# Patient Record
Sex: Male | Born: 1965 | Race: Black or African American | Hispanic: No | Marital: Married | Smoking: Never smoker
Health system: Southern US, Community
[De-identification: ages and names within clinical notes are randomized; demographics above are authoritative.]

## PROBLEM LIST (undated history)

## (undated) DIAGNOSIS — T8859XA Other complications of anesthesia, initial encounter: Secondary | ICD-10-CM

## (undated) DIAGNOSIS — R011 Cardiac murmur, unspecified: Secondary | ICD-10-CM

## (undated) DIAGNOSIS — E1052 Type 1 diabetes mellitus with diabetic peripheral angiopathy with gangrene: Secondary | ICD-10-CM

## (undated) DIAGNOSIS — I10 Essential (primary) hypertension: Secondary | ICD-10-CM

## (undated) DIAGNOSIS — E119 Type 2 diabetes mellitus without complications: Secondary | ICD-10-CM

## (undated) DIAGNOSIS — T4145XA Adverse effect of unspecified anesthetic, initial encounter: Secondary | ICD-10-CM

## (undated) DIAGNOSIS — T8781 Dehiscence of amputation stump: Secondary | ICD-10-CM

## (undated) HISTORY — DX: Type 1 diabetes mellitus with diabetic peripheral angiopathy with gangrene: E10.52

---

## 2012-09-16 ENCOUNTER — Encounter (HOSPITAL_COMMUNITY): Payer: Self-pay | Admitting: *Deleted

## 2012-09-16 ENCOUNTER — Emergency Department (HOSPITAL_COMMUNITY)
Admission: EM | Admit: 2012-09-16 | Discharge: 2012-09-16 | Disposition: A | Discharge status: Home / Self Care | Payer: Self-pay | Attending: Emergency Medicine | Admitting: Emergency Medicine

## 2012-09-16 DIAGNOSIS — Z794 Long term (current) use of insulin: Secondary | ICD-10-CM | POA: Insufficient documentation

## 2012-09-16 DIAGNOSIS — E1169 Type 2 diabetes mellitus with other specified complication: Secondary | ICD-10-CM | POA: Insufficient documentation

## 2012-09-16 DIAGNOSIS — E162 Hypoglycemia, unspecified: Secondary | ICD-10-CM

## 2012-09-16 HISTORY — DX: Type 2 diabetes mellitus without complications: E11.9

## 2012-09-16 LAB — GLUCOSE, CAPILLARY
Glucose-Capillary: 92 mg/dL (ref 70–99)
Glucose-Capillary: 123 mg/dL — ABNORMAL HIGH (ref 70–99)

## 2012-09-16 MED ORDER — DEXTROSE 50 % IV SOLN
50.0000 mL | Freq: Once | INTRAVENOUS | Status: AC
  Administered 2012-09-16: 50 mL via INTRAVENOUS

## 2012-09-16 MED ORDER — DEXTROSE 50 % IV SOLN
INTRAVENOUS | Status: AC
  Administered 2012-09-16: 50 mL via INTRAVENOUS
  Filled 2012-09-16: qty 50

## 2012-09-16 NOTE — ED Provider Notes (Signed)
CSN: [id number]     Arrival date & time 09/16/12  1003 History  This chart was scribed for Ward Givens, MD,  by Ashley Jacobs, ED Scribe. The patient was seen in room APA02/APA02 and the patient's care was started at 11:13 AM   First MD Initiated Contact with Patient 09/16/12 1049     Chief Complaint  Patient presents with  . Hypoglycemia   (Consider location/radiation/quality/duration/timing/severity/associated sxs/prior Treatment) HPI. HPI Comments: Gordon Walker is a 47 y.o. male who presents to the Emergency Department complaining of hypoglycemia while at work today aftering being brought in by coworker. While at work pt states that his friend thought the pt seemed confused and he would only answer yes or no questions. Prior to going to work this morning he reports eating well and denies having any nausea, vomiting, diarrhea, or diaphoresis. He denies any recent changes in his appetite or having any other increase exertion. Today is his second day working at [employer] after spending 1.5 months unemployed. Pt's wife explains that the pt had a CBG of 90 two days ago which was resolved with eating. He denies drinking alcohol.  Pt has diabetes since he was 47 yo.    Pt's PCP is Dr. Lerry Liner, [address] [place] (Pt lives in [place], works in [employer])  Past Medical History  Diagnosis Date  . Diabetes mellitus without complication    History reviewed. No pertinent past surgical history. No family history on file. History  Substance Use Topics  . Smoking status: Never Smoker   . Smokeless tobacco: Not on file  . Alcohol Use: Not on file  employed  Review of Systems  Constitutional: Negative for fever, diaphoresis and appetite change.  Gastrointestinal: Negative for nausea, vomiting and diarrhea.  All other systems reviewed and are negative.    Allergies  Review of patient's allergies indicates no known allergies.  Home Medications   Current  Outpatient Rx  Name  Route  Sig  Dispense  Refill  . insulin glargine (LANTUS) 100 UNIT/ML injection   Subcutaneous   Inject 15-20 Units into the skin at bedtime. Sliding scale         . insulin NPH-regular (NOVOLIN 70/30) (70-30) 100 UNIT/ML injection   Subcutaneous   Inject 15-20 Units into the skin 2 (two) times daily with a meal. Patient thinks he used 17 units this morning         . Multiple Vitamin (MULTIVITAMIN WITH MINERALS) TABS tablet   Oral   Take 1 tablet by mouth daily.          BP 164/89  Pulse 78  Resp 14  SpO2 100%  Vital signs normal   Physical Exam  Nursing note and vitals reviewed. Constitutional: He is oriented to person, place, and time. He appears well-developed and well-nourished.  Non-toxic appearance. He does not appear ill. No distress.  HENT:  Head: Normocephalic and atraumatic.  Right Ear: External ear normal.  Left Ear: External ear normal.  Nose: Nose normal. No mucosal edema or rhinorrhea.  Mouth/Throat: Oropharynx is clear and moist and mucous membranes are normal. No dental abscesses or edematous.  Eyes: Conjunctivae and EOM are normal. Pupils are equal, round, and reactive to light.  Neck: Normal range of motion and full passive range of motion without pain. Neck supple.  Cardiovascular: Normal rate, regular rhythm and normal heart sounds.  Exam reveals no gallop and no friction rub.   No murmur heard. Pulmonary/Chest: Effort normal and  breath sounds normal. No respiratory distress. He has no rhonchi. He exhibits no crepitus.  Abdominal: Normal appearance. There is no rebound and no guarding.  Musculoskeletal: Normal range of motion. He exhibits no edema and no tenderness.  Moves all extremities well.   Neurological: He is alert and oriented to person, place, and time. He has normal strength. No cranial nerve deficit.  Skin: Skin is warm, dry and intact. No rash noted. No erythema. No pallor.  Psychiatric: He has a normal mood and  affect. His speech is normal and behavior is normal. His mood appears not anxious.    ED Course  Procedures (including critical care time) DIAGNOSTIC STUDIES: Oxygen Saturation is 100% on room air, normal by my interpretation.    COORDINATION OF CARE: 11:16 AM Discussed course of care with pt . Pt understands and agrees.  Pt ate minimal breakfast here, his repeat CBG was 92, wife brought him food and his CBG is now 123. He states he is feeling fine now.    Results for orders placed during the hospital encounter of 09/16/12  GLUCOSE, CAPILLARY      Result Value Range   Glucose-Capillary 92  70 - 99 mg/dL  GLUCOSE, CAPILLARY      Result Value Range   Glucose-Capillary 29 (*) 70 - 99 mg/dL   Comment 1 Orig Pt Id entered as [id number]    GLUCOSE, CAPILLARY      Result Value Range   Glucose-Capillary 123 (*) 70 - 99 mg/dL   Comment 1 Documented in Chart     Laboratory interpretation all normal except initial low glucose then normal then mildly elevated    MDM  patient reports she just started back to work this week after being off for 6 weeks. He reports rarely his blood sugar is low. More likely the increased activity has decreased his insulin need. Patient is being released to closely monitor his CBG to get it stabilized with his new level of activity while returning to work.    1. Hypoglycemia     Plan discharge   Devoria Albe, MD, FACEP   I personally performed the services described in this documentation, which was scribed in my presence. The recorded information has been reviewed and considered.  Devoria Albe, MD, Armando Gang      Ward Givens, MD 09/16/12 1245

## 2012-09-16 NOTE — ED Notes (Signed)
Reports took Novolog 70/30, 20 units and ate a "light breakfast."

## 2012-09-16 NOTE — ED Notes (Addendum)
Pt given discharge instructions and verbalized understanding. NAD at this time. Vitals reviewed and are WNL.

## 2012-09-16 NOTE — ED Notes (Signed)
Pt given breakfast tray and ate cereal only.  Encouraged pt to eat a little more.  Pt's wife will bring pt lunch.

## 2012-09-16 NOTE — ED Notes (Signed)
Pt brought to er by friend with c/o low blood sugar, CBG at triage 29, pt unable to answer questions, will only say yes to questions asked, pt clammy,

## 2015-01-05 ENCOUNTER — Inpatient Hospital Stay (HOSPITAL_COMMUNITY)
Admission: EM | Admit: 2015-01-05 | Discharge: 2015-01-10 | DRG: 617 | Disposition: A | Discharge status: Home / Self Care | Payer: BLUE CROSS/BLUE SHIELD | Attending: Internal Medicine | Admitting: Internal Medicine

## 2015-01-05 ENCOUNTER — Emergency Department (HOSPITAL_COMMUNITY): Payer: BLUE CROSS/BLUE SHIELD

## 2015-01-05 ENCOUNTER — Encounter (HOSPITAL_COMMUNITY): Payer: Self-pay | Admitting: Emergency Medicine

## 2015-01-05 DIAGNOSIS — E10621 Type 1 diabetes mellitus with foot ulcer: Secondary | ICD-10-CM | POA: Diagnosis present

## 2015-01-05 DIAGNOSIS — IMO0002 Reserved for concepts with insufficient information to code with codable children: Secondary | ICD-10-CM

## 2015-01-05 DIAGNOSIS — Z794 Long term (current) use of insulin: Secondary | ICD-10-CM | POA: Diagnosis not present

## 2015-01-05 DIAGNOSIS — R509 Fever, unspecified: Secondary | ICD-10-CM | POA: Diagnosis not present

## 2015-01-05 DIAGNOSIS — E11621 Type 2 diabetes mellitus with foot ulcer: Secondary | ICD-10-CM

## 2015-01-05 DIAGNOSIS — M86172 Other acute osteomyelitis, left ankle and foot: Secondary | ICD-10-CM | POA: Diagnosis present

## 2015-01-05 DIAGNOSIS — D649 Anemia, unspecified: Secondary | ICD-10-CM | POA: Diagnosis present

## 2015-01-05 DIAGNOSIS — E1065 Type 1 diabetes mellitus with hyperglycemia: Secondary | ICD-10-CM | POA: Diagnosis present

## 2015-01-05 DIAGNOSIS — L97529 Non-pressure chronic ulcer of other part of left foot with unspecified severity: Secondary | ICD-10-CM | POA: Diagnosis present

## 2015-01-05 DIAGNOSIS — L02612 Cutaneous abscess of left foot: Secondary | ICD-10-CM | POA: Diagnosis present

## 2015-01-05 DIAGNOSIS — E1051 Type 1 diabetes mellitus with diabetic peripheral angiopathy without gangrene: Secondary | ICD-10-CM | POA: Diagnosis present

## 2015-01-05 DIAGNOSIS — D72829 Elevated white blood cell count, unspecified: Secondary | ICD-10-CM | POA: Diagnosis present

## 2015-01-05 DIAGNOSIS — R739 Hyperglycemia, unspecified: Secondary | ICD-10-CM

## 2015-01-05 DIAGNOSIS — L97509 Non-pressure chronic ulcer of other part of unspecified foot with unspecified severity: Secondary | ICD-10-CM

## 2015-01-05 DIAGNOSIS — E1069 Type 1 diabetes mellitus with other specified complication: Secondary | ICD-10-CM | POA: Diagnosis present

## 2015-01-05 DIAGNOSIS — R740 Nonspecific elevation of levels of transaminase and lactic acid dehydrogenase [LDH]: Secondary | ICD-10-CM | POA: Diagnosis present

## 2015-01-05 DIAGNOSIS — E1042 Type 1 diabetes mellitus with diabetic polyneuropathy: Secondary | ICD-10-CM | POA: Diagnosis present

## 2015-01-05 DIAGNOSIS — R7401 Elevation of levels of liver transaminase levels: Secondary | ICD-10-CM | POA: Diagnosis not present

## 2015-01-05 DIAGNOSIS — E1165 Type 2 diabetes mellitus with hyperglycemia: Secondary | ICD-10-CM

## 2015-01-05 DIAGNOSIS — R944 Abnormal results of kidney function studies: Secondary | ICD-10-CM | POA: Diagnosis present

## 2015-01-05 DIAGNOSIS — M869 Osteomyelitis, unspecified: Secondary | ICD-10-CM

## 2015-01-05 DIAGNOSIS — E08621 Diabetes mellitus due to underlying condition with foot ulcer: Secondary | ICD-10-CM | POA: Diagnosis not present

## 2015-01-05 DIAGNOSIS — L089 Local infection of the skin and subcutaneous tissue, unspecified: Secondary | ICD-10-CM | POA: Diagnosis present

## 2015-01-05 DIAGNOSIS — D6489 Other specified anemias: Secondary | ICD-10-CM | POA: Diagnosis not present

## 2015-01-05 LAB — BASIC METABOLIC PANEL
ANION GAP: 11 (ref 5–15)
BUN: 21 mg/dL — ABNORMAL HIGH (ref 6–20)
CALCIUM: 8.4 mg/dL — AB (ref 8.9–10.3)
CO2: 27 mmol/L (ref 22–32)
Chloride: 89 mmol/L — ABNORMAL LOW (ref 101–111)
Creatinine, Ser: 1.46 mg/dL — ABNORMAL HIGH (ref 0.61–1.24)
GFR, EST NON AFRICAN AMERICAN: 56 mL/min — AB (ref >60)
Glucose, Bld: 552 mg/dL (ref 65–99)
Potassium: 4.7 mmol/L (ref 3.5–5.1)
SODIUM: 127 mmol/L — AB (ref 135–145)

## 2015-01-05 LAB — CBC WITH DIFFERENTIAL/PLATELET
Basophils Absolute: 0 10*3/uL (ref 0.0–0.1)
Basophils Relative: 0 %
EOS PCT: 0 %
Eosinophils Absolute: 0 10*3/uL (ref 0.0–0.7)
HCT: 31.3 % — ABNORMAL LOW (ref 39.0–52.0)
Hemoglobin: 10.8 g/dL — ABNORMAL LOW (ref 13.0–17.0)
LYMPHS ABS: 1 10*3/uL (ref 0.7–4.0)
LYMPHS PCT: 7 %
MCH: 28.4 pg (ref 26.0–34.0)
MCHC: 34.5 g/dL (ref 30.0–36.0)
MCV: 82.4 fL (ref 78.0–100.0)
MONO ABS: 1.9 10*3/uL — AB (ref 0.1–1.0)
Monocytes Relative: 12 %
Neutro Abs: 12.4 10*3/uL — ABNORMAL HIGH (ref 1.7–7.7)
Neutrophils Relative %: 81 %
PLATELETS: 332 10*3/uL (ref 150–400)
RBC: 3.8 MIL/uL — AB (ref 4.22–5.81)
RDW: 11.9 % (ref 11.5–15.5)
WBC: 15.3 10*3/uL — AB (ref 4.0–10.5)

## 2015-01-05 LAB — I-STAT CHEM 8, ED
BUN: 24 mg/dL — ABNORMAL HIGH (ref 6–20)
Calcium, Ion: 1.12 mmol/L (ref 1.12–1.23)
Chloride: 89 mmol/L — ABNORMAL LOW (ref 101–111)
Creatinine, Ser: 1.1 mg/dL (ref 0.61–1.24)
GLUCOSE: 548 mg/dL — AB (ref 65–99)
HEMATOCRIT: 35 % — AB (ref 39.0–52.0)
HEMOGLOBIN: 11.9 g/dL — AB (ref 13.0–17.0)
POTASSIUM: 4.6 mmol/L (ref 3.5–5.1)
Sodium: 129 mmol/L — ABNORMAL LOW (ref 135–145)
TCO2: 29 mmol/L (ref 0–100)

## 2015-01-05 LAB — I-STAT CG4 LACTIC ACID, ED
LACTIC ACID, VENOUS: 1.4 mmol/L (ref 0.5–2.0)
LACTIC ACID, VENOUS: 1 mmol/L (ref 0.5–2.0)

## 2015-01-05 MED ORDER — VANCOMYCIN HCL IN DEXTROSE 1-5 GM/200ML-% IV SOLN
1000.0000 mg | Freq: Once | INTRAVENOUS | Status: AC
  Administered 2015-01-05: 1000 mg via INTRAVENOUS
  Filled 2015-01-05: qty 200

## 2015-01-05 MED ORDER — INSULIN ASPART 100 UNIT/ML ~~LOC~~ SOLN
5.0000 [IU] | Freq: Once | SUBCUTANEOUS | Status: AC
  Administered 2015-01-05: 5 [IU] via SUBCUTANEOUS
  Filled 2015-01-05: qty 1

## 2015-01-05 MED ORDER — PIPERACILLIN-TAZOBACTAM 3.375 G IVPB 30 MIN
3.3750 g | Freq: Once | INTRAVENOUS | Status: AC
  Administered 2015-01-05: 3.375 g via INTRAVENOUS
  Filled 2015-01-05: qty 50

## 2015-01-05 NOTE — ED Notes (Signed)
Per EMS- pt here from Urgent Care. Pt was seen on the 5th for toe infection to left great toe. Redness and swelling to foot. Pt was told to soak his foot but returned today for worsening symptoms. Pt had a fever of 102.9 and was given Motrin 800mg  at UC. Pt CBG was 180.

## 2015-01-05 NOTE — ED Provider Notes (Signed)
CSN: [id number]     Arrival date & time 01/05/15  1910 History   First MD Initiated Contact with Patient 01/05/15 2005     Chief Complaint  Patient presents with  . toe infection    Patient gave verbal permission to utilize photo for medical documentation only The image was not stored on any personal device   Patient is a [age] y.o. male presenting with lower extremity pain. The history is provided by the patient and a relative.  Foot Pain This is a new problem. The current episode started more than 2 days ago. The problem occurs daily. The problem has been gradually worsening. Pertinent negatives include no chest pain, no abdominal pain and no shortness of breath. The symptoms are aggravated by walking. The symptoms are relieved by rest.  pt reports left foot/great toe pain for several weeks He does not recall any recent injury but reports he is diabetic He reports fever/chills (Tmax 103 per family) He reports pain/redness to left foot He is not on antibiotics He reports he was seen at an urgent care and today and sent for evaluation  Past Medical History  Diagnosis Date  . Diabetes mellitus without complication (HCC)    No past surgical history on file. No family history on file. Social History  Substance Use Topics  . Smoking status: Never Smoker   . Smokeless tobacco: None  . Alcohol Use: None    Review of Systems  Constitutional: Positive for fever and chills.  Respiratory: Negative for shortness of breath.   Cardiovascular: Negative for chest pain.  Gastrointestinal: Positive for nausea. Negative for abdominal pain.  Skin: Positive for wound.  All other systems reviewed and are negative.     Allergies  Review of patient's allergies indicates no known allergies.  Home Medications   Prior to Admission medications   Medication Sig Start Date End Date Taking? Authorizing Provider  insulin glargine (LANTUS) 100 UNIT/ML injection Inject 30 Units into the skin at  bedtime. Sliding scale   Yes Historical Provider, MD  insulin lispro (HUMALOG) 100 UNIT/ML injection Inject 5-7 Units into the skin See admin instructions. Takes with largest meal   Yes Historical Provider, MD  Multiple Vitamin (MULTIVITAMIN WITH MINERALS) TABS tablet Take 1 tablet by mouth daily.   Yes Historical Provider, MD   BP 169/89 mmHg  Pulse 85  Temp(Src) 98.8 F (37.1 C) (Oral)  Resp 15  SpO2 96% Physical Exam CONSTITUTIONAL: Well developed/well nourished HEAD: Normocephalic/atraumatic EYES: EOMI ENMT: Mucous membranes moist NECK: supple no meningeal signs CV: S1/S2 noted, no murmurs/rubs/gallops noted LUNGS: Lungs are clear to auscultation bilaterally, no apparent distress ABDOMEN: soft, nontender, no rebound or guarding GU:no cva tenderness NEURO: Pt is awake/alert/appropriate, moves all extremitiesx4.  No facial droop.   EXTREMITIES: pulses normal/equal, full ROM, see photo.  No crepitus noted SKIN: warm, color normal, see photos PSYCH: no abnormalities of mood noted, alert and oriented to situation      ED Course  Procedures  9:22 PM Pt with obvious infection He will need admission Xray pending at this time IV antibiotics have been ordered 10:19 PM I spoke to OR nurse for dr dean and he is aware of patient Will need medical admission 11:03 PM  D/w dr Robb Matarortiz Will admit Pt will need seen by orthopedics IV antibiotics have been given Pt stable and not septic appearing at this time Labs Review Labs Reviewed  CBC WITH DIFFERENTIAL/PLATELET - Abnormal; Notable for the following:    WBC 15.3 (*)  RBC 3.80 (*)    Hemoglobin 10.8 (*)    HCT 31.3 (*)    Neutro Abs 12.4 (*)    Monocytes Absolute 1.9 (*)    All other components within normal limits  BASIC METABOLIC PANEL - Abnormal; Notable for the following:    Sodium 127 (*)    Chloride 89 (*)    Glucose, Bld 552 (*)    BUN 21 (*)    Creatinine, Ser 1.46 (*)    Calcium 8.4 (*)    GFR calc non Af Amer  56 (*)    All other components within normal limits  I-STAT CHEM 8, ED - Abnormal; Notable for the following:    Sodium 129 (*)    Chloride 89 (*)    BUN 24 (*)    Glucose, Bld 548 (*)    Hemoglobin 11.9 (*)    HCT 35.0 (*)    All other components within normal limits  I-STAT CG4 LACTIC ACID, ED  I-STAT CG4 LACTIC ACID, ED    Imaging Review Dg Foot Complete Left  01/05/2015  CLINICAL DATA:  Left great toe infection.  Redness and swelling. EXAM: LEFT FOOT - COMPLETE 3+ VIEW COMPARISON:  None. FINDINGS: Soft tissue irregularity and gas about the first phalanx with diffuse soft tissue swelling. Vascular calcifications. Lucency through the proximal aspect of the distal phalanx of the great toe.absence of the medial portion the tuft of the great toe. Vascular calcifications. IMPRESSION: Soft tissue swelling with gas about the great toe, suspicious for infection, possibly with gas producing organism. Absence of the medial aspect of the tuft is suspicious for osteomyelitis. Lucency through the proximal portion of the distal phalanx could represent an atypical appearance of osteomyelitis or subacute fracture. Electronically Signed   By: Jeronimo Greaves M.D.   On: 01/05/2015 21:20   I have personally reviewed and evaluated these images and lab results as part of my medical decision-making.    MDM   Final diagnoses:  Acute osteomyelitis of left foot (HCC)  Hyperglycemia    Nursing notes including past medical history and social history reviewed and considered in documentation Labs/vital reviewed myself and considered during evaluation xrays/imaging reviewed by myself and considered during evaluation     Zadie Rhine, MD 01/05/15 2304

## 2015-01-05 NOTE — Progress Notes (Signed)
Great toe osteomyelitis xrays reviewed i will ask dr duda to eval in am Pt stable by report

## 2015-01-05 NOTE — ED Notes (Signed)
Lab callled, needs new green top (BMP drawal)

## 2015-01-05 NOTE — ED Notes (Signed)
Family at bedside. 

## 2015-01-06 ENCOUNTER — Inpatient Hospital Stay (HOSPITAL_COMMUNITY): Payer: BLUE CROSS/BLUE SHIELD

## 2015-01-06 ENCOUNTER — Other Ambulatory Visit (HOSPITAL_COMMUNITY): Payer: Self-pay | Admitting: Orthopedic Surgery

## 2015-01-06 ENCOUNTER — Encounter (HOSPITAL_COMMUNITY): Payer: Self-pay | Admitting: Internal Medicine

## 2015-01-06 DIAGNOSIS — D649 Anemia, unspecified: Secondary | ICD-10-CM | POA: Diagnosis present

## 2015-01-06 DIAGNOSIS — E1165 Type 2 diabetes mellitus with hyperglycemia: Secondary | ICD-10-CM

## 2015-01-06 DIAGNOSIS — M86172 Other acute osteomyelitis, left ankle and foot: Secondary | ICD-10-CM

## 2015-01-06 DIAGNOSIS — L97509 Non-pressure chronic ulcer of other part of unspecified foot with unspecified severity: Secondary | ICD-10-CM

## 2015-01-06 DIAGNOSIS — M869 Osteomyelitis, unspecified: Secondary | ICD-10-CM | POA: Diagnosis present

## 2015-01-06 DIAGNOSIS — Z794 Long term (current) use of insulin: Secondary | ICD-10-CM

## 2015-01-06 DIAGNOSIS — E11621 Type 2 diabetes mellitus with foot ulcer: Secondary | ICD-10-CM

## 2015-01-06 DIAGNOSIS — IMO0002 Reserved for concepts with insufficient information to code with codable children: Secondary | ICD-10-CM

## 2015-01-06 LAB — GLUCOSE, CAPILLARY
GLUCOSE-CAPILLARY: 194 mg/dL — AB (ref 65–99)
GLUCOSE-CAPILLARY: 203 mg/dL — AB (ref 65–99)
GLUCOSE-CAPILLARY: 251 mg/dL — AB (ref 65–99)
GLUCOSE-CAPILLARY: 311 mg/dL — AB (ref 65–99)
GLUCOSE-CAPILLARY: 466 mg/dL — AB (ref 65–99)
Glucose-Capillary: 253 mg/dL — ABNORMAL HIGH (ref 65–99)

## 2015-01-06 LAB — CBC WITH DIFFERENTIAL/PLATELET
BASOS ABS: 0 10*3/uL (ref 0.0–0.1)
Basophils Relative: 0 %
Eosinophils Absolute: 0.1 10*3/uL (ref 0.0–0.7)
Eosinophils Relative: 1 %
HEMATOCRIT: 31.5 % — AB (ref 39.0–52.0)
HEMOGLOBIN: 10.6 g/dL — AB (ref 13.0–17.0)
LYMPHS PCT: 10 %
Lymphs Abs: 1.2 10*3/uL (ref 0.7–4.0)
MCH: 28 pg (ref 26.0–34.0)
MCHC: 33.7 g/dL (ref 30.0–36.0)
MCV: 83.3 fL (ref 78.0–100.0)
MONO ABS: 1.1 10*3/uL — AB (ref 0.1–1.0)
MONOS PCT: 9 %
NEUTROS ABS: 9.3 10*3/uL — AB (ref 1.7–7.7)
NEUTROS PCT: 80 %
Platelets: 287 10*3/uL (ref 150–400)
RBC: 3.78 MIL/uL — ABNORMAL LOW (ref 4.22–5.81)
RDW: 11.7 % (ref 11.5–15.5)
WBC: 11.7 10*3/uL — ABNORMAL HIGH (ref 4.0–10.5)

## 2015-01-06 LAB — MAGNESIUM: MAGNESIUM: 2.2 mg/dL (ref 1.7–2.4)

## 2015-01-06 LAB — COMPREHENSIVE METABOLIC PANEL
ALBUMIN: 2.1 g/dL — AB (ref 3.5–5.0)
ALT: 32 U/L (ref 17–63)
ANION GAP: 6 (ref 5–15)
AST: 33 U/L (ref 15–41)
Alkaline Phosphatase: 146 U/L — ABNORMAL HIGH (ref 38–126)
BUN: 12 mg/dL (ref 6–20)
CO2: 31 mmol/L (ref 22–32)
Calcium: 8.4 mg/dL — ABNORMAL LOW (ref 8.9–10.3)
Chloride: 101 mmol/L (ref 101–111)
Creatinine, Ser: 1.01 mg/dL (ref 0.61–1.24)
GFR calc non Af Amer: >60 mL/min (ref >60)
GLUCOSE: 198 mg/dL — AB (ref 65–99)
POTASSIUM: 4.2 mmol/L (ref 3.5–5.1)
SODIUM: 138 mmol/L (ref 135–145)
TOTAL PROTEIN: 6.1 g/dL — AB (ref 6.5–8.1)
Total Bilirubin: 0.8 mg/dL (ref 0.3–1.2)

## 2015-01-06 LAB — PHOSPHORUS: PHOSPHORUS: 2.9 mg/dL (ref 2.5–4.6)

## 2015-01-06 MED ORDER — OXYCODONE HCL 5 MG PO TABS
5.0000 mg | ORAL_TABLET | ORAL | Status: DC | PRN
  Administered 2015-01-06 – 2015-01-09 (×7): 5 mg via ORAL
  Filled 2015-01-06 (×9): qty 1

## 2015-01-06 MED ORDER — ONDANSETRON HCL 4 MG PO TABS: 4.0000 mg | ORAL_TABLET | Freq: Four times a day (QID) | ORAL | Status: DC | PRN

## 2015-01-06 MED ORDER — ENOXAPARIN SODIUM 40 MG/0.4ML ~~LOC~~ SOLN
40.0000 mg | Freq: Every day | SUBCUTANEOUS | Status: DC
  Administered 2015-01-06 – 2015-01-09 (×4): 40 mg via SUBCUTANEOUS
  Filled 2015-01-06 (×4): qty 0.4

## 2015-01-06 MED ORDER — SODIUM CHLORIDE 0.9 % IV SOLN
INTRAVENOUS | Status: AC
  Administered 2015-01-06 (×2): via INTRAVENOUS

## 2015-01-06 MED ORDER — ONDANSETRON HCL 4 MG/2ML IJ SOLN: 4.0000 mg | Freq: Four times a day (QID) | INTRAMUSCULAR | Status: DC | PRN

## 2015-01-06 MED ORDER — ADULT MULTIVITAMIN W/MINERALS CH
1.0000 | ORAL_TABLET | Freq: Every day | ORAL | Status: DC
  Administered 2015-01-06 – 2015-01-10 (×4): 1 via ORAL
  Filled 2015-01-06 (×4): qty 1

## 2015-01-06 MED ORDER — PIPERACILLIN-TAZOBACTAM 3.375 G IVPB 30 MIN: 3.3750 g | Freq: Three times a day (TID) | INTRAVENOUS | Status: DC

## 2015-01-06 MED ORDER — ACETAMINOPHEN 325 MG PO TABS
650.0000 mg | ORAL_TABLET | Freq: Four times a day (QID) | ORAL | Status: DC | PRN
  Administered 2015-01-08 (×2): 650 mg via ORAL
  Filled 2015-01-06 (×2): qty 2

## 2015-01-06 MED ORDER — INSULIN ASPART 100 UNIT/ML ~~LOC~~ SOLN
10.0000 [IU] | Freq: Once | SUBCUTANEOUS | Status: AC
  Administered 2015-01-06: 10 [IU] via SUBCUTANEOUS

## 2015-01-06 MED ORDER — SODIUM CHLORIDE 0.9 % IV BOLUS (SEPSIS)
1000.0000 mL | Freq: Once | INTRAVENOUS | Status: AC
  Administered 2015-01-06: 1000 mL via INTRAVENOUS

## 2015-01-06 MED ORDER — VANCOMYCIN HCL IN DEXTROSE 1-5 GM/200ML-% IV SOLN
1000.0000 mg | Freq: Two times a day (BID) | INTRAVENOUS | Status: DC
  Administered 2015-01-06 – 2015-01-07 (×4): 1000 mg via INTRAVENOUS
  Filled 2015-01-06 (×4): qty 200

## 2015-01-06 MED ORDER — PIPERACILLIN-TAZOBACTAM 3.375 G IVPB
3.3750 g | Freq: Three times a day (TID) | INTRAVENOUS | Status: DC
  Administered 2015-01-06 – 2015-01-10 (×14): 3.375 g via INTRAVENOUS
  Filled 2015-01-06 (×15): qty 50

## 2015-01-06 MED ORDER — KETOROLAC TROMETHAMINE 30 MG/ML IJ SOLN
30.0000 mg | Freq: Three times a day (TID) | INTRAMUSCULAR | Status: DC | PRN
  Administered 2015-01-06: 30 mg via INTRAVENOUS
  Filled 2015-01-06: qty 1

## 2015-01-06 MED ORDER — INSULIN GLARGINE 100 UNIT/ML ~~LOC~~ SOLN
30.0000 [IU] | Freq: Every day | SUBCUTANEOUS | Status: DC
Start: 2015-01-06 — End: 2015-01-09
  Administered 2015-01-06 – 2015-01-08 (×4): 30 [IU] via SUBCUTANEOUS
  Filled 2015-01-06 (×6): qty 0.3

## 2015-01-06 MED ORDER — INSULIN ASPART 100 UNIT/ML ~~LOC~~ SOLN
0.0000 [IU] | Freq: Three times a day (TID) | SUBCUTANEOUS | Status: DC
  Administered 2015-01-06: 8 [IU] via SUBCUTANEOUS
  Administered 2015-01-06: 11 [IU] via SUBCUTANEOUS
  Administered 2015-01-06: 5 [IU] via SUBCUTANEOUS
  Administered 2015-01-07 (×2): 3 [IU] via SUBCUTANEOUS
  Administered 2015-01-07: 2 [IU] via SUBCUTANEOUS
  Administered 2015-01-08: 3 [IU] via SUBCUTANEOUS
  Administered 2015-01-08 (×2): 5 [IU] via SUBCUTANEOUS
  Administered 2015-01-09 (×2): 11 [IU] via SUBCUTANEOUS
  Administered 2015-01-09: 8 [IU] via SUBCUTANEOUS

## 2015-01-06 MED ORDER — PANTOPRAZOLE SODIUM 40 MG PO TBEC
40.0000 mg | DELAYED_RELEASE_TABLET | Freq: Every day | ORAL | Status: DC
  Administered 2015-01-06 – 2015-01-10 (×4): 40 mg via ORAL
  Filled 2015-01-06 (×6): qty 1

## 2015-01-06 MED ORDER — ACETAMINOPHEN 650 MG RE SUPP
650.0000 mg | Freq: Four times a day (QID) | RECTAL | Status: DC | PRN
Start: 2015-01-06 — End: 2015-01-10

## 2015-01-06 MED ORDER — PRO-STAT SUGAR FREE PO LIQD
30.0000 mL | Freq: Every day | ORAL | Status: DC
  Administered 2015-01-06 – 2015-01-10 (×4): 30 mL via ORAL
  Filled 2015-01-06 (×4): qty 30

## 2015-01-06 NOTE — Care Management Note (Signed)
Case Management Note  Patient Details  Name: Gordon Walker MRN: [id number] Date of Birth: 09-Mar-1965  Subjective/Objective:                    Action/Plan:  Will await post op evals Expected Discharge Date:                  Expected Discharge Plan:     In-House Referral:     Discharge planning Services     Post Acute Care Choice:    Choice offered to:     DME Arranged:    DME Agency:     HH Arranged:    HH Agency:     Status of Service:  In process, will continue to follow  Medicare Important Message Given:    Date Medicare IM Given:    Medicare IM give by:    Date Additional Medicare IM Given:    Additional Medicare Important Message give by:     If discussed at Long Length of Stay Meetings, dates discussed:    Additional Comments:  [name], [name], RN 01/06/2015, 10:51 AM

## 2015-01-06 NOTE — Progress Notes (Signed)
[facility] PROGRESS NOTE  Charletta CousinFrederick Botero ZOX:[id number] DOB: [birth year] DOA: 01/05/2015  PCP: [name], MD  Brief HPI: 49 year old African-American male with a past medical history of type 1 diabetes presented with complains of pain in his left foot, but clearly the left first toe. He was noted to have infection with suspicion for osteomyelitis on imaging studies. He was hospitalized for further management.  Past medical history:  Past Medical History  Diagnosis Date  . Diabetes mellitus without complication (HCC)     Consultants: Orthopedics  Procedures: None  Antibiotics: Vancomycin and Zosyn  Subjective: Patient states that the pain in his left foot is reasonably well controlled. He denies any nausea, vomiting currently. Did have a few episodes of vomiting yesterday.   Objective: Vital Signs  Filed Vitals:   01/05/15 2300 01/05/15 2345 01/06/15 0011 01/06/15 0506  BP: 155/90 140/80 142/81 134/81  Pulse: 88 85 89 75  Temp:   98.4 F (36.9 C) 97.4 F (36.3 C)  TempSrc:   Oral Oral  Resp: 23 23 20 18   Height:   6' (1.829 m)   Weight:   74.571 kg (164 lb 6.4 oz)   SpO2: 96% 98% 99% 100%    Intake/Output Summary (Last 24 hours) at 01/06/15 1307 Last data filed at 01/06/15 [id number]  Gross per 24 hour  Intake 1018.33 ml  Output    800 ml  Net 218.33 ml   Filed Weights   01/06/15 0011  Weight: 74.571 kg (164 lb 6.4 oz)    General appearance: alert, cooperative, appears stated age and no distress Resp: clear to auscultation bilaterally Cardio: regular rate and rhythm, S1, S2 normal, no murmur, click, rub or gallop GI: soft, non-tender; bowel sounds normal; no masses,  no organomegaly Extremities: Erythema noted over the dorsal aspect of the left foot. Left first toe is a mottled appearance with yellowish exudate. Foul-smelling. Tender to palpation. Pulses are poorly palpable. Neurologic: Alert and oriented 3. No focal neurological deficits are  noted.  Lab Results:  Basic Metabolic Panel:  Recent Labs Lab 01/05/15 2153 01/05/15 2228 01/06/15 0838  NA 127* 129* 138  K 4.7 4.6 4.2  CL 89* 89* 101  CO2 27  --  31  GLUCOSE 552* 548* 198*  BUN 21* 24* 12  CREATININE 1.46* 1.10 1.01  CALCIUM 8.4*  --  8.4*  MG 2.2  --   --   PHOS 2.9  --   --    Liver Function Tests:  Recent Labs Lab 01/06/15 0838  AST 33  ALT 32  ALKPHOS 146*  BILITOT 0.8  PROT 6.1*  ALBUMIN 2.1*   CBC:  Recent Labs Lab 01/05/15 1938 01/05/15 2228 01/06/15 0838  WBC 15.3*  --  11.7*  NEUTROABS 12.4*  --  9.3*  HGB 10.8* 11.9* 10.6*  HCT 31.3* 35.0* 31.5*  MCV 82.4  --  83.3  PLT 332  --  287    CBG:  Recent Labs Lab 01/06/15 0033 01/06/15 0459 01/06/15 0724 01/06/15 1115  GLUCAP 466* 253* 203* 251*    No results found for this or any previous visit (from the past 240 hour(s)).    Studies/Results: Dg Foot Complete Left  01/05/2015  CLINICAL DATA:  Left great toe infection.  Redness and swelling. EXAM: LEFT FOOT - COMPLETE 3+ VIEW COMPARISON:  None. FINDINGS: Soft tissue irregularity and gas about the first phalanx with diffuse soft tissue swelling. Vascular calcifications. Lucency through the proximal aspect of the distal  phalanx of the great toe.absence of the medial portion the tuft of the great toe. Vascular calcifications. IMPRESSION: Soft tissue swelling with gas about the great toe, suspicious for infection, possibly with gas producing organism. Absence of the medial aspect of the tuft is suspicious for osteomyelitis. Lucency through the proximal portion of the distal phalanx could represent an atypical appearance of osteomyelitis or subacute fracture. Electronically Signed   By: Jeronimo Greaves M.D.   On: 01/05/2015 21:20    Medications:  Scheduled: . enoxaparin (LOVENOX) injection  40 mg Subcutaneous QHS  . insulin aspart  0-15 Units Subcutaneous TID WC  . insulin glargine  30 Units Subcutaneous QHS  . pantoprazole   40 mg Oral Daily  . piperacillin-tazobactam (ZOSYN)  IV  3.375 g Intravenous 3 times per day  . vancomycin  1,000 mg Intravenous Q12H   Continuous: . sodium chloride 100 mL/hr at 01/06/15 0037   ZOX:[id number] **OR** acetaminophen, ketorolac, ondansetron **OR** ondansetron (ZOFRAN) IV, oxyCODONE  Assessment/Plan:  Principal Problem:   Osteomyelitis of left foot (HCC) Active Problems:   Uncontrolled diabetes mellitus with foot ulcer, with long-term current use of insulin (HCC)   Anemia    Infection of the left foot with osteomyelitis involving the left first toe Continue broad-spectrum antibiotics for now. Analgesics as needed. Obtain arterial Dopplers. Orthopedics is seen. Plan is for a ray amputation tomorrow.  Uncontrolled type 1 diabetes with foot ulcer Patient was hyperglycemic in the time of admission. Most likely due to infection. HbA1c is pending. He states that he is compliant with his diabetic regimen. Continue Lantus and sliding scale coverage.  Normocytic anemia Likely secondary to chronic disease. Check anemia panel.  Pseudohyponatremia Secondary to elevated blood glucose levels. Sodium level normal this morning.  DVT Prophylaxis: Lovenox    Code Status: Full code  Family Communication: Discussed with the patient and his wife  Disposition Plan: Continue current treatment. Plan is for surgical intervention tomorrow. Await arterial Dopplers.    LOS: 1 day   [facility]  [facility] Pager [phone] 01/06/2015, 1:07 PM  If 7PM-7AM, please contact night-coverage at [email or web address], password [facility]

## 2015-01-06 NOTE — Progress Notes (Signed)
Initial Nutrition Assessment  DOCUMENTATION CODES:   Not applicable  INTERVENTION:   -30 ml Prostat daily -MVI daily  NUTRITION DIAGNOSIS:   Increased nutrient needs related to wound healing as evidenced by estimated needs.  GOAL:   Patient will meet greater than or equal to 90% of their needs  MONITOR:   PO intake, Supplement acceptance, Labs, Weight trends, Skin, I & O's  REASON FOR ASSESSMENT:   Malnutrition Screening Tool    ASSESSMENT:   Gordon Walker is a 49 y.o. male with a past medical history of type 1 diabetes since age 49 who comes to the ER via EMS referred from an urgent care facility due to the left great toe infection. Per patient, about 2 weeks ago he noticed that he had a small tender area on his great toe, which continued to get progressively worse with development of tenderness, erythema and edema. Per patient, the last 2 days he has been having fever, chills and fatigue at home that he decided to seek medical attention. He denies any type of trauma to the area that he can remember. Workup in the ER reveals mild leukocytosis and a blood glucose of 550 mg/dL  Pt admitted with osteomyelitis of lt foot. Reviewed orthopedics notes, which reveals pt is scheduled for lt great toe amputation on 01/07/15.   Spoke with pt at bedside, who reports poor appetite 3 days PTA due to not feeling. However, prior to this and currently, pt has had a good appetite. He describes that he eats 3 meals per day and consumed all of his breakfast this AM, This statement was confirmed by RN.   Pt reports that he doesn't feel like he has lost weight, but became concerned of admission wt of 164# on admission. He reveals UBW of 174#. Weight verified on bedscale, which yielded 175#.   Pt endorses good glycemic control at home. Reports he check his blood sugar twice a day and generally achieve readings between 150-175. However, he noted that readings have been higher recently, but his  insulin regimen was recently adjusted. Per MD notes, Hgb A1c pending.   Discussed importance of good PO intake and glycemic control to support healing. Pt denied any further nutritional concerns, however, expressed appreciation for RD visit.   Nutrition-Focused physical exam completed. Findings are no fat depletion, no muscle depletion, and no edema.   Labs reviewed: CBGS: 203-456.  Diet Order:  Diet Heart Room service appropriate?: Yes; Fluid consistency:: Thin  Skin:  Wound (see comment) (necrotic appearing great lt toe)  Last BM:  PTA  Height:   Ht Readings from Last 1 Encounters:  01/06/15 6' (1.829 m)    Weight:   Wt Readings from Last 1 Encounters:  01/06/15 164 lb 6.4 oz (74.571 kg)    Ideal Body Weight:  80.9 kg  BMI:  Body mass index is 22.29 kg/(m^2).  Estimated Nutritional Needs:   Kcal:  2000-2200  Protein:  95-110 grams  Fluid:  2.0-2.2 L  EDUCATION NEEDS:   Education needs addressed  Gordon Wilborn A. Mayford KnifeWilliams, RD, LDN, CDE Pager: [phone] After hours Pager: [phone]

## 2015-01-06 NOTE — H&P (Signed)
[facility] History and Physical  Gordon Walker ZOX:[id number] DOB: Jan 02, 1966 DOA: 01/05/2015  Referring physician: Zadie Rhineonald Wickline, M.D. PCP: [name], MD   Chief Complaint: Toe infection.  HPI: Gordon CousinFrederick Mennen is a 49 y.o. male with a past medical history of type 1 diabetes since age 49 who comes to the ER via EMS referred from an urgent care facility due to the left great toe infection. Per patient, about 2 weeks ago he noticed that he had a small tender area on his great toe, which continued to get progressively worse with development of tenderness, erythema and edema. Per patient, the last 2 days he has been having fever, chills and fatigue at home that he decided to seek medical attention. He denies any type of trauma to the area that he can remember. Workup in the ER reveals mild leukocytosis and a blood glucose of 550 mg/dL   Review of Systems:  Constitutional:  Positive Fevers, chills, fatigue.  No weight loss, night sweats,  HEENT:  No headaches, Difficulty swallowing,Tooth/dental problems,Sore throat,  No sneezing, itching, ear ache, nasal congestion, post nasal drip,  Cardio-vascular:  No chest pain, Orthopnea, PND, swelling in lower extremities, anasarca, dizziness, palpitations  GI:  No heartburn, indigestion, abdominal pain, nausea, vomiting, diarrhea, change in bowel habits, loss of appetite  Resp:  No shortness of breath with exertion or at rest. No excess mucus, no productive cough, No non-productive cough, No coughing up of blood.No change in color of mucus.No wheezing.No chest wall deformity  Skin:  no rash or lesions.  GU:  no dysuria, change in color of urine, no urgency or frequency. No flank pain.  Musculoskeletal:  Left great toe infection. Psych:  No change in mood or affect. No depression or anxiety. No memory loss.   Past Medical History  Diagnosis Date  . Diabetes mellitus without complication (HCC)    History reviewed. No  pertinent past surgical history. Social History:  reports that he has never smoked. He does not have any smokeless tobacco history on file. His alcohol and drug histories are not on file.  No Known Allergies  History reviewed. No pertinent family history.   Prior to Admission medications   Medication Sig Start Date End Date Taking? Authorizing Provider  insulin glargine (LANTUS) 100 UNIT/ML injection Inject 30 Units into the skin at bedtime. Sliding scale   Yes Historical Provider, MD  insulin lispro (HUMALOG) 100 UNIT/ML injection Inject 5-7 Units into the skin See admin instructions. Takes with largest meal   Yes Historical Provider, MD  Multiple Vitamin (MULTIVITAMIN WITH MINERALS) TABS tablet Take 1 tablet by mouth daily.   Yes Historical Provider, MD   Physical Exam: Filed Vitals:   01/05/15 2215 01/05/15 2300 01/05/15 2345 01/06/15 0011  BP: 152/90 155/90 140/80 142/81  Pulse: 87 88 85 89  Temp:    98.4 F (36.9 C)  TempSrc:    Oral  Resp: 16 23 23 20   Height:    6' (1.829 m)  Weight:    74.571 kg (164 lb 6.4 oz)  SpO2: 99% 96% 98% 99%    Wt Readings from Last 3 Encounters:  01/06/15 74.571 kg (164 lb 6.4 oz)    General:  Appears calm and comfortable Eyes: PERRL, normal lids, irises & conjunctiva ENT: grossly normal hearing, lips & tongue Neck: no LAD, masses or thyromegaly Cardiovascular: RRR, no m/r/g. No LE edema. Telemetry: SR, no arrhythmias  Respiratory: CTA bilaterally, no w/r/r. Normal respiratory effort. Abdomen: soft, ntnd  Skin: no rash or induration seen on limited exam Musculoskeletal: Left foot great toe shows an ulcer on the dorsal aspect is with a surrounding tender area of edema and erythema. See pictures below Psychiatric: grossly normal mood and affect, speech fluent and appropriate Neurologic: grossly non-focal.          Labs on Admission:  Basic Metabolic Panel:  Recent Labs Lab 01/05/15 2153 01/05/15 2228  NA 127* 129*  K 4.7 4.6  CL  89* 89*  CO2 27  --   GLUCOSE 552* 548*  BUN 21* 24*  CREATININE 1.46* 1.10  CALCIUM 8.4*  --    CBC:  Recent Labs Lab 01/05/15 1938 01/05/15 2228  WBC 15.3*  --   NEUTROABS 12.4*  --   HGB 10.8* 11.9*  HCT 31.3* 35.0*  MCV 82.4  --   PLT 332  --     Radiological Exams on Admission: Dg Foot Complete Left  01/05/2015  CLINICAL DATA:  Left great toe infection.  Redness and swelling. EXAM: LEFT FOOT - COMPLETE 3+ VIEW COMPARISON:  None. FINDINGS: Soft tissue irregularity and gas about the first phalanx with diffuse soft tissue swelling. Vascular calcifications. Lucency through the proximal aspect of the distal phalanx of the great toe.absence of the medial portion the tuft of the great toe. Vascular calcifications. IMPRESSION: Soft tissue swelling with gas about the great toe, suspicious for infection, possibly with gas producing organism. Absence of the medial aspect of the tuft is suspicious for osteomyelitis. Lucency through the proximal portion of the distal phalanx could represent an atypical appearance of osteomyelitis or subacute fracture. Electronically Signed   By: Jeronimo Greaves M.D.   On: 01/05/2015 21:20      Assessment/Plan Principal Problem:   Osteomyelitis of left foot (HCC) Admit to MedSurg. Continue IV vancomycin and IV Zosyn. Analgesics as needed. Consider checking bone scan or MRI of foot.  Active Problems:   Uncontrolled diabetes mellitus with foot ulcer, with long-term current use of insulin (HCC) No nausea or vomiting. No decreased CO2 or anion gap on blood chemistry. Continue IV fluids. Continue treatment with insulin.    Anemia Check anemia workup. Monitor H&H.  Orthopedic surgery was consulted by the emergency department.  Code Status: Full code. DVT Prophylaxis: Lovenox SQ. Family Communication:  Disposition Plan: Admit for IV antibiotic therapy, blood glucose control.  Time spent: Over 70 minutes were spent during the process of this  admission.  Bobette Mo [facility] Pager [phone].

## 2015-01-06 NOTE — Progress Notes (Signed)
Inpatient Diabetes Program Recommendations  AACE/ADA: New Consensus Statement on Inpatient Glycemic Control (2015)  Target Ranges:  Prepandial:   less than 140 mg/dL      Peak postprandial:   less than 180 mg/dL (1-2 hours)      Critically ill patients:  140 - 180 mg/dL   Review of Glycemic ControlResults for Gordon CousinRANDALL, [name] (MRN [id number]) as of 01/06/2015 13:15  Ref. Range 01/06/2015 00:33 01/06/2015 04:59 01/06/2015 07:24 01/06/2015 11:15  Glucose-Capillary Latest Ref Range: 65-99 mg/dL 829466 (H) 562253 (H) 130203 (H) 251 (H)    Diabetes history: Type 1 diabetes Outpatient Diabetes medications: Lantus 30 units q HS, Humalog 5-7 units tid with meals Current orders for Inpatient glycemic control:  Lantus 30 units q HS, Novolog moderate tid with meals  Inpatient Diabetes Program Recommendations:    Please consider adding Novolog 4 units tid with meals (meal coverage)-Hold if patient eats less than 50%.  Thanks,  Beryl MeagerJenny Hosey Burmester, RN, BC-ADM Inpatient Diabetes Coordinator Pager [phone]

## 2015-01-06 NOTE — Progress Notes (Signed)
VASCULAR LAB PRELIMINARY  ARTERIAL  ABI completed: ABIs suggest probable calcification of vessels.    RIGHT    LEFT    PRESSURE WAVEFORM  PRESSURE WAVEFORM  BRACHIAL 136 T BRACHIAL 146 T  DP 210 T DP 169 T  AT   AT    PT 144 T PT 259 T  PER   PER    GREAT TOE  NA GREAT TOE  NA    RIGHT LEFT  ABI 1.4 1.7     Theone Bowell, RVT 01/06/2015, 3:26 PM

## 2015-01-06 NOTE — Consult Note (Signed)
Reason for Consult: Osteomyelitis ulceration left great toe Referring Physician: Dr Ledon Snare is an 49 y.o. male.  HPI: Patient is a 49 year old gentleman type I diabetic who has had 2 week history of ulceration and drainage from the left great toe he has had a prolonged course of swelling.  Past Medical History  Diagnosis Date  . Diabetes mellitus without complication ([place])     History reviewed. No pertinent past surgical history.  History reviewed. No pertinent family history.  Social History:  reports that he has never smoked. He does not have any smokeless tobacco history on file. His alcohol and drug histories are not on file.  Allergies: No Known Allergies  Medications: I have reviewed the patient's current medications.  Results for orders placed or performed during the hospital encounter of 01/05/15 (from the past 48 hour(s))  CBC with Differential     Status: Abnormal   Collection Time: 01/05/15  7:38 PM  Result Value Ref Range   WBC 15.3 (H) 4.0 - 10.5 K/uL   RBC 3.80 (L) 4.22 - 5.81 MIL/uL   Hemoglobin 10.8 (L) 13.0 - 17.0 g/dL   HCT 31.3 (L) 39.0 - 52.0 %   MCV 82.4 78.0 - 100.0 fL   MCH 28.4 26.0 - 34.0 pg   MCHC 34.5 30.0 - 36.0 g/dL   RDW 11.9 11.5 - 15.5 %   Platelets 332 150 - 400 K/uL   Neutrophils Relative % 81 %   Neutro Abs 12.4 (H) 1.7 - 7.7 K/uL   Lymphocytes Relative 7 %   Lymphs Abs 1.0 0.7 - 4.0 K/uL   Monocytes Relative 12 %   Monocytes Absolute 1.9 (H) 0.1 - 1.0 K/uL   Eosinophils Relative 0 %   Eosinophils Absolute 0.0 0.0 - 0.7 K/uL   Basophils Relative 0 %   Basophils Absolute 0.0 0.0 - 0.1 K/uL  I-Stat CG4 Lactic Acid, ED     Status: None   Collection Time: 01/05/15  7:55 PM  Result Value Ref Range   Lactic Acid, Venous 1.40 0.5 - 2.0 mmol/L  Basic metabolic panel     Status: Abnormal   Collection Time: 01/05/15  9:53 PM  Result Value Ref Range   Sodium 127 (L) 135 - 145 mmol/L   Potassium 4.7 3.5 - 5.1 mmol/L    Chloride 89 (L) 101 - 111 mmol/L   CO2 27 22 - 32 mmol/L   Glucose, Bld 552 (HH) 65 - 99 mg/dL    Comment: CRITICAL RESULT CALLED TO, READ BACK BY AND VERIFIED WITH: REABOLD,A RN 01/05/2015 2254 JORDANS    BUN 21 (H) 6 - 20 mg/dL   Creatinine, Ser 1.46 (H) 0.61 - 1.24 mg/dL   Calcium 8.4 (L) 8.9 - 10.3 mg/dL   GFR calc non Af Amer 56 (L) >60 mL/min   GFR calc Af Amer >60 >60 mL/min    Comment: (NOTE) The eGFR has been calculated using the CKD EPI equation. This calculation has not been validated in all clinical situations. eGFR's persistently <60 mL/min signify possible Chronic Kidney Disease.    Anion gap 11 5 - 15  Magnesium     Status: None   Collection Time: 01/05/15  9:53 PM  Result Value Ref Range   Magnesium 2.2 1.7 - 2.4 mg/dL  Phosphorus     Status: None   Collection Time: 01/05/15  9:53 PM  Result Value Ref Range   Phosphorus 2.9 2.5 - 4.6 mg/dL  I-stat chem 8, ed  Status: Abnormal   Collection Time: 01/05/15 10:28 PM  Result Value Ref Range   Sodium 129 (L) 135 - 145 mmol/L   Potassium 4.6 3.5 - 5.1 mmol/L   Chloride 89 (L) 101 - 111 mmol/L   BUN 24 (H) 6 - 20 mg/dL   Creatinine, Ser 1.10 0.61 - 1.24 mg/dL   Glucose, Bld 548 (H) 65 - 99 mg/dL   Calcium, Ion 1.12 1.12 - 1.23 mmol/L   TCO2 29 0 - 100 mmol/L   Hemoglobin 11.9 (L) 13.0 - 17.0 g/dL   HCT 35.0 (L) 39.0 - 52.0 %  I-Stat CG4 Lactic Acid, ED     Status: None   Collection Time: 01/05/15 10:30 PM  Result Value Ref Range   Lactic Acid, Venous 1.00 0.5 - 2.0 mmol/L  Glucose, capillary     Status: Abnormal   Collection Time: 01/06/15 12:33 AM  Result Value Ref Range   Glucose-Capillary 466 (H) 65 - 99 mg/dL   Comment 1 Notify RN   Glucose, capillary     Status: Abnormal   Collection Time: 01/06/15  4:59 AM  Result Value Ref Range   Glucose-Capillary 253 (H) 65 - 99 mg/dL   Comment 1 Notify RN   Glucose, capillary     Status: Abnormal   Collection Time: 01/06/15  7:24 AM  Result Value Ref Range    Glucose-Capillary 203 (H) 65 - 99 mg/dL    Dg Foot Complete Left  01/05/2015  CLINICAL DATA:  Left great toe infection.  Redness and swelling. EXAM: LEFT FOOT - COMPLETE 3+ VIEW COMPARISON:  None. FINDINGS: Soft tissue irregularity and gas about the first phalanx with diffuse soft tissue swelling. Vascular calcifications. Lucency through the proximal aspect of the distal phalanx of the great toe.absence of the medial portion the tuft of the great toe. Vascular calcifications. IMPRESSION: Soft tissue swelling with gas about the great toe, suspicious for infection, possibly with gas producing organism. Absence of the medial aspect of the tuft is suspicious for osteomyelitis. Lucency through the proximal portion of the distal phalanx could represent an atypical appearance of osteomyelitis or subacute fracture. Electronically Signed   By: Abigail Miyamoto M.D.   On: 01/05/2015 21:20    Review of Systems  All other systems reviewed and are negative.  Blood pressure 134/81, pulse 75, temperature 97.4 F (36.3 C), temperature source Oral, resp. rate 18, height 6' (1.829 m), weight 74.571 kg (164 lb 6.4 oz), SpO2 100 %. Physical Exam On examination patient is alert oriented 9 of the normal affect normal respiratory effort. Patient has no venous stasis ulcers in either leg. He has sausage digit swelling of the left great toe he has insensate neuropathy he has a palpable pulse which is ready. The great toe was swollen with ulceration and drainage. Radiographs reviewed which shows calcification of the digital vessels as well as chronic osteomyelitis of the left great toe. Assessment/Plan: Assessment: Type I diabetic insensate neuropathy peripheral vascular disease with osteomyelitis of the left great toe ulceration and drainage.  Plan: We'll plan for a left great toe first ray amputation. Risk and benefits were discussed including risk of the wound not healing. Discussed that if we are having difficulty with  healing we would need to get a vascular consult. For surgery tomorrow afternoon.  Elizette Shek V 01/06/2015, 7:54 AM

## 2015-01-07 ENCOUNTER — Inpatient Hospital Stay (HOSPITAL_COMMUNITY): Payer: BLUE CROSS/BLUE SHIELD | Admitting: Anesthesiology

## 2015-01-07 ENCOUNTER — Encounter (HOSPITAL_COMMUNITY)
Admission: EM | Disposition: A | Discharge status: Home / Self Care | Payer: Self-pay | Source: Home / Self Care | Attending: Internal Medicine

## 2015-01-07 DIAGNOSIS — D6489 Other specified anemias: Secondary | ICD-10-CM

## 2015-01-07 DIAGNOSIS — L97509 Non-pressure chronic ulcer of other part of unspecified foot with unspecified severity: Secondary | ICD-10-CM

## 2015-01-07 DIAGNOSIS — R7401 Elevation of levels of liver transaminase levels: Secondary | ICD-10-CM | POA: Diagnosis not present

## 2015-01-07 DIAGNOSIS — E08621 Diabetes mellitus due to underlying condition with foot ulcer: Secondary | ICD-10-CM

## 2015-01-07 DIAGNOSIS — R740 Nonspecific elevation of levels of transaminase and lactic acid dehydrogenase [LDH]: Secondary | ICD-10-CM

## 2015-01-07 DIAGNOSIS — E0865 Diabetes mellitus due to underlying condition with hyperglycemia: Secondary | ICD-10-CM

## 2015-01-07 DIAGNOSIS — Z794 Long term (current) use of insulin: Secondary | ICD-10-CM

## 2015-01-07 HISTORY — PX: AMPUTATION: SHX166

## 2015-01-07 LAB — HEMOGLOBIN A1C
Hgb A1c MFr Bld: 13.5 % — ABNORMAL HIGH (ref 4.8–5.6)
Mean Plasma Glucose: 341 mg/dL

## 2015-01-07 LAB — RETICULOCYTES
RBC.: 3.44 MIL/uL — AB (ref 4.22–5.81)
RETIC COUNT ABSOLUTE: 24.1 10*3/uL (ref 19.0–186.0)
RETIC CT PCT: 0.7 % (ref 0.4–3.1)

## 2015-01-07 LAB — GLUCOSE, CAPILLARY
GLUCOSE-CAPILLARY: 150 mg/dL — AB (ref 65–99)
GLUCOSE-CAPILLARY: 354 mg/dL — AB (ref 65–99)
Glucose-Capillary: 179 mg/dL — ABNORMAL HIGH (ref 65–99)
Glucose-Capillary: 142 mg/dL — ABNORMAL HIGH (ref 65–99)
Glucose-Capillary: 171 mg/dL — ABNORMAL HIGH (ref 65–99)
Glucose-Capillary: 148 mg/dL — ABNORMAL HIGH (ref 65–99)

## 2015-01-07 LAB — COMPREHENSIVE METABOLIC PANEL
ALK PHOS: 223 U/L — AB (ref 38–126)
ALT: 72 U/L — AB (ref 17–63)
AST: 145 U/L — ABNORMAL HIGH (ref 15–41)
Albumin: 1.8 g/dL — ABNORMAL LOW (ref 3.5–5.0)
Anion gap: 6 (ref 5–15)
BUN: 12 mg/dL (ref 6–20)
CALCIUM: 8 mg/dL — AB (ref 8.9–10.3)
CO2: 28 mmol/L (ref 22–32)
CREATININE: 1.12 mg/dL (ref 0.61–1.24)
Chloride: 99 mmol/L — ABNORMAL LOW (ref 101–111)
GFR calc non Af Amer: >60 mL/min (ref >60)
GLUCOSE: 206 mg/dL — AB (ref 65–99)
Potassium: 3.9 mmol/L (ref 3.5–5.1)
SODIUM: 133 mmol/L — AB (ref 135–145)
Total Bilirubin: 0.8 mg/dL (ref 0.3–1.2)
Total Protein: 6.2 g/dL — ABNORMAL LOW (ref 6.5–8.1)

## 2015-01-07 LAB — IRON AND TIBC
Iron: 31 ug/dL — ABNORMAL LOW (ref 45–182)
Saturation Ratios: 30 % (ref 17.9–39.5)
TIBC: 102 ug/dL — ABNORMAL LOW (ref 250–450)
UIBC: 71 ug/dL

## 2015-01-07 LAB — CBC WITH DIFFERENTIAL/PLATELET
BASOS PCT: 0 %
Basophils Absolute: 0 10*3/uL (ref 0.0–0.1)
EOS ABS: 0.1 10*3/uL (ref 0.0–0.7)
EOS PCT: 0 %
HCT: 29.2 % — ABNORMAL LOW (ref 39.0–52.0)
Hemoglobin: 9.9 g/dL — ABNORMAL LOW (ref 13.0–17.0)
Lymphocytes Relative: 14 %
Lymphs Abs: 1.6 10*3/uL (ref 0.7–4.0)
MCH: 28.8 pg (ref 26.0–34.0)
MCHC: 33.9 g/dL (ref 30.0–36.0)
MCV: 84.9 fL (ref 78.0–100.0)
MONO ABS: 1.2 10*3/uL — AB (ref 0.1–1.0)
MONOS PCT: 11 %
NEUTROS PCT: 75 %
Neutro Abs: 8.5 10*3/uL — ABNORMAL HIGH (ref 1.7–7.7)
PLATELETS: 289 10*3/uL (ref 150–400)
RBC: 3.44 MIL/uL — ABNORMAL LOW (ref 4.22–5.81)
RDW: 12 % (ref 11.5–15.5)
WBC: 11.3 10*3/uL — ABNORMAL HIGH (ref 4.0–10.5)

## 2015-01-07 LAB — SURGICAL PCR SCREEN
MRSA, PCR: NEGATIVE
STAPHYLOCOCCUS AUREUS: POSITIVE — AB

## 2015-01-07 LAB — FERRITIN: Ferritin: 387 ng/mL — ABNORMAL HIGH (ref 24–336)

## 2015-01-07 LAB — VANCOMYCIN, TROUGH: VANCOMYCIN TR: 10 ug/mL (ref 10.0–20.0)

## 2015-01-07 LAB — VITAMIN B12: VITAMIN B 12: 2926 pg/mL — AB (ref 180–914)

## 2015-01-07 LAB — FOLATE: FOLATE: 18.5 ng/mL (ref >5.9)

## 2015-01-07 SURGERY — AMPUTATION, FOOT, RAY
Anesthesia: General | Laterality: Left

## 2015-01-07 MED ORDER — METOCLOPRAMIDE HCL 5 MG PO TABS: 5.0000 mg | ORAL_TABLET | Freq: Three times a day (TID) | ORAL | Status: DC | PRN

## 2015-01-07 MED ORDER — ACETAMINOPHEN 325 MG PO TABS: 650.0000 mg | ORAL_TABLET | Freq: Four times a day (QID) | ORAL | Status: DC | PRN

## 2015-01-07 MED ORDER — ONDANSETRON HCL 4 MG PO TABS: 4.0000 mg | ORAL_TABLET | Freq: Four times a day (QID) | ORAL | Status: DC | PRN

## 2015-01-07 MED ORDER — HYDROMORPHONE HCL 1 MG/ML IJ SOLN
0.2500 mg | INTRAMUSCULAR | Status: DC | PRN
Start: 2015-01-07 — End: 2015-01-07
  Administered 2015-01-07 (×2): 0.5 mg via INTRAVENOUS

## 2015-01-07 MED ORDER — METHOCARBAMOL 500 MG PO TABS
500.0000 mg | ORAL_TABLET | Freq: Four times a day (QID) | ORAL | Status: DC | PRN
  Administered 2015-01-07 – 2015-01-09 (×4): 500 mg via ORAL
  Filled 2015-01-07 (×4): qty 1

## 2015-01-07 MED ORDER — PROPOFOL 10 MG/ML IV BOLUS
INTRAVENOUS | Status: DC | PRN
  Administered 2015-01-07: 150 mg via INTRAVENOUS

## 2015-01-07 MED ORDER — MIDAZOLAM HCL 2 MG/2ML IJ SOLN
INTRAMUSCULAR | Status: AC
  Filled 2015-01-07: qty 2

## 2015-01-07 MED ORDER — LACTATED RINGERS IV SOLN
INTRAVENOUS | Status: DC | PRN
  Administered 2015-01-07: 14:00:00 via INTRAVENOUS

## 2015-01-07 MED ORDER — ACETAMINOPHEN 650 MG RE SUPP: 650.0000 mg | Freq: Four times a day (QID) | RECTAL | Status: DC | PRN

## 2015-01-07 MED ORDER — ONDANSETRON HCL 4 MG/2ML IJ SOLN
INTRAMUSCULAR | Status: DC | PRN
  Administered 2015-01-07: 4 mg via INTRAVENOUS

## 2015-01-07 MED ORDER — SODIUM CHLORIDE 0.9 % IV SOLN
INTRAVENOUS | Status: DC
  Administered 2015-01-07: 18:00:00 via INTRAVENOUS

## 2015-01-07 MED ORDER — 0.9 % SODIUM CHLORIDE (POUR BTL) OPTIME
TOPICAL | Status: DC | PRN
  Administered 2015-01-07: 1000 mL

## 2015-01-07 MED ORDER — METHOCARBAMOL 1000 MG/10ML IJ SOLN
500.0000 mg | Freq: Four times a day (QID) | INTRAVENOUS | Status: DC | PRN
  Filled 2015-01-07: qty 5

## 2015-01-07 MED ORDER — PROPOFOL 10 MG/ML IV BOLUS
INTRAVENOUS | Status: AC
  Filled 2015-01-07: qty 20

## 2015-01-07 MED ORDER — VANCOMYCIN HCL IN DEXTROSE 750-5 MG/150ML-% IV SOLN
750.0000 mg | Freq: Three times a day (TID) | INTRAVENOUS | Status: DC
Start: 2015-01-07 — End: 2015-01-10
  Administered 2015-01-08 – 2015-01-10 (×7): 750 mg via INTRAVENOUS
  Filled 2015-01-07 (×9): qty 150

## 2015-01-07 MED ORDER — HYDROMORPHONE HCL 1 MG/ML IJ SOLN
INTRAMUSCULAR | Status: AC
  Filled 2015-01-07: qty 1

## 2015-01-07 MED ORDER — MEPERIDINE HCL 25 MG/ML IJ SOLN: 6.2500 mg | INTRAMUSCULAR | Status: DC | PRN

## 2015-01-07 MED ORDER — FENTANYL CITRATE (PF) 250 MCG/5ML IJ SOLN
INTRAMUSCULAR | Status: AC
  Filled 2015-01-07: qty 5

## 2015-01-07 MED ORDER — CHLORHEXIDINE GLUCONATE CLOTH 2 % EX PADS
6.0000 | MEDICATED_PAD | Freq: Every day | CUTANEOUS | Status: DC
  Administered 2015-01-07 – 2015-01-10 (×4): 6 via TOPICAL

## 2015-01-07 MED ORDER — ONDANSETRON HCL 4 MG/2ML IJ SOLN: 4.0000 mg | Freq: Four times a day (QID) | INTRAMUSCULAR | Status: DC | PRN

## 2015-01-07 MED ORDER — METOCLOPRAMIDE HCL 5 MG/ML IJ SOLN: 5.0000 mg | Freq: Three times a day (TID) | INTRAMUSCULAR | Status: DC | PRN

## 2015-01-07 MED ORDER — FENTANYL CITRATE (PF) 100 MCG/2ML IJ SOLN
INTRAMUSCULAR | Status: DC | PRN
  Administered 2015-01-07: 50 ug via INTRAVENOUS
  Administered 2015-01-07 (×2): 25 ug via INTRAVENOUS

## 2015-01-07 MED ORDER — MUPIROCIN 2 % EX OINT
1.0000 "application " | TOPICAL_OINTMENT | Freq: Two times a day (BID) | CUTANEOUS | Status: DC
  Administered 2015-01-07 – 2015-01-10 (×7): 1 via NASAL
  Filled 2015-01-07 (×2): qty 22

## 2015-01-07 MED ORDER — MIDAZOLAM HCL 5 MG/5ML IJ SOLN
INTRAMUSCULAR | Status: DC | PRN
  Administered 2015-01-07: 2 mg via INTRAVENOUS

## 2015-01-07 MED ORDER — ONDANSETRON HCL 4 MG/2ML IJ SOLN: 4.0000 mg | Freq: Once | INTRAMUSCULAR | Status: DC | PRN

## 2015-01-07 SURGICAL SUPPLY — 30 items
BLADE SAW SGTL MED 73X18.5 STR (BLADE) IMPLANT
BNDG COHESIVE 4X5 TAN STRL (GAUZE/BANDAGES/DRESSINGS) ×2 IMPLANT
BNDG GAUZE ELAST 4 BULKY (GAUZE/BANDAGES/DRESSINGS) ×2 IMPLANT
COVER SURGICAL LIGHT HANDLE (MISCELLANEOUS) ×2 IMPLANT
DRAPE U-SHAPE 47X51 STRL (DRAPES) ×4 IMPLANT
DRSG ADAPTIC 3X8 NADH LF (GAUZE/BANDAGES/DRESSINGS) ×2 IMPLANT
DRSG PAD ABDOMINAL 8X10 ST (GAUZE/BANDAGES/DRESSINGS) ×2 IMPLANT
DURAPREP 26ML APPLICATOR (WOUND CARE) ×2 IMPLANT
ELECT REM PT RETURN 9FT ADLT (ELECTROSURGICAL) ×2
ELECTRODE REM PT RTRN 9FT ADLT (ELECTROSURGICAL) ×1 IMPLANT
GAUZE SPONGE 4X4 12PLY STRL (GAUZE/BANDAGES/DRESSINGS) ×2 IMPLANT
GLOVE BIOGEL PI IND STRL 9 (GLOVE) ×1 IMPLANT
GLOVE BIOGEL PI INDICATOR 9 (GLOVE) ×1
GLOVE SURG ORTHO 9.0 STRL STRW (GLOVE) ×2 IMPLANT
GOWN STRL REUS W/ TWL LRG LVL3 (GOWN DISPOSABLE) ×1 IMPLANT
GOWN STRL REUS W/ TWL XL LVL3 (GOWN DISPOSABLE) ×2 IMPLANT
GOWN STRL REUS W/TWL LRG LVL3 (GOWN DISPOSABLE) ×1
GOWN STRL REUS W/TWL XL LVL3 (GOWN DISPOSABLE) ×2
KIT BASIN OR (CUSTOM PROCEDURE TRAY) ×2 IMPLANT
KIT ROOM TURNOVER OR (KITS) ×2 IMPLANT
NS IRRIG 1000ML POUR BTL (IV SOLUTION) ×2 IMPLANT
PACK ORTHO EXTREMITY (CUSTOM PROCEDURE TRAY) ×2 IMPLANT
PAD ARMBOARD 7.5X6 YLW CONV (MISCELLANEOUS) ×2 IMPLANT
SPONGE LAP 18X18 X RAY DECT (DISPOSABLE) ×2 IMPLANT
STOCKINETTE IMPERVIOUS LG (DRAPES) IMPLANT
SUT ETHILON 2 0 PSLX (SUTURE) ×4 IMPLANT
TOWEL OR 17X24 6PK STRL BLUE (TOWEL DISPOSABLE) ×2 IMPLANT
TOWEL OR 17X26 10 PK STRL BLUE (TOWEL DISPOSABLE) IMPLANT
UNDERPAD 30X30 INCONTINENT (UNDERPADS AND DIAPERS) ×2 IMPLANT
WATER STERILE IRR 1000ML POUR (IV SOLUTION) IMPLANT

## 2015-01-07 NOTE — Progress Notes (Addendum)
Inpatient Diabetes Program Recommendations  AACE/ADA: New Consensus Statement on Inpatient Glycemic Control (2015)  Target Ranges:  Prepandial:   less than 140 mg/dL      Peak postprandial:   less than 180 mg/dL (1-2 hours)      Critically ill patients:  140 - 180 mg/dL   Review of Glycemic ControlResults for Gordon Walker, Gordon Walker (MRN [id number]) as of 01/07/2015 13:15  Ref. Range 01/06/2015 08:38  Hemoglobin A1C Latest Ref Range: 4.8-5.6 % 13.5 (H)  Results for Gordon Walker, Gordon Walker (MRN [id number]) as of 01/07/2015 13:15  Ref. Range 01/06/2015 17:11 01/06/2015 21:19 01/07/2015 07:26 01/07/2015 10:23 01/07/2015 12:33  Glucose-Capillary Latest Ref Range: 65-99 mg/dL 782311 (H) 956194 (H) 213179 (H) 142 (H) 171 (H)    Diabetes history: Type 1 diabetes since age 49 Outpatient Diabetes medications: Lantus 30 units daily, Humalog 5-7 units with largest meal Current orders for Inpatient glycemic control:  Lantus 30 units daily, Novolog moderate tid with meals  Inpatient Diabetes Program Recommendations:   Note that blood sugars are improved in hospital.  A1C very elevated.  Saw patient regarding elevated A1C.  He states that he takes insulin as ordered and does not miss doses.  He is only taking Humalog with his largest meal due to new regimen.  [facility] however now see's Dr. Mayford KnifeWilliams for PCP. Inspected patient's injection sites and noted that left lower abdomen has knot/scar tissue.  Patient admits that this is where he injects his insulin at home and sometimes rotates to other side.  Explained to patient that this may be the reason that insulin is not absorbing properly.  Discussed rotation of sites and the need for monitoring.  He admits that he has also run out of strips so therefore he is not checking his blood sugars.  He states that his A1C has never been this high and he seems to understand why he needs to control his blood sugars.  Wife states that she see's Dr. Sharl MaKerr and would  like patient to also see him for his diabetes.  Strongly encouraged patient to follow-up with Dr. Sharl MaKerr regarding diabetes management.  Will follow.  Once diet is restarted, please add Novolog meal coverage 4 units tid with meals.  Note that patient will need insulin with each meal due to history of Type 1 diabetes.  MD please also write patient Rx. For glucose strips at discharge for Accu-chek meter.  Thanks, Beryl MeagerJenny Trea Latner, RN, BC-ADM Inpatient Diabetes Coordinator Pager [phone] (8a-5p)

## 2015-01-07 NOTE — Anesthesia Postprocedure Evaluation (Signed)
Anesthesia Post Note  Patient: Gordon Walker  Procedure(s) Performed: Procedure(s) (LRB): Left Foot 1st Ray Amputation (Left)  Patient location during evaluation: PACU Anesthesia Type: General Level of consciousness: awake and alert Pain management: pain level controlled Vital Signs Assessment: post-procedure vital signs reviewed and stable Respiratory status: spontaneous breathing, nonlabored ventilation, respiratory function stable and patient connected to nasal cannula oxygen Cardiovascular status: blood pressure returned to baseline and stable Postop Assessment: no signs of nausea or vomiting Anesthetic complications: no    Last Vitals:  Filed Vitals:   01/07/15 0520 01/07/15 1516  BP: 137/74   Pulse: 78   Temp: 37.2 C 36.3 C  Resp: 18     Last Pain:  Filed Vitals:   01/07/15 1520  PainSc: Asleep                 Cassey Hurrell DAVID      

## 2015-01-07 NOTE — Progress Notes (Addendum)
[facility] PROGRESS NOTE  Syrus Nakama WUJ:[id number] DOB: 1965-03-21 DOA: 01/05/2015  PCP: Jearld Lesch, MD  Brief HPI: 49 year old African-American male with a past medical history of type 1 diabetes presented with complains of pain in his left foot, and the left first toe. He was noted to have infection with suspicion for osteomyelitis on imaging studies. He was hospitalized for further management.  Past medical history:  Past Medical History  Diagnosis Date  . Diabetes mellitus without complication [facility])     Consultants: Orthopedics  Procedures: Plan is for a ray amputation 12/16  Antibiotics: Vancomycin and Zosyn  Subjective: Patient states that the pain is well controlled. Denies any other complaints currently. Waiting for his surgery.   Objective: Vital Signs  Filed Vitals:   01/06/15 0506 01/06/15 1321 01/06/15 2230 01/07/15 0520  BP: 134/81 176/95 129/75 137/74  Pulse: 75 98 73 78  Temp: 97.4 F (36.3 C) 99.8 F (37.7 C) 98.7 F (37.1 C) 98.9 F (37.2 C)  TempSrc: Oral Oral Oral Oral  Resp: Height:      Weight:      SpO2: 100% 100% 97% 98%    Intake/Output Summary (Last 24 hours) at 01/07/15 1221 Last data filed at 01/07/15 0600  Gross per 24 hour  Intake    720 ml  Output   1100 ml  Net   -380 ml   Filed Weights   01/06/15 0011  Weight: 74.571 kg (164 lb 6.4 oz)    General appearance: alert, cooperative, appears stated age and no distress Resp: clear to auscultation bilaterally Cardio: regular rate and rhythm, S1, S2 normal, no murmur, click, rub or gallop GI: soft, non-tender; bowel sounds normal; no masses,  no organomegaly Extremities: Erythema noted over the dorsal aspect of the left foot. Left first toe is a mottled appearance with yellowish exudate. Foul-smelling. Tender to palpation. Pulses are present but poorly palpable. Neurologic: Alert and oriented 3. No focal neurological deficits are noted.  Lab  Results:  Basic Metabolic Panel:  Recent Labs Lab 01/05/15 2153 01/05/15 2228 01/06/15 0838 01/07/15 0415  NA 127* 129* 138 133*  K 4.7 4.6 4.2 3.9  CL 89* 89* 101 99*  CO2 27  --  31 28  GLUCOSE 552* 548* 198* 206*  BUN 21* 24* 12 12  CREATININE 1.46* 1.10 1.01 1.12  CALCIUM 8.4*  --  8.4* 8.0*  MG 2.2  --   --   --   PHOS 2.9  --   --   --    Liver Function Tests:  Recent Labs Lab 01/06/15 0838 01/07/15 0415  AST 33 145*  ALT 32 72*  ALKPHOS 146* 223*  BILITOT 0.8 0.8  PROT 6.1* 6.2*  ALBUMIN 2.1* 1.8*   CBC:  Recent Labs Lab 01/05/15 1938 01/05/15 2228 01/06/15 0838 01/07/15 0415  WBC 15.3*  --  11.7* 11.3*  NEUTROABS 12.4*  --  9.3* 8.5*  HGB 10.8* 11.9* 10.6* 9.9*  HCT 31.3* 35.0* 31.5* 29.2*  MCV 82.4  --  83.3 84.9  PLT 332  --  287 289    CBG:  Recent Labs Lab 01/06/15 1115 01/06/15 1711 01/06/15 2119 01/07/15 0726 01/07/15 1023  GLUCAP 251* 311* 194* 179* 142*    Recent Results (from the past 240 hour(s))  Surgical pcr screen     Status: Abnormal   Collection Time: 01/06/15 10:24 PM  Result Value Ref Range Status   MRSA, PCR NEGATIVE NEGATIVE  Final   Staphylococcus aureus POSITIVE (A) NEGATIVE Final    Comment:        The Xpert SA Assay (FDA approved for NASAL specimens in patients over 47 years of age), is one component of a comprehensive surveillance program.  Test performance has been validated by [facility] for patients greater than or equal to 10934 year old. It is not intended to diagnose infection nor to guide or monitor treatment.       Studies/Results: Dg Foot Complete Left  01/05/2015  CLINICAL DATA:  Left great toe infection.  Redness and swelling. EXAM: LEFT FOOT - COMPLETE 3+ VIEW COMPARISON:  None. FINDINGS: Soft tissue irregularity and gas about the first phalanx with diffuse soft tissue swelling. Vascular calcifications. Lucency through the proximal aspect of the distal phalanx of the great toe.absence of  the medial portion the tuft of the great toe. Vascular calcifications. IMPRESSION: Soft tissue swelling with gas about the great toe, suspicious for infection, possibly with gas producing organism. Absence of the medial aspect of the tuft is suspicious for osteomyelitis. Lucency through the proximal portion of the distal phalanx could represent an atypical appearance of osteomyelitis or subacute fracture. Electronically Signed   By: Jeronimo GreavesKyle  Talbot M.D.   On: 01/05/2015 21:20    Medications:  Scheduled: . Chlorhexidine Gluconate Cloth  6 each Topical Daily  . enoxaparin (LOVENOX) injection  40 mg Subcutaneous QHS  . feeding supplement (PRO-STAT SUGAR FREE 64)  30 mL Oral Daily  . insulin aspart  0-15 Units Subcutaneous TID WC  . insulin glargine  30 Units Subcutaneous QHS  . multivitamin with minerals  1 tablet Oral Daily  . mupirocin ointment  1 application Nasal BID  . pantoprazole  40 mg Oral Daily  . piperacillin-tazobactam (ZOSYN)  IV  3.375 g Intravenous 3 times per day  . vancomycin  1,000 mg Intravenous Q12H   Continuous:   MWU:[id number]:acetaminophen **OR** acetaminophen, ketorolac, ondansetron **OR** ondansetron (ZOFRAN) IV, oxyCODONE  Assessment/Plan:  Principal Problem:   Osteomyelitis of left foot (HCC) Active Problems:   Uncontrolled diabetes mellitus with foot ulcer, with long-term current use of insulin (HCC)   Anemia    Infection of the left foot with osteomyelitis involving the left first toe Continue broad-spectrum antibiotics for now. Analgesics as needed. Arterial Dopplers have been done. ABI noted to be greater than 1. Orthopedics has seen the patient. Plan is for a ray amputation today.  Uncontrolled type 1 diabetes with foot ulcer Patient was hyperglycemic at the time of admission. Most likely due to infection. HbA1c is 13.5, implying very poor glycemic control. This brings into question his compliance. Continue Lantus and sliding scale coverage. Will further adjust the  dose of Lantus postoperatively.  Normocytic anemia Likely secondary to chronic disease. Anemia panel reviewed. No deficiencies identified. Hemoglobin noted to be slightly lower today. No overt bleeding. Continue to monitor.  Pseudohyponatremia Secondary to elevated blood glucose levels. Sodium level is improved.  Transaminitis LFTs elevated. Etiology unclear. Could be due to infection. Patient denies any abdominal pain, and his abdomen is benign. Hepatitis panel will be ordered. Labs will be repeated tomorrow.  DVT Prophylaxis: Lovenox    Code Status: Full code  Family Communication: Discussed with the patient  Disposition Plan: Continue current treatment. Plan is for surgical intervention today.     LOS: 2 days   [facility]on[facility]ISHNA[facility],Quetzaly Ebner  [facility] Pager [phone] 01/07/2015, 12:21 PM  If 7PM-7AM, please contact night-coverage at [email or web address], password [facility]

## 2015-01-07 NOTE — H&P (View-Only) (Signed)
Reason for Consult: Osteomyelitis ulceration left great toe Referring Physician: Dr Ledon Snare is an 49 y.o. male.  HPI: Patient is a 49 year old gentleman type I diabetic who has had 2 week history of ulceration and drainage from the left great toe he has had a prolonged course of swelling.  Past Medical History  Diagnosis Date  . Diabetes mellitus without complication ([place])     History reviewed. No pertinent past surgical history.  History reviewed. No pertinent family history.  Social History:  reports that he has never smoked. He does not have any smokeless tobacco history on file. His alcohol and drug histories are not on file.  Allergies: No Known Allergies  Medications: I have reviewed the patient's current medications.  Results for orders placed or performed during the hospital encounter of 01/05/15 (from the past 48 hour(s))  CBC with Differential     Status: Abnormal   Collection Time: 01/05/15  7:38 PM  Result Value Ref Range   WBC 15.3 (H) 4.0 - 10.5 K/uL   RBC 3.80 (L) 4.22 - 5.81 MIL/uL   Hemoglobin 10.8 (L) 13.0 - 17.0 g/dL   HCT 31.3 (L) 39.0 - 52.0 %   MCV 82.4 78.0 - 100.0 fL   MCH 28.4 26.0 - 34.0 pg   MCHC 34.5 30.0 - 36.0 g/dL   RDW 11.9 11.5 - 15.5 %   Platelets 332 150 - 400 K/uL   Neutrophils Relative % 81 %   Neutro Abs 12.4 (H) 1.7 - 7.7 K/uL   Lymphocytes Relative 7 %   Lymphs Abs 1.0 0.7 - 4.0 K/uL   Monocytes Relative 12 %   Monocytes Absolute 1.9 (H) 0.1 - 1.0 K/uL   Eosinophils Relative 0 %   Eosinophils Absolute 0.0 0.0 - 0.7 K/uL   Basophils Relative 0 %   Basophils Absolute 0.0 0.0 - 0.1 K/uL  I-Stat CG4 Lactic Acid, ED     Status: None   Collection Time: 01/05/15  7:55 PM  Result Value Ref Range   Lactic Acid, Venous 1.40 0.5 - 2.0 mmol/L  Basic metabolic panel     Status: Abnormal   Collection Time: 01/05/15  9:53 PM  Result Value Ref Range   Sodium 127 (L) 135 - 145 mmol/L   Potassium 4.7 3.5 - 5.1 mmol/L    Chloride 89 (L) 101 - 111 mmol/L   CO2 27 22 - 32 mmol/L   Glucose, Bld 552 (HH) 65 - 99 mg/dL    Comment: CRITICAL RESULT CALLED TO, READ BACK BY AND VERIFIED WITH: REABOLD,A RN 01/05/2015 2254 JORDANS    BUN 21 (H) 6 - 20 mg/dL   Creatinine, Ser 1.46 (H) 0.61 - 1.24 mg/dL   Calcium 8.4 (L) 8.9 - 10.3 mg/dL   GFR calc non Af Amer 56 (L) >60 mL/min   GFR calc Af Amer >60 >60 mL/min    Comment: (NOTE) The eGFR has been calculated using the CKD EPI equation. This calculation has not been validated in all clinical situations. eGFR's persistently <60 mL/min signify possible Chronic Kidney Disease.    Anion gap 11 5 - 15  Magnesium     Status: None   Collection Time: 01/05/15  9:53 PM  Result Value Ref Range   Magnesium 2.2 1.7 - 2.4 mg/dL  Phosphorus     Status: None   Collection Time: 01/05/15  9:53 PM  Result Value Ref Range   Phosphorus 2.9 2.5 - 4.6 mg/dL  I-stat chem 8, ed  Status: Abnormal   Collection Time: 01/05/15 10:28 PM  Result Value Ref Range   Sodium 129 (L) 135 - 145 mmol/L   Potassium 4.6 3.5 - 5.1 mmol/L   Chloride 89 (L) 101 - 111 mmol/L   BUN 24 (H) 6 - 20 mg/dL   Creatinine, Ser 1.10 0.61 - 1.24 mg/dL   Glucose, Bld 548 (H) 65 - 99 mg/dL   Calcium, Ion 1.12 1.12 - 1.23 mmol/L   TCO2 29 0 - 100 mmol/L   Hemoglobin 11.9 (L) 13.0 - 17.0 g/dL   HCT 35.0 (L) 39.0 - 52.0 %  I-Stat CG4 Lactic Acid, ED     Status: None   Collection Time: 01/05/15 10:30 PM  Result Value Ref Range   Lactic Acid, Venous 1.00 0.5 - 2.0 mmol/L  Glucose, capillary     Status: Abnormal   Collection Time: 01/06/15 12:33 AM  Result Value Ref Range   Glucose-Capillary 466 (H) 65 - 99 mg/dL   Comment 1 Notify RN   Glucose, capillary     Status: Abnormal   Collection Time: 01/06/15  4:59 AM  Result Value Ref Range   Glucose-Capillary 253 (H) 65 - 99 mg/dL   Comment 1 Notify RN   Glucose, capillary     Status: Abnormal   Collection Time: 01/06/15  7:24 AM  Result Value Ref Range    Glucose-Capillary 203 (H) 65 - 99 mg/dL    Dg Foot Complete Left  01/05/2015  CLINICAL DATA:  Left great toe infection.  Redness and swelling. EXAM: LEFT FOOT - COMPLETE 3+ VIEW COMPARISON:  None. FINDINGS: Soft tissue irregularity and gas about the first phalanx with diffuse soft tissue swelling. Vascular calcifications. Lucency through the proximal aspect of the distal phalanx of the great toe.absence of the medial portion the tuft of the great toe. Vascular calcifications. IMPRESSION: Soft tissue swelling with gas about the great toe, suspicious for infection, possibly with gas producing organism. Absence of the medial aspect of the tuft is suspicious for osteomyelitis. Lucency through the proximal portion of the distal phalanx could represent an atypical appearance of osteomyelitis or subacute fracture. Electronically Signed   By: Abigail Miyamoto M.D.   On: 01/05/2015 21:20    Review of Systems  All other systems reviewed and are negative.  Blood pressure 134/81, pulse 75, temperature 97.4 F (36.3 C), temperature source Oral, resp. rate 18, height 6' (1.829 m), weight 74.571 kg (164 lb 6.4 oz), SpO2 100 %. Physical Exam On examination patient is alert oriented 9 of the normal affect normal respiratory effort. Patient has no venous stasis ulcers in either leg. He has sausage digit swelling of the left great toe he has insensate neuropathy he has a palpable pulse which is ready. The great toe was swollen with ulceration and drainage. Radiographs reviewed which shows calcification of the digital vessels as well as chronic osteomyelitis of the left great toe. Assessment/Plan: Assessment: Type I diabetic insensate neuropathy peripheral vascular disease with osteomyelitis of the left great toe ulceration and drainage.  Plan: We'll plan for a left great toe first ray amputation. Risk and benefits were discussed including risk of the wound not healing. Discussed that if we are having difficulty with  healing we would need to get a vascular consult. For surgery tomorrow afternoon.  Carrina Schoenberger V 01/06/2015, 7:54 AM

## 2015-01-07 NOTE — Progress Notes (Signed)
ANTIBIOTIC CONSULT NOTE - INITIAL  Pharmacy Consult for vancomycin Indication: osteomyelitis  No Known Allergies  Patient Measurements: Height: 6' (182.9 cm) Weight: 164 lb 6.4 oz (74.571 kg) IBW/kg (Calculated) : 77.6 Adjusted Body Weight:   Vital Signs: Temp: 98.9 F (37.2 C) (12/16 0520) Temp Source: Oral (12/16 0520) BP: 137/74 mmHg (12/16 0520) Pulse Rate: 78 (12/16 0520) Intake/Output from previous day: 12/15 0701 - 12/16 0700 In: 1080 [P.O.:1080] Out: 1400 [Urine:1400] Intake/Output from this shift:    Labs:  Recent Labs  01/05/15 1938  01/05/15 2228 01/06/15 0838 01/07/15 0415  WBC 15.3*  --   --  11.7* 11.3*  HGB 10.8*  --  11.9* 10.6* 9.9*  PLT 332  --   --  287 289  CREATININE  --   < > 1.10 1.01 1.12  < > = values in this interval not displayed. Estimated Creatinine Clearance: 86 mL/min (by C-G formula based on Cr of 1.12). No results for input(s): VANCOTROUGH, VANCOPEAK, VANCORANDOM, GENTTROUGH, GENTPEAK, GENTRANDOM, TOBRATROUGH, TOBRAPEAK, TOBRARND, AMIKACINPEAK, AMIKACINTROU, AMIKACIN in the last 72 hours.   Microbiology: Recent Results (from the past 720 hour(s))  Surgical pcr screen     Status: Abnormal   Collection Time: 01/06/15 10:24 PM  Result Value Ref Range Status   MRSA, PCR NEGATIVE NEGATIVE Final   Staphylococcus aureus POSITIVE (A) NEGATIVE Final    Comment:        The Xpert SA Assay (FDA approved for NASAL specimens in patients over 47 years of age), is one component of a comprehensive surveillance program.  Test performance has been validated by [facility] for patients greater than or equal to 47 year old. It is not intended to diagnose infection nor to guide or monitor treatment.     Medical History: Past Medical History  Diagnosis Date  . Diabetes mellitus without complication (HCC)     Medications:  Anti-infectives    Start     Dose/Rate Route Frequency Ordered Stop   01/06/15 1000  vancomycin (VANCOCIN) IVPB  1000 mg/200 mL premix     1,000 mg 200 mL/hr over 60 Minutes Intravenous Every 12 hours 01/06/15 0027     01/06/15 0400  piperacillin-tazobactam (ZOSYN) IVPB 3.375 g     3.375 g 12.5 mL/hr over 240 Minutes Intravenous 3 times per day 01/06/15 0048     01/06/15 0030  piperacillin-tazobactam (ZOSYN) IVPB 3.375 g  Status:  Discontinued     3.375 g 100 mL/hr over 30 Minutes Intravenous 3 times per day 01/06/15 0027 01/06/15 0047   01/05/15 2130  vancomycin (VANCOCIN) IVPB 1000 mg/200 mL premix     1,000 mg 200 mL/hr over 60 Minutes Intravenous  Once 01/05/15 2123 01/05/15 2301   01/05/15 2100  piperacillin-tazobactam (ZOSYN) IVPB 3.375 g     3.375 g 100 mL/hr over 30 Minutes Intravenous  Once 01/05/15 2046 01/05/15 2121     Assessment: 49 yom presented to the hospital with toe pain. Initiated on vancomycin and zosyn per MD for osteomyelitis. Pharmacy will take over dosing vancomycin today. Pt is afebrile, WBC is 11.3 and Scr is 1.12. Current dose is borderline low. Planning amputation today.  Vanc 12/14>> Zosyn 12/14>>  Goal of Therapy:  Vancomycin trough level 15-20 mcg/ml  Plan:  - Continue vanc 1gm IV Q12H for now - check a trough tonight - F/u renal fxn, C&s, clinical [facility] and [facility]  Kamilia Carollo, Drake Leachachel Lynn 01/07/2015,12:38 PM

## 2015-01-07 NOTE — Op Note (Signed)
01/05/2015 - 01/07/2015  3:07 PM  PATIENT:  Gordon Walker    PRE-OPERATIVE DIAGNOSIS:  Left Great Toe Osteomyelitis   POST-OPERATIVE DIAGNOSIS:  Osteomyelitis left great toe with large abscess extending dorsally over the foot  PROCEDURE:  Left Foot 1st Ray Amputation Local tissue rearrangement for wound closure 5 x 8 cm  SURGEON:  Nadara MustardUDA,Taryne Kiger V, MD  PHYSICIAN ASSISTANT:None ANESTHESIA:   General  PREOPERATIVE INDICATIONS:  Gordon Walker is a  49 y.o. male with a diagnosis of Left Great Toe Osteomyelitis  who failed conservative measures and elected for surgical management.    The risks benefits and alternatives were discussed with the patient preoperatively including but not limited to the risks of infection, bleeding, nerve injury, cardiopulmonary complications, the need for revision surgery, among others, and the patient was willing to proceed.  OPERATIVE IMPLANTS: none  OPERATIVE FINDINGS: Large abscess dorsally over the foot.  OPERATIVE PROCEDURE: Patient was brought to the operating room and underwent a general anesthetic. After adequate levels anesthesia were obtained patient's left lower extremity was prepped using DuraPrep draped into a sterile field. A timeout was called. A racquet incision was made around the great toe, first metatarsal and the ulcer. This extended down to a large abscess was extended dorsally over the foot. The first ray great toe and ulcerative tissue was resected in 1 block of tissue. Additional soft tissue and skin was removed from the dorsum of the foot to resect back to tissue that was not affected by the abscess. The wound was irrigated with normal saline. Electrocautery was used for hemostasis. Local tissue rearrangement was used to close the large soft tissue defect 5 x 8 cm. The wound was closed using 2-0 nylon. A sterile compressive dressing was applied patient was taken to the PACU in stable condition.  Anticipate patient will need to be  discharged on oral antibiotics for 2 weeks. Continue IV antibiotics for at least 48 hours postoperatively.

## 2015-01-07 NOTE — Interval H&P Note (Signed)
History and Physical Interval Note:  01/07/2015 6:44 AM  Gordon Walker  has presented today for surgery, with the diagnosis of Left Great Toe Osteomyelitis   The various methods of treatment have been discussed with the patient and family. After consideration of risks, benefits and other options for treatment, the patient has consented to  Procedure(s): Left Foot 1st Ray Amputation (Left) as a surgical intervention .  The patient's history has been reviewed, patient examined, no change in status, stable for surgery.  I have reviewed the patient's chart and labs.  Questions were answered to the patient's satisfaction.     Adriel Kessen V

## 2015-01-07 NOTE — Progress Notes (Signed)
ANTIBIOTIC CONSULT NOTE  Pharmacy Consult for vancomycin Indication: osteomyelitis  No Known Allergies  Patient Measurements: Height: 6' (182.9 cm) Weight: 164 lb 6.4 oz (74.571 kg) IBW/kg (Calculated) : 77.6 Adjusted Body Weight:   Vital Signs: Temp: 99.7 F (37.6 C) (12/16 1729) Temp Source: Oral (12/16 1729) BP: 136/79 mmHg (12/16 1729) Pulse Rate: 86 (12/16 1729) Intake/Output from previous day: 12/15 0701 - 12/16 0700 In: 1080 [P.O.:1080] Out: 1400 [Urine:1400] Intake/Output from this shift:    Labs:  Recent Labs  01/05/15 1938  01/05/15 2228 01/06/15 0838 01/07/15 0415  WBC 15.3*  --   --  11.7* 11.3*  HGB 10.8*  --  11.9* 10.6* 9.9*  PLT 332  --   --  287 289  CREATININE  --   < > 1.10 1.01 1.12  < > = values in this interval not displayed. Estimated Creatinine Clearance: 86 mL/min (by C-G formula based on Cr of 1.12).  Recent Labs  01/07/15 2128  [facility]     Microbiology:   Medical History: Past Medical History  Diagnosis Date  . Diabetes mellitus without complication (HCC)     Medications:  Anti-infectives    Start     Dose/Rate Route Frequency Ordered Stop   01/07/15 0530  vancomycin (VANCOCIN) IVPB 750 mg/150 ml premix     750 mg 150 mL/hr over 60 Minutes Intravenous Every 8 hours 01/07/15 2225     01/06/15 1000  vancomycin (VANCOCIN) IVPB 1000 mg/200 mL premix  Status:  Discontinued     1,000 mg 200 mL/hr over 60 Minutes Intravenous Every 12 hours 01/06/15 0027 01/07/15 2225   01/06/15 0400  piperacillin-tazobactam (ZOSYN) IVPB 3.375 g     3.375 g 12.5 mL/hr over 240 Minutes Intravenous 3 times per day 01/06/15 0048     01/06/15 0030  piperacillin-tazobactam (ZOSYN) IVPB 3.375 g  Status:  Discontinued     3.375 g 100 mL/hr over 30 Minutes Intravenous 3 times per day 01/06/15 0027 01/06/15 0047   01/05/15 2130  vancomycin (VANCOCIN) IVPB 1000 mg/200 mL premix     1,000 mg 200 mL/hr over 60 Minutes Intravenous  Once 01/05/15  2123 01/05/15 2301   01/05/15 2100  piperacillin-tazobactam (ZOSYN) IVPB 3.375 g     3.375 g 100 mL/hr over 30 Minutes Intravenous  Once 01/05/15 2046 01/05/15 2121     Assessment: 49 yom presented to the hospital with toe pain. Initiated on vancomycin and zosyn per MD for osteomyelitis>>now per pharmacy. Pt is afebrile, WBC is 11.3 and Scr is 1.12. Current dose is borderline low. Vanco trough 10 < goal. Increase dose  Vanc 12/14>> Zosyn 12/14>>  Goal of Therapy:  Vancomycin trough level 15-20 mcg/ml  Plan:  - Vancomycin 1g IV tonight then change to 750mg  IV q8hr. - F/u renal fxn, C&s, clinical status and [facility]  Anuhea Gassner S. Merilynn Finlandobertson, PharmD, BCPS Clinical Staff Pharmacist Pager [phone]   Misty Stanleyobertson, Harley Mccartney Stillinger 01/07/2015,10:26 PM

## 2015-01-07 NOTE — Transfer of Care (Signed)
Immediate Anesthesia Transfer of Care Note  Patient: Gordon CousinFrederick Uy  Procedure(s) Performed: Procedure(s): Left Foot 1st Ray Amputation (Left)  Patient Location: PACU  Anesthesia Type:General  Level of Consciousness: awake and alert   Airway & Oxygen Therapy: Patient Spontanous Breathing and Patient connected to nasal cannula oxygen  Post-op Assessment: Report given to RN and Post -op Vital signs reviewed and stable  Post vital signs: Reviewed and stable  Last Vitals:  Filed Vitals:   01/07/15 0520 01/07/15 1516  BP: 137/74   Pulse: 78   Temp: 37.2 C 36.3 C  Resp: 18     Complications: No apparent anesthesia complications

## 2015-01-07 NOTE — Anesthesia Postprocedure Evaluation (Signed)
Anesthesia Post Note  Patient: Charletta CousinFrederick Querry  Procedure(s) Performed: Procedure(s) (LRB): Left Foot 1st Ray Amputation (Left)  Patient location during evaluation: PACU Anesthesia Type: General Level of consciousness: awake and alert Pain management: pain level controlled Vital Signs Assessment: post-procedure vital signs reviewed and stable Respiratory status: spontaneous breathing, nonlabored ventilation, respiratory function stable and patient connected to nasal cannula oxygen Cardiovascular status: blood pressure returned to baseline and stable Postop Assessment: no signs of nausea or vomiting Anesthetic complications: no    Last Vitals:  Filed Vitals:   01/07/15 0520 01/07/15 1516  BP: 137/74   Pulse: 78   Temp: 37.2 C 36.3 C  Resp: 18     Last Pain:  Filed Vitals:   01/07/15 1520  PainSc: Asleep                 Merdith Adan DAVID

## 2015-01-07 NOTE — Anesthesia Preprocedure Evaluation (Signed)
Anesthesia Evaluation  Patient identified by MRN, date of birth, ID band Patient awake    Reviewed: Allergy & Precautions, NPO status , Patient's Chart, lab work & pertinent test results  Airway Mallampati: I  TM Distance: >3 FB Neck ROM: Full    Dental   Pulmonary    Pulmonary exam normal        Cardiovascular Normal cardiovascular exam     Neuro/Psych    GI/Hepatic   Endo/Other  diabetes, Type 2, Insulin Dependent  Renal/GU      Musculoskeletal   Abdominal   Peds  Hematology   Anesthesia Other Findings   Reproductive/Obstetrics                             Anesthesia Physical Anesthesia Plan  ASA: III  Anesthesia Plan: General   Post-op Pain Management:    Induction: Intravenous  Airway Management Planned: LMA  Additional Equipment:   Intra-op Plan:   Post-operative Plan: Extubation in OR  Informed Consent: I have reviewed the patients History and Physical, chart, labs and discussed the procedure including the risks, benefits and alternatives for the proposed anesthesia with the patient or authorized representative who has indicated his/her understanding and acceptance.     Plan Discussed with: CRNA and Surgeon  Anesthesia Plan Comments:         Anesthesia Quick Evaluation

## 2015-01-07 NOTE — Anesthesia Procedure Notes (Signed)
Procedure Name: LMA Insertion Date/Time: 01/07/2015 2:29 PM Performed by: Margaree MackintoshYACOUB, Geovany Trudo B Pre-anesthesia Checklist: Patient identified, Emergency Drugs available, Suction available, Patient being monitored and Timeout performed Patient Re-evaluated:Patient Re-evaluated prior to inductionOxygen Delivery Method: Circle system utilized Preoxygenation: Pre-oxygenation with 100% oxygen Intubation Type: IV induction LMA: LMA inserted LMA Size: 4.0 Number of attempts: 1 Placement Confirmation: breath sounds checked- equal and bilateral and positive ETCO2 Tube secured with: Tape Dental Injury: Teeth and Oropharynx as per pre-operative assessment

## 2015-01-08 DIAGNOSIS — R509 Fever, unspecified: Secondary | ICD-10-CM

## 2015-01-08 LAB — COMPREHENSIVE METABOLIC PANEL
ALT: 48 U/L (ref 17–63)
ANION GAP: 7 (ref 5–15)
AST: 48 U/L — ABNORMAL HIGH (ref 15–41)
Albumin: 1.6 g/dL — ABNORMAL LOW (ref 3.5–5.0)
Alkaline Phosphatase: 231 U/L — ABNORMAL HIGH (ref 38–126)
BUN: 9 mg/dL (ref 6–20)
CHLORIDE: 97 mmol/L — AB (ref 101–111)
CO2: 29 mmol/L (ref 22–32)
Calcium: 7.8 mg/dL — ABNORMAL LOW (ref 8.9–10.3)
Creatinine, Ser: 1.29 mg/dL — ABNORMAL HIGH (ref 0.61–1.24)
Glucose, Bld: 292 mg/dL — ABNORMAL HIGH (ref 65–99)
POTASSIUM: 4.4 mmol/L (ref 3.5–5.1)
SODIUM: 133 mmol/L — AB (ref 135–145)
Total Bilirubin: 0.8 mg/dL (ref 0.3–1.2)
Total Protein: 5.9 g/dL — ABNORMAL LOW (ref 6.5–8.1)

## 2015-01-08 LAB — CBC
HCT: 28.7 % — ABNORMAL LOW (ref 39.0–52.0)
Hemoglobin: 9.5 g/dL — ABNORMAL LOW (ref 13.0–17.0)
MCH: 28.5 pg (ref 26.0–34.0)
MCHC: 33.1 g/dL (ref 30.0–36.0)
MCV: 86.2 fL (ref 78.0–100.0)
PLATELETS: 314 10*3/uL (ref 150–400)
RBC: 3.33 MIL/uL — AB (ref 4.22–5.81)
RDW: 12.2 % (ref 11.5–15.5)
WBC: 13.9 10*3/uL — ABNORMAL HIGH (ref 4.0–10.5)

## 2015-01-08 LAB — GLUCOSE, CAPILLARY
GLUCOSE-CAPILLARY: 179 mg/dL — AB (ref 65–99)
GLUCOSE-CAPILLARY: 216 mg/dL — AB (ref 65–99)
GLUCOSE-CAPILLARY: 221 mg/dL — AB (ref 65–99)

## 2015-01-08 MED ORDER — SODIUM CHLORIDE 0.9 % IV SOLN
INTRAVENOUS | Status: AC
  Administered 2015-01-08 – 2015-01-10 (×4): via INTRAVENOUS

## 2015-01-08 NOTE — Progress Notes (Signed)
Pt has temp 102 trying to reach on call for ortho [facility] but unable to reach, paged triad Dr. Janee Mornalahan, pt on vano and zosyn and given tylenol as ordered will continue to monitor.

## 2015-01-08 NOTE — Progress Notes (Signed)
[facility] PROGRESS NOTE  Gordon CousinFrederick Walker UJW:[id number] DOB: 1965/10/16 DOA: 01/05/2015  PCP: [name], MD  Brief HPI: 49 year old African-American male with a past medical history of type 1 diabetes presented with complains of pain in his left foot, and the left first toe. He was noted to have infection with suspicion for osteomyelitis on imaging studies. He was hospitalized for further management. He underwent amputation.  Past medical history:  Past Medical History  Diagnosis Date  . Diabetes mellitus without complication [facility](HCC)     Consultants: Orthopedics  Procedures: Ray amputation 12/16  Antibiotics: Vancomycin and Zosyn  Subjective: Patient developed fever overnight. He denies any increased pain in his left foot. Denies any other complaints. No cough. No nausea, vomiting.   Objective: Vital Signs  Filed Vitals:   01/07/15 1729 01/07/15 2239 01/08/15 0510 01/08/15 0658  BP: 136/79 149/85 121/65   Pulse: 86 109 91   Temp: 99.7 F (37.6 C) 99.1 F (37.3 C) 102 F (38.9 C) 99.2 F (37.3 C)  TempSrc: Oral Oral Oral Oral  Resp: 16 18 16    Height:      Weight:      SpO2: 95% 87% 82%     Intake/Output Summary (Last 24 hours) at 01/08/15 0919 Last data filed at 01/08/15 [id number]  Gross per 24 hour  Intake   2440 ml  Output   1400 ml  Net   1040 ml   Filed Weights   01/06/15 0011  Weight: 74.571 kg (164 lb 6.4 oz)    General appearance: alert, cooperative, appears stated age and no distress Resp: clear to auscultation bilaterally Cardio: regular rate and rhythm, S1, S2 normal, no murmur, click, rub or gallop GI: soft, non-tender; bowel sounds normal; no masses,  no organomegaly Extremities: Left foot is covered with a dressing. Post surgery.  Neurologic: Alert and oriented 3. No focal neurological deficits are noted.  Lab Results:  Basic Metabolic Panel:  Recent Labs Lab 01/05/15 2153 01/05/15 2228 01/06/15 0838 01/07/15 0415  01/08/15 0301  NA 127* 129* 138 133* 133*  K 4.7 4.6 4.2 3.9 4.4  CL 89* 89* 101 99* 97*  CO2 27  --  31 28 29   GLUCOSE 552* 548* 198* 206* 292*  BUN 21* 24* 12 12 9   CREATININE 1.46* 1.10 1.01 1.12 1.29*  CALCIUM 8.4*  --  8.4* 8.0* 7.8*  MG 2.2  --   --   --   --   PHOS 2.9  --   --   --   --    Liver Function Tests:  Recent Labs Lab 01/06/15 0838 01/07/15 0415 01/08/15 0301  AST 33 145* 48*  ALT 32 72* 48  ALKPHOS 146* 223* 231*  BILITOT 0.8 0.8 0.8  PROT 6.1* 6.2* 5.9*  ALBUMIN 2.1* 1.8* 1.6*   CBC:  Recent Labs Lab 01/05/15 1938 01/05/15 2228 01/06/15 0838 01/07/15 0415 01/08/15 0301  WBC 15.3*  --  11.7* 11.3* 13.9*  NEUTROABS 12.4*  --  9.3* 8.5*  --   HGB 10.8* 11.9* 10.6* 9.9* 9.5*  HCT 31.3* 35.0* 31.5* 29.2* 28.7*  MCV 82.4  --  83.3 84.9 86.2  PLT 332  --  287 289 314    CBG:  Recent Labs Lab 01/07/15 1233 01/07/15 1520 01/07/15 1729 01/07/15 2221 01/08/15 0756  GLUCAP 171* 150* 148* 354* 179*    Recent Results (from the past 240 hour(s))  Surgical pcr screen     Status: Abnormal  Collection Time: 01/06/15 10:24 PM  Result Value Ref Range Status   MRSA, PCR NEGATIVE NEGATIVE Final   Staphylococcus aureus POSITIVE (A) NEGATIVE Final    Comment:        The Xpert SA Assay (FDA approved for NASAL specimens in patients over 35 years of age), is one component of a comprehensive surveillance program.  Test performance has been validated by [facility] for patients greater than or equal to 87 year old. It is not intended to diagnose infection nor to guide or monitor treatment.      Studies/Results: No results found.  Medications:  Scheduled: . Chlorhexidine Gluconate Cloth  6 each Topical Daily  . enoxaparin (LOVENOX) injection  40 mg Subcutaneous QHS  . feeding supplement (PRO-STAT SUGAR FREE 64)  30 mL Oral Daily  . insulin aspart  0-15 Units Subcutaneous TID WC  . insulin glargine  30 Units Subcutaneous QHS  . multivitamin  with minerals  1 tablet Oral Daily  . mupirocin ointment  1 application Nasal BID  . pantoprazole  40 mg Oral Daily  . piperacillin-tazobactam (ZOSYN)  IV  3.375 g Intravenous 3 times per day  . vancomycin  750 mg Intravenous Q8H   Continuous: . sodium chloride 10 mL/hr at 01/07/15 1738   GUY:[id number] **OR** acetaminophen, ketorolac, methocarbamol **OR** methocarbamol (ROBAXIN)  IV, metoCLOPramide **OR** metoCLOPramide (REGLAN) injection, ondansetron **OR** ondansetron (ZOFRAN) IV, oxyCODONE  Assessment/Plan:  Principal Problem:   Osteomyelitis of left foot (HCC) Active Problems:   Uncontrolled diabetes mellitus with foot ulcer, with long-term current use of insulin (HCC)   Anemia   Transaminitis    Infection of the left foot with osteomyelitis involving the left first toe Patient is status post amputation. He developed fever overnight. His WBC has crept up as well. Blood cultures were drawn. Continue broad-spectrum antibiotics for now. Analgesics as needed. Arterial Dopplers have been done. ABI noted to be greater than 1. Orthopedics is following.  Fever Still most likely related to his left foot. He underwent surgery yesterday afternoon. Could've become bacteremic. Await blood cultures. Continue current antibiotics for now. Repeat CBC tomorrow.  Uncontrolled type 1 diabetes with foot ulcer Patient was hyperglycemic at the time of admission. Most likely due to infection. HbA1c is 13.5, implying very poor glycemic control. This brings into question his compliance. However, it was also noted that the patient was injecting insulin into abdomen in an area where there appears to be scar tissue. This could have prevented absorption. And this could explain his poor glycemic control. Control has been adequate in the hospital. Continue Lantus and sliding scale coverage.   Normocytic anemia Likely secondary to chronic disease. Anemia panel reviewed. No deficiencies identified. Hemoglobin  remains stable. No overt bleeding. Continue to monitor.  Pseudohyponatremia Secondary to elevated blood glucose levels. Sodium level is improved.  Transaminitis LFTs were noted to be elevated yesterday. Improved today. Etiology unclear. Could be due to infection. Patient denies any abdominal pain, and his abdomen is benign. Hepatitis panel is pending.   DVT Prophylaxis: Lovenox    Code Status: Full code  Family Communication: Discussed with the patient  Disposition Plan: Continue management as outlined above. Await blood culture reports.    LOS: 3 days   [facility]  [facility] Pager [phone] 01/08/2015, 9:19 AM  If 7PM-7AM, please contact night-coverage at [email or web address], password [facility]

## 2015-01-08 NOTE — Progress Notes (Signed)
Subjective: 1 Day Post-Op Procedure(s) (LRB): Left Foot 1st Ray Amputation (Left) Patient reports pain as mild.    Objective: Vital signs in last 24 hours: Temp:  [97.4 F (36.3 C)-102 F (38.9 C)] 99.2 F (37.3 C) (12/17 0658) Pulse Rate:  [80-109] 91 (12/17 0510) Resp:  [14-22] 16 (12/17 0510) BP: (121-187)/(65-97) 121/65 mmHg (12/17 0510) SpO2:  [82 %-100 %] 82 % (12/17 0510)  Intake/Output from previous day: 12/16 0701 - 12/17 0700 In: 2200 [P.O.:600; I.V.:300; IV Piggyback:1300] Out: 1100 [Urine:1050; Blood:50] Intake/Output this shift: Total I/O In: 240 [P.O.:240] Out: 300 [Urine:300]   Recent Labs  01/05/15 1938 01/05/15 2228 01/06/15 0838 01/07/15 0415 01/08/15 0301  HGB 10.8* 11.9* 10.6* 9.9* 9.5*    Recent Labs  01/07/15 0415 01/08/15 0301  WBC 11.3* 13.9*  RBC 3.44*  3.44* 3.33*  HCT 29.2* 28.7*  PLT 289 314    Recent Labs  01/07/15 0415 01/08/15 0301  NA 133* 133*  K 3.9 4.4  CL 99* 97*  CO2 28 29  BUN 12 9  CREATININE 1.12 1.29*  GLUCOSE 206* 292*  CALCIUM 8.0* 7.8*   No results for input(s): LABPT, INR in the last 72 hours.  dressing dry  Assessment/Plan: 1 Day Post-Op Procedure(s) (LRB): Left Foot 1st Ray Amputation (Left) Plan  :  Continue IV ABX  Possible home Monday  Erlene Devita C 01/08/2015, 11:25 AM

## 2015-01-09 LAB — COMPREHENSIVE METABOLIC PANEL
ALK PHOS: 242 U/L — AB (ref 38–126)
ALT: 43 U/L (ref 17–63)
AST: 39 U/L (ref 15–41)
Albumin: 1.5 g/dL — ABNORMAL LOW (ref 3.5–5.0)
Anion gap: 6 (ref 5–15)
BILIRUBIN TOTAL: 0.7 mg/dL (ref 0.3–1.2)
BUN: 8 mg/dL (ref 6–20)
CALCIUM: 7.8 mg/dL — AB (ref 8.9–10.3)
CO2: 27 mmol/L (ref 22–32)
CREATININE: 1.34 mg/dL — AB (ref 0.61–1.24)
Chloride: 101 mmol/L (ref 101–111)
GFR calc non Af Amer: >60 mL/min (ref >60)
Glucose, Bld: 333 mg/dL — ABNORMAL HIGH (ref 65–99)
Potassium: 4.4 mmol/L (ref 3.5–5.1)
SODIUM: 134 mmol/L — AB (ref 135–145)
TOTAL PROTEIN: 5.9 g/dL — AB (ref 6.5–8.1)

## 2015-01-09 LAB — GLUCOSE, CAPILLARY
GLUCOSE-CAPILLARY: 345 mg/dL — AB (ref 65–99)
GLUCOSE-CAPILLARY: 297 mg/dL — AB (ref 65–99)
Glucose-Capillary: 305 mg/dL — ABNORMAL HIGH (ref 65–99)
Glucose-Capillary: 314 mg/dL — ABNORMAL HIGH (ref 65–99)
Glucose-Capillary: 283 mg/dL — ABNORMAL HIGH (ref 65–99)

## 2015-01-09 LAB — CBC
HEMATOCRIT: 28.3 % — AB (ref 39.0–52.0)
HEMOGLOBIN: 9.5 g/dL — AB (ref 13.0–17.0)
MCH: 28.9 pg (ref 26.0–34.0)
MCHC: 33.6 g/dL (ref 30.0–36.0)
MCV: 86 fL (ref 78.0–100.0)
Platelets: 342 10*3/uL (ref 150–400)
RBC: 3.29 MIL/uL — AB (ref 4.22–5.81)
RDW: 12.4 % (ref 11.5–15.5)
WBC: 12.4 10*3/uL — ABNORMAL HIGH (ref 4.0–10.5)

## 2015-01-09 LAB — HEPATITIS PANEL, ACUTE
HCV Ab: <0.1 {s_co_ratio} (ref 0.0–0.9)
Hep A IgM: NEGATIVE
Hep B C IgM: NEGATIVE
Hepatitis B Surface Ag: NEGATIVE

## 2015-01-09 LAB — VANCOMYCIN, TROUGH: Vancomycin Tr: 16 ug/mL (ref 10.0–20.0)

## 2015-01-09 MED ORDER — INSULIN GLARGINE 100 UNIT/ML ~~LOC~~ SOLN
34.0000 [IU] | Freq: Every day | SUBCUTANEOUS | Status: DC
  Administered 2015-01-09: 34 [IU] via SUBCUTANEOUS
  Filled 2015-01-09 (×2): qty 0.34

## 2015-01-09 NOTE — Progress Notes (Signed)
[facility] PROGRESS NOTE  Gordon CousinFrederick Friesenhahn AVW:[id number] DOB: [birth year] DOA: 01/05/2015  PCP: [name], MD  Brief HPI: 49 year old African-American male with a past medical history of type 1 diabetes presented with complains of pain in his left foot, and the left first toe. He was noted to have infection with suspicion for osteomyelitis on imaging studies. He was hospitalized for further management. He underwent amputation.  Past medical history:  Past Medical History  Diagnosis Date  . Diabetes mellitus without complication [facility](HCC)     Consultants: Orthopedics  Procedures: Ray amputation 12/16  Antibiotics: Vancomycin and Zosyn  Subjective: Patient . Patient had another episode of fever yesterday afternoon. He otherwise feels well. Denies much pain in the left foot. No nausea or vomiting.   Objective: Vital Signs  Filed Vitals:   01/08/15 1449 01/08/15 1549 01/08/15 2158 01/09/15 0450  BP: 144/70  163/72 155/76  Pulse: 100  89 86  Temp: 102.7 F (39.3 C) 100 F (37.8 C) 99.6 F (37.6 C) 98.8 F (37.1 C)  TempSrc: Oral Oral Oral Oral  Resp: 18  20 20   Height:      Weight:      SpO2: 90%  93% 94%    Intake/Output Summary (Last 24 hours) at 01/09/15 0944 Last data filed at 01/09/15 0537  Gross per 24 hour  Intake   2865 ml  Output      0 ml  Net   2865 ml   Filed Weights   01/06/15 0011  Weight: 74.571 kg (164 lb 6.4 oz)    General appearance: alert, cooperative Resp: clear to auscultation bilaterally Cardio: regular rate and rhythm, S1, S2 normal, no murmur, click, rub or gallop GI: soft, non-tender; bowel sounds normal; no masses,  no organomegaly Extremities: Left foot is covered with a dressing. Post surgery.  Neurologic: Alert and oriented 3. No focal neurological deficits are noted.  Lab Results:  Basic Metabolic Panel:  Recent Labs Lab 01/05/15 2153 01/05/15 2228 01/06/15 0838 01/07/15 0415 01/08/15 0301 01/09/15 0340   NA 127* 129* 138 133* 133* 134*  K 4.7 4.6 4.2 3.9 4.4 4.4  CL 89* 89* 101 99* 97* 101  CO2 27  --  31 28 29 27   GLUCOSE 552* 548* 198* 206* 292* 333*  BUN 21* 24* 12 12 9 8   CREATININE 1.46* 1.10 1.01 1.12 1.29* 1.34*  CALCIUM 8.4*  --  8.4* 8.0* 7.8* 7.8*  MG 2.2  --   --   --   --   --   PHOS 2.9  --   --   --   --   --    Liver Function Tests:  Recent Labs Lab 01/06/15 0838 01/07/15 0415 01/08/15 0301 01/09/15 0340  AST 33 145* 48* 39  ALT 32 72* 48 43  ALKPHOS 146* 223* 231* 242*  BILITOT 0.8 0.8 0.8 0.7  PROT 6.1* 6.2* 5.9* 5.9*  ALBUMIN 2.1* 1.8* 1.6* 1.5*   CBC:  Recent Labs Lab 01/05/15 1938 01/05/15 2228 01/06/15 0838 01/07/15 0415 01/08/15 0301 01/09/15 0340  WBC 15.3*  --  11.7* 11.3* 13.9* 12.4*  NEUTROABS 12.4*  --  9.3* 8.5*  --   --   HGB 10.8* 11.9* 10.6* 9.9* 9.5* 9.5*  HCT 31.3* 35.0* 31.5* 29.2* 28.7* 28.3*  MCV 82.4  --  83.3 84.9 86.2 86.0  PLT 332  --  287 289 314 342    CBG:  Recent Labs Lab 01/08/15 0756 01/08/15 1202 01/08/15  1651 01/08/15 2200 01/09/15 0741  GLUCAP 179* 216* 221* 305* 314*    Recent Results (from the past 240 hour(s))  Surgical pcr screen     Status: Abnormal   Collection Time: 01/06/15 10:24 PM  Result Value Ref Range Status   MRSA, PCR NEGATIVE NEGATIVE Final   Staphylococcus aureus POSITIVE (A) NEGATIVE Final    Comment:        The Xpert SA Assay (FDA approved for NASAL specimens in patients over 18 years of age), is one component of a comprehensive surveillance program.  Test performance has been validated by [facility] for patients greater than or equal to 29 year old. It is not intended to diagnose infection nor to guide or monitor treatment.      Studies/Results: No results found.  Medications:  Scheduled: . Chlorhexidine Gluconate Cloth  6 each Topical Daily  . enoxaparin (LOVENOX) injection  40 mg Subcutaneous QHS  . feeding supplement (PRO-STAT SUGAR FREE 64)  30 mL Oral Daily    . insulin aspart  0-15 Units Subcutaneous TID WC  . insulin glargine  34 Units Subcutaneous QHS  . multivitamin with minerals  1 tablet Oral Daily  . mupirocin ointment  1 application Nasal BID  . pantoprazole  40 mg Oral Daily  . piperacillin-tazobactam (ZOSYN)  IV  3.375 g Intravenous 3 times per day  . vancomycin  750 mg Intravenous Q8H   Continuous: . sodium chloride 100 mL/hr at 01/09/15 1610   RUE:[id number] **OR** acetaminophen, methocarbamol **OR** methocarbamol (ROBAXIN)  IV, metoCLOPramide **OR** metoCLOPramide (REGLAN) injection, ondansetron **OR** ondansetron (ZOFRAN) IV, oxyCODONE  Assessment/Plan:  Principal Problem:   Osteomyelitis of left foot (HCC) Active Problems:   Uncontrolled diabetes mellitus with foot ulcer, with long-term current use of insulin (HCC)   Anemia   Transaminitis    Infection of the left foot with osteomyelitis involving the left first toe Patient is status post amputation. He developed fever 12/17. His WBC has crept up as well. Blood cultures were drawn. Continue broad-spectrum antibiotics for now. Analgesics as needed. Arterial Dopplers have been done. ABI noted to be greater than 1. Orthopedics is following.  Fever Still most likely related to his left foot. He underwent surgery 12/16. Could've become bacteremic. Await blood cultures. Continue current antibiotics for now. Repeat CBC tomorrow. WBC is improved this morning.  Uncontrolled type 1 diabetes with foot ulcer Patient was hyperglycemic at the time of admission. Most likely due to infection. HbA1c is 13.5, implying very poor glycemic control. This brings into question his compliance. However, it was also noted that the patient was injecting insulin into abdomen in an area where there appears to be scar tissue. This could have prevented absorption. And this could explain his poor glycemic control. Poorly controlled this morning. We'll increase the dose of Lantus. Continue sliding scale  coverage.   Normocytic anemia Likely secondary to chronic disease. Anemia panel reviewed. No deficiencies identified. Hemoglobin remains stable. No overt bleeding. Continue to monitor.  Pseudohyponatremia Secondary to elevated blood glucose levels. Sodium level is improved.  Transaminitis LFTs were noted to be elevated. Now normal. Etiology unclear. Could be due to infection. Patient denies any abdominal pain, and his abdomen is benign. Hepatitis panel is negative. Need to be followed as an outpatient.  DVT Prophylaxis: Lovenox    Code Status: Full code  Family Communication: Discussed with the patient  Disposition Plan: Continue management as outlined above. Await blood culture reports.    LOS: 4 days   [facility]  [facility] Pager [phone] 01/09/2015, 9:44 AM  If 7PM-7AM, please contact night-coverage at [email or web address], password [facility]

## 2015-01-09 NOTE — Progress Notes (Signed)
Oxy ir 5mg  po and 500mg  po robaxin administered for c/o left leg pain

## 2015-01-09 NOTE — Progress Notes (Signed)
ANTIBIOTIC CONSULT NOTE  Pharmacy Consult for vancomycin Indication: osteomyelitis  No Known Allergies  Patient Measurements: Height: 6' (182.9 cm) Weight: 164 lb 6.4 oz (74.571 kg) IBW/kg (Calculated) : 77.6 Adjusted Body Weight:   Vital Signs: Temp: 99.5 F (37.5 C) (12/18 2115) Temp Source: Oral (12/18 2115) BP: 150/71 mmHg (12/18 2115) Pulse Rate: 87 (12/18 2115) Intake/Output from previous day: 12/17 0701 - 12/18 0700 In: 3105 [P.O.:1058; I.V.:1847; IV Piggyback:200] Out: 300 [Urine:300] Intake/Output from this shift:    Labs:  Recent Labs  01/07/15 0415 01/08/15 0301 01/09/15 0340  WBC 11.3* 13.9* 12.4*  HGB 9.9* 9.5* 9.5*  PLT 289 314 342  CREATININE 1.12 1.29* 1.34*   Estimated Creatinine Clearance: 71.9 mL/min (by C-G formula based on Cr of 1.34).  Recent Labs  01/07/15 2128 01/09/15 2056  VANCOTROUGH 10 16     Microbiology:   Medical History: Past Medical History  Diagnosis Date  . Diabetes mellitus without complication (HCC)     Medications:  Anti-infectives    Start     Dose/Rate Route Frequency Ordered Stop   01/07/15 0530  vancomycin (VANCOCIN) IVPB 750 mg/150 ml premix     750 mg 150 mL/hr over 60 Minutes Intravenous Every 8 hours 01/07/15 2225     01/06/15 1000  vancomycin (VANCOCIN) IVPB 1000 mg/200 mL premix  Status:  Discontinued     1,000 mg 200 mL/hr over 60 Minutes Intravenous Every 12 hours 01/06/15 0027 01/07/15 2225   01/06/15 0400  piperacillin-tazobactam (ZOSYN) IVPB 3.375 g     3.375 g 12.5 mL/hr over 240 Minutes Intravenous 3 times per day 01/06/15 0048     01/06/15 0030  piperacillin-tazobactam (ZOSYN) IVPB 3.375 g  Status:  Discontinued     3.375 g 100 mL/hr over 30 Minutes Intravenous 3 times per day 01/06/15 0027 01/06/15 0047   01/05/15 2130  vancomycin (VANCOCIN) IVPB 1000 mg/200 mL premix     1,000 mg 200 mL/hr over 60 Minutes Intravenous  Once 01/05/15 2123 01/05/15 2301   01/05/15 2100   piperacillin-tazobactam (ZOSYN) IVPB 3.375 g     3.375 g 100 mL/hr over 30 Minutes Intravenous  Once 01/05/15 2046 01/05/15 2121     Assessment: 49 yom presented to the hospital with toe pain. Initiated on vancomycin and zosyn per MD for osteomyelitis>>now per pharmacy. Pt is afebrile, WBC is 12.4 and Scr is 1.34 (trend up), CrCl ~ 80 -vancomycin trough 16 on 750mg  IV q8h  Vanc 12/14>> Zosyn 12/14>>  12/17 blood x2  Goal of Therapy:  Vancomycin trough level 15-20 mcg/ml  Plan:   -No vancomycin changes needed -Will follow renal function, cultures and clinical progress  Harland Germanndrew Charisma Charlot, Pharm D 01/09/2015 10:25 PM

## 2015-01-10 ENCOUNTER — Encounter (HOSPITAL_COMMUNITY): Payer: Self-pay | Admitting: Orthopedic Surgery

## 2015-01-10 LAB — COMPREHENSIVE METABOLIC PANEL
ALBUMIN: 1.5 g/dL — AB (ref 3.5–5.0)
ALK PHOS: 204 U/L — AB (ref 38–126)
ALT: 30 U/L (ref 17–63)
AST: 23 U/L (ref 15–41)
Anion gap: 7 (ref 5–15)
BUN: 6 mg/dL (ref 6–20)
CALCIUM: 7.8 mg/dL — AB (ref 8.9–10.3)
CHLORIDE: 101 mmol/L (ref 101–111)
CO2: 28 mmol/L (ref 22–32)
CREATININE: 1.28 mg/dL — AB (ref 0.61–1.24)
GFR calc Af Amer: >60 mL/min (ref >60)
GFR calc non Af Amer: >60 mL/min (ref >60)
GLUCOSE: 162 mg/dL — AB (ref 65–99)
Potassium: 3.8 mmol/L (ref 3.5–5.1)
SODIUM: 136 mmol/L (ref 135–145)
Total Bilirubin: 0.5 mg/dL (ref 0.3–1.2)
Total Protein: 5.9 g/dL — ABNORMAL LOW (ref 6.5–8.1)

## 2015-01-10 LAB — GLUCOSE, CAPILLARY: GLUCOSE-CAPILLARY: 113 mg/dL — AB (ref 65–99)

## 2015-01-10 LAB — CBC
HCT: 27.5 % — ABNORMAL LOW (ref 39.0–52.0)
HEMOGLOBIN: 8.8 g/dL — AB (ref 13.0–17.0)
MCH: 27.8 pg (ref 26.0–34.0)
MCHC: 32 g/dL (ref 30.0–36.0)
MCV: 86.8 fL (ref 78.0–100.0)
Platelets: 381 10*3/uL (ref 150–400)
RBC: 3.17 MIL/uL — AB (ref 4.22–5.81)
RDW: 12.4 % (ref 11.5–15.5)
WBC: 9.6 10*3/uL (ref 4.0–10.5)

## 2015-01-10 MED ORDER — LEVOFLOXACIN 500 MG PO TABS
500.0000 mg | ORAL_TABLET | Freq: Every day | ORAL | Status: DC
  Administered 2015-01-10: 500 mg via ORAL
  Filled 2015-01-10: qty 1

## 2015-01-10 MED ORDER — LEVOFLOXACIN 500 MG PO TABS: 500.0000 mg | ORAL_TABLET | Freq: Every day | ORAL | Status: DC

## 2015-01-10 MED ORDER — METHOCARBAMOL 500 MG PO TABS: 500.0000 mg | ORAL_TABLET | Freq: Four times a day (QID) | ORAL | Status: DC | PRN

## 2015-01-10 MED ORDER — OXYCODONE HCL 5 MG PO TABS
5.0000 mg | ORAL_TABLET | ORAL | Status: DC | PRN
Start: 2015-01-10 — End: 2015-07-15

## 2015-01-10 MED ORDER — DOXYCYCLINE HYCLATE 100 MG PO TABS
100.0000 mg | ORAL_TABLET | Freq: Two times a day (BID) | ORAL | Status: DC
  Administered 2015-01-10: 100 mg via ORAL
  Filled 2015-01-10: qty 1

## 2015-01-10 MED ORDER — INSULIN GLARGINE 100 UNIT/ML ~~LOC~~ SOLN: 34.0000 [IU] | Freq: Every day | SUBCUTANEOUS | Status: DC

## 2015-01-10 MED ORDER — DOXYCYCLINE HYCLATE 100 MG PO TABS: 100.0000 mg | ORAL_TABLET | Freq: Two times a day (BID) | ORAL | Status: DC

## 2015-01-10 NOTE — Progress Notes (Signed)
DC home with wife, verbally understood DC  Instructions, ortho shoe, crutches and walker home with pt.

## 2015-01-10 NOTE — Progress Notes (Signed)
Inpatient Diabetes Program Recommendations  AACE/ADA: New Consensus Statement on Inpatient Glycemic Control (2015)  Target Ranges:  Prepandial:   less than 140 mg/dL      Peak postprandial:   less than 180 mg/dL (1-2 hours)      Critically ill patients:  140 - 180 mg/dL  Results for Gordon CousinRANDALL, [name] (MRN [id number]) as of 01/10/2015 10:12  Ref. Range 01/09/2015 07:41 01/09/2015 12:33 01/09/2015 17:03 01/09/2015 21:45 01/10/2015 07:54  Glucose-Capillary Latest Ref Range: 65-99 mg/dL 782314 (H) 956283 (H) 213345 (H) 297 (H) 113 (H)   Review of Glycemic Control  Diabetes history: DM1 Outpatient Diabetes medications: Lantus 30 units daily, Humalog 5-7 units with largest meal Current orders for Inpatient glycemic control: Lantus 34 units QHS, Novolog 0-15 units TID with meals  Inpatient Diabetes Program Recommendations: Insulin - Meal Coverage: Post prandial glucose is consistently elevated and patient has Type 1 diabetes and will require insulin coverage for carbohydrates consumed. Please consider ordering Novolog 4 units TID with meals for meal coverage (in addition to Novolog correction).  Thanks, Orlando PennerMarie Humna Moorehouse, RN, MSN, CDE Diabetes Coordinator Inpatient Diabetes Program [phone] (Team Pager from 8am to 5pm) [phone] ([facility] office) [phone] [facility] office) [phone] [facility] office)

## 2015-01-10 NOTE — Progress Notes (Signed)
PT EVALUATION 01/08/15 1155  PT Visit Information  Last PT Received On 01/08/15  Assistance Needed +1  History of Present Illness 49 y.o. male not s/p Lt foot 1st ray amputation due to Left Great Toe Osteomyelitis. PMH: diabetes  Precautions  Precautions Fall  Restrictions  Weight Bearing Restrictions Yes  LLE Weight Bearing NWB  Home Living  Family/patient expects to be discharged to: Private residence  Living Arrangements Spouse/significant other  Available Help at Discharge Family;Other (Comment) (spouse works)  Type of Home House  Home Access Level entry  Home Layout Two level  Alternate Level Stairs-Number of Steps 12  Alternate Level Stairs-Rails (yes rail)  Home Equipment None  Prior Function  Level of Independence Independent  Communication  Communication No difficulties  Pain Assessment  Pain Assessment 0-10  Pain Score 4  Pain Location Lt foot  Pain Descriptors / Indicators Sore  Pain Intervention(s) Limited activity within patient's tolerance;Monitored during session;Other (comment) (elevation)  Cognition  Arousal/Alertness Awake/alert  Behavior During Therapy WFL for tasks assessed/performed  Overall Cognitive Status Within Functional Limits for tasks assessed  Lower Extremity Assessment  Lower Extremity Assessment LLE deficits/detail  LLE Deficits / Details independent with lifting and moving LLE  Bed Mobility  Overal bed mobility Needs Assistance  Bed Mobility Supine to Sit  Supine to sit Supervision  Transfers  Overall transfer level Needs assistance  Equipment used Rolling walker (2 wheeled)  Transfers Sit to/from Stand  Sit to Stand Min guard  General transfer comment cues for hand position for safety, reminder for NWB LLE  Ambulation/Gait  Ambulation/Gait assistance Min guard  Ambulation Distance (Feet) 15 Feet  Assistive device Rolling walker (2 wheeled)  Gait Pattern/deviations (Hop-to pattern, NWB LLE)  General Gait Details consistent with  NWB, no loss of balance. Declining any further ambulation distance  Gait velocity decreased  Balance  Overall balance assessment Needs assistance  Sitting-balance support No upper extremity supported  Sitting balance-Leahy Scale Normal  Standing balance support Bilateral upper extremity supported  Standing balance-Leahy Scale Poor  Standing balance comment using rw  PT - End of Session  Equipment Utilized During Treatment Gait belt  Activity Tolerance Patient tolerated treatment well  Patient left in chair;with call bell/phone within reach;with family/visitor present;Other (comment) (LLE elevated)  Nurse Communication Mobility status;Weight bearing status  PT Assessment  PT Therapy Diagnosis  Difficulty walking;Abnormality of gait;Acute pain  PT Recommendation/Assessment Patient needs continued PT services  PT Problem List Decreased strength;Decreased activity tolerance;Decreased balance;Decreased mobility;Decreased knowledge of use of DME;Pain  PT Plan  PT Frequency (ACUTE ONLY) Min 5X/week  PT Treatment/Interventions (ACUTE ONLY) DME instruction;Gait training;Stair training;Functional mobility training;Therapeutic activities;Therapeutic exercise;Patient/family education  PT Recommendation  Follow Up Recommendations No PT follow up;Supervision for mobility/OOB  PT equipment Rolling walker with 5" wheels;Crutches;Other (comment) (currently using rw for amb, may need crutches for stairs)  Individuals Consulted  Consulted and Agree with Results and Recommendations Patient;Family member/caregiver  Family Member Consulted spouse  Acute Rehab PT Goals  Patient Stated Goal go home  PT Goal Formulation With patient  Time For Goal Achievement 01/22/15  Potential to Achieve Goals Good  PT Time Calculation  PT Start Time (ACUTE ONLY) 1130  PT Stop Time (ACUTE ONLY) 1158  PT Time Calculation (min) (ACUTE ONLY) 28 min  PT General Charges  $$ ACUTE PT VISIT 1 Procedure  PT Evaluation   $Initial PT Evaluation Tier I 1 Procedure  PT Treatments  $Gait Training 8-22 mins    Note populated for [employer]  Zaino late 01/10/2015 by Rollene Rotundaebecca B. Shanise Balch, PT, DPT [phone]

## 2015-01-10 NOTE — Progress Notes (Signed)
Called pt and told him we have his clippers to pick up. Tagged and placed in lost and found.

## 2015-01-10 NOTE — Care Management Note (Signed)
Case Management Note  Patient Details  Name: Gordon Walker MRN: [id number] Date of Birth: 01-Nov-1965  Subjective/Objective:                    Action/Plan:   Expected Discharge Date:                  Expected Discharge Plan:  Home/Self Care  In-House Referral:     Discharge planning Services  CM Consult  Post Acute Care Choice:  Durable Medical Equipment Choice offered to:     DME Arranged:  Walker rolling DME Agency:  [facility]  [facility]:     Status of Service:  Completed, signed off  Medicare Important Message Given:    Date Medicare IM Given:    Medicare IM give by:    Date Additional Medicare IM Given:    Additional Medicare Important Message give by:     If discussed at Long Length of Stay Meetings, dates discussed:    Additional Comments:  [name], [name] Marie, RN 01/10/2015, 10:39 AM

## 2015-01-10 NOTE — Discharge Summary (Signed)
[facility]  Physician Discharge Summary   Patient ID: Gordon CousinFrederick Walker MRN: [id number] DOB/AGE: [birth year] 49 y.o.  Admit date: 01/05/2015 Discharge date: 01/10/2015  PCP: [name], MD  DISCHARGE DIAGNOSES:  Principal Problem:   Osteomyelitis of left foot [facility](HCC) Active Problems:   Uncontrolled diabetes mellitus with foot ulcer, with long-term current use of insulin (HCC)   Anemia   Transaminitis   RECOMMENDATIONS FOR OUTPATIENT FOLLOW UP: 1. Patient to follow-up with Dr. Lajoyce Cornersuda in 2 weeks. 2. Check CBC and basic metabolic panel at follow-up   DISCHARGE CONDITION: fair  Diet recommendation: Modified carbohydrate  Filed Weights   01/06/15 0011  Weight: 74.571 kg (164 lb 6.4 oz)    INITIAL HISTORY: 49 year old African-American male with a past medical history of type 1 diabetes presented with complains of pain in his left foot, and the left first toe. He was noted to have infection with suspicion for osteomyelitis on imaging studies. He was hospitalized for further management. He underwent amputation.  Consultants: Orthopedics  Procedures: Ray amputation 12/16   HOSPITAL COURSE:   Infection of the left foot with osteomyelitis involving the left first toe Patient was admitted to the hospital. He was placed on broad-spectrum antibiotics. Patient was seen by Dr. Lajoyce Cornersuda with orthopedics. Patient is status post ray amputation. He developed fever 12/17. His WBC crept up as well. Blood cultures were drawn which are negative so far. Fevers have subsided. Transition to oral antibiotics today, doxycycline and Levaquin. Cleared by orthopedics for discharge. He will have close outpatient follow-up. Arterial Dopplers have been done. ABI noted to be greater than 1.   Fever Thought to be most likely related to his left foot. He underwent surgery 12/16. Blood cultures were negative. Fever subsided. WBC is improved.   Uncontrolled type 1 diabetes with foot ulcer Patient  was hyperglycemic at the time of admission. Most likely due to infection. HbA1c is 13.5, implying very poor glycemic control. This brings into question his compliance. However, it was also noted that the patient was injecting insulin into abdomen in an area where there appears to be scar tissue. This could have prevented absorption. And this could explain his poor glycemic control. Dose of Lantus was adjusted. He will need to follow up with his PCP for further management.   Normocytic anemia Likely secondary to chronic disease. Anemia panel reviewed. No deficiencies identified. Hemoglobin low but stable. No overt bleeding. Some degree of anemia, also due to dilution.  Pseudohyponatremia Secondary to elevated blood glucose levels. Sodium level is improved.  Transaminitis LFTs were noted to be elevated. Now normal. Could have been elevated due to infection. Patient denies any abdominal pain, and his abdomen is benign. Hepatitis panel is negative. Need to be followed as an outpatient.  Elevated creatinine He was given IV fluids. Creatinine remains stable. Will need continued monitoring as outpatient. GFR greater than 60. He could still have early stages of chronic kidney disease.  Overall stable. He tolerated his oral antibiotics. He was seen by physical therapy. No need for home health. DME has been ordered. Okay for discharge today.     PERTINENT LABS:  The results of significant diagnostics from this hospitalization (including imaging, microbiology, ancillary and laboratory) are listed below for reference.    Microbiology: Recent Results (from the past 240 hour(s))  Surgical pcr screen     Status: Abnormal   Collection Time: 01/06/15 10:24 PM  Result Value Ref Range Status   MRSA, PCR NEGATIVE NEGATIVE Final   Staphylococcus  aureus POSITIVE (A) NEGATIVE Final    Comment:        The Xpert SA Assay (FDA approved for NASAL specimens in patients over 21 years of age), is one component  of a comprehensive surveillance program.  Test performance has been validated by [facility] for patients greater than or equal to 5 year old. It is not intended to diagnose infection nor to guide or monitor treatment.   Culture, blood (routine x 2)     Status: None (Preliminary result)   Collection Time: 01/08/15  7:14 AM  Result Value Ref Range Status   Specimen Description BLOOD RIGHT ANTECUBITAL  Final   Special Requests BOTTLES DRAWN AEROBIC AND ANAEROBIC 5CC  Final   Culture NO GROWTH 2 DAYS  Final   Report Status PENDING  Incomplete  Culture, blood (routine x 2)     Status: None (Preliminary result)   Collection Time: 01/08/15  7:20 AM  Result Value Ref Range Status   Specimen Description BLOOD RIGHT HAND  Final   Special Requests BOTTLES DRAWN AEROBIC ONLY 5CC  Final   Culture NO GROWTH 2 DAYS  Final   Report Status PENDING  Incomplete     Labs: Basic Metabolic Panel:  Recent Labs Lab 01/05/15 2153  01/06/15 0838 01/07/15 0415 01/08/15 0301 01/09/15 0340 01/10/15 0437  NA 127*  < > 138 133* 133* 134* 136  K 4.7  < > 4.2 3.9 4.4 4.4 3.8  CL 89*  < > 101 99* 97* 101 101  CO2 27  --  GLUCOSE 552*  < > 198* 206* 292* 333* 162*  BUN 21*  < > CREATININE 1.46*  < > 1.01 1.12 1.29* 1.34* 1.28*  CALCIUM 8.4*  --  8.4* 8.0* 7.8* 7.8* 7.8*  MG 2.2  --   --   --   --   --   --   PHOS 2.9  --   --   --   --   --   --   < > = values in this interval not displayed. Liver Function Tests:  Recent Labs Lab 01/06/15 0838 01/07/15 0415 01/08/15 0301 01/09/15 0340 01/10/15 0437  AST 33 145* 48* 39 23  ALT 32 72* 48 43 30  ALKPHOS 146* 223* 231* 242* 204*  BILITOT 0.8 0.8 0.8 0.7 0.5  PROT 6.1* 6.2* 5.9* 5.9* 5.9*  ALBUMIN 2.1* 1.8* 1.6* 1.5* 1.5*   CBC:  Recent Labs Lab 01/05/15 1938  01/06/15 0838 01/07/15 0415 01/08/15 0301 01/09/15 0340 01/10/15 0437  WBC 15.3*  --  11.7* 11.3* 13.9* 12.4* 9.6  NEUTROABS 12.4*  --  9.3*  8.5*  --   --   --   HGB 10.8*  < > 10.6* 9.9* 9.5* 9.5* 8.8*  HCT 31.3*  < > 31.5* 29.2* 28.7* 28.3* 27.5*  MCV 82.4  --  83.3 84.9 86.2 86.0 86.8  PLT 332  --  287 289 314 342 381  < > = values in this interval not displayed.  CBG:  Recent Labs Lab 01/09/15 0741 01/09/15 1233 01/09/15 1703 01/09/15 2145 01/10/15 0754  GLUCAP 314* 283* 345* 297* 113*     IMAGING STUDIES Dg Foot Complete Left  01/05/2015  CLINICAL DATA:  Left great toe infection.  Redness and swelling. EXAM: LEFT FOOT - COMPLETE 3+ VIEW COMPARISON:  None. FINDINGS: Soft tissue irregularity and gas about the first phalanx with diffuse soft  tissue swelling. Vascular calcifications. Lucency through the proximal aspect of the distal phalanx of the great toe.absence of the medial portion the tuft of the great toe. Vascular calcifications. IMPRESSION: Soft tissue swelling with gas about the great toe, suspicious for infection, possibly with gas producing organism. Absence of the medial aspect of the tuft is suspicious for osteomyelitis. Lucency through the proximal portion of the distal phalanx could represent an atypical appearance of osteomyelitis or subacute fracture. Electronically Signed   By: Jeronimo Greaves M.D.   On: 01/05/2015 21:20    DISCHARGE EXAMINATION: Filed Vitals:   01/09/15 0450 01/09/15 1350 01/09/15 2115 01/10/15 0531  BP: 155/76 126/89 150/71 150/80  Pulse: 86 93 87 78  Temp: 98.8 F (37.1 C) 97.8 F (36.6 C) 99.5 F (37.5 C) 98.9 F (37.2 C)  TempSrc: Oral Oral Oral Oral  Resp: Height:      Weight:      SpO2: 94% 95% 95% 96%   General appearance: alert, cooperative, appears stated age and no distress Resp: clear to auscultation bilaterally Cardio: regular rate and rhythm, S1, S2 normal, no murmur, click, rub or gallop GI: soft, non-tender; bowel sounds normal; no masses,  no organomegaly Extremities: Left foot covered in a dressing.  DISPOSITION: Home  ALLERGIES: No Known  Allergies   Discharge Medication List as of 01/10/2015 12:20 PM    START taking these medications   Details  doxycycline (VIBRA-TABS) 100 MG tablet Take 1 tablet (100 mg total) by mouth every 12 (twelve) hours. For 8 more days, Starting 01/10/2015, Until Discontinued, Print    levofloxacin (LEVAQUIN) 500 MG tablet Take 1 tablet (500 mg total) by mouth daily. For 8 more days, Starting 01/10/2015, Until Discontinued, Print    methocarbamol (ROBAXIN) 500 MG tablet Take 1 tablet (500 mg total) by mouth every 6 (six) hours as needed for muscle spasms., Starting 01/10/2015, Until Discontinued, Print    oxyCODONE (OXY IR/ROXICODONE) 5 MG immediate release tablet Take 1 tablet (5 mg total) by mouth every 4 (four) hours as needed for moderate pain., Starting 01/10/2015, Until Discontinued, Print      CONTINUE these medications which have CHANGED   Details  insulin glargine (LANTUS) 100 UNIT/ML injection Inject 0.34 mLs (34 Units total) into the skin at bedtime. Sliding scale, Starting 01/10/2015, Until Discontinued, No Print      CONTINUE these medications which have NOT CHANGED   Details  insulin lispro (HUMALOG) 100 UNIT/ML injection Inject 5-7 Units into the skin See admin instructions. Takes with largest meal, Until Discontinued, Historical Med    Multiple Vitamin (MULTIVITAMIN WITH MINERALS) TABS tablet Take 1 tablet by mouth daily., Until Discontinued, Historical Med       Follow-up Information    Follow up with DUDA,MARCUS V, MD In 2 weeks.   Specialty:  Orthopedic Surgery   Contact information:   [address] [place] [address] [place] [address] [phone]       Follow up with Jearld Lesch, MD. Schedule an appointment as soon as possible for a visit in 1 week.   Specialty:  Family Medicine   Why:  post hospitalization follow up   Contact information:   [address] [place] [address] [phone]       TOTAL DISCHARGE TIME: 35 minutes  [facility]  [facility] Pager [phone]  01/10/2015, 2:58 PM

## 2015-01-10 NOTE — Progress Notes (Signed)
Patient ID: Gordon CousinFrederick Walker, male   DOB: Nov 29, 1965, 49 y.o.   MRN: [id number] Postoperative day 3 left foot first ray amputation. The dressing is clean and dry patient denies any pain anticipate he could be discharged to home at this time with nonweightbearing on the left lower extremity. I will follow-up in the office in 2 weeks.

## 2015-01-10 NOTE — Progress Notes (Signed)
Physical Therapy Treatment Patient Details Name: Gordon Walker MRN: [id number] DOB: 07-06-1965 Today's Date: 01/10/2015    History of Present Illness 49 y.o. male not s/p Lt foot 1st ray amputation due to Left Great Toe Osteomyelitis. PMH: diabetes    PT Comments    Pt was able to demonstrate safety on the stairs simulating access to his bedroom upstairs.  Wife and pt comfortable with his level of mobility.  Pt prefers Walker for general mobility due to stability, but will need crutches (or one crutch) for the steps.  Pt is scheduled to d/c home this PM.   Follow Up Recommendations  No PT follow up;Supervision for mobility/OOB     Equipment Recommendations  Rolling Walker with 5" wheels;Crutches    Recommendations for Other Services   NA     Precautions / Restrictions Precautions Precautions: Fall Precaution Comments: due to NWB status of left leg Required Braces or Orthoses: Other Brace/Splint Other Brace/Splint: DARCO shoe (ordered after our session due to pt reporting that MD mentioned a shoe, so PT called office) Restrictions Weight Bearing Restrictions: Yes LLE Weight Bearing: Non weight bearing    Mobility  Bed Mobility Overal bed mobility: Modified Independent Bed Mobility: Supine to Sit     Supine to sit: Modified independent (Device/Increase time);HOB elevated     General bed mobility comments: HOB elevated  Transfers Overall transfer level: Needs assistance Equipment used: Rolling Walker (2 wheeled) Transfers: Sit to/from Stand Sit to Stand: Min guard         General transfer comment: Min guard assist to help steady RW. Pt having difficulty getting up from low bed. Multiple attempts needed as well as verbal cues for safety.   Ambulation/Gait Ambulation/Gait assistance: Supervision Ambulation Distance (Feet): 20 Feet Assistive device: Rolling Walker (2 wheeled) Gait Pattern/deviations: Step-to pattern (hop to)     General Gait Details: Pt was  able to maintain NWB left throughout gait to the bathroom and in the stairwell.    Stairs Stairs: Yes Stairs assistance: Min guard Stair Management: One rail Right;Step to pattern;Forwards;With crutches (R rail L crutch going up, opposite down) Number of Stairs: 10 General stair comments: Visual demonstration first, then verbal cues while preforming stairs for safe crutch placement.  Wife present for education.  Spoke with wife about safest place to guard him when going up and down the steps.  Pt started as min guard assist and was able to progress to supervision both ascending and descending the steps.         Balance Overall balance assessment: Needs assistance Sitting-balance support: No upper extremity supported;Feet supported Sitting balance-Leahy Scale: Good     Standing balance support: Bilateral upper extremity supported Standing balance-Leahy Scale: Poor                      Cognition Arousal/Alertness: Awake/alert Behavior During Therapy: WFL for tasks assessed/performed Overall Cognitive Status: Within Functional Limits for tasks assessed                             Pertinent Vitals/Pain Pain Assessment: No/denies pain Pain Score: 0-No pain           PT Goals (current goals can now be found in the care plan section) Acute Rehab PT Goals Patient Stated Goal: go home Progress towards PT goals: Progressing toward goals    Frequency  Min 5X/week    PT Plan Current plan remains appropriate  End of Session Equipment Utilized During Treatment: Gait belt Activity Tolerance: Patient limited by fatigue (poor endurance) Patient left: in chair;with call bell/phone within reach;with family/visitor present     Time: 1610-9604 PT Time Calculation (min) (ACUTE ONLY): 32 min  Charges:  $Gait Training: 23-37 mins                      Rudie Rikard B. Daila Elbert, PT, DPT [phone]   01/10/2015, 11:13 AM

## 2015-01-13 LAB — CULTURE, BLOOD (ROUTINE X 2)
CULTURE: NO GROWTH
CULTURE: NO GROWTH

## 2015-07-13 ENCOUNTER — Other Ambulatory Visit (HOSPITAL_COMMUNITY): Payer: Self-pay | Admitting: Family

## 2015-07-14 ENCOUNTER — Encounter (HOSPITAL_COMMUNITY): Payer: Self-pay | Admitting: *Deleted

## 2015-07-14 MED ORDER — CEFAZOLIN SODIUM-DEXTROSE 2-4 GM/100ML-% IV SOLN
2.0000 g | INTRAVENOUS | Status: AC
  Administered 2015-07-15: 2 g via INTRAVENOUS
  Filled 2015-07-14: qty 100

## 2015-07-14 NOTE — Progress Notes (Signed)
Pt states he had a heart murmur "years ago". Never gave him any problems. Pt is type 1 diabetic. States fasting blood sugar is runs anywhere between 60-170. Instructed pt to take 1/2 of his regular dose of Lantus tonight. Instructed pt to check his blood sugar in the AM every 2 hours once he gets up until he leaves for hospital. If blood sugar is >220 take 1/2 of usual correction dose of Humalog.If blood sugar is 70 or below, treat with 1/2 cup of clear juice (apple or cranberry) and recheck blood sugar 15 minutes after drinking juice. If blood sugar continues to be 70 or below, call the Short Stay department and ask to speak to a nurse.

## 2015-07-15 ENCOUNTER — Ambulatory Visit (HOSPITAL_COMMUNITY): Payer: BLUE CROSS/BLUE SHIELD | Admitting: Certified Registered Nurse Anesthetist

## 2015-07-15 ENCOUNTER — Encounter (HOSPITAL_COMMUNITY)
Admission: RE | Disposition: A | Discharge status: Home / Self Care | Payer: Self-pay | Source: Ambulatory Visit | Attending: Orthopedic Surgery

## 2015-07-15 ENCOUNTER — Ambulatory Visit (HOSPITAL_COMMUNITY)
Admission: RE | Admit: 2015-07-15 | Discharge: 2015-07-15 | Disposition: A | Discharge status: Home / Self Care | Payer: BLUE CROSS/BLUE SHIELD | Source: Ambulatory Visit | Attending: Orthopedic Surgery | Admitting: Orthopedic Surgery

## 2015-07-15 DIAGNOSIS — E1140 Type 2 diabetes mellitus with diabetic neuropathy, unspecified: Secondary | ICD-10-CM | POA: Diagnosis not present

## 2015-07-15 DIAGNOSIS — E1169 Type 2 diabetes mellitus with other specified complication: Secondary | ICD-10-CM | POA: Diagnosis not present

## 2015-07-15 DIAGNOSIS — Z89422 Acquired absence of other left toe(s): Secondary | ICD-10-CM | POA: Insufficient documentation

## 2015-07-15 DIAGNOSIS — M869 Osteomyelitis, unspecified: Secondary | ICD-10-CM | POA: Insufficient documentation

## 2015-07-15 DIAGNOSIS — Z794 Long term (current) use of insulin: Secondary | ICD-10-CM | POA: Insufficient documentation

## 2015-07-15 DIAGNOSIS — M86672 Other chronic osteomyelitis, left ankle and foot: Secondary | ICD-10-CM

## 2015-07-15 HISTORY — DX: Essential (primary) hypertension: I10

## 2015-07-15 HISTORY — DX: Cardiac murmur, unspecified: R01.1

## 2015-07-15 HISTORY — PX: AMPUTATION: SHX166

## 2015-07-15 LAB — BASIC METABOLIC PANEL
ANION GAP: 7 (ref 5–15)
BUN: 10 mg/dL (ref 6–20)
CALCIUM: 9 mg/dL (ref 8.9–10.3)
CO2: 27 mmol/L (ref 22–32)
Chloride: 102 mmol/L (ref 101–111)
Creatinine, Ser: 1.03 mg/dL (ref 0.61–1.24)
GLUCOSE: 161 mg/dL — AB (ref 65–99)
POTASSIUM: 4.6 mmol/L (ref 3.5–5.1)
SODIUM: 136 mmol/L (ref 135–145)

## 2015-07-15 LAB — GLUCOSE, CAPILLARY
GLUCOSE-CAPILLARY: 167 mg/dL — AB (ref 65–99)
GLUCOSE-CAPILLARY: 170 mg/dL — AB (ref 65–99)

## 2015-07-15 LAB — CBC
HCT: 35.9 % — ABNORMAL LOW (ref 39.0–52.0)
HEMOGLOBIN: 11.8 g/dL — AB (ref 13.0–17.0)
MCH: 27.5 pg (ref 26.0–34.0)
MCHC: 32.9 g/dL (ref 30.0–36.0)
MCV: 83.7 fL (ref 78.0–100.0)
Platelets: 386 10*3/uL (ref 150–400)
RBC: 4.29 MIL/uL (ref 4.22–5.81)
RDW: 12.6 % (ref 11.5–15.5)
WBC: 6.1 10*3/uL (ref 4.0–10.5)

## 2015-07-15 SURGERY — AMPUTATION, FOOT, RAY
Anesthesia: Monitor Anesthesia Care | Site: Foot | Laterality: Left

## 2015-07-15 MED ORDER — MIDAZOLAM HCL 2 MG/2ML IJ SOLN
INTRAMUSCULAR | Status: AC
  Filled 2015-07-15: qty 2

## 2015-07-15 MED ORDER — HYDROMORPHONE HCL 1 MG/ML IJ SOLN: 0.2500 mg | INTRAMUSCULAR | Status: DC | PRN

## 2015-07-15 MED ORDER — 0.9 % SODIUM CHLORIDE (POUR BTL) OPTIME
TOPICAL | Status: DC | PRN
  Administered 2015-07-15: 1000 mL

## 2015-07-15 MED ORDER — PROPOFOL 10 MG/ML IV BOLUS
INTRAVENOUS | Status: AC
  Filled 2015-07-15: qty 20

## 2015-07-15 MED ORDER — OXYCODONE HCL 5 MG/5ML PO SOLN: 5.0000 mg | Freq: Once | ORAL | Status: DC | PRN

## 2015-07-15 MED ORDER — PROPOFOL 10 MG/ML IV BOLUS
INTRAVENOUS | Status: DC | PRN
  Administered 2015-07-15 (×3): 30 mg via INTRAVENOUS
  Administered 2015-07-15: 60 mg via INTRAVENOUS
  Administered 2015-07-15: 50 mg via INTRAVENOUS

## 2015-07-15 MED ORDER — ROCURONIUM BROMIDE 50 MG/5ML IV SOLN
INTRAVENOUS | Status: AC
  Filled 2015-07-15: qty 2

## 2015-07-15 MED ORDER — FENTANYL CITRATE (PF) 250 MCG/5ML IJ SOLN
INTRAMUSCULAR | Status: AC
  Filled 2015-07-15: qty 5

## 2015-07-15 MED ORDER — ACETAMINOPHEN 160 MG/5ML PO SOLN: 325.0000 mg | ORAL | Status: DC | PRN

## 2015-07-15 MED ORDER — LACTATED RINGERS IV SOLN
INTRAVENOUS | Status: DC
  Administered 2015-07-15: 11:00:00 via INTRAVENOUS

## 2015-07-15 MED ORDER — MIDAZOLAM HCL 2 MG/2ML IJ SOLN
2.0000 mg | Freq: Once | INTRAMUSCULAR | Status: AC
  Administered 2015-07-15: 2 mg via INTRAVENOUS

## 2015-07-15 MED ORDER — LIDOCAINE 2% (20 MG/ML) 5 ML SYRINGE
INTRAMUSCULAR | Status: AC
  Filled 2015-07-15: qty 15

## 2015-07-15 MED ORDER — FENTANYL CITRATE (PF) 100 MCG/2ML IJ SOLN
INTRAMUSCULAR | Status: DC
Start: 2015-07-15 — End: 2015-07-15
  Filled 2015-07-15: qty 2

## 2015-07-15 MED ORDER — FENTANYL CITRATE (PF) 100 MCG/2ML IJ SOLN
100.0000 ug | Freq: Once | INTRAMUSCULAR | Status: AC
  Administered 2015-07-15: 100 ug via INTRAVENOUS

## 2015-07-15 MED ORDER — ONDANSETRON HCL 4 MG/2ML IJ SOLN
INTRAMUSCULAR | Status: AC
  Filled 2015-07-15: qty 6

## 2015-07-15 MED ORDER — LACTATED RINGERS IV SOLN
INTRAVENOUS | Status: DC | PRN
  Administered 2015-07-15: 12:00:00 via INTRAVENOUS

## 2015-07-15 MED ORDER — CHLORHEXIDINE GLUCONATE 4 % EX LIQD: 60.0000 mL | Freq: Once | CUTANEOUS | Status: DC

## 2015-07-15 MED ORDER — OXYCODONE HCL 5 MG PO TABS: 5.0000 mg | ORAL_TABLET | Freq: Once | ORAL | Status: DC | PRN

## 2015-07-15 MED ORDER — HYDROCODONE-ACETAMINOPHEN 5-325 MG PO TABS: 1.0000 | ORAL_TABLET | Freq: Four times a day (QID) | ORAL | Status: DC | PRN

## 2015-07-15 MED ORDER — ACETAMINOPHEN 325 MG PO TABS: 325.0000 mg | ORAL_TABLET | ORAL | Status: DC | PRN

## 2015-07-15 SURGICAL SUPPLY — 28 items
BLADE SAW SGTL MED 73X18.5 STR (BLADE) ×2 IMPLANT
BNDG COHESIVE 4X5 TAN STRL (GAUZE/BANDAGES/DRESSINGS) ×2 IMPLANT
BNDG GAUZE ELAST 4 BULKY (GAUZE/BANDAGES/DRESSINGS) ×2 IMPLANT
COVER SURGICAL LIGHT HANDLE (MISCELLANEOUS) ×4 IMPLANT
DRAPE U-SHAPE 47X51 STRL (DRAPES) ×4 IMPLANT
DRSG KUZMA FLUFF (GAUZE/BANDAGES/DRESSINGS) ×4 IMPLANT
DRSG PAD ABDOMINAL 8X10 ST (GAUZE/BANDAGES/DRESSINGS) ×4 IMPLANT
DURAPREP 26ML APPLICATOR (WOUND CARE) ×2 IMPLANT
ELECT REM PT RETURN 9FT ADLT (ELECTROSURGICAL) ×2
ELECTRODE REM PT RTRN 9FT ADLT (ELECTROSURGICAL) ×1 IMPLANT
GLOVE BIOGEL PI IND STRL 9 (GLOVE) ×1 IMPLANT
GLOVE BIOGEL PI INDICATOR 9 (GLOVE) ×1
GLOVE SURG ORTHO 9.0 STRL STRW (GLOVE) ×2 IMPLANT
GOWN STRL REUS W/ TWL LRG LVL3 (GOWN DISPOSABLE) ×1 IMPLANT
GOWN STRL REUS W/ TWL XL LVL3 (GOWN DISPOSABLE) ×2 IMPLANT
GOWN STRL REUS W/TWL LRG LVL3 (GOWN DISPOSABLE) ×1
GOWN STRL REUS W/TWL XL LVL3 (GOWN DISPOSABLE) ×2
KIT BASIN OR (CUSTOM PROCEDURE TRAY) ×2 IMPLANT
KIT ROOM TURNOVER OR (KITS) ×2 IMPLANT
NS IRRIG 1000ML POUR BTL (IV SOLUTION) ×2 IMPLANT
PACK ORTHO EXTREMITY (CUSTOM PROCEDURE TRAY) ×2 IMPLANT
PAD ABD 8X10 STRL (GAUZE/BANDAGES/DRESSINGS) ×2 IMPLANT
SPONGE LAP 18X18 X RAY DECT (DISPOSABLE) ×2 IMPLANT
STOCKINETTE IMPERVIOUS LG (DRAPES) IMPLANT
SUT ETHILON 2 0 PSLX (SUTURE) ×6 IMPLANT
TOWEL OR 17X24 6PK STRL BLUE (TOWEL DISPOSABLE) ×2 IMPLANT
TOWEL OR 17X26 10 PK STRL BLUE (TOWEL DISPOSABLE) ×2 IMPLANT
WATER STERILE IRR 1000ML POUR (IV SOLUTION) ×2 IMPLANT

## 2015-07-15 NOTE — Transfer of Care (Signed)
Immediate Anesthesia Transfer of Care Note  Patient: Gordon Walker  Procedure(s) Performed: Procedure(s): Left Transmetatarsal Amputation (Left)  Patient Location: PACU  Anesthesia Type:MAC  Level of Consciousness: awake, alert  and oriented  Airway & Oxygen Therapy: Patient Spontanous Breathing and Patient connected to nasal cannula oxygen  Post-op Assessment: Report given to RN, Post -op Vital signs reviewed and stable and Patient moving all extremities X 4  Post vital signs: Reviewed and stable  Last Vitals:  Filed Vitals:   07/15/15 1235 07/15/15 1239  BP: 156/66 135/80  Pulse: 71 76  Temp:    Resp: 16 10    Last Pain: There were no vitals filed for this visit.    Patients Stated Pain Goal: 5 (07/15/15 1045)  Complications: No apparent anesthesia complications

## 2015-07-15 NOTE — Anesthesia Preprocedure Evaluation (Addendum)
Anesthesia Evaluation  Patient identified by MRN, date of birth, ID band Patient awake    Reviewed: Allergy & Precautions, NPO status , Patient's Chart, lab work & pertinent test results  History of Anesthesia Complications Negative for: history of anesthetic complications  Airway Mallampati: II  TM Distance: >3 FB Neck ROM: Full    Dental  (+) Chipped, Teeth Intact,    Pulmonary neg pulmonary ROS,    Pulmonary exam normal breath sounds clear to auscultation       Cardiovascular hypertension, Normal cardiovascular exam+ Valvular Problems/Murmurs  Rhythm:Regular     Neuro/Psych negative neurological ROS  negative psych ROS   GI/Hepatic negative GI ROS, Neg liver ROS,   Endo/Other  diabetes, Type 2, Insulin Dependent  Renal/GU negative Renal ROS     Musculoskeletal   Abdominal   Peds  Hematology  (+) anemia ,   Anesthesia Other Findings   Reproductive/Obstetrics                           Anesthesia Physical  Anesthesia Plan  ASA: III  Anesthesia Plan: MAC and Regional   Post-op Pain Management:    Induction: Intravenous  Airway Management Planned: Natural Airway, Nasal Cannula and Simple Face Mask  Additional Equipment: None  Intra-op Plan:   Post-operative Plan:   Informed Consent: I have reviewed the patients History and Physical, chart, labs and discussed the procedure including the risks, benefits and alternatives for the proposed anesthesia with the patient or authorized representative who has indicated his/her understanding and acceptance.   Dental advisory given  Plan Discussed with: CRNA and Surgeon  Anesthesia Plan Comments:       Anesthesia Quick Evaluation

## 2015-07-15 NOTE — H&P (Signed)
Gordon CousinFrederick Walker is an 50 y.o. male.   Chief Complaint: Ulceration osteomyelitis left forefoot HPI: Patient is a 50 year old gentleman with diabetic insensate neuropathy who has failed conservative wound care and presents at this time for a left transmetatarsal amputation.  Past Medical History  Diagnosis Date  . Heart murmur     "years ago"  . Hypertension     "years ago"  . Diabetes mellitus without complication (HCC)     Type 1    Past Surgical History  Procedure Laterality Date  . Amputation Left 01/07/2015    Procedure: Left Foot 1st Ray Amputation;  Surgeon: Nadara MustardMarcus Duda V, MD;  Location: [facility] OR;  Service: Orthopedics;  Laterality: Left;    Family History  Problem Relation Age of Onset  . Hypertension Mother   . Diabetes Mother   . Cancer Mother   . Prostate cancer Father    Social History:  reports that he has never smoked. He has never used smokeless tobacco. He reports that he does not drink alcohol or use illicit drugs.  Allergies: No Known Allergies  No prescriptions prior to admission    No results found for this or any previous visit (from the past 48 hour(s)). No results found.  Review of Systems  All other systems reviewed and are negative.   There were no vitals taken for this visit. Physical Exam  On examination patient has a palpable dorsalis pedis pulse. There is ulceration with osteomyelitis of the left forefoot. 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  Plan: We'll plan for left transmetatarsal amputation. Risk and benefits were discussed including need for higher level amputation. Patient states he understands wishes to proceed at this time.  Nadara MustardUDA,MARCUS V, MD 07/15/2015, 7:23 AM

## 2015-07-15 NOTE — Op Note (Signed)
07/15/2015  1:09 PM  PATIENT:  Gordon Walker    PRE-OPERATIVE DIAGNOSIS:  Osteomyelitis Left Forefoot  POST-OPERATIVE DIAGNOSIS:  Same  PROCEDURE:  Left Transmetatarsal Amputation  SURGEON:  Nadara MustardUDA,Alera Quevedo V, MD  PHYSICIAN ASSISTANT:None ANESTHESIA:   General  PREOPERATIVE INDICATIONS:  Gordon Walker is a  50 y.o. male with a diagnosis of Osteomyelitis Left Forefoot who failed conservative measures and elected for surgical management.    The risks benefits and alternatives were discussed with the patient preoperatively including but not limited to the risks of infection, bleeding, nerve injury, cardiopulmonary complications, the need for revision surgery, among others, and the patient was willing to proceed.  OPERATIVE IMPLANTS: None  OPERATIVE FINDINGS: Minimal petechial bleeding  OPERATIVE PROCEDURE: Patient brought the operating room after undergoing a popliteal block. After adequate levels anesthesia obtained patient's left lower extremity was prepped using DuraPrep draped into a sterile field a timeout was called. A fishmouth incision was made through the metatarsal head region just proximal to the plantar ulceration. Using a oscillating saw patient underwent a transmetatarsal amputation with the bone was beveled plantarly. The ulcerative tissue and forefoot were removed in 1 block of tissue. Patient underwent irrigation , electrocautery was used for hemostasis.  The incision was closed using 2-0 nylon a sterile compressive dressing was applied patient was taken to the PACU in stable condition plan for discharge to home prescription for Vicodin for pain.

## 2015-07-15 NOTE — Anesthesia Postprocedure Evaluation (Signed)
Anesthesia Post Note  Patient: Gordon Walker  Procedure(s) Performed: Procedure(s) (LRB): Left Transmetatarsal Amputation (Left)  Patient location during evaluation: PACU Anesthesia Type: MAC and Regional Level of consciousness: awake and alert Pain management: pain level controlled Vital Signs Assessment: post-procedure vital signs reviewed and stable Respiratory status: spontaneous breathing, nonlabored ventilation, respiratory function stable and patient connected to nasal cannula oxygen Cardiovascular status: stable and blood pressure returned to baseline Anesthetic complications: no    Last Vitals:  Filed Vitals:   07/15/15 1339 07/15/15 1409  BP: 165/79 160/84  Pulse: 95 91  Temp:    Resp: 11 15    Last Pain: There were no vitals filed for this visit.               [name], Alder Murri E

## 2015-07-15 NOTE — Anesthesia Procedure Notes (Addendum)
Anesthesia Regional Block:  Popliteal block  Pre-Anesthetic Checklist: ,, timeout performed, Correct Patient, Correct Site, Correct Laterality, Correct Procedure, Correct Position, site marked, Risks and benefits discussed,  Surgical consent,  Pre-op evaluation,  At surgeon's request and post-op pain management  Laterality: Lower and Left  Prep: chloraprep       Needles:  Injection technique: Single-shot  Needle Type: Echogenic Stimulator Needle          Additional Needles:  Procedures: ultrasound guided (picture in chart) and nerve stimulator Popliteal block  Nerve Stimulator or Paresthesia:  Response: plantar, 0.5 mA,   Additional Responses:   Narrative:  Injection made incrementally with aspirations every 5 mL.  Performed by: Personally  Anesthesiologist: MOSER, CHRISTOPHER  Additional Notes: H+P and labs reviewed, risks and benefits discussed with patient, procedure tolerated well without complications   Procedure Name: MAC Date/Time: 07/15/2015 12:54 PM Performed by: Carmela RimaMARTINELLI, Khloi Rawl F Pre-anesthesia Checklist: Timeout performed, Patient being monitored, Suction available, Emergency Drugs available and Patient identified Patient Re-evaluated:Patient Re-evaluated prior to inductionOxygen Delivery Method: Nasal cannula Placement Confirmation: positive ETCO2

## 2015-07-18 ENCOUNTER — Encounter (HOSPITAL_COMMUNITY): Payer: Self-pay | Admitting: Orthopedic Surgery

## 2015-10-03 ENCOUNTER — Encounter: Payer: Self-pay | Admitting: Internal Medicine

## 2015-10-27 ENCOUNTER — Ambulatory Visit (INDEPENDENT_AMBULATORY_CARE_PROVIDER_SITE_OTHER): Payer: BLUE CROSS/BLUE SHIELD | Admitting: Orthopedic Surgery

## 2015-10-27 DIAGNOSIS — L97411 Non-pressure chronic ulcer of right heel and midfoot limited to breakdown of skin: Secondary | ICD-10-CM

## 2015-10-27 DIAGNOSIS — I87322 Chronic venous hypertension (idiopathic) with inflammation of left lower extremity: Secondary | ICD-10-CM

## 2015-10-27 DIAGNOSIS — Z89432 Acquired absence of left foot: Secondary | ICD-10-CM | POA: Diagnosis not present

## 2015-10-28 ENCOUNTER — Encounter: Payer: Self-pay | Admitting: Endocrinology

## 2015-10-28 ENCOUNTER — Ambulatory Visit (INDEPENDENT_AMBULATORY_CARE_PROVIDER_SITE_OTHER): Payer: BLUE CROSS/BLUE SHIELD | Admitting: Endocrinology

## 2015-10-28 DIAGNOSIS — E1140 Type 2 diabetes mellitus with diabetic neuropathy, unspecified: Secondary | ICD-10-CM

## 2015-10-28 DIAGNOSIS — E1169 Type 2 diabetes mellitus with other specified complication: Secondary | ICD-10-CM | POA: Insufficient documentation

## 2015-10-28 DIAGNOSIS — Z89432 Acquired absence of left foot: Secondary | ICD-10-CM

## 2015-10-28 DIAGNOSIS — E1052 Type 1 diabetes mellitus with diabetic peripheral angiopathy with gangrene: Secondary | ICD-10-CM | POA: Insufficient documentation

## 2015-10-28 DIAGNOSIS — E11319 Type 2 diabetes mellitus with unspecified diabetic retinopathy without macular edema: Secondary | ICD-10-CM | POA: Diagnosis not present

## 2015-10-28 DIAGNOSIS — E1121 Type 2 diabetes mellitus with diabetic nephropathy: Secondary | ICD-10-CM | POA: Diagnosis not present

## 2015-10-28 DIAGNOSIS — Z794 Long term (current) use of insulin: Secondary | ICD-10-CM

## 2015-10-28 DIAGNOSIS — E1152 Type 2 diabetes mellitus with diabetic peripheral angiopathy with gangrene: Secondary | ICD-10-CM | POA: Insufficient documentation

## 2015-10-28 MED ORDER — INSULIN GLARGINE 100 UNIT/ML ~~LOC~~ SOLN: 30.0000 [IU] | Freq: Every day | SUBCUTANEOUS | 11 refills | Status: DC

## 2015-10-28 MED ORDER — GLUCAGON (RDNA) 1 MG IJ KIT: 1.0000 mg | PACK | Freq: Once | INTRAMUSCULAR | 12 refills | Status: DC | PRN

## 2015-10-28 MED ORDER — INSULIN ASPART 100 UNIT/ML FLEXPEN: 8.0000 [IU] | PEN_INJECTOR | Freq: Three times a day (TID) | SUBCUTANEOUS | 11 refills | Status: DC

## 2015-10-28 NOTE — Patient Instructions (Addendum)
good diet and exercise significantly improve the control of your diabetes.  please let me know if you wish to be referred to a dietician.  high blood sugar is very risky to your health.  you should see an eye doctor and dentist every year.  It is very important to get all recommended vaccinations.  Controlling your blood pressure and cholesterol drastically reduces the damage diabetes does to your body.  Those who smoke should quit.  Please discuss these with your doctor.  check your blood sugar twice a day.  vary the time of day when you check, between before the 3 meals, and at bedtime.  also check if you have symptoms of your blood sugar being too high or too low.  please keep a record of the readings and bring it to your next appointment here (or you can bring the meter itself).  You can write it on any piece of paper.  please call us sooner if your blood sugar goes below 70, or if you have a lot of readings over 200. For now, please: Reduce the lantus to 30 units at bedtime, and: Increase the novolog to 8 units 3 times a day (just before each meal).  You can take 1 extra unit for a high blood sugar (300), or for a larger than usual meal.   i have sent a prescription to your pharmacy, for a medication called "glucagon."  This is a single-use emergency kit.  It will help you when your blood sugar is so low, you need someone else to help you.  The side-effect is nausea, so you should be turned on your side after getting this shot.  Please come back for a follow-up appointment in 2 months.

## 2015-10-28 NOTE — Progress Notes (Signed)
Subjective:    Patient ID: Gordon CousinFrederick Walker, male    DOB: [birth year], 50 y.o.   MRN: [id number]  HPI pt states DM was dx'ed in 1988; he has mild neuropathy of the lower extremities; he has associated left foot amputation, nephropathy, and retinopathy; he has been on insulin since dx; pt says his diet and exercise are good; he has never had pancreatitis or DKA.   He has had several episodes of severe hypoglycemia (last episode was Sept, 2017).  This happens in the fasting state.  He takes multiple daily injections.  He says he never misses the insulin.  He takes lantus 34 units at bedtime, and humalog, 6-8 units (ss), 3 times a day (just before each meal).  He says cbg's are highest at HS.   Past Medical History:  Diagnosis Date  . Diabetes mellitus without complication (HCC)    Type 1  . Heart murmur    "years ago"  . Hypertension    "years ago"  . 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, with long-term current use of insulin [facility](HCC)     Past Surgical History:  Procedure Laterality Date  . AMPUTATION Left 01/07/2015   Procedure: Left Foot 1st Ray Amputation;  Surgeon: Nadara MustardMarcus Duda V, MD;  [facility];  Service: Orthopedics;  Laterality: Left;  . AMPUTATION Left 07/15/2015   Procedure: Left Transmetatarsal Amputation;  Surgeon: Nadara MustardMarcus V Duda, MD;  Location: [facility] OR;  Service: Orthopedics;  Laterality: Left;    Social History   Social History  . Marital status: Married    Spouse name: N/A  . Number of children: N/A  . Years of education: N/A   Occupational History  . Not on file.   Social History Main Topics  . Smoking status: Never Smoker  . Smokeless tobacco: Never Used  . Alcohol use No  . Drug use: No  . Sexual activity: Not on file   Other Topics Concern  . Not on file   Social History Narrative  . No narrative on file    Current Outpatient Prescriptions on File Prior to Visit  Medication Sig Dispense Refill  . Multiple  Vitamin (MULTIVITAMIN WITH MINERALS) TABS tablet Take 1 tablet by mouth daily.     No current facility-administered medications on file prior to visit.     No Known Allergies  Family History  Problem Relation Age of Onset  . Hypertension Mother   . Diabetes Mother   . Cancer Mother   . Prostate cancer Father     BP 118/72   Pulse 81   Ht 5\' 11"  (1.803 m)   Wt 188 lb (85.3 kg)   SpO2 97%   BMI 26.22 kg/m   Review of Systems denies blurry vision, headache, chest pain, sob, n/v, urinary frequency, muscle cramps, excessive diaphoresis, depression, cold intolerance, rhinorrhea, and easy bruising.  He has gained a few lbs.      Objective:   Physical Exam VS: see vs page GEN: no distress HEAD: head: no deformity eyes: no periorbital swelling, no proptosis external nose and ears are normal mouth: no lesion seen NECK: supple, thyroid is not enlarged CHEST WALL: no deformity LUNGS: clear to auscultation CV: reg rate and rhythm, no murmur ABD: abdomen is soft, nontender.  no hepatosplenomegaly.  not distended.  no hernia.   MUSCULOSKELETAL: muscle bulk and strength are grossly normal.  no obvious joint swelling.  gait is normal and steady.   EXTEMITIES: no deformity.  no ulcer on the feet.  feet are of normal color and temp.  Trace bilat leg edema.  Left foot transmetatarsal amputation, well-healed.   PULSES: dorsalis pedis intact bilat.  no carotid bruit NEURO:  cn 2-12 grossly intact.   readily moves all 4's.  sensation is intact to touch on the feet SKIN:  Normal texture and temperature.  No rash or suspicious lesion is visible.   NODES:  None palpable at the neck PSYCH: alert, well-oriented.  Does not appear anxious nor depressed.    i personally reviewed electrocardiogram tracing (07/15/15): Indication: preop Impression: normal  A1c=7.8%  I have reviewed outside records, and summarized: Pt was noted to have elevated a1c, and referred here.  Also, he was rx'ed asa, and  was advised to take statin rx.     Assessment & Plan:  Type 1 DM: new to me.  The pattern of his cbg's indicates he needs some adjustment in his therapy Severe hypoglycemia: in this setting, I'll increase insulin slowly, if at all.

## 2015-11-24 ENCOUNTER — Encounter (INDEPENDENT_AMBULATORY_CARE_PROVIDER_SITE_OTHER): Payer: Self-pay | Admitting: Orthopedic Surgery

## 2015-11-24 ENCOUNTER — Ambulatory Visit (INDEPENDENT_AMBULATORY_CARE_PROVIDER_SITE_OTHER): Payer: BLUE CROSS/BLUE SHIELD | Admitting: Orthopedic Surgery

## 2015-11-24 VITALS — Ht 72.0 in | Wt 174.0 lb

## 2015-11-24 DIAGNOSIS — Z89432 Acquired absence of left foot: Secondary | ICD-10-CM

## 2015-11-24 DIAGNOSIS — E1042 Type 1 diabetes mellitus with diabetic polyneuropathy: Secondary | ICD-10-CM

## 2015-11-24 NOTE — Progress Notes (Addendum)
Wound Care Note   Patient: Gordon CousinFrederick Mofield           Date of Birth: 1965/06/18           MRN: [id number]             PCP: Darrow BussingKOIRALA,DIBAS, MD Visit Date: 11/24/2015   Assessment & Plan: Visit Diagnoses:  1. Status post transmetatarsal amputation of foot, left [facility](HCC)    Patient is 4 months status post left transmetatarsal amputation. Plan: Plan to follow-up in the office in 4 weeks. Patient is use Bactroban and a Band-Aid over the base of the fifth metatarsal head ulcer continue with his nitroglycerin patches.   Follow-Up Instructions: Return in about 4 weeks (around 12/22/2015).  Orders:  No orders of the defined types were placed in this encounter.  No orders of the defined types were placed in this encounter.     Procedures: No notes on file   Clinical Data: No additional findings.   No images are attached to the encounter.   Subjective: Chief Complaint  Patient presents with  . Left Foot - Wound Check  . Wound Check    left transmetatarsal amputation 07/15/15    Patient presents today for 4 week follow up of left transmetatarsal amputation ulceration. He is over 4 months out from surgery. He has a ulceration lateral side of residual limb. He is using nitroglycerin patches daily and applying silvadene dressing daily. There is no drainage, there is no redness or swelling. Patient is overall doing well.   Wound Check     Review of Systems  Miscellaneous:  -Home Health Care: no  -Physical Therapy: no  -Out of Work?: no   Objective: Vital Signs: Ht 6' (1.829 m)   Wt 174 lb (78.9 kg)   BMI 23.60 kg/m   Physical Exam: Patient is alert oriented no adenopathy well-dressed normal affect normal respiratory effort.  Patient does have an antalgic gait. Left lower extremity has a faint pulse does not have protective sensation is no redness no cellulitis no swelling no signs of infection no gangrenous changes. He has an ulcer over the base of the fifth metatarsal  Wagner grade 1 which is 10 mm in diameter 1 mm deep which has healthy viable tissue.  Specialty Comments: No specialty comments available.   PMFS History: Patient Active Problem List   Diagnosis Date Noted  . Status post transmetatarsal amputation of foot, left (HCC) 11/24/2015  . Controlled diabetes mellitus with diabetic peripheral angiopathy and gangrene (HCC)   . 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 Transaminitis 01/07/2015  . Osteomyelitis of left foot (HCC) 01/06/2015  . Uncontrolled diabetes mellitus with foot ulcer, with long-term current use of insulin (HCC) 01/06/2015  . Anemia 01/06/2015  . Osteomyelitis (HCC) 01/05/2015   Past Medical History:  Diagnosis Date  . Diabetes mellitus without complication (HCC)    Type 1  . Heart murmur    "years ago"  . Hypertension    "years ago"  . 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, with long-term current use of insulin (HCC)     Family History  Problem Relation Age of Onset  . Hypertension Mother   . Diabetes Mother   . Cancer Mother   . Prostate cancer Father    Past Surgical History:  Procedure Laterality Date  . AMPUTATION Left 01/07/2015   Procedure: Left Foot 1st Ray Amputation;  Surgeon: Nadara MustardMarcus Duda V, MD;  [facility];  Service: Orthopedics;  Laterality: Left;  . AMPUTATION Left 07/15/2015   Procedure: Left Transmetatarsal Amputation;  Surgeon: Nadara MustardMarcus V Duda, MD;  Location: [facility] OR;  Service: Orthopedics;  Laterality: Left;   Social History   Occupational History  . Not on file.   Social History Main Topics  . Smoking status: Never Smoker  . Smokeless tobacco: Never Used  . Alcohol use No  . Drug use: No  . Sexual activity: Not on file

## 2015-12-19 ENCOUNTER — Telehealth (INDEPENDENT_AMBULATORY_CARE_PROVIDER_SITE_OTHER): Payer: Self-pay | Admitting: Orthopedic Surgery

## 2015-12-19 ENCOUNTER — Other Ambulatory Visit (INDEPENDENT_AMBULATORY_CARE_PROVIDER_SITE_OTHER): Payer: Self-pay | Admitting: Orthopedic Surgery

## 2015-12-19 MED ORDER — SULFAMETHOXAZOLE-TRIMETHOPRIM 800-160 MG PO TABS: 1.0000 | ORAL_TABLET | Freq: Two times a day (BID) | ORAL | 0 refills | Status: DC

## 2015-12-19 NOTE — Telephone Encounter (Signed)
Patient needing a refill of antibiotic sent to pharmacy. Has appt on Friday, but blister came up and a week ago it burst, he has been using silverdean on it and covering it. No pain, healthy tissue growing over it. CB# [phone]

## 2015-12-19 NOTE — Telephone Encounter (Signed)
I called patient to make him aware bactrim sent to pharmacy.

## 2015-12-19 NOTE — Telephone Encounter (Signed)
Prescription written and sent to pharmacy.

## 2015-12-22 ENCOUNTER — Ambulatory Visit (INDEPENDENT_AMBULATORY_CARE_PROVIDER_SITE_OTHER): Payer: BLUE CROSS/BLUE SHIELD | Admitting: Family

## 2015-12-22 ENCOUNTER — Encounter (INDEPENDENT_AMBULATORY_CARE_PROVIDER_SITE_OTHER): Payer: Self-pay | Admitting: Family

## 2015-12-22 ENCOUNTER — Ambulatory Visit (INDEPENDENT_AMBULATORY_CARE_PROVIDER_SITE_OTHER): Payer: BLUE CROSS/BLUE SHIELD | Admitting: Orthopedic Surgery

## 2015-12-22 DIAGNOSIS — L97509 Non-pressure chronic ulcer of other part of unspecified foot with unspecified severity: Secondary | ICD-10-CM | POA: Diagnosis not present

## 2015-12-22 DIAGNOSIS — E0865 Diabetes mellitus due to underlying condition with hyperglycemia: Secondary | ICD-10-CM

## 2015-12-22 DIAGNOSIS — Z794 Long term (current) use of insulin: Secondary | ICD-10-CM | POA: Diagnosis not present

## 2015-12-22 DIAGNOSIS — IMO0002 Reserved for concepts with insufficient information to code with codable children: Secondary | ICD-10-CM

## 2015-12-22 DIAGNOSIS — E08621 Diabetes mellitus due to underlying condition with foot ulcer: Secondary | ICD-10-CM

## 2015-12-22 NOTE — Progress Notes (Signed)
Wound Care Note   Patient: Gordon Walker           Date of Birth: 1965/11/19           MRN: [id number]             PCP: Darrow BussingKOIRALA,DIBAS, MD Visit Date: 12/22/2015   Assessment & Plan: Visit Diagnoses:  1. Diabetes mellitus due to underlying condition, uncontrolled, with foot ulcer, with long-term current use of insulin (HCC)     Plan: Proceed with daily wound care. Santyl ointment to exudative tissue to right foot ulcer. Continue with antibiotic ointment to left foot ulcer. Fleet Bactrim course. Follow-up in office in 2 more weeks.  Follow-Up Instructions: Return in about 2 weeks (around 01/05/2016).  Orders:  No orders of the defined types were placed in this encounter.  No orders of the defined types were placed in this encounter.     Procedures: No notes on file   Clinical Data: No additional findings.   No images are attached to the encounter.   Subjective: Chief Complaint  Patient presents with  . Left Foot - Follow-up    Transmetatarsal amputation 07/15/15  . Right Foot - Wound Check    Patient is a 50 year old gentleman seen today for a new ulceration to the right lateral foot, 5th metatarsal. He had called office with the on call last week, started on bactrim DS. He is applying silvadene dressing changes daily to right foot. He is using nitroglycerin patch.   He is also following up left transmetatarsal amputation with healing lateral ulceration. He is pleased with the improvement of this ulcer. He is 5 months post op.    Review of Systems  Constitutional: Negative for chills and fever.       Objective: Vital Signs: There were no vitals taken for this visit.  Physical Exam: Patient is alert and oriented. No adenopathy. Well-dressed. Normal affect. Respirations easy. Does have an antalgic gait. To the left transmetatarsal amputation lateral aspect there is a 15 mm in diameter calloused ulceration. There is a central ulcer, this is 2 mm in diameter  and 2 mm of depth. No drainage, surrounding erythema or sign of infection. The left foot over the head of the fifth metatarsal there is a 3 cm in diameter ulceration with maceration around the edges. There is fibrinous exudative tissue in the wound bed, this probes down to the fascia. No drainage no surrounding erythema or ascending cellulitis.  Specialty Comments: No specialty comments available.   PMFS History: Patient Active Problem List   Diagnosis Date Noted  . Status post transmetatarsal amputation of foot, left (HCC) 11/24/2015  . Controlled diabetes mellitus with diabetic peripheral angiopathy and gangrene (HCC)   . 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 Transaminitis 01/07/2015  . Osteomyelitis of left foot (HCC) 01/06/2015  . Uncontrolled diabetes mellitus with foot ulcer, with long-term current use of insulin (HCC) 01/06/2015  . Anemia 01/06/2015  . Osteomyelitis (HCC) 01/05/2015   Past Medical History:  Diagnosis Date  . Diabetes mellitus without complication (HCC)    Type 1  . Heart murmur    "years ago"  . Hypertension    "years ago"  . 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, with long-term current use of insulin (HCC)     Family History  Problem Relation Age of Onset  . Hypertension Mother   . Diabetes Mother   .  Cancer Mother   . Prostate cancer Father    Past Surgical History:  Procedure Laterality Date  . AMPUTATION Left 01/07/2015   Procedure: Left Foot 1st Ray Amputation;  Surgeon: Nadara MustardMarcus Duda V, MD;  [facility];  Service: Orthopedics;  Laterality: Left;  . AMPUTATION Left 07/15/2015   Procedure: Left Transmetatarsal Amputation;  Surgeon: Nadara MustardMarcus V Duda, MD;  Location: [facility] OR;  Service: Orthopedics;  Laterality: Left;   Social History   Occupational History  . Not on file.   Social History Main Topics  . Smoking status: Never  Smoker  . Smokeless tobacco: Never Used  . Alcohol use No  . Drug use: No  . Sexual activity: Not on file

## 2015-12-27 ENCOUNTER — Ambulatory Visit (INDEPENDENT_AMBULATORY_CARE_PROVIDER_SITE_OTHER): Payer: BLUE CROSS/BLUE SHIELD | Admitting: Orthopedic Surgery

## 2015-12-27 ENCOUNTER — Encounter (INDEPENDENT_AMBULATORY_CARE_PROVIDER_SITE_OTHER): Payer: Self-pay | Admitting: Orthopedic Surgery

## 2015-12-27 DIAGNOSIS — L97514 Non-pressure chronic ulcer of other part of right foot with necrosis of bone: Secondary | ICD-10-CM | POA: Insufficient documentation

## 2015-12-27 DIAGNOSIS — E1052 Type 1 diabetes mellitus with diabetic peripheral angiopathy with gangrene: Secondary | ICD-10-CM | POA: Diagnosis not present

## 2015-12-27 NOTE — Progress Notes (Signed)
Office Visit Note   Patient: Gordon Walker           Date of Birth: [birth year]           MRN: [id number] Visit Date: 12/27/2015              Requested by: Darrow Bussingibas Koirala, MD [address] [place], [place] [address] PCP: Darrow BussingKOIRALA,DIBAS, MD   Assessment & Plan: Visit Diagnoses:  1. Ulcer of toe of right foot, with necrosis of bone (HCC)   2. 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, with long-term current use of insulin (HCC)     Plan: Patient has necrotic ulcer over the fifth fifth metatarsal head right foot with ischemic exposed bony changes. There is no ascending cellulitis he has undergone good conservative care with wound care nitroglycerin patches to improve microcirculation pressure unloading and oral antibiotics without relief. Plan for right foot fifth ray amputation. Risk and benefits were discussed including risk of the wound not healing. Patient states he understands wishes to proceed at this time plan for surgery on Friday.  Follow-Up Instructions: Return in about 2 weeks (around 01/10/2016).   Orders:  No orders of the defined types were placed in this encounter.  No orders of the defined types were placed in this encounter.     Procedures: No procedures performed   Clinical Data: No additional findings.   Subjective: Chief Complaint  Patient presents with  . Right Foot - Wound Check    Right foot ulcer    Patient has a lateral side right foot ulcer. He is using santyl dressing dressing changes daily and takes Bactrim DS BID and a nitro patch daily. The peri wound area is completely macerated and there is a slight odor to the wound. Nonviable tissue at the wound base and the pt is full weight bearing in a new balance shoe.    Wound Check     Review of Systems   Objective: Vital Signs: There were no vitals taken for this visit.  Physical Exam examination patient is alert oriented no adenopathy  well-dressed normal affect normal respiratory effort no fever or chills. Examination does have an antalgic gait. He does have a palpable dorsalis pedis and posterior tibial pulse. He has a necrotic ulcer over the fifth metatarsal head which probes down to bone. There is purulent drainage. Foul-smelling odor. No ascending cellulitis.  Ortho Exam  Specialty Comments:  No specialty comments available.  Imaging: No results found.   PMFS History: Patient Active Problem List   Diagnosis Date Noted  . Ulcer of toe of right foot, with necrosis of bone (HCC) 12/27/2015  . Status post transmetatarsal amputation of foot, left (HCC) 11/24/2015  . Controlled diabetes mellitus with diabetic peripheral angiopathy and gangrene (HCC)   . 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 Transaminitis 01/07/2015  . Osteomyelitis of left foot (HCC) 01/06/2015  . Uncontrolled diabetes mellitus with foot ulcer, with long-term current use of insulin (HCC) 01/06/2015  . Anemia 01/06/2015  . Osteomyelitis (HCC) 01/05/2015   Past Medical History:  Diagnosis Date  . Diabetes mellitus without complication (HCC)    Type 1  . Heart murmur    "years ago"  . Hypertension    "years ago"  . 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, with long-term current use of insulin (HCC)     Family History  Problem Relation Age of Onset  . Hypertension Mother   . Diabetes Mother   . Cancer Mother   . Prostate cancer Father     Past Surgical History:  Procedure Laterality Date  . AMPUTATION Left 01/07/2015   Procedure: Left Foot 1st Ray Amputation;  Surgeon: Nadara MustardMarcus Duda V, MD;  [facility];  Service: Orthopedics;  Laterality: Left;  . AMPUTATION Left 07/15/2015   Procedure: Left Transmetatarsal Amputation;  Surgeon: Nadara MustardMarcus V Duda, MD;  Location: [facility] OR;  Service: Orthopedics;  Laterality: Left;   Social  History   Occupational History  . Not on file.   Social History Main Topics  . Smoking status: Never Smoker  . Smokeless tobacco: Never Used  . Alcohol use No  . Drug use: No  . Sexual activity: Not on file

## 2015-12-28 ENCOUNTER — Encounter (HOSPITAL_COMMUNITY): Payer: Self-pay | Admitting: *Deleted

## 2015-12-28 ENCOUNTER — Ambulatory Visit (INDEPENDENT_AMBULATORY_CARE_PROVIDER_SITE_OTHER): Payer: BLUE CROSS/BLUE SHIELD | Admitting: Endocrinology

## 2015-12-28 ENCOUNTER — Encounter: Payer: Self-pay | Admitting: Endocrinology

## 2015-12-28 VITALS — BP 134/62 | HR 67 | Ht 72.0 in | Wt 186.0 lb

## 2015-12-28 DIAGNOSIS — L97509 Non-pressure chronic ulcer of other part of unspecified foot with unspecified severity: Secondary | ICD-10-CM

## 2015-12-28 DIAGNOSIS — Z794 Long term (current) use of insulin: Secondary | ICD-10-CM | POA: Diagnosis not present

## 2015-12-28 DIAGNOSIS — E0865 Diabetes mellitus due to underlying condition with hyperglycemia: Secondary | ICD-10-CM

## 2015-12-28 DIAGNOSIS — E08621 Diabetes mellitus due to underlying condition with foot ulcer: Secondary | ICD-10-CM | POA: Diagnosis not present

## 2015-12-28 DIAGNOSIS — IMO0002 Reserved for concepts with insufficient information to code with codable children: Secondary | ICD-10-CM

## 2015-12-28 LAB — POCT GLYCOSYLATED HEMOGLOBIN (HGB A1C): HEMOGLOBIN A1C: 8.2

## 2015-12-28 MED ORDER — INSULIN ASPART 100 UNIT/ML FLEXPEN: 9.0000 [IU] | PEN_INJECTOR | Freq: Three times a day (TID) | SUBCUTANEOUS | 11 refills | Status: DC

## 2015-12-28 MED ORDER — INSULIN GLARGINE 100 UNIT/ML ~~LOC~~ SOLN: 27.0000 [IU] | Freq: Every day | SUBCUTANEOUS | 11 refills | Status: DC

## 2015-12-28 NOTE — Progress Notes (Signed)
Spoke with pt for pre-op call. Pt denies cardiac history, chest pain or sob. Pt is diabetic. Had A1C done today and it was 8.2. Pt states his fasting blood sugar is anywhere between 38-90. Instructed pt to take 1/2 of his regular dose of Lantus Thursday PM (will take 13 units). Pt instructed not to take his Novalog or Humalog on Friday AM. Instructed pt to check his blood sugar when he gets up Friday AM and every 2 hours until he leaves for the hospital. If blood sugar is 70 or below, treat with 1/2 cup of clear juice (apple or cranberry) and recheck blood sugar 15 minutes after drinking juice. If blood sugar continues to be 70 or below, call the Short Stay department and ask to speak to a nurse. Pt voiced understanding.

## 2015-12-28 NOTE — Patient Instructions (Signed)
check your blood sugar twice a day.  vary the time of day when you check, between before the 3 meals, and at bedtime.  also check if you have symptoms of your blood sugar being too high or too low.  please keep a record of the readings and bring it to your next appointment here (or you can bring the meter itself).  You can write it on any piece of paper.  please call us sooner if your blood sugar goes below 70, or if you have a lot of readings over 200. For now, please: Reduce the lantus to 27 units at bedtime, and: Increase the novolog to 9 units 3 times a day (just before each meal).  You can take 1 extra unit for a high blood sugar (300), or for a larger than usual meal.   Please come back for a follow-up appointment in 2 months.

## 2015-12-28 NOTE — Progress Notes (Signed)
Subjective:    Patient ID: Gordon Walker, male    DOB: Jun 20, 1965, 50 y.o.   MRN: [id number]  HPI Pt returns for f/u of diabetes mellitus: DM type: Insulin-requiring type 2 Dx'ed: 1998 Complications: polyneuropathy, left foot amputation, nephropathy, and retinopathy. Therapy: insulin since dx DKA: never Severe hypoglycemia: last episode was Sept, 2017 Pancreatitis: never Other: he takes multiple daily injections Interval history:  no cbg record, but states cbg's vary from 38-200's.  It is lowest fasting.   Past Medical History:  Diagnosis Date  . Diabetes mellitus without complication (HCC)    Type 1  . Heart murmur    "years ago"  . Hypertension    "years ago"  . 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, with long-term current use of insulin [facility](HCC)     Past Surgical History:  Procedure Laterality Date  . AMPUTATION Left 01/07/2015   Procedure: Left Foot 1st Ray Amputation;  Surgeon: Nadara MustardMarcus Duda V, MD;  [facility];  Service: Orthopedics;  Laterality: Left;  . AMPUTATION Left 07/15/2015   Procedure: Left Transmetatarsal Amputation;  Surgeon: Nadara MustardMarcus V Duda, MD;  Location: [facility] OR;  Service: Orthopedics;  Laterality: Left;    Social History   Social History  . Marital status: Married    Spouse name: N/A  . Number of children: N/A  . Years of education: N/A   Occupational History  . Not on file.   Social History Main Topics  . Smoking status: Never Smoker  . Smokeless tobacco: Never Used  . Alcohol use No  . Drug use: No  . Sexual activity: Not on file   Other Topics Concern  . Not on file   Social History Narrative  . No narrative on file    Current Outpatient Prescriptions on File Prior to Visit  Medication Sig Dispense Refill  . atorvastatin (LIPITOR) 20 MG tablet Take 20 mg by mouth daily.    Marland Kitchen. glucagon 1 MG injection Inject 1 mg into the muscle once as needed. 1 each 12  . HUMALOG KWIKPEN 100 UNIT/ML KiwkPen  Inject 8 Units as directed 3 (three) times daily with meals.    Marland Kitchen. lisinopril (PRINIVIL,ZESTRIL) 10 MG tablet Take 10 mg by mouth daily.     . Multiple Vitamin (MULTIVITAMIN WITH MINERALS) TABS tablet Take 1 tablet by mouth daily.    . nitroGLYCERIN (NITRODUR - DOSED IN MG/24 HR) 0.2 mg/hr patch Place 0.2 mg onto the skin daily.     . Polyvinyl Alcohol (LUBRICANT DROPS OP) Apply 1 drop to eye daily as needed (dry eyes).    . silver sulfADIAZINE (SILVADENE) 1 % cream Apply 1 application topically daily.    Marland Kitchen. sulfamethoxazole-trimethoprim (BACTRIM DS,SEPTRA DS) 800-160 MG tablet Take 1 tablet by mouth 2 (two) times daily. 30 tablet 0   No current facility-administered medications on file prior to visit.     No Known Allergies  Family History  Problem Relation Age of Onset  . Hypertension Mother   . Diabetes Mother   . Cancer Mother   . Prostate cancer Father     BP 134/62   Pulse 67   Ht 6' (1.829 m)   Wt 186 lb (84.4 kg)   SpO2 98%   BMI 25.23 kg/m    Review of Systems Denies LOC    Objective:   Physical Exam VITAL SIGNS:  See vs page GENERAL: no distress Pulses: dorsalis pedis intact bilat.   MSK:  Left foot transmetatarsal amputation,  well-healed.  CV: trace bilat leg edema Skin: bandaged ulcer on the right foot (sees ortho).  normal color and temp on the feet. Neuro: sensation is intact to touch on the feet, but severely decreased from normal  A1c=8.2%    Assessment & Plan:  Insulin-requiring type 2 DM, with amputation, improved.  The pattern of his cbg's indicates he needs some adjustment in his therapy.   Patient is advised the following: Patient Instructions  check your blood sugar twice a day.  vary the time of day when you check, between before the 3 meals, and at bedtime.  also check if you have symptoms of your blood sugar being too high or too low.  please keep a record of the readings and bring it to your next appointment here (or you can bring the meter  itself).  You can write it on any piece of paper.  please call us sooner if your blood sugar goes below 70, or if you have a lot of readings over 200. For now, please: Reduce the lantus to 27 units at bedtime, and: Increase the novolog to 9 units 3 times a day (just before each meal).  You can take 1 extra unit for a high blood sugar (300), or for a larger than usual meal.   Please come back for a follow-up appointment in 2 months.

## 2015-12-29 ENCOUNTER — Other Ambulatory Visit (INDEPENDENT_AMBULATORY_CARE_PROVIDER_SITE_OTHER): Payer: Self-pay | Admitting: Family

## 2015-12-30 ENCOUNTER — Ambulatory Visit (HOSPITAL_COMMUNITY)
Admission: RE | Admit: 2015-12-30 | Discharge: 2015-12-30 | Disposition: A | Discharge status: Home / Self Care | Payer: BLUE CROSS/BLUE SHIELD | Source: Ambulatory Visit | Attending: Orthopedic Surgery | Admitting: Orthopedic Surgery

## 2015-12-30 ENCOUNTER — Encounter (HOSPITAL_COMMUNITY): Payer: Self-pay | Admitting: *Deleted

## 2015-12-30 ENCOUNTER — Encounter (HOSPITAL_COMMUNITY)
Admission: RE | Disposition: A | Discharge status: Home / Self Care | Payer: Self-pay | Source: Ambulatory Visit | Attending: Orthopedic Surgery

## 2015-12-30 ENCOUNTER — Ambulatory Visit (HOSPITAL_COMMUNITY): Payer: BLUE CROSS/BLUE SHIELD | Admitting: Anesthesiology

## 2015-12-30 DIAGNOSIS — E1151 Type 2 diabetes mellitus with diabetic peripheral angiopathy without gangrene: Secondary | ICD-10-CM | POA: Insufficient documentation

## 2015-12-30 DIAGNOSIS — I10 Essential (primary) hypertension: Secondary | ICD-10-CM | POA: Insufficient documentation

## 2015-12-30 DIAGNOSIS — L97514 Non-pressure chronic ulcer of other part of right foot with necrosis of bone: Secondary | ICD-10-CM | POA: Diagnosis not present

## 2015-12-30 DIAGNOSIS — M868X6 Other osteomyelitis, lower leg: Secondary | ICD-10-CM | POA: Diagnosis not present

## 2015-12-30 DIAGNOSIS — Z89432 Acquired absence of left foot: Secondary | ICD-10-CM | POA: Diagnosis not present

## 2015-12-30 DIAGNOSIS — Z794 Long term (current) use of insulin: Secondary | ICD-10-CM | POA: Diagnosis not present

## 2015-12-30 DIAGNOSIS — E1069 Type 1 diabetes mellitus with other specified complication: Secondary | ICD-10-CM | POA: Insufficient documentation

## 2015-12-30 HISTORY — PX: AMPUTATION: SHX166

## 2015-12-30 LAB — BASIC METABOLIC PANEL
ANION GAP: 10 (ref 5–15)
BUN: 21 mg/dL — ABNORMAL HIGH (ref 6–20)
CO2: 23 mmol/L (ref 22–32)
Calcium: 9.2 mg/dL (ref 8.9–10.3)
Chloride: 104 mmol/L (ref 101–111)
Creatinine, Ser: 1.36 mg/dL — ABNORMAL HIGH (ref 0.61–1.24)
Glucose, Bld: 229 mg/dL — ABNORMAL HIGH (ref 65–99)
POTASSIUM: 4.4 mmol/L (ref 3.5–5.1)
SODIUM: 137 mmol/L (ref 135–145)

## 2015-12-30 LAB — GLUCOSE, CAPILLARY
GLUCOSE-CAPILLARY: 214 mg/dL — AB (ref 65–99)
Glucose-Capillary: 257 mg/dL — ABNORMAL HIGH (ref 65–99)

## 2015-12-30 LAB — CBC
HEMATOCRIT: 34.7 % — AB (ref 39.0–52.0)
HEMOGLOBIN: 11.7 g/dL — AB (ref 13.0–17.0)
MCH: 28.1 pg (ref 26.0–34.0)
MCHC: 33.7 g/dL (ref 30.0–36.0)
MCV: 83.4 fL (ref 78.0–100.0)
Platelets: 351 10*3/uL (ref 150–400)
RBC: 4.16 MIL/uL — AB (ref 4.22–5.81)
RDW: 12.3 % (ref 11.5–15.5)
WBC: 6.8 10*3/uL (ref 4.0–10.5)

## 2015-12-30 SURGERY — AMPUTATION, FOOT, RAY
Anesthesia: General | Laterality: Right

## 2015-12-30 MED ORDER — PROPOFOL 10 MG/ML IV BOLUS
INTRAVENOUS | Status: AC
  Filled 2015-12-30: qty 20

## 2015-12-30 MED ORDER — MIDAZOLAM HCL 5 MG/5ML IJ SOLN
INTRAMUSCULAR | Status: DC | PRN
  Administered 2015-12-30: 2 mg via INTRAVENOUS

## 2015-12-30 MED ORDER — MEPERIDINE HCL 25 MG/ML IJ SOLN: 6.2500 mg | INTRAMUSCULAR | Status: DC | PRN

## 2015-12-30 MED ORDER — PROMETHAZINE HCL 25 MG/ML IJ SOLN: 6.2500 mg | INTRAMUSCULAR | Status: DC | PRN

## 2015-12-30 MED ORDER — CEFAZOLIN SODIUM-DEXTROSE 2-4 GM/100ML-% IV SOLN
INTRAVENOUS | Status: AC
  Filled 2015-12-30: qty 100

## 2015-12-30 MED ORDER — OXYCODONE-ACETAMINOPHEN 5-325 MG PO TABS: 1.0000 | ORAL_TABLET | ORAL | 0 refills | Status: DC | PRN

## 2015-12-30 MED ORDER — FENTANYL CITRATE (PF) 100 MCG/2ML IJ SOLN
INTRAMUSCULAR | Status: DC | PRN
  Administered 2015-12-30: 100 ug via INTRAVENOUS

## 2015-12-30 MED ORDER — 0.9 % SODIUM CHLORIDE (POUR BTL) OPTIME
TOPICAL | Status: DC | PRN
  Administered 2015-12-30: 1000 mL

## 2015-12-30 MED ORDER — FENTANYL CITRATE (PF) 100 MCG/2ML IJ SOLN
INTRAMUSCULAR | Status: AC
  Filled 2015-12-30: qty 2

## 2015-12-30 MED ORDER — MIDAZOLAM HCL 2 MG/2ML IJ SOLN
INTRAMUSCULAR | Status: AC
  Filled 2015-12-30: qty 2

## 2015-12-30 MED ORDER — LACTATED RINGERS IV SOLN: INTRAVENOUS | Status: DC

## 2015-12-30 MED ORDER — LIDOCAINE HCL (CARDIAC) 20 MG/ML IV SOLN
INTRAVENOUS | Status: DC | PRN
  Administered 2015-12-30: 50 mg via INTRATRACHEAL

## 2015-12-30 MED ORDER — LACTATED RINGERS IV SOLN
INTRAVENOUS | Status: DC
  Administered 2015-12-30: 13:00:00 via INTRAVENOUS

## 2015-12-30 MED ORDER — PROPOFOL 10 MG/ML IV BOLUS
INTRAVENOUS | Status: DC | PRN
  Administered 2015-12-30: 200 mg via INTRAVENOUS

## 2015-12-30 MED ORDER — FENTANYL CITRATE (PF) 100 MCG/2ML IJ SOLN: 25.0000 ug | INTRAMUSCULAR | Status: DC | PRN

## 2015-12-30 MED ORDER — ONDANSETRON HCL 4 MG/2ML IJ SOLN
INTRAMUSCULAR | Status: DC | PRN
  Administered 2015-12-30: 4 mg via INTRAVENOUS

## 2015-12-30 MED ORDER — CEFAZOLIN SODIUM-DEXTROSE 2-4 GM/100ML-% IV SOLN
2.0000 g | INTRAVENOUS | Status: AC
  Administered 2015-12-30: 2 g via INTRAVENOUS
  Filled 2015-12-30: qty 100

## 2015-12-30 MED ORDER — CHLORHEXIDINE GLUCONATE 4 % EX LIQD: 60.0000 mL | Freq: Once | CUTANEOUS | Status: DC

## 2015-12-30 SURGICAL SUPPLY — 34 items
BLADE SAW SGTL MED 73X18.5 STR (BLADE) IMPLANT
BLADE SURG 21 STRL SS (BLADE) IMPLANT
BNDG COHESIVE 4X5 TAN STRL (GAUZE/BANDAGES/DRESSINGS) ×2 IMPLANT
BNDG GAUZE ELAST 4 BULKY (GAUZE/BANDAGES/DRESSINGS) ×2 IMPLANT
COVER SURGICAL LIGHT HANDLE (MISCELLANEOUS) ×2 IMPLANT
DRAPE U-SHAPE 47X51 STRL (DRAPES) ×2 IMPLANT
DRSG ADAPTIC 3X8 NADH LF (GAUZE/BANDAGES/DRESSINGS) ×2 IMPLANT
DRSG PAD ABDOMINAL 8X10 ST (GAUZE/BANDAGES/DRESSINGS) ×2 IMPLANT
DURAPREP 26ML APPLICATOR (WOUND CARE) ×2 IMPLANT
ELECT REM PT RETURN 9FT ADLT (ELECTROSURGICAL) ×2
ELECTRODE REM PT RTRN 9FT ADLT (ELECTROSURGICAL) ×1 IMPLANT
GAUZE SPONGE 4X4 12PLY STRL (GAUZE/BANDAGES/DRESSINGS) ×2 IMPLANT
GLOVE BIOGEL PI IND STRL 7.5 (GLOVE) ×1 IMPLANT
GLOVE BIOGEL PI IND STRL 9 (GLOVE) ×1 IMPLANT
GLOVE BIOGEL PI INDICATOR 7.5 (GLOVE) ×1
GLOVE BIOGEL PI INDICATOR 9 (GLOVE) ×1
GLOVE ECLIPSE 7.0 STRL STRAW (GLOVE) ×2 IMPLANT
GLOVE SURG ORTHO 9.0 STRL STRW (GLOVE) ×4 IMPLANT
GOWN STRL REUS W/ TWL LRG LVL3 (GOWN DISPOSABLE) ×1 IMPLANT
GOWN STRL REUS W/ TWL XL LVL3 (GOWN DISPOSABLE) ×2 IMPLANT
GOWN STRL REUS W/TWL LRG LVL3 (GOWN DISPOSABLE) ×1
GOWN STRL REUS W/TWL XL LVL3 (GOWN DISPOSABLE) ×2
KIT BASIN OR (CUSTOM PROCEDURE TRAY) ×2 IMPLANT
KIT ROOM TURNOVER OR (KITS) ×2 IMPLANT
NS IRRIG 1000ML POUR BTL (IV SOLUTION) ×2 IMPLANT
PACK ORTHO EXTREMITY (CUSTOM PROCEDURE TRAY) ×2 IMPLANT
PAD ARMBOARD 7.5X6 YLW CONV (MISCELLANEOUS) ×4 IMPLANT
SPONGE LAP 18X18 X RAY DECT (DISPOSABLE) ×2 IMPLANT
STOCKINETTE IMPERVIOUS LG (DRAPES) IMPLANT
SUT ETHILON 2 0 PSLX (SUTURE) ×2 IMPLANT
TOWEL OR 17X24 6PK STRL BLUE (TOWEL DISPOSABLE) ×2 IMPLANT
TOWEL OR 17X26 10 PK STRL BLUE (TOWEL DISPOSABLE) IMPLANT
UNDERPAD 30X30 (UNDERPADS AND DIAPERS) ×2 IMPLANT
WATER STERILE IRR 1000ML POUR (IV SOLUTION) IMPLANT

## 2015-12-30 NOTE — Progress Notes (Signed)
Orthopedic Tech Progress Note Patient Details:  Gordon Walker 02/11/1965 [id number]  Ortho Devices Type of Ortho Device: Postop shoe/boot Ortho Device/Splint Location: Provided Post op shoe for pt Right Foot.  Placed shoe at bedside with nurse Byrd HesselbachMaria.  ([name] Nurse Byrd HesselbachMaria Present.   [name], [name] 12/30/2015, 2:26 PM

## 2015-12-30 NOTE — Op Note (Signed)
12/30/2015  1:27 PM  PATIENT:  Gordon Walker    PRE-OPERATIVE DIAGNOSIS:  Osteomyelitis Right 5th Toe  POST-OPERATIVE DIAGNOSIS:  Same  PROCEDURE:  5th Ray Amputation Right Foot Local tissue rearrangement for wound closure 3 x 7 cm  SURGEON:  Nadara MustardMarcus V Poetry Cerro, MD  PHYSICIAN ASSISTANT:None ANESTHESIA:   General  PREOPERATIVE INDICATIONS:  Gordon Walker is a  50 y.o. male with a diagnosis of Osteomyelitis Right 5th Toe who failed conservative measures and elected for surgical management.    The risks benefits and alternatives were discussed with the patient preoperatively including but not limited to the risks of infection, bleeding, nerve injury, cardiopulmonary complications, the need for revision surgery, among others, and the patient was willing to proceed.  OPERATIVE IMPLANTS: None  OPERATIVE FINDINGS: Good petechial bleeding no deep abscess  OPERATIVE PROCEDURE: Patient brought the operating room and underwent a general anesthetic. After adequate levels anesthesia obtained patient's right lower extremity was prepped using DuraPrep draped into a sterile field a timeout was called. A racquet incision was made to encompass the toe ulcerative tissue this was carried sharply down to healthy viable tissue and the fifth ray along with the necrotic tissue was resected in 1 block of tissue through the base of the fifth metatarsal. Electrocautery was used for hemostasis. The wound was irrigated with normal saline there was good petechial bleeding. Local tissue rearrangement was used to close the wound 3 x 7 cm. Incision was closed using 2-0 nylon. A sterile compressive dressing was applied. Patient was taken the PACU in stable condition.

## 2015-12-30 NOTE — Progress Notes (Signed)
Arrived to PACU at 1338, patient's 02 sat at 70's, 02 up to 15 liters via FM but end up being bagged by CRNA. Dr. Eduard ClosW. Fitzgerald at bedside, 02 sats up to 100 after few minutes.

## 2015-12-30 NOTE — H&P (Signed)
Gordon Walker is an 50 y.o. male.   Chief Complaint: Osteomyelitis ulceration right foot fifth metatarsal head HPI: Patient is a 50 year old gentleman with type 1 diabetes who has failed conservative wound care and presents at this time for right foot fifth ray amputation.  Past Medical History:  Diagnosis Date  . Diabetes mellitus without complication (HCC)    Type 1  . Heart murmur    "years ago"  . Hypertension    "years ago"  . 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, with long-term current use of insulin [facility](HCC)     Past Surgical History:  Procedure Laterality Date  . AMPUTATION Left 01/07/2015   Procedure: Left Foot 1st Ray Amputation;  Surgeon: Nadara MustardMarcus Duda V, MD;  [facility];  Service: Orthopedics;  Laterality: Left;  . AMPUTATION Left 07/15/2015   Procedure: Left Transmetatarsal Amputation;  Surgeon: Nadara MustardMarcus V Duda, MD;  Location: [facility] OR;  Service: Orthopedics;  Laterality: Left;    Family History  Problem Relation Age of Onset  . Hypertension Mother   . Diabetes Mother   . Cancer Mother   . Prostate cancer Father    Social History:  reports that he has never smoked. He has never used smokeless tobacco. He reports that he does not drink alcohol or use drugs.  Allergies: No Known Allergies  Medications Prior to Admission  Medication Sig Dispense Refill  . atorvastatin (LIPITOR) 20 MG tablet Take 20 mg by mouth daily.    Marland Kitchen. glucagon 1 MG injection Inject 1 mg into the muscle once as needed. 1 each 12  . HUMALOG KWIKPEN 100 UNIT/ML KiwkPen Inject 8 Units as directed 3 (three) times daily with meals.    Marland Kitchen. lisinopril (PRINIVIL,ZESTRIL) 10 MG tablet Take 10 mg by mouth daily.     . Multiple Vitamin (MULTIVITAMIN WITH MINERALS) TABS tablet Take 1 tablet by mouth daily.    . nitroGLYCERIN (NITRODUR - DOSED IN MG/24 HR) 0.2 mg/hr patch Place 0.2 mg onto the skin daily.     . Polyvinyl Alcohol (LUBRICANT DROPS OP) Apply 1 drop to eye daily  as needed (dry eyes).    . silver sulfADIAZINE (SILVADENE) 1 % cream Apply 1 application topically daily.    Marland Kitchen. sulfamethoxazole-trimethoprim (BACTRIM DS,SEPTRA DS) 800-160 MG tablet Take 1 tablet by mouth 2 (two) times daily. 30 tablet 0  . insulin aspart (NOVOLOG FLEXPEN) 100 UNIT/ML FlexPen Inject 9 Units into the skin 3 (three) times daily with meals. And pen needles 3/day 15 mL 11  . insulin glargine (LANTUS) 100 UNIT/ML injection Inject 0.27 mLs (27 Units total) into the skin at bedtime. Sliding scale 10 mL 11    No results found for this or any previous visit (from the past 48 hour(s)). No results found.  Review of Systems  All other systems reviewed and are negative.   There were no vitals taken for this visit. Physical Exam  Examination patient is alert oriented no adenopathy well-dressed normal affect normal respiratory effort he has a palpable pulse he has ulceration with osteomyelitis of the fifth metatarsal head right foot. 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  Plan: We will plan for fifth ray amputation. Risk and benefits were discussed including risk of the wound not healing need for additional surgery. Patient states he understands and wishes to proceed at this time.  Nadara MustardMarcus V Duda, MD 12/30/2015, 11:50 AM

## 2015-12-30 NOTE — Anesthesia Procedure Notes (Signed)
Procedure Name: LMA Insertion Date/Time: 12/30/2015 1:12 PM Performed by: Marena ChancyBECKNER, Mcgregor Tinnon S Pre-anesthesia Checklist: Patient identified, Emergency Drugs available, Suction available and Patient being monitored Patient Re-evaluated:Patient Re-evaluated prior to inductionOxygen Delivery Method: Circle System Utilized Preoxygenation: Pre-oxygenation with 100% oxygen Intubation Type: IV induction Ventilation: Mask ventilation without difficulty LMA: LMA inserted LMA Size: 4.0 Number of attempts: 1 Airway Equipment and Method: Bite block Placement Confirmation: positive ETCO2 Tube secured with: Tape Dental Injury: Teeth and Oropharynx as per pre-operative assessment

## 2015-12-30 NOTE — Transfer of Care (Signed)
Immediate Anesthesia Transfer of Care Note  Patient: Gordon Walker  Procedure(s) Performed: Procedure(s): 5th Ray Amputation Right Foot (Right)  Patient Location: PACU  Anesthesia Type:General  Level of Consciousness: awake, alert  and oriented  Airway & Oxygen Therapy: Patient Spontanous Breathing and Patient connected to nasal cannula oxygen  Post-op Assessment: Report given to RN, Post -op Vital signs reviewed and stable and Patient moving all extremities X 4  Post vital signs: Reviewed and stable  Last Vitals:  Vitals:   12/30/15 1159 12/30/15 1345  BP: (!) 170/88   Pulse: 88 87  Resp: 18 (!) 25  Temp: 37.1 C     Last Pain:  Vitals:   12/30/15 1215  TempSrc:   PainSc: 8       Patients Stated Pain Goal: 2 (12/30/15 1215)  Complications: No apparent anesthesia complications

## 2015-12-30 NOTE — Anesthesia Preprocedure Evaluation (Addendum)
Anesthesia Evaluation  Patient identified by MRN, date of birth, ID band Patient awake    Reviewed: Allergy & Precautions, NPO status , Patient's Chart, lab work & pertinent test results  Airway Mallampati: II  TM Distance: >3 FB Neck ROM: Full    Dental  (+) Teeth Intact, Dental Advisory Given   Pulmonary neg pulmonary ROS,    breath sounds clear to auscultation       Cardiovascular hypertension, Pt. on medications + Peripheral Vascular Disease   Rhythm:Regular Rate:Normal     Neuro/Psych negative neurological ROS  negative psych ROS   GI/Hepatic negative GI ROS, Neg liver ROS,   Endo/Other  diabetes, Type 1, Insulin Dependent  Renal/GU negative Renal ROS  negative genitourinary   Musculoskeletal negative musculoskeletal ROS (+)   Abdominal   Peds negative pediatric ROS (+)  Hematology negative hematology ROS (+)   Anesthesia Other Findings   Reproductive/Obstetrics negative OB ROS                            Lab Results  Component Value Date   WBC 6.1 07/15/2015   HGB 11.8 (L) 07/15/2015   HCT 35.9 (L) 07/15/2015   MCV 83.7 07/15/2015   PLT 386 07/15/2015   Lab Results  Component Value Date   CREATININE 1.03 07/15/2015   BUN 10 07/15/2015   NA 136 07/15/2015   K 4.6 07/15/2015   CL 102 07/15/2015   CO2 27 07/15/2015   No results found for: INR, PROTIME  Anesthesia Physical Anesthesia Plan  ASA: II  Anesthesia Plan: General   Post-op Pain Management:    Induction: Intravenous  Airway Management Planned: LMA  Additional Equipment:   Intra-op Plan:   Post-operative Plan: Extubation in OR  Informed Consent: I have reviewed the patients History and Physical, chart, labs and discussed the procedure including the risks, benefits and alternatives for the proposed anesthesia with the patient or authorized representative who has indicated his/her understanding and  acceptance.   Dental advisory given  Plan Discussed with: CRNA  Anesthesia Plan Comments:         Anesthesia Quick Evaluation

## 2016-01-01 NOTE — Anesthesia Postprocedure Evaluation (Signed)
Anesthesia Post Note  Patient: Gordon Walker  Procedure(s) Performed: Procedure(s) (LRB): 5th Ray Amputation Right Foot (Right)  Patient location during evaluation: PACU Anesthesia Type: General Level of consciousness: awake and alert Pain management: pain level controlled Vital Signs Assessment: post-procedure vital signs reviewed and stable Respiratory status: spontaneous breathing, nonlabored ventilation, respiratory function stable and patient connected to nasal cannula oxygen Cardiovascular status: blood pressure returned to baseline and stable Postop Assessment: no signs of nausea or vomiting Anesthetic complications: no    Last Vitals:  Vitals:   12/30/15 1445 12/30/15 1500  BP: (!) 153/94 (!) 158/88  Pulse: 79 80  Resp: 12 10  Temp:  37.1 C    Last Pain:  Vitals:   12/30/15 1445  TempSrc:   PainSc: 0-No pain                 Shelton SilvasKevin D Elyan Vanwieren

## 2016-01-02 ENCOUNTER — Encounter (HOSPITAL_COMMUNITY): Payer: Self-pay | Admitting: Orthopedic Surgery

## 2016-01-05 ENCOUNTER — Encounter (INDEPENDENT_AMBULATORY_CARE_PROVIDER_SITE_OTHER): Payer: Self-pay | Admitting: Orthopedic Surgery

## 2016-01-05 ENCOUNTER — Ambulatory Visit (INDEPENDENT_AMBULATORY_CARE_PROVIDER_SITE_OTHER): Payer: BLUE CROSS/BLUE SHIELD | Admitting: Orthopedic Surgery

## 2016-01-05 DIAGNOSIS — IMO0002 Reserved for concepts with insufficient information to code with codable children: Secondary | ICD-10-CM | POA: Insufficient documentation

## 2016-01-05 DIAGNOSIS — Z89431 Acquired absence of right foot: Secondary | ICD-10-CM

## 2016-01-05 NOTE — Progress Notes (Signed)
Office Visit Note   Patient: Gordon Walker           Date of Birth: 1965/09/11           MRN: [id number] Visit Date: 01/05/2016              Requested by: Darrow Bussingibas Koirala, MD [address] [place], [place] [address] PCP: Darrow BussingKOIRALA,DIBAS, MD   Assessment & Plan: Visit Diagnoses:  1. Foot amputation status, right (HCC)     Plan: Cleanse incision daily. May begin showering in getting incision wet. Will not submerge. Dry dressings daily. Have recommended he begin using crutches to nonweight-bear.  Follow-Up Instructions: Return in about 2 weeks (around 01/19/2016).   Orders:  No orders of the defined types were placed in this encounter.  No orders of the defined types were placed in this encounter.     Procedures: No procedures performed   Clinical Data: No additional findings.   Subjective: Chief Complaint  Patient presents with  . Right Foot - Routine Post Op    5th Ray Amputation Right Foot    Patient is here for post operative evaluation of 5th Ray Amputation Right Foot 12/30/15. Patient is 6 days post op. There is bleeding mid incision, his does have minimal pain. Patient is weightbearing in post operative shoe wear. Sutures are intact.     Review of Systems  Constitutional: Negative for chills and fever.     Objective: Vital Signs: There were no vitals taken for this visit.  Physical Exam  Constitutional: He is oriented to person, place, and time. He appears well-developed and well-nourished.  Pulmonary/Chest: Effort normal.  Musculoskeletal:  Right foot fifth ray amputation is approximated with sutures. There is a little bit of gaping along the center of the incision and a length of 15 mm. This does not probe. No drainage no surrounding erythema and no odor.  Neurological: He is alert and oriented to person, place, and time.  Psychiatric: He has a normal mood and affect.  Nursing note reviewed.   Ortho Exam  Specialty Comments:    No specialty comments available.  Imaging: No results found.   PMFS History: Patient Active Problem List   Diagnosis Date Noted  . Foot amputation status, right (HCC) 01/05/2016  . Ulcer of toe of right foot, with necrosis of bone (HCC) 12/27/2015  . Status post transmetatarsal amputation of foot, left (HCC) 11/24/2015  . Controlled diabetes mellitus with diabetic peripheral angiopathy and gangrene (HCC)   . 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 Transaminitis 01/07/2015  . Osteomyelitis of left foot (HCC) 01/06/2015  . Uncontrolled diabetes mellitus with foot ulcer, with long-term current use of insulin (HCC) 01/06/2015  . Anemia 01/06/2015  . Osteomyelitis (HCC) 01/05/2015   Past Medical History:  Diagnosis Date  . Diabetes mellitus without complication (HCC)    Type 1  . Heart murmur    "years ago"  . Hypertension    "years ago"  . 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, with long-term current use of insulin (HCC)     Family History  Problem Relation Age of Onset  . Hypertension Mother   . Diabetes Mother   . Cancer Mother   . Prostate cancer Father     Past Surgical History:  Procedure Laterality Date  . AMPUTATION Left 01/07/2015   Procedure: Left Foot 1st Ray Amputation;  Surgeon:  Nadara Mustard, MD;  Location[facility];  Service: Orthopedics;  Laterality: Left;  . AMPUTATION Left 07/15/2015   Procedure: Left Transmetatarsal Amputation;  Surgeon: Nadara Mustard, MD;  Location: [facility] OR;  Service: Orthopedics;  Laterality: Left;  . AMPUTATION Right 12/30/2015   Procedure: 5th Ray Amputation Right Foot;  Surgeon: Nadara Mustard, MD;  [facility];  Service: Orthopedics;  Laterality: Right;   Social History   Occupational History  . Not on file.   Social History Main Topics  . Smoking status: Never Smoker  . Smokeless tobacco: Never Used  . Alcohol  use No  . Drug use: No  . Sexual activity: Not on file

## 2016-01-06 ENCOUNTER — Ambulatory Visit (INDEPENDENT_AMBULATORY_CARE_PROVIDER_SITE_OTHER): Payer: BLUE CROSS/BLUE SHIELD | Admitting: Family

## 2016-01-14 ENCOUNTER — Inpatient Hospital Stay (HOSPITAL_COMMUNITY)
Admission: EM | Admit: 2016-01-14 | Discharge: 2016-01-20 | DRG: 856 | Disposition: A | Discharge status: Rehabilitation Facility | Payer: BLUE CROSS/BLUE SHIELD | Attending: Internal Medicine | Admitting: Internal Medicine

## 2016-01-14 ENCOUNTER — Emergency Department (HOSPITAL_COMMUNITY): Payer: BLUE CROSS/BLUE SHIELD

## 2016-01-14 DIAGNOSIS — K7200 Acute and subacute hepatic failure without coma: Secondary | ICD-10-CM | POA: Diagnosis not present

## 2016-01-14 DIAGNOSIS — T814XXA Infection following a procedure, initial encounter: Principal | ICD-10-CM | POA: Diagnosis present

## 2016-01-14 DIAGNOSIS — E875 Hyperkalemia: Secondary | ICD-10-CM | POA: Diagnosis not present

## 2016-01-14 DIAGNOSIS — Z79899 Other long term (current) drug therapy: Secondary | ICD-10-CM

## 2016-01-14 DIAGNOSIS — E1010 Type 1 diabetes mellitus with ketoacidosis without coma: Secondary | ICD-10-CM | POA: Diagnosis present

## 2016-01-14 DIAGNOSIS — D62 Acute posthemorrhagic anemia: Secondary | ICD-10-CM | POA: Diagnosis not present

## 2016-01-14 DIAGNOSIS — D509 Iron deficiency anemia, unspecified: Secondary | ICD-10-CM | POA: Diagnosis present

## 2016-01-14 DIAGNOSIS — T40601A Poisoning by unspecified narcotics, accidental (unintentional), initial encounter: Secondary | ICD-10-CM | POA: Diagnosis present

## 2016-01-14 DIAGNOSIS — E876 Hypokalemia: Secondary | ICD-10-CM | POA: Diagnosis present

## 2016-01-14 DIAGNOSIS — IMO0001 Reserved for inherently not codable concepts without codable children: Secondary | ICD-10-CM

## 2016-01-14 DIAGNOSIS — A419 Sepsis, unspecified organism: Secondary | ICD-10-CM | POA: Diagnosis present

## 2016-01-14 DIAGNOSIS — G9341 Metabolic encephalopathy: Secondary | ICD-10-CM | POA: Diagnosis present

## 2016-01-14 DIAGNOSIS — R011 Cardiac murmur, unspecified: Secondary | ICD-10-CM | POA: Diagnosis present

## 2016-01-14 DIAGNOSIS — A480 Gas gangrene: Secondary | ICD-10-CM | POA: Diagnosis present

## 2016-01-14 DIAGNOSIS — E871 Hypo-osmolality and hyponatremia: Secondary | ICD-10-CM | POA: Diagnosis present

## 2016-01-14 DIAGNOSIS — E860 Dehydration: Secondary | ICD-10-CM | POA: Diagnosis present

## 2016-01-14 DIAGNOSIS — M869 Osteomyelitis, unspecified: Secondary | ICD-10-CM

## 2016-01-14 DIAGNOSIS — A4151 Sepsis due to Escherichia coli [E. coli]: Secondary | ICD-10-CM | POA: Diagnosis present

## 2016-01-14 DIAGNOSIS — L089 Local infection of the skin and subcutaneous tissue, unspecified: Secondary | ICD-10-CM

## 2016-01-14 DIAGNOSIS — Z794 Long term (current) use of insulin: Secondary | ICD-10-CM

## 2016-01-14 DIAGNOSIS — I10 Essential (primary) hypertension: Secondary | ICD-10-CM | POA: Diagnosis present

## 2016-01-14 DIAGNOSIS — E1042 Type 1 diabetes mellitus with diabetic polyneuropathy: Secondary | ICD-10-CM | POA: Diagnosis present

## 2016-01-14 DIAGNOSIS — R7881 Bacteremia: Secondary | ICD-10-CM | POA: Diagnosis present

## 2016-01-14 DIAGNOSIS — Z7982 Long term (current) use of aspirin: Secondary | ICD-10-CM

## 2016-01-14 DIAGNOSIS — E1110 Type 2 diabetes mellitus with ketoacidosis without coma: Secondary | ICD-10-CM | POA: Diagnosis present

## 2016-01-14 DIAGNOSIS — R6521 Severe sepsis with septic shock: Secondary | ICD-10-CM | POA: Diagnosis present

## 2016-01-14 DIAGNOSIS — E1052 Type 1 diabetes mellitus with diabetic peripheral angiopathy with gangrene: Secondary | ICD-10-CM | POA: Diagnosis present

## 2016-01-14 DIAGNOSIS — K5900 Constipation, unspecified: Secondary | ICD-10-CM | POA: Diagnosis present

## 2016-01-14 DIAGNOSIS — N179 Acute kidney failure, unspecified: Secondary | ICD-10-CM | POA: Diagnosis present

## 2016-01-14 DIAGNOSIS — R109 Unspecified abdominal pain: Secondary | ICD-10-CM

## 2016-01-14 LAB — I-STAT VENOUS BLOOD GAS, ED
Acid-base deficit: 22 mmol/L — ABNORMAL HIGH (ref 0.0–2.0)
BICARBONATE: 5 mmol/L — AB (ref 20.0–28.0)
O2 Saturation: 63 %
PCO2 VEN: 15.4 mmHg — AB (ref 44.0–60.0)
TCO2: 5 mmol/L (ref 0–100)
pH, Ven: 7.12 — CL (ref 7.250–7.430)
pO2, Ven: 42 mmHg (ref 32.0–45.0)

## 2016-01-14 LAB — I-STAT CHEM 8, ED
BUN: 75 mg/dL — ABNORMAL HIGH (ref 6–20)
CHLORIDE: 82 mmol/L — AB (ref 101–111)
Calcium, Ion: 0.94 mmol/L — ABNORMAL LOW (ref 1.15–1.40)
Creatinine, Ser: 2.7 mg/dL — ABNORMAL HIGH (ref 0.61–1.24)
Glucose, Bld: >700 mg/dL (ref 65–99)
HEMATOCRIT: 33 % — AB (ref 39.0–52.0)
Hemoglobin: 11.2 g/dL — ABNORMAL LOW (ref 13.0–17.0)
Potassium: 7.6 mmol/L (ref 3.5–5.1)
Sodium: 113 mmol/L — CL (ref 135–145)
TCO2: 8 mmol/L (ref 0–100)

## 2016-01-14 LAB — CBC WITH DIFFERENTIAL/PLATELET
BASOS ABS: 0 10*3/uL (ref 0.0–0.1)
BASOS PCT: 0 %
Band Neutrophils: 24 %
Blasts: 0 %
EOS ABS: 0.4 10*3/uL (ref 0.0–0.7)
Eosinophils Relative: 1 %
HCT: 31.4 % — ABNORMAL LOW (ref 39.0–52.0)
Hemoglobin: 9.7 g/dL — ABNORMAL LOW (ref 13.0–17.0)
Lymphocytes Relative: 7 %
Lymphs Abs: 2.7 10*3/uL (ref 0.7–4.0)
MCH: 28 pg (ref 26.0–34.0)
MCHC: 30.9 g/dL (ref 30.0–36.0)
MCV: 90.8 fL (ref 78.0–100.0)
METAMYELOCYTES PCT: 0 %
MYELOCYTES: 0 %
Monocytes Absolute: 5 10*3/uL — ABNORMAL HIGH (ref 0.1–1.0)
Monocytes Relative: 13 %
NEUTROS PCT: 55 %
NRBC: 0 /100{WBCs}
Neutro Abs: 30.6 10*3/uL — ABNORMAL HIGH (ref 1.7–7.7)
PLATELETS: 408 10*3/uL — AB (ref 150–400)
PROMYELOCYTES ABS: 0 %
RBC: 3.46 MIL/uL — ABNORMAL LOW (ref 4.22–5.81)
RDW: 13.9 % (ref 11.5–15.5)
WBC MORPHOLOGY: INCREASED
WBC: 38.7 10*3/uL — ABNORMAL HIGH (ref 4.0–10.5)

## 2016-01-14 LAB — URINALYSIS, ROUTINE W REFLEX MICROSCOPIC
BILIRUBIN URINE: NEGATIVE
Glucose, UA: >=500 mg/dL — AB
KETONES UR: 20 mg/dL — AB
LEUKOCYTES UA: NEGATIVE
NITRITE: NEGATIVE
PH: 5 (ref 5.0–8.0)
Protein, ur: NEGATIVE mg/dL
SQUAMOUS EPITHELIAL / LPF: NONE SEEN
Specific Gravity, Urine: 1.016 (ref 1.005–1.030)

## 2016-01-14 LAB — I-STAT CG4 LACTIC ACID, ED: LACTIC ACID, VENOUS: 5.44 mmol/L — AB (ref 0.5–1.9)

## 2016-01-14 LAB — COMPREHENSIVE METABOLIC PANEL
ALBUMIN: 2 g/dL — AB (ref 3.5–5.0)
ALT: 67 U/L — AB (ref 17–63)
AST: 80 U/L — AB (ref 15–41)
Alkaline Phosphatase: 266 U/L — ABNORMAL HIGH (ref 38–126)
BUN: 83 mg/dL — ABNORMAL HIGH (ref 6–20)
CALCIUM: 8.1 mg/dL — AB (ref 8.9–10.3)
CHLORIDE: 75 mmol/L — AB (ref 101–111)
CREATININE: 3.52 mg/dL — AB (ref 0.61–1.24)
GFR calc Af Amer: 22 mL/min — ABNORMAL LOW (ref >60)
GFR calc non Af Amer: 19 mL/min — ABNORMAL LOW (ref >60)
GLUCOSE: 1003 mg/dL — AB (ref 65–99)
Potassium: >7.5 mmol/L (ref 3.5–5.1)
SODIUM: 115 mmol/L — AB (ref 135–145)
Total Bilirubin: 3 mg/dL — ABNORMAL HIGH (ref 0.3–1.2)
Total Protein: 6.8 g/dL (ref 6.5–8.1)

## 2016-01-14 LAB — CBG MONITORING, ED: Glucose-Capillary: >600 mg/dL (ref 65–99)

## 2016-01-14 MED ORDER — SODIUM CHLORIDE 0.9 % IV SOLN
1.0000 g | Freq: Once | INTRAVENOUS | Status: AC
  Administered 2016-01-14: 1 g via INTRAVENOUS
  Filled 2016-01-14: qty 10

## 2016-01-14 MED ORDER — SODIUM CHLORIDE 0.9 % IV BOLUS (SEPSIS)
1000.0000 mL | Freq: Once | INTRAVENOUS | Status: AC
  Administered 2016-01-14: 1000 mL via INTRAVENOUS

## 2016-01-14 MED ORDER — SODIUM CHLORIDE 0.9 % IV BOLUS (SEPSIS)
1000.0000 mL | Freq: Once | INTRAVENOUS | Status: AC
  Administered 2016-01-15: 1000 mL via INTRAVENOUS

## 2016-01-14 MED ORDER — SODIUM CHLORIDE 0.9 % IV SOLN
INTRAVENOUS | Status: DC
  Administered 2016-01-14: 5.4 [IU]/h via INTRAVENOUS
  Filled 2016-01-14 (×2): qty 2.5

## 2016-01-14 MED ORDER — SODIUM CHLORIDE 0.9 % IV SOLN
INTRAVENOUS | Status: DC
  Administered 2016-01-14 – 2016-01-15 (×2): via INTRAVENOUS

## 2016-01-14 MED ORDER — DEXTROSE-NACL 5-0.45 % IV SOLN: INTRAVENOUS | Status: DC

## 2016-01-14 MED ORDER — VANCOMYCIN HCL IN DEXTROSE 1-5 GM/200ML-% IV SOLN
1000.0000 mg | Freq: Once | INTRAVENOUS | Status: AC
  Administered 2016-01-14: 1000 mg via INTRAVENOUS
  Filled 2016-01-14: qty 200

## 2016-01-14 MED ORDER — PIPERACILLIN-TAZOBACTAM 3.375 G IVPB 30 MIN
3.3750 g | Freq: Once | INTRAVENOUS | Status: AC
  Administered 2016-01-14: 3.375 g via INTRAVENOUS
  Filled 2016-01-14: qty 50

## 2016-01-14 NOTE — ED Triage Notes (Signed)
Per [facility]  Pt wife called due to AMS and no LOC. CBG reading HIGH. R pinky toe capitulated on the 8th. Pt partial amputation to L foot. Pt respirations 40 and shallow   126/80 114 HR 40 R  Peaked T waves.  250 ml NS.   22 LAC.

## 2016-01-14 NOTE — ED Provider Notes (Signed)
[facility]-EMERGENCY DEPT Provider Note   CSN: [id number] Arrival date & time: 01/14/16  2132     History   Chief Complaint Chief Complaint  Patient presents with  . Hyperglycemia    HPI Gordon Walker is a 50 y.o. male.  HPI Remainder of history, ROS, and physical exam limited due to patient's condition (AMS). Additional information was obtained from either EMS or family.   Level V Caveat.  EMS was called out to the patient's home for altered mental status. CBG reading high. Patient recently had right lateral toe amputation.  Spoke with the wife shortly after patient arrival and reported that the patient has been having 3 days of nausea and vomiting with his diarrhea as well too. She denies any recent antibiotics in the last several weeks. Denies a recent fevers, cough, URI symptoms.   Past Medical History:  Diagnosis Date  . Diabetes mellitus without complication (HCC)    Type 1  . Heart murmur    "years ago"  . Hypertension    "years ago"  . 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, with long-term current use of insulin [facility](HCC)     Patient Active Problem List   Diagnosis Date Noted  . DKA (diabetic ketoacidoses) (HCC) 01/15/2016  . Foot amputation status, right (HCC) 01/05/2016  . Ulcer of toe of right foot, with necrosis of bone (HCC) 12/27/2015  . Status post transmetatarsal amputation of foot, left (HCC) 11/24/2015  . Controlled diabetes mellitus with diabetic peripheral angiopathy and gangrene (HCC)   . 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 Transaminitis 01/07/2015  . Osteomyelitis of left foot (HCC) 01/06/2015  . Uncontrolled diabetes mellitus with foot ulcer, with long-term current use of insulin (HCC) 01/06/2015  . Anemia 01/06/2015  . Osteomyelitis (HCC) 01/05/2015    Past Surgical History:  Procedure Laterality Date  . AMPUTATION Left  01/07/2015   Procedure: Left Foot 1st Ray Amputation;  Surgeon: Nadara MustardMarcus Duda V, MD;  [facility];  Service: Orthopedics;  Laterality: Left;  . AMPUTATION Left 07/15/2015   Procedure: Left Transmetatarsal Amputation;  Surgeon: Nadara MustardMarcus V Duda, MD;  Location: [facility] OR;  Service: Orthopedics;  Laterality: Left;  . AMPUTATION Right 12/30/2015   Procedure: 5th Ray Amputation Right Foot;  Surgeon: Nadara MustardMarcus V Duda, MD;  [facility];  Service: Orthopedics;  Laterality: Right;       Home Medications    Prior to Admission medications   Medication Sig Start Date End Date Taking? Authorizing Provider  aspirin EC 81 MG tablet Take 81 mg by mouth daily.   Yes Historical Provider, MD  atorvastatin (LIPITOR) 20 MG tablet Take 20 mg by mouth daily. 12/10/15  Yes Historical Provider, MD  glucagon 1 MG injection Inject 1 mg into the muscle once as needed. Patient taking differently: Inject 1 mg into the muscle daily as needed (hypoglycemia).  10/28/15  Yes Romero BellingSean Ellison, MD  HYDROcodone-acetaminophen (NORCO/VICODIN) 5-325 MG tablet Take 1 tablet by mouth every 6 (six) hours as needed for moderate pain.   Yes Historical Provider, MD  insulin aspart (NOVOLOG FLEXPEN) 100 UNIT/ML FlexPen Inject 9 Units into the skin 3 (three) times daily with meals. And pen needles 3/day 12/28/15  Yes Romero BellingSean Ellison, MD  insulin glargine (LANTUS) 100 UNIT/ML injection Inject 0.27 mLs (27 Units total) into the skin at bedtime. Sliding scale Patient taking differently: Inject 0-27 Units into the skin at bedtime.  Sliding scale 12/28/15  Yes Romero Belling, MD  lisinopril (PRINIVIL,ZESTRIL) 10 MG tablet Take 10 mg by mouth daily.  12/09/15  Yes Historical Provider, MD  Multiple Vitamin (MULTIVITAMIN WITH MINERALS) TABS tablet Take 1 tablet by mouth daily.   Yes Historical Provider, MD  Polyvinyl Alcohol (LUBRICANT DROPS OP) Apply 1 drop to eye daily as needed (dry eyes).   Yes Historical Provider, MD    Family History Family History    Problem Relation Age of Onset  . Hypertension Mother   . Diabetes Mother   . Cancer Mother   . Prostate cancer Father     Social History Social History  Substance Use Topics  . Smoking status: Never Smoker  . Smokeless tobacco: Never Used  . Alcohol use No     Allergies   Patient has no known allergies.   Review of Systems Review of Systems  Unable to perform ROS: Mental status change     Physical Exam Updated Vital Signs BP 128/60 (BP Location: Right Arm)   Temp 99.6 F (37.6 C) (Oral)   Resp (!) 32   Wt 186 lb (84.4 kg)   SpO2 100%   BMI 25.23 kg/m   Physical Exam  Constitutional: He appears well-developed and well-nourished. He appears lethargic. He appears toxic. Nasal cannula in place.  HENT:  Head: Normocephalic and atraumatic.  Nose: Nose normal.  Eyes: Conjunctivae and EOM are normal. Pupils are equal, round, and reactive to light. Right eye exhibits no discharge. Left eye exhibits no discharge. No scleral icterus.  Neck: Normal range of motion. Neck supple.  Cardiovascular: Regular rhythm.  Tachycardia present.  Exam reveals no gallop and no friction rub.   No murmur heard. Pulmonary/Chest: Breath sounds normal. No stridor. Tachypnea noted. He has no wheezes. He has no rhonchi. He has no rales.  Abdominal: Soft. He exhibits no distension. There is no tenderness.  Musculoskeletal: He exhibits no edema or tenderness.       Feet:  Neurological: He appears lethargic. He is disoriented. GCS eye subscore is 3. GCS verbal subscore is 4. GCS motor subscore is 6.  Skin: Skin is warm and dry. No rash noted. He is not diaphoretic.  Vitals reviewed.    ED Treatments / Results  Labs (all labs ordered are listed, but only abnormal results are displayed) Labs Reviewed  URINALYSIS, ROUTINE W REFLEX MICROSCOPIC - Abnormal; Notable for the following:       Result Value   APPearance HAZY (*)    Glucose, UA >=500 (*)    Hgb urine dipstick SMALL (*)    Ketones,  ur 20 (*)    Bacteria, UA RARE (*)    All other components within normal limits  CBC WITH DIFFERENTIAL/PLATELET - Abnormal; Notable for the following:    WBC 38.7 (*)    RBC 3.46 (*)    Hemoglobin 9.7 (*)    HCT 31.4 (*)    Platelets 408 (*)    Neutro Abs 30.6 (*)    Monocytes Absolute 5.0 (*)    All other components within normal limits  COMPREHENSIVE METABOLIC PANEL - Abnormal; Notable for the following:    Sodium 115 (*)    Potassium >7.5 (*)    Chloride 75 (*)    CO2 <7 (*)    Glucose, Bld 1,003 (*)    BUN 83 (*)    Creatinine, Ser 3.52 (*)    Calcium 8.1 (*)    Albumin 2.0 (*)    AST 80 (*)  ALT 67 (*)    Alkaline Phosphatase 266 (*)    Total Bilirubin 3.0 (*)    GFR calc non Af Amer 19 (*)    GFR calc Af Amer 22 (*)    All other components within normal limits  BLOOD GAS, ARTERIAL - Abnormal; Notable for the following:    pH, Arterial 7.130 (*)    pO2, Arterial 124 (*)    Bicarbonate 3.3 (*)    Acid-base deficit 24.9 (*)    All other components within normal limits  CBG MONITORING, ED - Abnormal; Notable for the following:    Glucose-Capillary >600 (*)    All other components within normal limits  I-STAT CHEM 8, ED - Abnormal; Notable for the following:    Sodium 113 (*)    Potassium 7.6 (*)    Chloride 82 (*)    BUN 75 (*)    Creatinine, Ser 2.70 (*)    Glucose, Bld >700 (*)    Calcium, Ion 0.94 (*)    Hemoglobin 11.2 (*)    HCT 33.0 (*)    All other components within normal limits  I-STAT VENOUS BLOOD GAS, ED - Abnormal; Notable for the following:    pH, Ven 7.120 (*)    pCO2, Ven 15.4 (*)    Bicarbonate 5.0 (*)    Acid-base deficit 22.0 (*)    All other components within normal limits  I-STAT CG4 LACTIC ACID, ED - Abnormal; Notable for the following:    Lactic Acid, Venous 5.44 (*)    All other components within normal limits  CBG MONITORING, ED - Abnormal; Notable for the following:    Glucose-Capillary >600 (*)    All other components within  normal limits  CBG MONITORING, ED - Abnormal; Notable for the following:    Glucose-Capillary >600 (*)    All other components within normal limits  CULTURE, BLOOD (ROUTINE X 2)  CULTURE, BLOOD (ROUTINE X 2)  URINE CULTURE  BETA-HYDROXYBUTYRIC ACID  I-STAT ARTERIAL BLOOD GAS, ED  I-STAT CG4 LACTIC ACID, ED  I-STAT CG4 LACTIC ACID, ED    EKG  EKG Interpretation  Date/Time:  Saturday January 14 2016 21:54:18 EST Ventricular Rate:  116 PR Interval:    QRS Duration: 215 QT Interval:  355 QTC Calculation: 494 R Axis:   118 Text Interpretation:  Sinus or ectopic atrial tachycardia Consider right atrial enlargement Nonspecific intraventricular conduction delay Anterolateral infarct, acute (LAD) Tall, peaked T waves suspicious for hyperkalemia When compared with ECG of 07/15/2015, T wave changes suggestive of hyperkalemia are now present Confirmed by [facility]  MD, DAVID ([id number]) on 01/15/2016 12:03:36 AM       Radiology Dg Chest Portable 1 View  Result Date: 01/14/2016 CLINICAL DATA:  Altered mental status.  Hyperglycemia. EXAM: PORTABLE CHEST 1 VIEW COMPARISON:  None. FINDINGS: A single AP portable view of the chest demonstrates no focal airspace consolidation or alveolar edema. The lungs are grossly clear. There is no large effusion or pneumothorax. Cardiac and mediastinal contours appear unremarkable. IMPRESSION: No active disease. Electronically Signed   By: Ellery Plunk M.D.   On: 01/14/2016 22:16    Procedures Procedures (including critical care time) Emergency Ultrasound Study:   Angiocath insertion Performed by: Nira Conn  Consent: emergent Immediately prior to procedure the correct patient, procedure, equipment, support staff and site/side marked as needed.  Indication: difficult IV access Preparation: Patient and probe were prepped with chlorhexidine or alcohol. Sterile gel used. Vein Location: left AC vein was visualized  during assessment for potential  access sites and was found to be patent/ easily compressed with linear ultrasound.  1.88 inch, 18 gauge needle was visualized with real-time ultrasound and guided into the vein.  Image saved and stored.  Normal blood return.  Patient tolerance: Patient tolerated the procedure well with no immediate complications.     Medications Ordered in ED Medications  sodium chloride 0.9 % bolus 1,000 mL (0 mLs Intravenous Stopped 01/14/16 2301)    And  0.9 %  sodium chloride infusion ( Intravenous Transfusing/Transfer 01/15/16 0101)  insulin regular (NOVOLIN R,HUMULIN R) 250 Units in sodium chloride 0.9 % 250 mL (1 Units/mL) infusion (10.8 Units/hr Intravenous Rate/Dose Change 01/15/16 0010)  dextrose 5 %-0.45 % sodium chloride infusion (not administered)  sodium chloride 0.9 % bolus 1,000 mL (0 mLs Intravenous Stopped 01/15/16 0101)    And  sodium chloride 0.9 % bolus 1,000 mL (0 mLs Intravenous Stopped 01/14/16 2327)    And  sodium chloride 0.9 % bolus 1,000 mL (not administered)  piperacillin-tazobactam (ZOSYN) IVPB 3.375 g (not administered)  vancomycin (VANCOCIN) 1,250 mg in sodium chloride 0.9 % 250 mL IVPB (not administered)  calcium gluconate 1 g in sodium chloride 0.9 % 100 mL IVPB (1 g Intravenous New Bag/Given 01/14/16 2356)  piperacillin-tazobactam (ZOSYN) IVPB 3.375 g (0 g Intravenous Stopped 01/14/16 2324)  vancomycin (VANCOCIN) IVPB 1000 mg/200 mL premix (1,000 mg Intravenous New Bag/Given 01/14/16 2254)     Initial Impression / Assessment and Plan / ED Course  I have reviewed the triage vital signs and the nursing notes.  Pertinent labs & imaging results that were available during my care of the patient were reviewed by me and considered in my medical decision making (see chart for details).  Clinical Course as of Jan 15 107  Sat Jan 14, 2016  2219 Workup consistent with DKA with hyperkalemia and patient meeting sepsis criteria with a lactic acid of 5.4. Patient needs a septic  shock. Empiric antibiotics initiated. 30 mL/kg of IV fluids initiated. Patient started on insulin drip. Also given calcium gluconate for potassium temporization and cardiac protection.  [PC]  2245 Full Code status after discussing situation and patient's status with wife.  [PC]  2324 Consulted Hospitalist who requested we discuss case with critical care  [PC]  2324 Critical care consulted  [PC]  2344 Critical care to admit.  [PC]  2344 Reassessment: GCS is unchanged; maintaining airway and sating well on minimal o2. Still no need for intubation at this time.  Family updated on plan. Questions answered.   [PC]    Clinical Course User Index [PC] Nira ConnPedro Eduardo Cardama, MD    CRITICAL CARE Performed by: Amadeo GarnetPedro Eduardo Cardama Total critical care time: 75 minutes Critical care time was exclusive of separately billable procedures and treating other patients. Critical care was necessary to treat or prevent imminent or life-threatening deterioration. Critical care was time spent personally by me on the following activities: development of treatment plan with patient and/or surrogate as well as nursing, discussions with consultants, evaluation of patient's response to treatment, examination of patient, obtaining history from patient or surrogate, ordering and performing treatments and interventions, ordering and review of laboratory studies, ordering and review of radiographic studies, pulse oximetry and re-evaluation of patient's condition.    Final Clinical Impressions(s) / ED Diagnoses   Final diagnoses:  Septic shock (HCC)  Diabetic ketoacidosis without coma associated with type 1 diabetes mellitus (HCC)  Hyperkalemia  AKI (acute kidney injury) (HCC)  Postoperative wound  infection, initial encounter      Nira Conn, MD 01/15/16 [phone]

## 2016-01-15 ENCOUNTER — Inpatient Hospital Stay (HOSPITAL_COMMUNITY): Payer: BLUE CROSS/BLUE SHIELD

## 2016-01-15 ENCOUNTER — Encounter (HOSPITAL_COMMUNITY)
Admission: EM | Disposition: A | Discharge status: Rehabilitation Facility | Payer: Self-pay | Source: Home / Self Care | Attending: Internal Medicine

## 2016-01-15 ENCOUNTER — Inpatient Hospital Stay (HOSPITAL_COMMUNITY): Payer: BLUE CROSS/BLUE SHIELD | Admitting: Anesthesiology

## 2016-01-15 ENCOUNTER — Encounter (HOSPITAL_COMMUNITY): Payer: Self-pay

## 2016-01-15 DIAGNOSIS — R6521 Severe sepsis with septic shock: Secondary | ICD-10-CM | POA: Diagnosis present

## 2016-01-15 DIAGNOSIS — A480 Gas gangrene: Secondary | ICD-10-CM | POA: Diagnosis present

## 2016-01-15 DIAGNOSIS — E0810 Diabetes mellitus due to underlying condition with ketoacidosis without coma: Secondary | ICD-10-CM | POA: Diagnosis not present

## 2016-01-15 DIAGNOSIS — E860 Dehydration: Secondary | ICD-10-CM | POA: Diagnosis present

## 2016-01-15 DIAGNOSIS — R7881 Bacteremia: Secondary | ICD-10-CM | POA: Diagnosis not present

## 2016-01-15 DIAGNOSIS — L089 Local infection of the skin and subcutaneous tissue, unspecified: Secondary | ICD-10-CM

## 2016-01-15 DIAGNOSIS — E1042 Type 1 diabetes mellitus with diabetic polyneuropathy: Secondary | ICD-10-CM | POA: Diagnosis present

## 2016-01-15 DIAGNOSIS — I10 Essential (primary) hypertension: Secondary | ICD-10-CM | POA: Diagnosis present

## 2016-01-15 DIAGNOSIS — N179 Acute kidney failure, unspecified: Secondary | ICD-10-CM | POA: Diagnosis present

## 2016-01-15 DIAGNOSIS — A4151 Sepsis due to Escherichia coli [E. coli]: Secondary | ICD-10-CM | POA: Diagnosis present

## 2016-01-15 DIAGNOSIS — Z794 Long term (current) use of insulin: Secondary | ICD-10-CM | POA: Diagnosis not present

## 2016-01-15 DIAGNOSIS — E876 Hypokalemia: Secondary | ICD-10-CM | POA: Diagnosis present

## 2016-01-15 DIAGNOSIS — E1010 Type 1 diabetes mellitus with ketoacidosis without coma: Secondary | ICD-10-CM | POA: Diagnosis present

## 2016-01-15 DIAGNOSIS — E1052 Type 1 diabetes mellitus with diabetic peripheral angiopathy with gangrene: Secondary | ICD-10-CM | POA: Diagnosis present

## 2016-01-15 DIAGNOSIS — E871 Hypo-osmolality and hyponatremia: Secondary | ICD-10-CM | POA: Diagnosis present

## 2016-01-15 DIAGNOSIS — G9340 Encephalopathy, unspecified: Secondary | ICD-10-CM | POA: Diagnosis not present

## 2016-01-15 DIAGNOSIS — T814XXA Infection following a procedure, initial encounter: Secondary | ICD-10-CM | POA: Diagnosis present

## 2016-01-15 DIAGNOSIS — K5900 Constipation, unspecified: Secondary | ICD-10-CM | POA: Diagnosis present

## 2016-01-15 DIAGNOSIS — D72829 Elevated white blood cell count, unspecified: Secondary | ICD-10-CM | POA: Diagnosis not present

## 2016-01-15 DIAGNOSIS — E875 Hyperkalemia: Secondary | ICD-10-CM | POA: Diagnosis present

## 2016-01-15 DIAGNOSIS — Z89511 Acquired absence of right leg below knee: Secondary | ICD-10-CM | POA: Diagnosis not present

## 2016-01-15 DIAGNOSIS — K7200 Acute and subacute hepatic failure without coma: Secondary | ICD-10-CM | POA: Diagnosis not present

## 2016-01-15 DIAGNOSIS — A419 Sepsis, unspecified organism: Secondary | ICD-10-CM

## 2016-01-15 DIAGNOSIS — E1110 Type 2 diabetes mellitus with ketoacidosis without coma: Secondary | ICD-10-CM | POA: Diagnosis present

## 2016-01-15 DIAGNOSIS — G9341 Metabolic encephalopathy: Secondary | ICD-10-CM | POA: Diagnosis present

## 2016-01-15 DIAGNOSIS — Z79899 Other long term (current) drug therapy: Secondary | ICD-10-CM | POA: Diagnosis not present

## 2016-01-15 DIAGNOSIS — D62 Acute posthemorrhagic anemia: Secondary | ICD-10-CM | POA: Diagnosis not present

## 2016-01-15 DIAGNOSIS — E0811 Diabetes mellitus due to underlying condition with ketoacidosis with coma: Secondary | ICD-10-CM | POA: Diagnosis not present

## 2016-01-15 DIAGNOSIS — R740 Nonspecific elevation of levels of transaminase and lactic acid dehydrogenase [LDH]: Secondary | ICD-10-CM | POA: Diagnosis not present

## 2016-01-15 HISTORY — PX: AMPUTATION: SHX166

## 2016-01-15 LAB — CBG MONITORING, ED: Glucose-Capillary: >600 mg/dL (ref 65–99)

## 2016-01-15 LAB — GLUCOSE, CAPILLARY
GLUCOSE-CAPILLARY: 561 mg/dL — AB (ref 65–99)
GLUCOSE-CAPILLARY: 497 mg/dL — AB (ref 65–99)
GLUCOSE-CAPILLARY: 432 mg/dL — AB (ref 65–99)
GLUCOSE-CAPILLARY: 300 mg/dL — AB (ref 65–99)
GLUCOSE-CAPILLARY: 183 mg/dL — AB (ref 65–99)
Glucose-Capillary: >600 mg/dL (ref 65–99)
Glucose-Capillary: >600 mg/dL (ref 65–99)
Glucose-Capillary: >600 mg/dL (ref 65–99)
Glucose-Capillary: 521 mg/dL (ref 65–99)
Glucose-Capillary: 445 mg/dL — ABNORMAL HIGH (ref 65–99)
Glucose-Capillary: 336 mg/dL — ABNORMAL HIGH (ref 65–99)
Glucose-Capillary: 343 mg/dL — ABNORMAL HIGH (ref 65–99)
Glucose-Capillary: 292 mg/dL — ABNORMAL HIGH (ref 65–99)
Glucose-Capillary: 256 mg/dL — ABNORMAL HIGH (ref 65–99)
Glucose-Capillary: 156 mg/dL — ABNORMAL HIGH (ref 65–99)
Glucose-Capillary: 141 mg/dL — ABNORMAL HIGH (ref 65–99)
Glucose-Capillary: 177 mg/dL — ABNORMAL HIGH (ref 65–99)
Glucose-Capillary: 166 mg/dL — ABNORMAL HIGH (ref 65–99)

## 2016-01-15 LAB — BASIC METABOLIC PANEL
ANION GAP: 27 — AB (ref 5–15)
ANION GAP: 5 (ref 5–15)
ANION GAP: 7 (ref 5–15)
Anion gap: 22 — ABNORMAL HIGH (ref 5–15)
Anion gap: 11 (ref 5–15)
BUN: 83 mg/dL — ABNORMAL HIGH (ref 6–20)
BUN: 83 mg/dL — AB (ref 6–20)
BUN: 74 mg/dL — AB (ref 6–20)
BUN: 70 mg/dL — AB (ref 6–20)
BUN: 51 mg/dL — ABNORMAL HIGH (ref 6–20)
CALCIUM: 7.4 mg/dL — AB (ref 8.9–10.3)
CALCIUM: 7.6 mg/dL — AB (ref 8.9–10.3)
CALCIUM: 7.7 mg/dL — AB (ref 8.9–10.3)
CALCIUM: 7.7 mg/dL — AB (ref 8.9–10.3)
CALCIUM: 7.4 mg/dL — AB (ref 8.9–10.3)
CO2: 7 mmol/L — ABNORMAL LOW (ref 22–32)
CO2: 11 mmol/L — AB (ref 22–32)
CO2: 20 mmol/L — AB (ref 22–32)
CO2: 24 mmol/L (ref 22–32)
CO2: 20 mmol/L — AB (ref 22–32)
CREATININE: 3.42 mg/dL — AB (ref 0.61–1.24)
CREATININE: 2.56 mg/dL — AB (ref 0.61–1.24)
CREATININE: 2.4 mg/dL — AB (ref 0.61–1.24)
Chloride: 87 mmol/L — ABNORMAL LOW (ref 101–111)
Chloride: 92 mmol/L — ABNORMAL LOW (ref 101–111)
Chloride: 99 mmol/L — ABNORMAL LOW (ref 101–111)
Chloride: 103 mmol/L (ref 101–111)
Chloride: 106 mmol/L (ref 101–111)
Creatinine, Ser: 3.79 mg/dL — ABNORMAL HIGH (ref 0.61–1.24)
Creatinine, Ser: 1.74 mg/dL — ABNORMAL HIGH (ref 0.61–1.24)
GFR calc Af Amer: 23 mL/min — ABNORMAL LOW (ref >60)
GFR calc Af Amer: 32 mL/min — ABNORMAL LOW (ref >60)
GFR calc Af Amer: 35 mL/min — ABNORMAL LOW (ref >60)
GFR calc Af Amer: 52 mL/min — ABNORMAL LOW (ref >60)
GFR calc non Af Amer: 28 mL/min — ABNORMAL LOW (ref >60)
GFR calc non Af Amer: 30 mL/min — ABNORMAL LOW (ref >60)
GFR calc non Af Amer: 45 mL/min — ABNORMAL LOW (ref >60)
GFR, EST AFRICAN AMERICAN: 20 mL/min — AB (ref >60)
GFR, EST NON AFRICAN AMERICAN: 17 mL/min — AB (ref >60)
GFR, EST NON AFRICAN AMERICAN: 20 mL/min — AB (ref >60)
GLUCOSE: 770 mg/dL — AB (ref 65–99)
GLUCOSE: 481 mg/dL — AB (ref 65–99)
GLUCOSE: 370 mg/dL — AB (ref 65–99)
GLUCOSE: 185 mg/dL — AB (ref 65–99)
Glucose, Bld: 862 mg/dL (ref 65–99)
POTASSIUM: 6.7 mmol/L — AB (ref 3.5–5.1)
Potassium: 6.3 mmol/L (ref 3.5–5.1)
Potassium: 5.1 mmol/L (ref 3.5–5.1)
Potassium: 4.8 mmol/L (ref 3.5–5.1)
Potassium: 4.1 mmol/L (ref 3.5–5.1)
SODIUM: 121 mmol/L — AB (ref 135–145)
Sodium: 125 mmol/L — ABNORMAL LOW (ref 135–145)
Sodium: 130 mmol/L — ABNORMAL LOW (ref 135–145)
Sodium: 132 mmol/L — ABNORMAL LOW (ref 135–145)
Sodium: 133 mmol/L — ABNORMAL LOW (ref 135–145)

## 2016-01-15 LAB — CBC
HCT: 24 % — ABNORMAL LOW (ref 39.0–52.0)
HEMATOCRIT: 28.7 % — AB (ref 39.0–52.0)
HEMATOCRIT: 26.2 % — AB (ref 39.0–52.0)
HEMOGLOBIN: 9.2 g/dL — AB (ref 13.0–17.0)
Hemoglobin: 9.7 g/dL — ABNORMAL LOW (ref 13.0–17.0)
Hemoglobin: 8.5 g/dL — ABNORMAL LOW (ref 13.0–17.0)
MCH: 27.6 pg (ref 26.0–34.0)
MCH: 27.6 pg (ref 26.0–34.0)
MCH: 27.7 pg (ref 26.0–34.0)
MCHC: 33.8 g/dL (ref 30.0–36.0)
MCHC: 35.1 g/dL (ref 30.0–36.0)
MCHC: 35.4 g/dL (ref 30.0–36.0)
MCV: 81.8 fL (ref 78.0–100.0)
MCV: 78.7 fL (ref 78.0–100.0)
MCV: 78.2 fL (ref 78.0–100.0)
PLATELETS: 285 10*3/uL (ref 150–400)
Platelets: 348 10*3/uL (ref 150–400)
Platelets: 331 10*3/uL (ref 150–400)
RBC: 3.51 MIL/uL — ABNORMAL LOW (ref 4.22–5.81)
RBC: 3.33 MIL/uL — ABNORMAL LOW (ref 4.22–5.81)
RBC: 3.07 MIL/uL — ABNORMAL LOW (ref 4.22–5.81)
RDW: 12.6 % (ref 11.5–15.5)
RDW: 12 % (ref 11.5–15.5)
RDW: 11.8 % (ref 11.5–15.5)
WBC: 28.4 10*3/uL — AB (ref 4.0–10.5)
WBC: 25.2 10*3/uL — ABNORMAL HIGH (ref 4.0–10.5)
WBC: 24.8 10*3/uL — AB (ref 4.0–10.5)

## 2016-01-15 LAB — BLOOD CULTURE ID PANEL (REFLEXED)
Acinetobacter baumannii: NOT DETECTED
CANDIDA KRUSEI: NOT DETECTED
CANDIDA TROPICALIS: NOT DETECTED
CARBAPENEM RESISTANCE: NOT DETECTED
Candida albicans: NOT DETECTED
Candida glabrata: NOT DETECTED
Candida parapsilosis: NOT DETECTED
ENTEROCOCCUS SPECIES: NOT DETECTED
Enterobacter cloacae complex: NOT DETECTED
Enterobacteriaceae species: DETECTED — AB
Escherichia coli: DETECTED — AB
Haemophilus influenzae: NOT DETECTED
Klebsiella oxytoca: NOT DETECTED
Klebsiella pneumoniae: NOT DETECTED
LISTERIA MONOCYTOGENES: NOT DETECTED
NEISSERIA MENINGITIDIS: NOT DETECTED
PROTEUS SPECIES: NOT DETECTED
Pseudomonas aeruginosa: NOT DETECTED
SERRATIA MARCESCENS: NOT DETECTED
STAPHYLOCOCCUS AUREUS BCID: NOT DETECTED
STAPHYLOCOCCUS SPECIES: NOT DETECTED
STREPTOCOCCUS PNEUMONIAE: NOT DETECTED
Streptococcus agalactiae: NOT DETECTED
Streptococcus pyogenes: NOT DETECTED
Streptococcus species: NOT DETECTED

## 2016-01-15 LAB — I-STAT CG4 LACTIC ACID, ED: LACTIC ACID, VENOUS: 3.9 mmol/L — AB (ref 0.5–1.9)

## 2016-01-15 LAB — LIPASE, BLOOD: LIPASE: 18 U/L (ref 11–51)

## 2016-01-15 LAB — BETA-HYDROXYBUTYRIC ACID: Beta-Hydroxybutyric Acid: >8 mmol/L — ABNORMAL HIGH (ref 0.05–0.27)

## 2016-01-15 LAB — MAGNESIUM: Magnesium: 2.9 mg/dL — ABNORMAL HIGH (ref 1.7–2.4)

## 2016-01-15 LAB — BILIRUBIN, DIRECT: BILIRUBIN DIRECT: 1 mg/dL — AB (ref 0.1–0.5)

## 2016-01-15 LAB — PHOSPHORUS: Phosphorus: 3.9 mg/dL (ref 2.5–4.6)

## 2016-01-15 LAB — MRSA PCR SCREENING: MRSA BY PCR: NEGATIVE

## 2016-01-15 SURGERY — AMPUTATION BELOW KNEE
Anesthesia: General | Site: Leg Lower | Laterality: Right

## 2016-01-15 MED ORDER — HYDROMORPHONE HCL 1 MG/ML IJ SOLN: 0.2500 mg | INTRAMUSCULAR | Status: DC | PRN

## 2016-01-15 MED ORDER — ONDANSETRON HCL 4 MG/2ML IJ SOLN
INTRAMUSCULAR | Status: DC | PRN
  Administered 2016-01-15: 4 mg via INTRAVENOUS

## 2016-01-15 MED ORDER — PIPERACILLIN-TAZOBACTAM 3.375 G IVPB
3.3750 g | Freq: Three times a day (TID) | INTRAVENOUS | Status: DC
  Administered 2016-01-15 – 2016-01-18 (×10): 3.375 g via INTRAVENOUS
  Filled 2016-01-15 (×12): qty 50

## 2016-01-15 MED ORDER — DEXTROSE-NACL 5-0.45 % IV SOLN: INTRAVENOUS | Status: DC

## 2016-01-15 MED ORDER — PROPOFOL 10 MG/ML IV BOLUS
INTRAVENOUS | Status: DC | PRN
  Administered 2016-01-15: 100 mg via INTRAVENOUS

## 2016-01-15 MED ORDER — 0.9 % SODIUM CHLORIDE (POUR BTL) OPTIME
TOPICAL | Status: DC | PRN
  Administered 2016-01-15: 1000 mL

## 2016-01-15 MED ORDER — DEXTROSE-NACL 5-0.45 % IV SOLN
INTRAVENOUS | Status: DC
  Administered 2016-01-15: 21:00:00 via INTRAVENOUS

## 2016-01-15 MED ORDER — FENTANYL CITRATE (PF) 100 MCG/2ML IJ SOLN
INTRAMUSCULAR | Status: AC
  Filled 2016-01-15: qty 2

## 2016-01-15 MED ORDER — ONDANSETRON HCL 4 MG/2ML IJ SOLN
INTRAMUSCULAR | Status: AC
  Filled 2016-01-15: qty 2

## 2016-01-15 MED ORDER — SODIUM CHLORIDE 0.9 % IV SOLN: INTRAVENOUS | Status: AC

## 2016-01-15 MED ORDER — SODIUM CHLORIDE 0.9 % IV SOLN
INTRAVENOUS | Status: DC
  Administered 2016-01-15: 16.8 [IU]/h via INTRAVENOUS
  Filled 2016-01-15: qty 2.5

## 2016-01-15 MED ORDER — VANCOMYCIN HCL 10 G IV SOLR
1250.0000 mg | INTRAVENOUS | Status: DC
  Administered 2016-01-15: 1250 mg via INTRAVENOUS
  Filled 2016-01-15: qty 1250

## 2016-01-15 MED ORDER — GADOBENATE DIMEGLUMINE 529 MG/ML IV SOLN
7.0000 mL | Freq: Once | INTRAVENOUS | Status: AC | PRN
  Administered 2016-01-15: 7 mL via INTRAVENOUS

## 2016-01-15 MED ORDER — SODIUM CHLORIDE 0.9 % IV SOLN
INTRAVENOUS | Status: DC
  Administered 2016-01-15 (×3): via INTRAVENOUS

## 2016-01-15 MED ORDER — HEPARIN SODIUM (PORCINE) 5000 UNIT/ML IJ SOLN
5000.0000 [IU] | Freq: Three times a day (TID) | INTRAMUSCULAR | Status: DC
  Administered 2016-01-15 – 2016-01-18 (×9): 5000 [IU] via SUBCUTANEOUS
  Filled 2016-01-15 (×9): qty 1

## 2016-01-15 MED ORDER — PROPOFOL 10 MG/ML IV BOLUS
INTRAVENOUS | Status: AC
  Filled 2016-01-15: qty 20

## 2016-01-15 MED ORDER — LIDOCAINE 2% (20 MG/ML) 5 ML SYRINGE
INTRAMUSCULAR | Status: AC
  Filled 2016-01-15: qty 5

## 2016-01-15 MED ORDER — SUFENTANIL CITRATE 50 MCG/ML IV SOLN
INTRAVENOUS | Status: AC
  Filled 2016-01-15: qty 1

## 2016-01-15 MED ORDER — LIDOCAINE HCL (CARDIAC) 20 MG/ML IV SOLN
INTRAVENOUS | Status: DC | PRN
  Administered 2016-01-15: 50 mg via INTRATRACHEAL

## 2016-01-15 MED ORDER — SODIUM CHLORIDE 0.9 % IV SOLN: 250.0000 mL | INTRAVENOUS | Status: DC | PRN

## 2016-01-15 MED ORDER — SUCCINYLCHOLINE CHLORIDE 20 MG/ML IJ SOLN
INTRAMUSCULAR | Status: DC | PRN
  Administered 2016-01-15: 100 mg via INTRAVENOUS

## 2016-01-15 MED ORDER — SUCCINYLCHOLINE CHLORIDE 200 MG/10ML IV SOSY
PREFILLED_SYRINGE | INTRAVENOUS | Status: AC
  Filled 2016-01-15: qty 10

## 2016-01-15 MED ORDER — FENTANYL CITRATE (PF) 100 MCG/2ML IJ SOLN
INTRAMUSCULAR | Status: DC | PRN
  Administered 2016-01-15 (×2): 50 ug via INTRAVENOUS

## 2016-01-15 SURGICAL SUPPLY — 57 items
BANDAGE ACE 4X5 VEL STRL LF (GAUZE/BANDAGES/DRESSINGS) ×2 IMPLANT
BANDAGE ACE 6X5 VEL STRL LF (GAUZE/BANDAGES/DRESSINGS) ×2 IMPLANT
BANDAGE COBAN STERILE 2 (GAUZE/BANDAGES/DRESSINGS) ×2 IMPLANT
BANDAGE ELASTIC 4 LF NS (GAUZE/BANDAGES/DRESSINGS) ×2 IMPLANT
BANDAGE ELASTIC 6 VELCRO ST LF (GAUZE/BANDAGES/DRESSINGS) ×2 IMPLANT
BANDAGE ESMARK 6X9 LF (GAUZE/BANDAGES/DRESSINGS) IMPLANT
BLADE OSCILLATING /SAGITTAL (BLADE) ×2 IMPLANT
BLADE SAW SAG 29X58X.64 (BLADE) ×4 IMPLANT
BNDG COHESIVE 6X5 TAN STRL LF (GAUZE/BANDAGES/DRESSINGS) ×2 IMPLANT
BNDG ESMARK 6X9 LF (GAUZE/BANDAGES/DRESSINGS)
BNDG GAUZE ELAST 4 BULKY (GAUZE/BANDAGES/DRESSINGS) ×4 IMPLANT
CLEANER TIP ELECTROSURG 2X2 (MISCELLANEOUS) ×2 IMPLANT
COVER SURGICAL LIGHT HANDLE (MISCELLANEOUS) ×2 IMPLANT
CUFF TOURNIQUET SINGLE 34IN LL (TOURNIQUET CUFF) ×2 IMPLANT
CUFF TOURNIQUET SINGLE 44IN (TOURNIQUET CUFF) IMPLANT
DRAIN PENROSE 1/2X12 LTX STRL (WOUND CARE) IMPLANT
DRAPE HALF SHEET 40X57 (DRAPES) ×2 IMPLANT
DRAPE INCISE IOBAN 66X45 STRL (DRAPES) ×2 IMPLANT
DRAPE ORTHO SPLIT 77X108 STRL (DRAPES) ×1
DRAPE PROXIMA HALF (DRAPES) ×4 IMPLANT
DRAPE SURG ORHT 6 SPLT 77X108 (DRAPES) ×1 IMPLANT
DRAPE U-SHAPE 47X51 STRL (DRAPES) ×2 IMPLANT
DURAPREP 26ML APPLICATOR (WOUND CARE) ×2 IMPLANT
EVACUATOR 1/8 PVC DRAIN (DRAIN) IMPLANT
GAUZE SPONGE 4X4 12PLY STRL (GAUZE/BANDAGES/DRESSINGS) ×2 IMPLANT
GAUZE XEROFORM 5X9 LF (GAUZE/BANDAGES/DRESSINGS) ×2 IMPLANT
GLOVE BIOGEL PI IND STRL 8 (GLOVE) ×2 IMPLANT
GLOVE BIOGEL PI INDICATOR 8 (GLOVE) ×2
GLOVE ORTHO TXT STRL SZ7.5 (GLOVE) ×4 IMPLANT
GOWN STRL REUS W/ TWL LRG LVL3 (GOWN DISPOSABLE) ×1 IMPLANT
GOWN STRL REUS W/ TWL XL LVL3 (GOWN DISPOSABLE) ×1 IMPLANT
GOWN STRL REUS W/TWL 2XL LVL3 (GOWN DISPOSABLE) ×2 IMPLANT
GOWN STRL REUS W/TWL LRG LVL3 (GOWN DISPOSABLE) ×1
GOWN STRL REUS W/TWL XL LVL3 (GOWN DISPOSABLE) ×1
KIT BASIN OR (CUSTOM PROCEDURE TRAY) ×2 IMPLANT
KIT ROOM TURNOVER OR (KITS) ×2 IMPLANT
MANIFOLD NEPTUNE II (INSTRUMENTS) ×2 IMPLANT
NEEDLE MAYO TROCAR (NEEDLE) ×2 IMPLANT
NS IRRIG 1000ML POUR BTL (IV SOLUTION) ×2 IMPLANT
PACK GENERAL/GYN (CUSTOM PROCEDURE TRAY) ×2 IMPLANT
PAD ABD 8X10 STRL (GAUZE/BANDAGES/DRESSINGS) ×4 IMPLANT
PAD ARMBOARD 7.5X6 YLW CONV (MISCELLANEOUS) ×4 IMPLANT
PAD CAST 4YDX4 CTTN HI CHSV (CAST SUPPLIES) ×1 IMPLANT
PADDING CAST COTTON 4X4 STRL (CAST SUPPLIES) ×1
SPONGE GAUZE 4X4 12PLY STER LF (GAUZE/BANDAGES/DRESSINGS) ×2 IMPLANT
SPONGE LAP 18X18 X RAY DECT (DISPOSABLE) ×2 IMPLANT
STAPLER VISISTAT 35W (STAPLE) ×2 IMPLANT
STOCKINETTE IMPERVIOUS LG (DRAPES) IMPLANT
SUT SILK 3 0 SH CR/8 (SUTURE) ×2 IMPLANT
SUT VIC AB 0 CT1 27 (SUTURE) ×1
SUT VIC AB 0 CT1 27XBRD ANBCTR (SUTURE) ×1 IMPLANT
SUT VIC AB 1 CTX 18 (SUTURE) ×2 IMPLANT
SUT VICRYL 0 TIES 12 18 (SUTURE) ×2 IMPLANT
SUT VICRYL AB 2 0 TIES (SUTURE) ×2 IMPLANT
TOWEL OR 17X24 6PK STRL BLUE (TOWEL DISPOSABLE) ×2 IMPLANT
TOWEL OR 17X26 10 PK STRL BLUE (TOWEL DISPOSABLE) ×2 IMPLANT
WATER STERILE IRR 1000ML POUR (IV SOLUTION) ×2 IMPLANT

## 2016-01-15 NOTE — Anesthesia Procedure Notes (Signed)
Procedure Name: Intubation Date/Time: 01/15/2016 6:46 PM Performed by: Nicholos JohnsMCPHAIL, Liylah Najarro S Pre-anesthesia Checklist: Patient identified, Emergency Drugs available, Suction available, Patient being monitored and Timeout performed Patient Re-evaluated:Patient Re-evaluated prior to inductionOxygen Delivery Method: Circle system utilized Preoxygenation: Pre-oxygenation with 100% oxygen Intubation Type: IV induction Ventilation: Mask ventilation without difficulty Laryngoscope size: glidescope. Tube type: Oral Tube size: 7.5 mm Airway Equipment and Method: Video-laryngoscopy Placement Confirmation: ETT inserted through vocal cords under direct vision,  positive ETCO2 and breath sounds checked- equal and bilateral Secured at: 22 cm Tube secured with: Tape Dental Injury: Teeth and Oropharynx as per pre-operative assessment

## 2016-01-15 NOTE — Transfer of Care (Signed)
Immediate Anesthesia Transfer of Care Note  Patient: Gordon CousinFrederick Swarey  Procedure(s) Performed: Procedure(s): GUILLOTINE AMPUTATION  ABOVE THE ANKLE AMPUTATION (Right)  Patient Location: PACU  Anesthesia Type:General  Level of Consciousness: awake, alert  and oriented  Airway & Oxygen Therapy: Patient Spontanous Breathing and Patient connected to face mask oxygen  Post-op Assessment: Report given to RN and Post -op Vital signs reviewed and stable  Post vital signs: Reviewed and stable  Last Vitals:  Vitals:   01/15/16 1500 01/15/16 1805  BP: (!) 151/64 (!) 142/72  Pulse: (!) 109 (!) 102  Resp: (!) 34 (!) 25  Temp:  37.4 C    Last Pain:  Vitals:   01/15/16 1805  TempSrc: Oral  PainSc:          Complications: No apparent anesthesia complications

## 2016-01-15 NOTE — Progress Notes (Signed)
PHARMACY - PHYSICIAN COMMUNICATION CRITICAL VALUE ALERT - BLOOD CULTURE IDENTIFICATION (BCID)  Results for orders placed or performed during the hospital encounter of 01/14/16  Blood Culture ID Panel (Reflexed) (Collected: 01/14/2016 10:07 PM)  Result Value Ref Range   Enterococcus species NOT DETECTED NOT DETECTED   Listeria monocytogenes NOT DETECTED NOT DETECTED   Staphylococcus species NOT DETECTED NOT DETECTED   Staphylococcus aureus NOT DETECTED NOT DETECTED   Streptococcus species NOT DETECTED NOT DETECTED   Streptococcus agalactiae NOT DETECTED NOT DETECTED   Streptococcus pneumoniae NOT DETECTED NOT DETECTED   Streptococcus pyogenes NOT DETECTED NOT DETECTED   Acinetobacter baumannii NOT DETECTED NOT DETECTED   Enterobacteriaceae species DETECTED (A) NOT DETECTED   Enterobacter cloacae complex NOT DETECTED NOT DETECTED   Escherichia coli DETECTED (A) NOT DETECTED   Klebsiella oxytoca NOT DETECTED NOT DETECTED   Klebsiella pneumoniae NOT DETECTED NOT DETECTED   Proteus species NOT DETECTED NOT DETECTED   Serratia marcescens NOT DETECTED NOT DETECTED   Carbapenem resistance NOT DETECTED NOT DETECTED   Haemophilus influenzae NOT DETECTED NOT DETECTED   Neisseria meningitidis NOT DETECTED NOT DETECTED   Pseudomonas aeruginosa NOT DETECTED NOT DETECTED   Candida albicans NOT DETECTED NOT DETECTED   Candida glabrata NOT DETECTED NOT DETECTED   Candida krusei NOT DETECTED NOT DETECTED   Candida parapsilosis NOT DETECTED NOT DETECTED   Candida tropicalis NOT DETECTED NOT DETECTED    Name of physician (or Provider) Contacted: Dr. Isaiah SergeMannam  Changes to prescribed antibiotics required: Patient is currently on vancomycin and zosyn for sepsis from possible GI source. He is also currently in the operating room for an amputation. The physician would like both antibiotics continued for now and to reassess the patient after surgery.   Milus MallickMargaret E Palacios [facility] Medical Centerhuda 01/15/2016  6:30 PM

## 2016-01-15 NOTE — H&P (Signed)
PULMONARY / CRITICAL CARE MEDICINE   Name: Gordon Walker Danielsen MRN: [id number] DOB: August 24, 1965    ADMISSION DATE:  01/14/2016 CONSULTATION DATE:  01/15/16  REFERRING MD:  Dr Drema PryPedro Cardama  CHIEF COMPLAINT:  Diabetic ketoacidosis  HISTORY OF PRESENT ILLNESS:   Gordon Walker Kirsh is a 50 y.o. male with DM type 1, HTN and peripheral vasular disease who [facility].   Mr Brynda GreathouseRandall was recently operated on for osteomyelitis of his right 5th toe with partial amputation of the toe and his right foot in 12/30/15. He was given bactrim for antibiotic treatment around that time. For the past few days, he has been having nausea and dry heaving with associated dull epigastric pain that he cannot describe further today due to confusion that started earlier today per his wife. He has been more tired and having several watery loose stools as well. No fevers, chills, sweats reported. Due to extreme fatigue and increased work of breathing, his wife brought him to the ED today where he was found to be in DKA with AKI. He was started on Zosyn, Vancomycin, given 3 L of fluids and started on IV insulin therapy.   PAST MEDICAL HISTORY :  He  has a past medical history of Diabetes mellitus without complication (HCC); Heart murmur; Hypertension; and 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, with long-term current use of insulin (HCC).  PAST SURGICAL HISTORY: He  has a past surgical history that includes Amputation (Left, 01/07/2015); Amputation (Left, 07/15/2015); and Amputation (Right, 12/30/2015).  No Known Allergies  No current facility-administered medications on file prior to encounter.    Current Outpatient Prescriptions on File Prior to Encounter  Medication Sig  . atorvastatin (LIPITOR) 20 MG tablet Take 20 mg by mouth daily.  Marland Kitchen. glucagon 1 MG injection Inject 1 mg into the muscle once as needed. (Patient taking differently: Inject 1 mg  into the muscle daily as needed (hypoglycemia). )  . insulin aspart (NOVOLOG FLEXPEN) 100 UNIT/ML FlexPen Inject 9 Units into the skin 3 (three) times daily with meals. And pen needles 3/day  . insulin glargine (LANTUS) 100 UNIT/ML injection Inject 0.27 mLs (27 Units total) into the skin at bedtime. Sliding scale (Patient taking differently: Inject 0-27 Units into the skin at bedtime. Sliding scale)  . lisinopril (PRINIVIL,ZESTRIL) 10 MG tablet Take 10 mg by mouth daily.   . Multiple Vitamin (MULTIVITAMIN WITH MINERALS) TABS tablet Take 1 tablet by mouth daily.  . Polyvinyl Alcohol (LUBRICANT DROPS OP) Apply 1 drop to eye daily as needed (dry eyes).    FAMILY HISTORY:  His indicated that his mother is alive. He indicated that his father is alive.    SOCIAL HISTORY: He  reports that he has never smoked. He has never used smokeless tobacco. He reports that he does not drink alcohol or use drugs.  REVIEW OF SYSTEMS:   Unable to be obtained as patient is delirious    VITAL SIGNS: BP 152/70   Pulse 106   Temp 99.6 F (37.6 C) (Oral)   Resp (!) 35   Wt 84.4 kg (186 lb)   SpO2 100%   BMI 25.23 kg/m   PHYSICAL EXAMINATION: General:  Alert, oriented to person only Neuro:  Delirious, moves all extremities normally when prompted HEENT:  Dry mucous membranes, clear oropharynx Cardiovascular:  Tachycardic, no appreciable murmurs rubs or gallops Lungs:  CTAB, tachypneic, increased work of breathing Abdomen:  Soft, tender to palpation over  epigastrium with voluntary guarding at times Musculoskeletal:  S/p partial foot amputations bilaterally Skin:  Well perfused  LABS:  BMET  Recent Labs Lab 01/14/16 2143 01/14/16 2212  NA 115* 113*  K >7.5* 7.6*  CL 75* 82*  CO2 <7*  --   BUN 83* 75*  CREATININE 3.52* 2.70*  GLUCOSE 1,003* >700*    Electrolytes  Recent Labs Lab 01/14/16 2143  CALCIUM 8.1*    CBC  Recent Labs Lab 01/14/16 2143 01/14/16 2212  WBC 38.7*  --    HGB 9.7* 11.2*  HCT 31.4* 33.0*  PLT 408*  --     Coag's No results for input(s): APTT, INR in the last 168 hours.  Sepsis Markers  Recent Labs Lab 01/14/16 2213 01/15/16 0111  LATICACIDVEN 5.44* 3.90*    ABG  Recent Labs Lab 01/14/16 2226  PHART 7.130*  PCO2ART BELOW REPORTABLE RANGE  PO2ART 124*    Liver Enzymes  Recent Labs Lab 01/14/16 2143  AST 80*  ALT 67*  ALKPHOS 266*  BILITOT 3.0*  ALBUMIN 2.0*    Cardiac Enzymes No results for input(s): TROPONINI, PROBNP in the last 168 hours.  Glucose  Recent Labs Lab 01/14/16 2141 01/14/16 2300 01/15/16 0007 01/15/16 0110  GLUCAP >600* >600* >600* >600*    Imaging Dg Chest Portable 1 View  Result Date: 01/14/2016 CLINICAL DATA:  Altered mental status.  Hyperglycemia. EXAM: PORTABLE CHEST 1 VIEW COMPARISON:  None. FINDINGS: A single AP portable view of the chest demonstrates no focal airspace consolidation or alveolar edema. The lungs are grossly clear. There is no large effusion or pneumothorax. Cardiac and mediastinal contours appear unremarkable. IMPRESSION: No active disease. Electronically Signed   By: Ellery Plunkaniel R Mitchell M.D.   On: 01/14/2016 22:16   DISCUSSION: Gordon Walker Thebeau is a 50 y.o. male with DM type 1, peripheral vascular disease s/p recent partial amputation of the right foot for chronic osteomyelitis who presents in DKA. Etiology unknown at this time though I suspect a sepsis syndrome with GI source. He has severe metabolic derangements including pseudohyponatremia, hyperkalemia, lactic acidosis and acute renal failure.   ASSESSMENT / PLAN:  PULMONARY A: Increased work of breathing to compensate for severe metabolic acidosis. Protecting airway. No indication for mechanical ventilation currently P:   Continue monitoring  CARDIOVASCULAR A:  No issues currently P:  Telemetry  RENAL A:   AKI Hyponatremia (likely pseudohyponatremia) Hyperkalemia - exacerbated by metabolic  acidosis and AKI Mixed Lactic and ketotic metabolic acidosis P:   Aggressive fluid resuscitation underway with improvement in lactic acidosis. BMPs q4h  GASTROINTESTINAL A:   Nausea, vomiting Hyperbilirubinemia P:   Will check RUQ ultrasound, direct bilirubin, lipase, C diff screen, KUB  HEMATOLOGIC A:   Leukocytosis likely from sepsis P:  Will monitor  INFECTIOUS A:   Presumed sepsis from GI source P:   Workup as above plus blood cultures, C diff screen Continue Zosyn and Vancomycin  ENDOCRINE A:   Diabetic ketoacidosis confirmed with ketonuria P:   DKA Protocol initiated  NEUROLOGIC A:   Acute delirium P:   Will use supportive therapy and correction of metabolic derrangements    FAMILY  - Updates: Wife updated at bedside  - Inter-disciplinary family meet or Palliative Care meeting due by:  01/22/16  Cornell BarmanArun Marne [facility] Pager: [phone]  01/15/2016, 1:38 AM

## 2016-01-15 NOTE — Progress Notes (Addendum)
PULMONARY / CRITICAL CARE MEDICINE   Name: Gordon Walker MRN: [id number] DOB: 02-Nov-1965    ADMISSION DATE:  01/14/2016 CONSULTATION DATE:  01/15/16  REFERRING MD:  Dr Drema PryPedro Cardama  CHIEF COMPLAINT:  Diabetic ketoacidosis  HISTORY OF PRESENT ILLNESS:   Gordon Walker is a 50 y.o. male with DM type 1, HTN and peripheral vasular disease who [facility] with D[facility] earlier today.   Gordon Walker was recently operated on for osteomyelitis of his right 5th toe with partial amputation of the toe and his right foot in 12/30/15. He was given bactrim for antibiotic treatment around that time. For the past few days, he has been having nausea and dry heaving with associated dull epigastric pain that he cannot describe further today due to confusion that started earlier today per his wife. He has been more tired and having several watery loose stools as well. No fevers, chills, sweats reported. Due to extreme fatigue and increased work of breathing, his wife brought him to the ED today where he was found to be in DKA with AKI. He was started on Zosyn, Vancomycin, given 3 L of fluids and started on IV insulin therapy.     VITAL SIGNS: BP (!) 141/75   Pulse (!) 103   Temp 99.4 F (37.4 C) (Oral)   Resp (!) 27   Ht 6' (1.829 m)   Wt 172 lb 13.5 oz (78.4 kg)   SpO2 100%   BMI 23.44 kg/m   PHYSICAL EXAMINATION: General:  Alert, oriented to person only Neuro:  lethargic, moves all extremities normally when prompted HEENT:  Dry mucous membranes, clear oropharynx Cardiovascular:  Tachycardic, no appreciable murmurs rubs or gallops Lungs:  CTAB, kussmaul breathing Abdomen:  Soft, tender to palpation over epigastrium with voluntary guarding at times Musculoskeletal:  S/p partial foot amputations bilaterally Skin:  Rt foot wound -malodorous discharge, swollen  LABS:  BMET  Recent Labs Lab 01/15/16 0216 01/15/16 0448 01/15/16 0914  NA 121* 125* 130*  K 6.7* 6.3* 5.1  CL  87* 92* 99*  CO2 7* 11* 20*  BUN 83* 83* 74*  CREATININE 3.79* 3.42* 2.56*  GLUCOSE 862* 770* 481*    Electrolytes  Recent Labs Lab 01/15/16 0216 01/15/16 0448 01/15/16 0914  CALCIUM 7.4* 7.6* 7.7*  MG  --  2.9*  --   PHOS  --  3.9  --     CBC  Recent Labs Lab 01/14/16 2143 01/14/16 2212 01/15/16 0448 01/15/16 0914  WBC 38.7*  --  28.4* 25.2*  HGB 9.7* 11.2* 9.7* 9.2*  HCT 31.4* 33.0* 28.7* 26.2*  PLT 408*  --  348 331    Coag's No results for input(s): APTT, INR in the last 168 hours.  Sepsis Markers  Recent Labs Lab 01/14/16 2213 01/15/16 0111  LATICACIDVEN 5.44* 3.90*    ABG  Recent Labs Lab 01/14/16 2226  PHART 7.130*  PCO2ART BELOW REPORTABLE RANGE  PO2ART 124*    Liver Enzymes  Recent Labs Lab 01/14/16 2143  AST 80*  ALT 67*  ALKPHOS 266*  BILITOT 3.0*  ALBUMIN 2.0*    Cardiac Enzymes No results for input(s): TROPONINI, PROBNP in the last 168 hours.  Glucose  Recent Labs Lab 01/15/16 0416 01/15/16 0519 01/15/16 0616 01/15/16 0724 01/15/16 0821 01/15/16 0925  GLUCAP >600* >600* >600* 521* 561* 445*    Imaging Dg Chest Portable 1 View  Result Date: 01/14/2016 CLINICAL DATA:  Altered mental status.  Hyperglycemia. EXAM: PORTABLE CHEST 1  VIEW COMPARISON:  None. FINDINGS: A single AP portable view of the chest demonstrates no focal airspace consolidation or alveolar edema. The lungs are grossly clear. There is no large effusion or pneumothorax. Cardiac and mediastinal contours appear unremarkable. IMPRESSION: No active disease. Electronically Signed   By: Ellery Plunkaniel R Mitchell M.D.   On: 01/14/2016 22:16   Dg Abd Portable 1v  Result Date: 01/15/2016 CLINICAL DATA:  Abdominal pain. EXAM: PORTABLE ABDOMEN - 1 VIEW COMPARISON:  None. FINDINGS: Bowel gas pattern is nonobstructive. No evidence of free peritoneal air. Nonspecific calcific densities just left of the spine in the mid abdomen. Remaining bones and soft tissues are within  normal. IMPRESSION: Nonobstructive bowel gas pattern. Electronically Signed   By: Elberta Fortisaniel  Boyle M.D.   On: 01/15/2016 08:18   [place] Abdomen Limited Ruq  Result Date: 01/15/2016 CLINICAL DATA:  Hyperbilirubinemia, type I diabetes mellitus, hypertension EXAM: US ABDOMEN LIMITED - RIGHT UPPER QUADRANT COMPARISON:  None FINDINGS: Gallbladder: Normally distended with dependent sludge. No gallbladder wall thickening, pericholecystic fluid, definite calculi, or sonographic Murphy sign. Common bile duct: Diameter: Normal caliber 2 mm diameter Liver: Mildly heterogeneous. Hepatopetal portal venous flow. No definite hepatic mass or nodularity. No RIGHT upper quadrant ascites. Small RIGHT pleural effusion incidentally noted. IMPRESSION: Small none dependent sludge within gallbladder. No evidence of cholelithiasis, cholecystitis or biliary dilatation. Small RIGHT pleural effusion. Electronically Signed   By: Ulyses SouthwardMark  Boles M.D.   On: 01/15/2016 09:56   DISCUSSION: Gordon Walker is a [age] y.o. male with DM type 1, peripheral vascular disease s/p recent partial amputation of the right foot for chronic osteomyelitis who presents in DKA. Etiology unknown at this time though I suspect a sepsis syndrome with GI source. He has severe metabolic derangements including pseudohyponatremia, hyperkalemia, lactic acidosis and acute renal failure.   ASSESSMENT / PLAN:  PULMONARY A: Increased work of breathing to compensate for severe metabolic acidosis. Protecting airway. No indication for mechanical ventilation currently P:   Continue monitoring  CARDIOVASCULAR A:  No issues currently P:  Telemetry  RENAL A:   AKI - dehydration + bactrim Hyponatremia (likely pseudohyponatremia) Hyperkalemia - exacerbated by metabolic acidosis and AKI -resolving Mixed Lactic and ketotic metabolic acidosis P:   Aggressive fluid resuscitation underway with improvement in lactic acidosis. BMPs q4h  GASTROINTESTINAL A:   Nausea,  vomiting Hyperbilirubinemia  RUQ ultrasound neg P:     HEMATOLOGIC A:   Leukocytosis likely from sepsis P:  Will monitor  INFECTIOUS A:   Presumed sepsis RT foot P:   Workup as above plus blood cultures, C diff screen Continue Zosyn and Vancomycin XR rt foot -will ask dr duda to FU  ENDOCRINE A:   Diabetic ketoacidosis confirmed with ketonuria P:   DKA Protocol initiated, AG has resolved, once CBGs down to 250 range will switch to lantus  NEUROLOGIC A:   Acute delirium P:   Will use supportive therapy and correction of metabolic derrangements    FAMILY  - Updates: Wife updated at bedside  - Inter-disciplinary family meet or Palliative Care meeting due by:  01/22/16  cctime x 3047m  Cyril Mourningakesh Trinh Sanjose MD. FCCP. [facility] Pager [phone] If no response call [phone]    01/15/2016, 10:54 AM   Addendum  -  XR reviewed - d/w dr yates  Who will see pt MRI foot ordered I am concerned about gas forming organism  Oretha MilchALVA,Dequita Schleicher V. MD

## 2016-01-15 NOTE — Progress Notes (Signed)
Pharmacy Antibiotic Note  Gordon Walker is a 50 y.o. male admitted on 01/14/2016 with sepsis.  Pharmacy has been consulted for Vancomycin/Zosyn dosing. Pt with recent amputation. Here with altered mental status. Found to be in DKA and acute renal failure. Also found to have elevated WBC.   Plan: Vancomycin 1250 mg IV q24h Zosyn 3.375G IV q8h to be infused over 4 hours Trend WBC, temp, renal function  F/U infectious work-up Drug levels as indicated   Weight: 186 lb (84.4 kg)  Temp (24hrs), Avg:99.6 F (37.6 C), Min:99.6 F (37.6 C), Max:99.6 F (37.6 C)   Recent Labs Lab 01/14/16 2143 01/14/16 2212 01/14/16 2213  WBC 38.7*  --   --   CREATININE 3.52* 2.70*  --   LATICACIDVEN  --   --  5.44*    Estimated Creatinine Clearance: 36.7 mL/min (by C-G formula based on SCr of 2.7 mg/dL (H)).    No Known Allergies   Gordon Walker, Gordon Walker 01/15/2016 12:16 AM

## 2016-01-15 NOTE — Anesthesia Preprocedure Evaluation (Addendum)
Anesthesia Evaluation  Patient identified by MRN, date of birth, ID band Patient awake    Reviewed: Allergy & Precautions, H&P , NPO status , Patient's Chart, lab work & pertinent test results  Airway Mallampati: III  TM Distance: >3 FB Neck ROM: Full    Dental no notable dental hx. (+) Teeth Intact, Dental Advisory Given   Pulmonary neg pulmonary ROS,    Pulmonary exam normal breath sounds clear to auscultation       Cardiovascular hypertension, Pt. on medications  Rhythm:Regular Rate:Tachycardia     Neuro/Psych negative neurological ROS  negative psych ROS   GI/Hepatic negative GI ROS, Neg liver ROS,   Endo/Other  diabetes, Insulin Dependent  Renal/GU Renal InsufficiencyRenal disease  negative genitourinary   Musculoskeletal   Abdominal   Peds  Hematology negative hematology ROS (+) anemia ,   Anesthesia Other Findings   Reproductive/Obstetrics negative OB ROS                            Anesthesia Physical Anesthesia Plan  ASA: III and emergent  Anesthesia Plan: General   Post-op Pain Management:    Induction: Intravenous  Airway Management Planned: Oral ETT  Additional Equipment:   Intra-op Plan:   Post-operative Plan: Extubation in OR  Informed Consent: I have reviewed the patients History and Physical, chart, labs and discussed the procedure including the risks, benefits and alternatives for the proposed anesthesia with the patient or authorized representative who has indicated his/her understanding and acceptance.   Dental advisory given  Plan Discussed with: CRNA  Anesthesia Plan Comments:        Anesthesia Quick Evaluation

## 2016-01-15 NOTE — Op Note (Signed)
Preop diagnosis: Gas gangrene right foot, postop fifth ray resection.  Postop diagnosis: Same  Procedure: Right guillotine amputation above the ankle.  Surgeon: Annell GreeningMark Christel Bai M.D.  Anesthesia: Gen.  Brief history 50 year old male with the very poorly controlled diabetes with A1c centered been 10-13 range and fifth ray amputation by Dr. Lajoyce Cornersuda on 01/09/2016. He was recently seen in the office placed on some doxycycline and then presented to the hospital with the extreme sepsis, 38,000 white count and diabetic ketoacidosis with sugars above 700. X-rays foot showed gas gangrene he had some dehiscence of the lateral wound with gas plantar surface dorsal surface and an MRI scan showed even gas inside the bones of the foot. By plain x-ray appeared that the gas stopped about the ankle joint and he is brought to the operating room for emergent guillotine amputation.  Procedure after induction general anesthesia proximal thigh tourniquet scrub and prep extremity sheets drapes were applied timeout procedure was completed patient started been on double antibiotics in the ICU. Incision was made just above the ankle transverse amputation was performed with the oscillating saw. Posterior tibial artery and vein were the only vessel appear to have some blood coming out of it other than slight oozing from the muscle there is no purulence at this level in the soft tissue is evident. Cautery was used for hemostasis and tourniquet was deflated after being out for 2 minutes. End of the stump was the controlled with the Bovie irrigated and then 4 x 4's ABDs and web roll and Ace and Coban was applied for dressing. Patient tolerated the procedure well and will be the restarted on his insulin drip in the PACU

## 2016-01-15 NOTE — ED Notes (Signed)
Surgical incision to RIGHT foot appears infected. White/ pink tissue- some yellow drainage. Incision appears to be opening.

## 2016-01-15 NOTE — Anesthesia Postprocedure Evaluation (Signed)
Anesthesia Post Note  Patient: Charletta CousinFrederick Niday  Procedure(s) Performed: Procedure(s) (LRB): GUILLOTINE AMPUTATION  ABOVE THE ANKLE AMPUTATION (Right)  Patient location during evaluation: PACU Anesthesia Type: General Level of consciousness: awake and alert Pain management: pain level controlled Vital Signs Assessment: post-procedure vital signs reviewed and stable Respiratory status: spontaneous breathing, nonlabored ventilation, respiratory function stable and patient connected to nasal cannula oxygen Cardiovascular status: blood pressure returned to baseline and stable Postop Assessment: no signs of nausea or vomiting Anesthetic complications: no       Last Vitals:  Vitals:   01/15/16 1930 01/15/16 1945  BP: (!) 147/70 (!) 142/66  Pulse: (!) 105 (!) 104  Resp: (!) 34 (!) 34  Temp:  (!) 38.2 C    Last Pain:  Vitals:   01/15/16 1805  TempSrc: Oral  PainSc:                  Tosha Belgarde,W. EDMOND

## 2016-01-15 NOTE — Brief Op Note (Signed)
01/14/2016 - 01/15/2016  7:13 PM  PATIENT:  Gordon Walker  50 y.o. male  PRE-OPERATIVE DIAGNOSIS:  gas gangrene right foot  POST-OPERATIVE DIAGNOSIS:  same  PROCEDURE:  Procedure(s): GUILLOTINE AMPUTATION (Right) ( above the ankle joint trans tib amputation)  SURGEON:  Surgeon(s) and Role:    * Eldred MangesMark C Terrianne Cavness, MD - Primary  PHYSICIAN ASSISTANT:   ASSISTANTS: none   ANESTHESIA:   general  EBL:  Total I/O In: 250 [I.V.:250] Out: 50 [Blood:50]  BLOOD ADMINISTERED:none  DRAINS: none   LOCAL MEDICATIONS USED:  NONE  SPECIMEN:  Source of Specimen:  amputation   DISPOSITION OF SPECIMEN:  PATHOLOGY  COUNTS:  YES  TOURNIQUET:   Total Tourniquet Time Documented: Thigh (Right) - 5 minutes Total: Thigh (Right) - 5 minutes   DICTATION: .Other Dictation: Dictation Number [id number]  PLAN OF CARE: back to ICU  PATIENT DISPOSITION:  PACU - hemodynamically stable.   Delay start of Pharmacological VTE agent (>24hrs) due to surgical blood loss or risk of bleeding: not applicable

## 2016-01-15 NOTE — Consult Note (Signed)
Reason for Consult:gas gangrene right foot Referring Physician: Elsworth Soho  MD   ICU  Gordon Walker is an 50 y.o. male.  HPI: 50yo IDDM with   Past Medical History:  Diagnosis Date  . Diabetes mellitus without complication (HCC)    Type 1  . Heart murmur    "years ago"  . Hypertension    "years ago"  . 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, with long-term current use of insulin [facility])     Past Surgical History:  Procedure Laterality Date  . AMPUTATION Left 01/07/2015   Procedure: Left Foot 1st Ray Amputation;  Surgeon: Newt Minion, MD;  Location: [facility];  Service: Orthopedics;  Laterality: Left;  . AMPUTATION Left 07/15/2015   Procedure: Left Transmetatarsal Amputation;  Surgeon: Newt Minion, MD;  Location: [facility];  Service: Orthopedics;  Laterality: Left;  . AMPUTATION Right 12/30/2015   Procedure: 5th Ray Amputation Right Foot;  Surgeon: Newt Minion, MD;  Location: [employer];  Service: Orthopedics;  Laterality: Right;    Family History  Problem Relation Age of Onset  . Hypertension Mother   . Diabetes Mother   . Cancer Mother   . Prostate cancer Father     Social History:  reports that he has never smoked. He has never used smokeless tobacco. He reports that he does not drink alcohol or use drugs.  Allergies: No Known Allergies  Medications: I have reviewed the patient's current medications.  Results for orders placed or performed during the hospital encounter of 01/14/16 (from the past 48 hour(s))  CBG monitoring, ED     Status: Abnormal   Collection Time: 01/14/16  9:41 PM  Result Value Ref Range   Glucose-Capillary >600 (HH) 65 - 99 mg/dL   Comment 1 Notify RN   Urinalysis, Routine w reflex microscopic     Status: Abnormal   Collection Time: 01/14/16  9:43 PM  Result Value Ref Range   Color, Urine YELLOW YELLOW   APPearance HAZY (A) CLEAR   Specific Gravity, Urine 1.016 1.005 - 1.030   pH 5.0 5.0 - 8.0   Glucose, UA >=500  (A) NEGATIVE mg/dL   Hgb urine dipstick SMALL (A) NEGATIVE   Bilirubin Urine NEGATIVE NEGATIVE   Ketones, ur 20 (A) NEGATIVE mg/dL   Protein, ur NEGATIVE NEGATIVE mg/dL   Nitrite NEGATIVE NEGATIVE   Leukocytes, UA NEGATIVE NEGATIVE   RBC / HPF 0-5 0 - 5 RBC/hpf   WBC, UA 0-5 0 - 5 WBC/hpf   Bacteria, UA RARE (A) NONE SEEN   Squamous Epithelial / LPF NONE SEEN NONE SEEN   Hyaline Casts, UA PRESENT   CBC with Differential (PNL)     Status: Abnormal   Collection Time: 01/14/16  9:43 PM  Result Value Ref Range   WBC 38.7 (H) 4.0 - 10.5 K/uL   RBC 3.46 (L) 4.22 - 5.81 MIL/uL   Hemoglobin 9.7 (L) 13.0 - 17.0 g/dL   HCT 31.4 (L) 39.0 - 52.0 %   MCV 90.8 78.0 - 100.0 fL   MCH 28.0 26.0 - 34.0 pg   MCHC 30.9 30.0 - 36.0 g/dL   RDW 13.9 11.5 - 15.5 %   Platelets 408 (H) 150 - 400 K/uL   Neutrophils Relative % 55 %   Lymphocytes Relative 7 %   Monocytes Relative 13 %   Eosinophils Relative 1 %   Basophils Relative 0 %   Band Neutrophils 24 %   Metamyelocytes Relative  0 %   Myelocytes 0 %   Promyelocytes Absolute 0 %   Blasts 0 %   nRBC 0 0 /100 WBC   Neutro Abs 30.6 (H) 1.7 - 7.7 K/uL   Lymphs Abs 2.7 0.7 - 4.0 K/uL   Monocytes Absolute 5.0 (H) 0.1 - 1.0 K/uL   Eosinophils Absolute 0.4 0.0 - 0.7 K/uL   Basophils Absolute 0.0 0.0 - 0.1 K/uL   RBC Morphology POLYCHROMASIA PRESENT    WBC Morphology INCREASED BANDS (>20% BANDS)     Comment: MODERATE LEFT SHIFT (>5% METAS AND MYELOS,OCC PRO NOTED) ATYPICAL LYMPHOCYTES    Smear Review LARGE PLATELETS PRESENT   Comprehensive metabolic panel     Status: Abnormal   Collection Time: 01/14/16  9:43 PM  Result Value Ref Range   Sodium 115 (LL) 135 - 145 mmol/L    Comment: CRITICAL RESULT CALLED TO, READ BACK BY AND VERIFIED WITH: FRANCISCO J,RN 01/14/16 2248 WAYK    Potassium >7.5 (HH) 3.5 - 5.1 mmol/L    Comment: NO VISIBLE HEMOLYSIS CRITICAL RESULT CALLED TO, READ BACK BY AND VERIFIED WITH: FRANCISCO J,RN 01/14/16 2248 WAYK     Chloride 75 (L) 101 - 111 mmol/L   CO2 <7 (L) 22 - 32 mmol/L   Glucose, Bld 1,003 (HH) 65 - 99 mg/dL    Comment: CRITICAL RESULT CALLED TO, READ BACK BY AND VERIFIED WITH: FRANCISCO J,RN 01/14/16 2248 WAYK    BUN 83 (H) 6 - 20 mg/dL   Creatinine, Ser 3.52 (H) 0.61 - 1.24 mg/dL   Calcium 8.1 (L) 8.9 - 10.3 mg/dL   Total Protein 6.8 6.5 - 8.1 g/dL   Albumin 2.0 (L) 3.5 - 5.0 g/dL   AST 80 (H) 15 - 41 U/L   ALT 67 (H) 17 - 63 U/L   Alkaline Phosphatase 266 (H) 38 - 126 U/L   Total Bilirubin 3.0 (H) 0.3 - 1.2 mg/dL   GFR calc non Af Amer 19 (L) >60 mL/min   GFR calc Af Amer 22 (L) >60 mL/min    Comment: (NOTE) The eGFR has been calculated using the CKD EPI equation. This calculation has not been validated in all clinical situations. eGFR's persistently <60 mL/min signify possible Chronic Kidney Disease.   Blood Culture (routine x 2)     Status: None (Preliminary result)   Collection Time: 01/14/16 10:07 PM  Result Value Ref Range   Specimen Description BLOOD RIGHT HAND    Special Requests IN PEDIATRIC BOTTLE 1CC    Culture  Setup Time      GRAM NEGATIVE RODS IN PEDIATRIC BOTTLE Organism ID to follow    Culture PENDING    Report Status PENDING   I-Stat Chem 8, ED  (not at [facility], [facility])     Status: Abnormal   Collection Time: 01/14/16 10:12 PM  Result Value Ref Range   Sodium 113 (LL) 135 - 145 mmol/L   Potassium 7.6 (HH) 3.5 - 5.1 mmol/L   Chloride 82 (L) 101 - 111 mmol/L   BUN 75 (H) 6 - 20 mg/dL   Creatinine, Ser 2.70 (H) 0.61 - 1.24 mg/dL   Glucose, Bld >700 (HH) 65 - 99 mg/dL   Calcium, Ion 0.94 (L) 1.15 - 1.40 mmol/L   TCO2 8 0 - 100 mmol/L   Hemoglobin 11.2 (L) 13.0 - 17.0 g/dL   HCT 33.0 (L) 39.0 - 52.0 %   Comment NOTIFIED PHYSICIAN   I-Stat Venous Blood Gas, ED  ([facility])  Status: Abnormal   Collection Time: 01/14/16 10:12 PM  Result Value Ref Range   pH, Ven 7.120 (LL) 7.250 - 7.430   pCO2, Ven 15.4 (LL) 44.0 - 60.0 mmHg   pO2, Ven 42.0 32.0 - 45.0 mmHg    Bicarbonate 5.0 (L) 20.0 - 28.0 mmol/L   TCO2 5 0 - 100 mmol/L   O2 Saturation 63.0 %   Acid-base deficit 22.0 (H) 0.0 - 2.0 mmol/L   Patient temperature HIDE    Sample type VENOUS    Comment NOTIFIED PHYSICIAN   I-Stat CG4 Lactic Acid, ED  (not at  [facility])     Status: Abnormal   Collection Time: 01/14/16 10:13 PM  Result Value Ref Range   Lactic Acid, Venous 5.44 (HH) 0.5 - 1.9 mmol/L   Comment NOTIFIED PHYSICIAN   Blood gas, arterial     Status: Abnormal   Collection Time: 01/14/16 10:26 PM  Result Value Ref Range   FIO2 21.00    Delivery systems ROOM AIR    pH, Arterial 7.130 (LL) 7.350 - 7.450    Comment: CRITICAL RESULT CALLED TO, READ BACK BY AND VERIFIED WITH: VICTOR TAYLOR RRT,AT 2235 BY MIRIAM TURRIFF RRT ON 01/14/2016    pCO2 arterial BELOW REPORTABLE RANGE 32.0 - 48.0 mmHg   pO2, Arterial 124 (H) 83.0 - 108.0 mmHg   Bicarbonate 3.3 (L) 20.0 - 28.0 mmol/L   Acid-base deficit 24.9 (H) 0.0 - 2.0 mmol/L   O2 Saturation 95.6 %   Patient temperature 98.6    Collection site BRACHIAL ARTERY    Drawn by [id number]    Sample type ARTERIAL DRAW    Allens test (pass/fail) PASS PASS  CBG monitoring, ED     Status: Abnormal   Collection Time: 01/14/16 11:00 PM  Result Value Ref Range   Glucose-Capillary >600 (HH) 65 - 99 mg/dL  CBG monitoring, ED     Status: Abnormal   Collection Time: 01/15/16 12:07 AM  Result Value Ref Range   Glucose-Capillary >600 (HH) 65 - 99 mg/dL  CBG monitoring, ED     Status: Abnormal   Collection Time: 01/15/16  1:10 AM  Result Value Ref Range   Glucose-Capillary >600 (HH) 65 - 99 mg/dL  I-Stat CG4 Lactic Acid, ED  (not at  [facility])     Status: Abnormal   Collection Time: 01/15/16  1:11 AM  Result Value Ref Range   Lactic Acid, Venous 3.90 (HH) 0.5 - 1.9 mmol/L   Comment NOTIFIED PHYSICIAN   MRSA PCR Screening     Status: None   Collection Time: 01/15/16  1:43 AM  Result Value Ref Range   MRSA by PCR NEGATIVE NEGATIVE    Comment:        The  GeneXpert MRSA Assay (FDA approved for NASAL specimens only), is one component of a comprehensive MRSA colonization surveillance program. It is not intended to diagnose MRSA infection nor to guide or monitor treatment for MRSA infections.   Glucose, capillary     Status: Abnormal   Collection Time: 01/15/16  2:08 AM  Result Value Ref Range   Glucose-Capillary >600 (HH) 65 - 99 mg/dL   Comment 1 Glucose Stabilizer   Beta-hydroxybutyric acid     Status: Abnormal   Collection Time: 01/15/16  2:16 AM  Result Value Ref Range   Beta-Hydroxybutyric Acid >8.00 (H) 0.05 - 0.27 mmol/L    Comment: RESULTS CONFIRMED BY MANUAL DILUTION  Bilirubin, direct     Status: Abnormal   Collection Time:  01/15/16  2:16 AM  Result Value Ref Range   Bilirubin, Direct 1.0 (H) 0.1 - 0.5 mg/dL  Basic metabolic panel     Status: Abnormal   Collection Time: 01/15/16  2:16 AM  Result Value Ref Range   Sodium 121 (L) 135 - 145 mmol/L    Comment: DELTA CHECK NOTED   Potassium 6.7 (HH) 3.5 - 5.1 mmol/L    Comment: CRITICAL RESULT CALLED TO, READ BACK BY AND VERIFIED WITH: HASSAN Z,RN 01/15/16 0350 WAYK    Chloride 87 (L) 101 - 111 mmol/L   CO2 7 (L) 22 - 32 mmol/L   Glucose, Bld 862 (HH) 65 - 99 mg/dL    Comment: CRITICAL RESULT CALLED TO, READ BACK BY AND VERIFIED WITH: HASSAN Z,RN 01/15/16 0350 WAYK    BUN 83 (H) 6 - 20 mg/dL   Creatinine, Ser 3.79 (H) 0.61 - 1.24 mg/dL    Comment: DELTA CHECK NOTED   Calcium 7.4 (L) 8.9 - 10.3 mg/dL   GFR calc non Af Amer 17 (L) >60 mL/min   GFR calc Af Amer 20 (L) >60 mL/min    Comment: (NOTE) The eGFR has been calculated using the CKD EPI equation. This calculation has not been validated in all clinical situations. eGFR's persistently <60 mL/min signify possible Chronic Kidney Disease.    Anion gap 27 (H) 5 - 15  Lipase, blood     Status: None   Collection Time: 01/15/16  2:16 AM  Result Value Ref Range   Lipase 18 11 - 51 U/L  Glucose, capillary      Status: Abnormal   Collection Time: 01/15/16  3:13 AM  Result Value Ref Range   Glucose-Capillary >600 (HH) 65 - 99 mg/dL   Comment 1 Glucose Stabilizer   Glucose, capillary     Status: Abnormal   Collection Time: 01/15/16  4:16 AM  Result Value Ref Range   Glucose-Capillary >600 (HH) 65 - 99 mg/dL   Comment 1 Glucose Stabilizer   Basic metabolic panel     Status: Abnormal   Collection Time: 01/15/16  4:48 AM  Result Value Ref Range   Sodium 125 (L) 135 - 145 mmol/L   Potassium 6.3 (HH) 3.5 - 5.1 mmol/L    Comment: CRITICAL RESULT CALLED TO, READ BACK BY AND VERIFIED WITH: HAYES C,RN 01/15/16 0555 WAYK    Chloride 92 (L) 101 - 111 mmol/L   CO2 11 (L) 22 - 32 mmol/L   Glucose, Bld 770 (HH) 65 - 99 mg/dL    Comment: CRITICAL RESULT CALLED TO, READ BACK BY AND VERIFIED WITH: HAYES C,RN 01/15/16 0555 WAYK    BUN 83 (H) 6 - 20 mg/dL   Creatinine, Ser 3.42 (H) 0.61 - 1.24 mg/dL   Calcium 7.6 (L) 8.9 - 10.3 mg/dL   GFR calc non Af Amer 20 (L) >60 mL/min   GFR calc Af Amer 23 (L) >60 mL/min    Comment: (NOTE) The eGFR has been calculated using the CKD EPI equation. This calculation has not been validated in all clinical situations. eGFR's persistently <60 mL/min signify possible Chronic Kidney Disease.    Anion gap 22 (H) 5 - 15  Magnesium     Status: Abnormal   Collection Time: 01/15/16  4:48 AM  Result Value Ref Range   Magnesium 2.9 (H) 1.7 - 2.4 mg/dL  Phosphorus     Status: None   Collection Time: 01/15/16  4:48 AM  Result Value Ref Range   Phosphorus 3.9 2.5 -  4.6 mg/dL  CBC     Status: Abnormal   Collection Time: 01/15/16  4:48 AM  Result Value Ref Range   WBC 28.4 (H) 4.0 - 10.5 K/uL   RBC 3.51 (L) 4.22 - 5.81 MIL/uL   Hemoglobin 9.7 (L) 13.0 - 17.0 g/dL   HCT 28.7 (L) 39.0 - 52.0 %   MCV 81.8 78.0 - 100.0 fL    Comment: DELTA CHECK NOTED REPEATED TO VERIFY    MCH 27.6 26.0 - 34.0 pg   MCHC 33.8 30.0 - 36.0 g/dL   RDW 12.6 11.5 - 15.5 %   Platelets 348 150 -  400 K/uL  Glucose, capillary     Status: Abnormal   Collection Time: 01/15/16  5:19 AM  Result Value Ref Range   Glucose-Capillary >600 (HH) 65 - 99 mg/dL   Comment 1 Glucose Stabilizer   Glucose, capillary     Status: Abnormal   Collection Time: 01/15/16  6:16 AM  Result Value Ref Range   Glucose-Capillary >600 (HH) 65 - 99 mg/dL   Comment 1 Glucose Stabilizer   Glucose, capillary     Status: Abnormal   Collection Time: 01/15/16  7:24 AM  Result Value Ref Range   Glucose-Capillary 521 (HH) 65 - 99 mg/dL   Comment 1 Glucose Stabilizer   Glucose, capillary     Status: Abnormal   Collection Time: 01/15/16  8:21 AM  Result Value Ref Range   Glucose-Capillary 561 (HH) 65 - 99 mg/dL   Comment 1 Document in Chart   Basic metabolic panel     Status: Abnormal   Collection Time: 01/15/16  9:14 AM  Result Value Ref Range   Sodium 130 (L) 135 - 145 mmol/L   Potassium 5.1 3.5 - 5.1 mmol/L    Comment: DELTA CHECK NOTED   Chloride 99 (L) 101 - 111 mmol/L   CO2 20 (L) 22 - 32 mmol/L   Glucose, Bld 481 (H) 65 - 99 mg/dL   BUN 74 (H) 6 - 20 mg/dL   Creatinine, Ser 2.56 (H) 0.61 - 1.24 mg/dL   Calcium 7.7 (L) 8.9 - 10.3 mg/dL   GFR calc non Af Amer 28 (L) >60 mL/min   GFR calc Af Amer 32 (L) >60 mL/min    Comment: (NOTE) The eGFR has been calculated using the CKD EPI equation. This calculation has not been validated in all clinical situations. eGFR's persistently <60 mL/min signify possible Chronic Kidney Disease.    Anion gap 11 5 - 15  CBC     Status: Abnormal   Collection Time: 01/15/16  9:14 AM  Result Value Ref Range   WBC 25.2 (H) 4.0 - 10.5 K/uL    Comment: CONSISTENT WITH PREVIOUS RESULT   RBC 3.33 (L) 4.22 - 5.81 MIL/uL   Hemoglobin 9.2 (L) 13.0 - 17.0 g/dL   HCT 26.2 (L) 39.0 - 52.0 %   MCV 78.7 78.0 - 100.0 fL   MCH 27.6 26.0 - 34.0 pg   MCHC 35.1 30.0 - 36.0 g/dL   RDW 12.0 11.5 - 15.5 %   Platelets 331 150 - 400 K/uL  Glucose, capillary     Status: Abnormal    Collection Time: 01/15/16  9:25 AM  Result Value Ref Range   Glucose-Capillary 445 (H) 65 - 99 mg/dL   Comment 1 Document in Chart   Glucose, capillary     Status: Abnormal   Collection Time: 01/15/16 10:08 AM  Result Value Ref Range   Glucose-Capillary  497 (H) 65 - 99 mg/dL  Glucose, capillary     Status: Abnormal   Collection Time: 01/15/16 11:15 AM  Result Value Ref Range   Glucose-Capillary 432 (H) 65 - 99 mg/dL   Comment 1 Document in Chart   Basic metabolic panel     Status: Abnormal   Collection Time: 01/15/16 11:23 AM  Result Value Ref Range   Sodium 132 (L) 135 - 145 mmol/L   Potassium 4.8 3.5 - 5.1 mmol/L   Chloride 103 101 - 111 mmol/L   CO2 24 22 - 32 mmol/L   Glucose, Bld 370 (H) 65 - 99 mg/dL   BUN 70 (H) 6 - 20 mg/dL   Creatinine, Ser 2.40 (H) 0.61 - 1.24 mg/dL   Calcium 7.7 (L) 8.9 - 10.3 mg/dL   GFR calc non Af Amer 30 (L) >60 mL/min   GFR calc Af Amer 35 (L) >60 mL/min    Comment: (NOTE) The eGFR has been calculated using the CKD EPI equation. This calculation has not been validated in all clinical situations. eGFR's persistently <60 mL/min signify possible Chronic Kidney Disease.    Anion gap 5 5 - 15  Glucose, capillary     Status: Abnormal   Collection Time: 01/15/16 12:06 PM  Result Value Ref Range   Glucose-Capillary 336 (H) 65 - 99 mg/dL   Comment 1 Document in Chart   Glucose, capillary     Status: Abnormal   Collection Time: 01/15/16 12:21 PM  Result Value Ref Range   Glucose-Capillary 343 (H) 65 - 99 mg/dL   Comment 1 Document in Chart   Glucose, capillary     Status: Abnormal   Collection Time: 01/15/16  1:06 PM  Result Value Ref Range   Glucose-Capillary 292 (H) 65 - 99 mg/dL   Comment 1 Document in Chart   Glucose, capillary     Status: Abnormal   Collection Time: 01/15/16  1:59 PM  Result Value Ref Range   Glucose-Capillary 300 (H) 65 - 99 mg/dL   Comment 1 Document in Chart   Glucose, capillary     Status: Abnormal   Collection  Time: 01/15/16  3:12 PM  Result Value Ref Range   Glucose-Capillary 256 (H) 65 - 99 mg/dL   Comment 1 Document in Chart     Mr Foot Right W Wo Contrast  Result Date: 01/15/2016 CLINICAL DATA:  Peripheral vascular disease, severe diabetic ketoacidosis, glucose values greater than 600 today. Extensive gas in the soft tissues of the right foot, fifth toe amputation sixteen days ago. EXAM: MRI OF THE RIGHT FOREFOOT WITHOUT AND WITH CONTRAST TECHNIQUE: Multiplanar, multisequence MR imaging of the right foot was performed before and after the administration of intravenous contrast. CONTRAST:  70m MULTIHANCE GADOBENATE DIMEGLUMINE 529 MG/ML IV SOLN COMPARISON:  01/15/2016 FINDINGS: Despite efforts by the technologist and patient, motion artifact is present on today's exam and could not be eliminated. This reduces exam sensitivity and specificity. Bones/Joint/Cartilage Abnormal scattered gas signal intensities tracking proximally in the first, second, third, fourth, and residual fifth metatarsal and also in the cuneiform spur. Abnormal osseous edema in the fourth metatarsal distally and in the cuboid. There is only subtle accentuated enhancement distally in the fourth metatarsal in the region of the edema. Suspected enhancement in the cuboid and in patchy regions of the distal first metatarsal. Ligaments Lisfranc ligament obscured Muscles and Tendons Extensive abnormal gas density tracking along the musculotendinous structures of the forefoot. Regional muscular enhancement compatible with myositis. Soft  tissues Extensive subcutaneous edema and enhancement compatible with cellulitis. A focal well-defined abscess is not well seen. IMPRESSION: 1. Extensive abnormal gas in the soft tissues and bony structures of the right forefoot and distal midfoot, favoring gas-forming infection. This can be fulminant and may require further amputation. These results were called by telephone at the time of interpretation on  01/15/2016 at 5:24 pm to Dr. Lorin Mercy, who verbally acknowledged these results. Electronically Signed   By: Van Clines M.D.   On: 01/15/2016 17:24   Dg Chest Portable 1 View  Result Date: 01/14/2016 CLINICAL DATA:  Altered mental status.  Hyperglycemia. EXAM: PORTABLE CHEST 1 VIEW COMPARISON:  None. FINDINGS: A single AP portable view of the chest demonstrates no focal airspace consolidation or alveolar edema. The lungs are grossly clear. There is no large effusion or pneumothorax. Cardiac and mediastinal contours appear unremarkable. IMPRESSION: No active disease. Electronically Signed   By: Andreas Newport M.D.   On: 01/14/2016 22:16   Dg Abd Portable 1v  Result Date: 01/15/2016 CLINICAL DATA:  Abdominal pain. EXAM: PORTABLE ABDOMEN - 1 VIEW COMPARISON:  None. FINDINGS: Bowel gas pattern is nonobstructive. No evidence of free peritoneal air. Nonspecific calcific densities just left of the spine in the mid abdomen. Remaining bones and soft tissues are within normal. IMPRESSION: Nonobstructive bowel gas pattern. Electronically Signed   By: Marin Olp M.D.   On: 01/15/2016 08:18   Dg Foot 2 Views Right  Result Date: 01/15/2016 CLINICAL DATA:  Right foot infection. Fifth toe amputation 12/30/2015. EXAM: RIGHT FOOT - 2 VIEW COMPARISON:  None. FINDINGS: Evidence of patient's recent transmetatarsal amputation of the fifth toe. There is subcutaneous air over the plantar and dorsal soft tissues of the midfoot and forefoot which may be due to patient's diffuse soft tissue infection. Evidence of small vessel atherosclerotic disease. No definite bone destruction to suggest osteomyelitis. IMPRESSION: Evidence of recent fifth toe transmetatarsal amputation. Moderate air within the dorsal and plantar soft tissues of the mid to forefoot suggesting gas-forming infection. Electronically Signed   By: Marin Olp M.D.   On: 01/15/2016 11:52   US Abdomen Limited Ruq  Result Date: 01/15/2016 CLINICAL  DATA:  Hyperbilirubinemia, type I diabetes mellitus, hypertension EXAM: US ABDOMEN LIMITED - RIGHT UPPER QUADRANT COMPARISON:  None FINDINGS: Gallbladder: Normally distended with dependent sludge. No gallbladder wall thickening, pericholecystic fluid, definite calculi, or sonographic Murphy sign. Common bile duct: Diameter: Normal caliber 2 mm diameter Liver: Mildly heterogeneous. Hepatopetal portal venous flow. No definite hepatic mass or nodularity. No RIGHT upper quadrant ascites. Small RIGHT pleural effusion incidentally noted. IMPRESSION: Small none dependent sludge within gallbladder. No evidence of cholelithiasis, cholecystitis or biliary dilatation. Small RIGHT pleural effusion. Electronically Signed   By: Lavonia Dana M.D.   On: 01/15/2016 09:56    Review of Systems  Constitutional: Positive for chills, fever and malaise/fatigue.  Musculoskeletal:       12/30/15 5th ray amp  Neurological: Negative.   Endo/Heme/Allergies:       IDDM with A1C of 10-13.5 noted in chart poor control  Psychiatric/Behavioral: Negative.    Blood pressure 134/61, pulse (!) 102, temperature 100 F (37.8 C), temperature source Oral, resp. rate (!) 37, height 6' (1.829 m), weight 172 lb 13.5 oz (78.4 kg), SpO2 100 %. Physical Exam  Constitutional:  Thin chonically ill appearance. In ICU septic  GI: Soft.  Musculoskeletal:  5th ray right foot edge necrosis.   Psychiatric:  Responsive, slow in discussed. 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 Will do guillotine amputation and return in several days for revision to normal BKA if there is no further gas accending. Discussed with pt , sister and wife . ( wife by phone) questions answered. proceed Marybelle Killings 01/15/2016, 5:37 PM

## 2016-01-15 NOTE — Brief Op Note (Signed)
01/14/2016 - 01/15/2016  7:20 PM  PATIENT:  Gordon Walker  50 y.o. male  PRE-OPERATIVE DIAGNOSIS:  gas gangrene  POST-OPERATIVE DIAGNOSIS:  gas gangrene  PROCEDURE:  Procedure(s): GUILLOTINE AMPUTATION  ABOVE THE ANKLE AMPUTATION (Right)  SURGEON:  Surgeon(s) and Role:    * Eldred MangesMark C Runette Scifres, MD - Primary  PHYSICIAN ASSISTANT:   ASSISTANTS: none   ANESTHESIA:   general  EBL:  Total I/O In: 250 [I.V.:250] Out: 50 [Blood:50]  BLOOD ADMINISTERED:none  DRAINS: none   LOCAL MEDICATIONS USED:  NONE  SPECIMEN:  No Specimen  DISPOSITION OF SPECIMEN:  N/A  COUNTS:  YES  TOURNIQUET:   Total Tourniquet Time Documented: Thigh (Right) - 5 minutes Total: Thigh (Right) - 5 minutes   DICTATION: .Dragon Dictation  PLAN OF CARE: ICU  PATIENT DISPOSITION:  PACU - hemodynamically stable.   Delay start of Pharmacological VTE agent (>24hrs) due to surgical blood loss or risk of bleeding: not applicable

## 2016-01-15 NOTE — ED Notes (Signed)
Size medium condom cath in place

## 2016-01-15 NOTE — Progress Notes (Signed)
D; Dr Ophelia CharterYates vs, CBG result notified , ordered decreased insuline IV 3 unit now, and done

## 2016-01-16 ENCOUNTER — Encounter (HOSPITAL_COMMUNITY): Payer: Self-pay | Admitting: Orthopaedic Surgery

## 2016-01-16 DIAGNOSIS — E0811 Diabetes mellitus due to underlying condition with ketoacidosis with coma: Secondary | ICD-10-CM

## 2016-01-16 DIAGNOSIS — A480 Gas gangrene: Secondary | ICD-10-CM

## 2016-01-16 DIAGNOSIS — G9340 Encephalopathy, unspecified: Secondary | ICD-10-CM

## 2016-01-16 LAB — PHOSPHORUS: PHOSPHORUS: 1.6 mg/dL — AB (ref 2.5–4.6)

## 2016-01-16 LAB — BASIC METABOLIC PANEL
ANION GAP: 6 (ref 5–15)
Anion gap: 7 (ref 5–15)
Anion gap: 4 — ABNORMAL LOW (ref 5–15)
BUN: 34 mg/dL — ABNORMAL HIGH (ref 6–20)
BUN: 44 mg/dL — AB (ref 6–20)
BUN: 38 mg/dL — AB (ref 6–20)
CALCIUM: 7.5 mg/dL — AB (ref 8.9–10.3)
CHLORIDE: 111 mmol/L (ref 101–111)
CHLORIDE: 110 mmol/L (ref 101–111)
CO2: 24 mmol/L (ref 22–32)
CO2: 26 mmol/L (ref 22–32)
CO2: 24 mmol/L (ref 22–32)
CREATININE: 1.56 mg/dL — AB (ref 0.61–1.24)
CREATININE: 1.53 mg/dL — AB (ref 0.61–1.24)
CREATININE: 1.41 mg/dL — AB (ref 0.61–1.24)
Calcium: 7.8 mg/dL — ABNORMAL LOW (ref 8.9–10.3)
Calcium: 7.6 mg/dL — ABNORMAL LOW (ref 8.9–10.3)
Chloride: 109 mmol/L (ref 101–111)
GFR calc non Af Amer: 52 mL/min — ABNORMAL LOW (ref >60)
GFR, EST AFRICAN AMERICAN: 59 mL/min — AB (ref >60)
GFR, EST NON AFRICAN AMERICAN: 51 mL/min — AB (ref >60)
GFR, EST NON AFRICAN AMERICAN: 58 mL/min — AB (ref >60)
Glucose, Bld: 167 mg/dL — ABNORMAL HIGH (ref 65–99)
Glucose, Bld: 134 mg/dL — ABNORMAL HIGH (ref 65–99)
Glucose, Bld: 181 mg/dL — ABNORMAL HIGH (ref 65–99)
POTASSIUM: 4.1 mmol/L (ref 3.5–5.1)
POTASSIUM: 3.9 mmol/L (ref 3.5–5.1)
Potassium: 4.2 mmol/L (ref 3.5–5.1)
SODIUM: 142 mmol/L (ref 135–145)
SODIUM: 140 mmol/L (ref 135–145)
SODIUM: 139 mmol/L (ref 135–145)

## 2016-01-16 LAB — CBC
HCT: 24.3 % — ABNORMAL LOW (ref 39.0–52.0)
HCT: 23.3 % — ABNORMAL LOW (ref 39.0–52.0)
HEMOGLOBIN: 8.2 g/dL — AB (ref 13.0–17.0)
Hemoglobin: 8.4 g/dL — ABNORMAL LOW (ref 13.0–17.0)
MCH: 26.9 pg (ref 26.0–34.0)
MCH: 27.6 pg (ref 26.0–34.0)
MCHC: 34.6 g/dL (ref 30.0–36.0)
MCHC: 35.2 g/dL (ref 30.0–36.0)
MCV: 77.9 fL — ABNORMAL LOW (ref 78.0–100.0)
MCV: 78.5 fL (ref 78.0–100.0)
PLATELETS: 294 10*3/uL (ref 150–400)
PLATELETS: 277 10*3/uL (ref 150–400)
RBC: 3.12 MIL/uL — ABNORMAL LOW (ref 4.22–5.81)
RBC: 2.97 MIL/uL — AB (ref 4.22–5.81)
RDW: 12.1 % (ref 11.5–15.5)
RDW: 12.1 % (ref 11.5–15.5)
WBC: 21.3 10*3/uL — ABNORMAL HIGH (ref 4.0–10.5)
WBC: 19.4 10*3/uL — AB (ref 4.0–10.5)

## 2016-01-16 LAB — GLUCOSE, CAPILLARY
GLUCOSE-CAPILLARY: 156 mg/dL — AB (ref 65–99)
GLUCOSE-CAPILLARY: 121 mg/dL — AB (ref 65–99)
GLUCOSE-CAPILLARY: 146 mg/dL — AB (ref 65–99)
GLUCOSE-CAPILLARY: 178 mg/dL — AB (ref 65–99)
GLUCOSE-CAPILLARY: 157 mg/dL — AB (ref 65–99)
Glucose-Capillary: 120 mg/dL — ABNORMAL HIGH (ref 65–99)
Glucose-Capillary: 144 mg/dL — ABNORMAL HIGH (ref 65–99)
Glucose-Capillary: 148 mg/dL — ABNORMAL HIGH (ref 65–99)
Glucose-Capillary: 160 mg/dL — ABNORMAL HIGH (ref 65–99)
Glucose-Capillary: 178 mg/dL — ABNORMAL HIGH (ref 65–99)
Glucose-Capillary: 184 mg/dL — ABNORMAL HIGH (ref 65–99)
Glucose-Capillary: 222 mg/dL — ABNORMAL HIGH (ref 65–99)

## 2016-01-16 LAB — URINE CULTURE: Culture: NO GROWTH

## 2016-01-16 LAB — MAGNESIUM: MAGNESIUM: 2.5 mg/dL — AB (ref 1.7–2.4)

## 2016-01-16 MED ORDER — INSULIN GLARGINE 100 UNIT/ML ~~LOC~~ SOLN
40.0000 [IU] | Freq: Every day | SUBCUTANEOUS | Status: DC
  Administered 2016-01-16 – 2016-01-18 (×3): 40 [IU] via SUBCUTANEOUS
  Filled 2016-01-16 (×3): qty 0.4

## 2016-01-16 MED ORDER — POTASSIUM PHOSPHATES 15 MMOLE/5ML IV SOLN
24.0000 mmol | Freq: Once | INTRAVENOUS | Status: AC
  Administered 2016-01-16: 24 mmol via INTRAVENOUS
  Filled 2016-01-16: qty 8

## 2016-01-16 MED ORDER — VANCOMYCIN HCL IN DEXTROSE 1-5 GM/200ML-% IV SOLN
1000.0000 mg | Freq: Two times a day (BID) | INTRAVENOUS | Status: DC
  Administered 2016-01-16 – 2016-01-18 (×4): 1000 mg via INTRAVENOUS
  Filled 2016-01-16 (×5): qty 200

## 2016-01-16 MED ORDER — INSULIN ASPART 100 UNIT/ML ~~LOC~~ SOLN
0.0000 [IU] | SUBCUTANEOUS | Status: DC
  Administered 2016-01-16 (×2): 3 [IU] via SUBCUTANEOUS
  Administered 2016-01-16: 5 [IU] via SUBCUTANEOUS
  Administered 2016-01-16 – 2016-01-17 (×2): 3 [IU] via SUBCUTANEOUS
  Administered 2016-01-17: 5 [IU] via SUBCUTANEOUS
  Administered 2016-01-17: 3 [IU] via SUBCUTANEOUS

## 2016-01-16 NOTE — Progress Notes (Signed)
eLink Physician Progress Note and Electrolyte Replacement  Patient Name: Charletta CousinFrederick Aldana DOB: 1965/06/28 MRN: [id number]  Date of Service  01/16/2016   HPI/Events of Note    Recent Labs Lab 01/15/16 0448 01/15/16 0914 01/15/16 1123 01/15/16 2008 01/16/16 0037 01/16/16 0431  NA 125* 130* 132* 133* 142 140  K 6.3* 5.1 4.8 4.1 4.1 3.9  CL 92* 99* 103 106 111 110  CO2 11* 20* 24 20* 24 26  GLUCOSE 770* 481* 370* 185* 167* 134*  BUN 83* 74* 70* 51* 44* 38*  CREATININE 3.42* 2.56* 2.40* 1.74* 1.56* 1.53*  CALCIUM 7.6* 7.7* 7.7* 7.4* 7.8* 7.6*  MG 2.9*  --   --   --   --  2.5*  PHOS 3.9  --   --   --   --  1.6*    Estimated Creatinine Clearance: 64.8 mL/min (by C-G formula based on SCr of 1.53 mg/dL (H)).  Intake/Output      12/24 0701 - 12/25 0700   I.V. (mL/kg) 3892 (49.5)   IV Piggyback 150   Total Intake(mL/kg) 4042 (51.4)   Urine (mL/kg/hr) 3025 (1.6)   Blood 50 (0)   Total Output 3075   Net +967        - I/O DETAILED x 24h    Total I/O In: 905.5 [I.V.:855.5; IV Piggyback:50] Out: 775 [Urine:725; Blood:50] - I/O THIS SHIFT    ASSESSMENT Low phos  eICURN Interventions  K phos 24mmol   ASSESSMENT: MAJOR ELECTROLYTE      Dr. Kalman ShanMurali Zaiden Ludlum, M.D., [facility] CenterF.C.C.P Pulmonary and Critical Care Medicine Staff Physician  System [place] [facility] Pager: [phone], If no answer or between  15:00h - 7:00h: call [phone]  01/16/2016 6:09 AM

## 2016-01-16 NOTE — Progress Notes (Signed)
Pharmacy Antibiotic Note  Gordon CousinFrederick Walker is a 50 y.o. male admitted on 01/14/2016 with sepsis.  Pharmacy has been consulted for Vancomycin/Zosyn dosing. Pt with recent amputation. Rapid blood cx ID shows E Coli. Currently afebrile (Tmax 100.7), WBC 19.4.   Plan: Inc vancomycin to 1000mg  q12h due to improvement in renal function Continue Zosyn Monitor renal function, clinical course Consider de-escalation of vancomycin    Height: 6' (182.9 cm) Weight: 173 lb 4.5 oz (78.6 kg) IBW/kg (Calculated) : 77.6  Temp (24hrs), Avg:99.7 F (37.6 C), Min:98.6 F (37 C), Max:100.7 F (38.2 C)   Recent Labs Lab 01/14/16 2213 01/15/16 0111  01/15/16 0448 01/15/16 0914 01/15/16 1123 01/15/16 2008 01/16/16 0037 01/16/16 0431 01/16/16 0824 01/16/16 0825  WBC  --   --   --  28.4* 25.2*  --  24.8*  --  21.3*  --  19.4*  CREATININE  --   --   < > 3.42* 2.56* 2.40* 1.74* 1.56* 1.53* 1.41*  --   LATICACIDVEN 5.44* 3.90*  --   --   --   --   --   --   --   --   --   < > = values in this interval not displayed.  Estimated Creatinine Clearance: 70.3 mL/min (by C-G formula based on SCr of 1.41 mg/dL (H)).    No Known Allergies  Antimicrobials this admission: 12/24 Zosyn>> 12/24 vanc>>  Dose adjustments this admission: 12/25 Vanc 1250mg  q24h >> 1000mg  q12h  Microbiology results: 12/23 urine cx >> ng 12/23 blood cx x 2 >> Gram neg rods 12/23 BCID >> E Coli MRSA PCR neg  Thank you for allowing pharmacy to be a part of this patient's care.  Sandi CarneNick Meridith Romick, PharmD, BCPS Pharmacy Resident Pager: [phone] 01/16/2016 11:12 AM

## 2016-01-16 NOTE — Progress Notes (Signed)
PULMONARY / CRITICAL CARE MEDICINE   Name: Gordon Walker MRN: [id number] DOB: 1965-02-22    ADMISSION DATE:  01/14/2016 CONSULTATION DATE:  01/15/16  REFERRING MD:  Dr Drema PryPedro Cardama  CHIEF COMPLAINT:  Diabetic ketoacidosis  HISTORY OF PRESENT ILLNESS:   Gordon Walker is a 50 y.o. Walker with DM type 1, HTN and peripheral vasular disease who [facility].   Gordon Walker was recently operated on for osteomyelitis of his right 5th toe with partial amputation of the toe and his right foot in 12/30/15. He was given bactrim for antibiotic treatment around that time. For the past few days, he has been having nausea and dry heaving with associated dull epigastric pain that he cannot describe further today due to confusion that started earlier today per his wife. He has been more tired and having several watery loose stools as well. No fevers, chills, sweats reported. Due to extreme fatigue and increased work of breathing, his wife brought him to the ED today where he was found to be in DKA with AKI. He was started on Zosyn, Vancomycin, given 3 L of fluids and started on IV insulin therapy.   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 - denies pain More awake & alert Remains on insulin gtt   VITAL SIGNS: BP 136/78 (BP Location: Right Arm)   Pulse 85   Temp 99.7 F (37.6 C) (Oral)   Resp (!) 29   Ht 6' (1.829 m)   Wt 173 lb 4.5 oz (78.6 kg)   SpO2 100%   BMI 23.50 kg/m   PHYSICAL EXAMINATION: General:  Acutely ill Neuro:  Alert, interactive HEENT:  Dry mucous membranes, clear oropharynx Cardiovascular:  Tachycardic, no appreciable murmurs rubs or gallops Lungs:  CTAB, kussmaul breathing Abdomen:  Soft, tender to palpation over epigastrium with voluntary guarding at times Musculoskeletal:  S/p  amputations bilaterally Skin:  Rt foot wound -wrapped  LABS:  BMET  Recent Labs Lab 01/16/16 0037  01/16/16 0431 01/16/16 0824  NA 142 140 139  K 4.1 3.9 4.2  CL 111 110 109  CO2 24 26 24   BUN 44* 38* 34*  CREATININE 1.56* 1.53* 1.41*  GLUCOSE 167* 134* 181*    Electrolytes  Recent Labs Lab 01/15/16 0448  01/16/16 0037 01/16/16 0431 01/16/16 0824  CALCIUM 7.6*  < > 7.8* 7.6* 7.5*  MG 2.9*  --   --  2.5*  --   PHOS 3.9  --   --  1.6*  --   < > = values in this interval not displayed.  CBC  Recent Labs Lab 01/15/16 2008 01/16/16 0431 01/16/16 0825  WBC 24.8* 21.3* 19.4*  HGB 8.5* 8.4* 8.2*  HCT 24.0* 24.3* 23.3*  PLT 285 294 277    Coag's No results for input(s): APTT, INR in the last 168 hours.  Sepsis Markers  Recent Labs Lab 01/14/16 2213 01/15/16 0111  LATICACIDVEN 5.44* 3.90*    ABG  Recent Labs Lab 01/14/16 2226  PHART 7.130*  PCO2ART BELOW REPORTABLE RANGE  PO2ART 124*    Liver Enzymes  Recent Labs Lab 01/14/16 2143  AST 80*  ALT 67*  ALKPHOS 266*  BILITOT 3.0*  ALBUMIN 2.0*    Cardiac Enzymes No results for input(s): TROPONINI, PROBNP in the last 168 hours.  Glucose  Recent Labs Lab 01/16/16 0218 01/16/16 0315 01/16/16 0410 01/16/16 0510 01/16/16 0707 01/16/16 0819  GLUCAP 144* 148* 120* 121* 146*  178*    Imaging Gordon Foot Right W Wo Contrast  Result Date: 01/15/2016 CLINICAL DATA:  Peripheral vascular disease, severe diabetic ketoacidosis, glucose values greater than 600 today. Extensive gas in the soft tissues of the right foot, fifth toe amputation sixteen days ago. EXAM: MRI OF THE RIGHT FOREFOOT WITHOUT AND WITH CONTRAST TECHNIQUE: Multiplanar, multisequence Gordon imaging of the right foot was performed before and after the administration of intravenous contrast. CONTRAST:  7mL MULTIHANCE GADOBENATE DIMEGLUMINE 529 MG/ML IV SOLN COMPARISON:  01/15/2016 FINDINGS: Despite efforts by the technologist and patient, motion artifact is present on today's exam and could not be eliminated. This reduces exam sensitivity and  specificity. Bones/Joint/Cartilage Abnormal scattered gas signal intensities tracking proximally in the first, second, third, fourth, and residual fifth metatarsal and also in the cuneiform spur. Abnormal osseous edema in the fourth metatarsal distally and in the cuboid. There is only subtle accentuated enhancement distally in the fourth metatarsal in the region of the edema. Suspected enhancement in the cuboid and in patchy regions of the distal first metatarsal. Ligaments Lisfranc ligament obscured Muscles and Tendons Extensive abnormal gas density tracking along the musculotendinous structures of the forefoot. Regional muscular enhancement compatible with myositis. Soft tissues Extensive subcutaneous edema and enhancement compatible with cellulitis. A focal well-defined abscess is not well seen. IMPRESSION: 1. Extensive abnormal gas in the soft tissues and bony structures of the right forefoot and distal midfoot, favoring gas-forming infection. This can be fulminant and may require further amputation. These results were called by telephone at the time of interpretation on 01/15/2016 at 5:24 pm to Dr. Ophelia CharterYates, who verbally acknowledged these results. Electronically Signed   By: Gaylyn RongWalter  Liebkemann M.D.   On: 01/15/2016 17:24   Dg Foot 2 Views Right  Result Date: 01/15/2016 CLINICAL DATA:  Right foot infection. Fifth toe amputation 12/30/2015. EXAM: RIGHT FOOT - 2 VIEW COMPARISON:  None. FINDINGS: Evidence of patient's recent transmetatarsal amputation of the fifth toe. There is subcutaneous air over the plantar and dorsal soft tissues of the midfoot and forefoot which may be due to patient's diffuse soft tissue infection. Evidence of small vessel atherosclerotic disease. No definite bone destruction to suggest osteomyelitis. IMPRESSION: Evidence of recent fifth toe transmetatarsal amputation. Moderate air within the dorsal and plantar soft tissues of the mid to forefoot suggesting gas-forming infection.  Electronically Signed   By: Elberta Fortisaniel  Boyle M.D.   On: 01/15/2016 11:52   [place] Abdomen Limited Ruq  Result Date: 01/15/2016 CLINICAL DATA:  Hyperbilirubinemia, type I diabetes mellitus, hypertension EXAM: US ABDOMEN LIMITED - RIGHT UPPER QUADRANT COMPARISON:  None FINDINGS: Gallbladder: Normally distended with dependent sludge. No gallbladder wall thickening, pericholecystic fluid, definite calculi, or sonographic Murphy sign. Common bile duct: Diameter: Normal caliber 2 mm diameter Liver: Mildly heterogeneous. Hepatopetal portal venous flow. No definite hepatic mass or nodularity. No RIGHT upper quadrant ascites. Small RIGHT pleural effusion incidentally noted. IMPRESSION: Small none dependent sludge within gallbladder. No evidence of cholelithiasis, cholecystitis or biliary dilatation. Small RIGHT pleural effusion. Electronically Signed   By: Ulyses SouthwardMark  Boles M.D.   On: 01/15/2016 09:56   DISCUSSION: Gordon Walker is a 50 y.o. Walker with DM type 1, peripheral vascular disease s/p recent partial amputation of the right foot for chronic osteomyelitis who presents in DKA. Etiology unknown at this time though I suspect a sepsis syndrome with GI source. He has severe metabolic derangements including pseudohyponatremia, hyperkalemia, lactic acidosis and acute renal failure.   ASSESSMENT / PLAN:  ENDOCRINE A:   Diabetic  ketoacidosis confirmed with BHB P:    Give 40 U lantus & transition off insulin gtt in 24 Dc D5 drip Diab diet Mod SSI scale + meal coverage  RENAL A:   AKI - dehydration + bactrim - resolving Hyponatremia (likely pseudohyponatremia) Hyperkalemia - exacerbated by metabolic acidosis and AKI -resolved Mixed Lactic and ketotic metabolic acidosis- resolved HypophosP:    Replete lytes as needed  GASTROINTESTINAL A:   Nausea, vomiting Hyperbilirubinemia  RUQ ultrasound neg P:     INFECTIOUS A:   E coli sepsis  Source ? from RT foot - gas producing organism P:   FU blood  cultures Continue Zosyn and Vancomycin    NEUROLOGIC A:   Acute metabolioc encephalopathy - resolved P:   PT/ OT - for amputations    FAMILY  - Updates: Wife updated at bedside  - Inter-disciplinary family meet or Palliative Care meeting due by: NA   Transfer to floor & [facility] 12/26 cctime x 56m  Cyril Mourning MD. FCCP. [facility] Pager [phone] If no response call [phone]    01/16/2016, 9:36 AM

## 2016-01-17 DIAGNOSIS — A4151 Sepsis due to Escherichia coli [E. coli]: Secondary | ICD-10-CM

## 2016-01-17 DIAGNOSIS — N179 Acute kidney failure, unspecified: Secondary | ICD-10-CM | POA: Diagnosis present

## 2016-01-17 DIAGNOSIS — E871 Hypo-osmolality and hyponatremia: Secondary | ICD-10-CM | POA: Diagnosis present

## 2016-01-17 DIAGNOSIS — A419 Sepsis, unspecified organism: Secondary | ICD-10-CM | POA: Diagnosis present

## 2016-01-17 DIAGNOSIS — R7881 Bacteremia: Secondary | ICD-10-CM | POA: Diagnosis present

## 2016-01-17 DIAGNOSIS — B9620 Unspecified Escherichia coli [E. coli] as the cause of diseases classified elsewhere: Secondary | ICD-10-CM | POA: Diagnosis present

## 2016-01-17 LAB — BLOOD GAS, ARTERIAL
ACID-BASE DEFICIT: 24.9 mmol/L — AB (ref 0.0–2.0)
BICARBONATE: 3.3 mmol/L — AB (ref 20.0–28.0)
DRAWN BY: 345601
FIO2: 21
O2 Saturation: 95.6 %
PH ART: 7.13 — AB (ref 7.350–7.450)
PO2 ART: 124 mmHg — AB (ref 83.0–108.0)
Patient temperature: 98.6

## 2016-01-17 LAB — CBC
HCT: 24.1 % — ABNORMAL LOW (ref 39.0–52.0)
HEMOGLOBIN: 8.3 g/dL — AB (ref 13.0–17.0)
MCH: 27.5 pg (ref 26.0–34.0)
MCHC: 34.4 g/dL (ref 30.0–36.0)
MCV: 79.8 fL (ref 78.0–100.0)
PLATELETS: 276 10*3/uL (ref 150–400)
RBC: 3.02 MIL/uL — AB (ref 4.22–5.81)
RDW: 12.7 % (ref 11.5–15.5)
WBC: 21.3 10*3/uL — AB (ref 4.0–10.5)

## 2016-01-17 LAB — PHOSPHORUS: PHOSPHORUS: 1.9 mg/dL — AB (ref 2.5–4.6)

## 2016-01-17 LAB — MAGNESIUM: MAGNESIUM: 1.9 mg/dL (ref 1.7–2.4)

## 2016-01-17 LAB — BASIC METABOLIC PANEL
ANION GAP: 5 (ref 5–15)
BUN: 21 mg/dL — AB (ref 6–20)
CHLORIDE: 102 mmol/L (ref 101–111)
CO2: 26 mmol/L (ref 22–32)
Calcium: 7.6 mg/dL — ABNORMAL LOW (ref 8.9–10.3)
Creatinine, Ser: 1.09 mg/dL (ref 0.61–1.24)
Glucose, Bld: 209 mg/dL — ABNORMAL HIGH (ref 65–99)
POTASSIUM: 3.6 mmol/L (ref 3.5–5.1)
SODIUM: 133 mmol/L — AB (ref 135–145)

## 2016-01-17 LAB — GLUCOSE, CAPILLARY
GLUCOSE-CAPILLARY: 159 mg/dL — AB (ref 65–99)
GLUCOSE-CAPILLARY: 184 mg/dL — AB (ref 65–99)
GLUCOSE-CAPILLARY: 187 mg/dL — AB (ref 65–99)
GLUCOSE-CAPILLARY: 190 mg/dL — AB (ref 65–99)
GLUCOSE-CAPILLARY: 199 mg/dL — AB (ref 65–99)
Glucose-Capillary: 192 mg/dL — ABNORMAL HIGH (ref 65–99)

## 2016-01-17 LAB — PATHOLOGIST SMEAR REVIEW

## 2016-01-17 MED ORDER — INSULIN ASPART 100 UNIT/ML ~~LOC~~ SOLN
6.0000 [IU] | Freq: Three times a day (TID) | SUBCUTANEOUS | Status: DC
  Administered 2016-01-18: 6 [IU] via SUBCUTANEOUS

## 2016-01-17 MED ORDER — K PHOS MONO-SOD PHOS DI & MONO 155-852-130 MG PO TABS
500.0000 mg | ORAL_TABLET | Freq: Three times a day (TID) | ORAL | Status: DC
  Administered 2016-01-17 – 2016-01-18 (×2): 500 mg via ORAL
  Filled 2016-01-17 (×3): qty 2

## 2016-01-17 MED ORDER — POTASSIUM CHLORIDE CRYS ER 20 MEQ PO TBCR
40.0000 meq | EXTENDED_RELEASE_TABLET | Freq: Once | ORAL | Status: AC
  Administered 2016-01-17: 40 meq via ORAL
  Filled 2016-01-17: qty 2

## 2016-01-17 MED ORDER — INSULIN ASPART 100 UNIT/ML ~~LOC~~ SOLN
0.0000 [IU] | Freq: Three times a day (TID) | SUBCUTANEOUS | Status: DC
  Administered 2016-01-17 (×2): 3 [IU] via SUBCUTANEOUS
  Administered 2016-01-18: 5 [IU] via SUBCUTANEOUS
  Administered 2016-01-18: 3 [IU] via SUBCUTANEOUS

## 2016-01-17 MED ORDER — POTASSIUM PHOSPHATE MONOBASIC 500 MG PO TABS
500.0000 mg | ORAL_TABLET | Freq: Three times a day (TID) | ORAL | Status: DC
  Filled 2016-01-17 (×5): qty 1

## 2016-01-17 MED ORDER — K PHOS MONO-SOD PHOS DI & MONO 155-852-130 MG PO TABS
500.0000 mg | ORAL_TABLET | Freq: Three times a day (TID) | ORAL | Status: DC
  Filled 2016-01-17: qty 2

## 2016-01-17 NOTE — Progress Notes (Signed)
PROGRESS NOTE  Gordon Walker  ZOX:[id number] DOB: [birth year] DOA: 01/14/2016 PCP: Darrow BussingKOIRALA,DIBAS, MD Outpatient Specialists:  Subjective: Seen while he was eating his breakfast, denies any new complaints, denies any nausea vomiting.  Brief Narrative:  Gordon Walker who [facility] with [facility] today.   Gordon Walker was recently operated on for osteomyelitis of his right 5th toe with partial amputation of the toe and his right foot in 12/30/15. He was given bactrim for antibiotic treatment around that time. For the past few days, he has been having nausea and dry heaving with associated dull epigastric pain that he cannot describe further today due to confusion that started earlier today per his wife. He has been more tired and having several watery loose stools as well. No fevers, chills, sweats reported. Due to extreme fatigue and increased work of breathing, his wife brought him to the ED today where he was found to be in DKA with AKI. He was started on Zosyn, Vancomycin, given 3 L of fluids and started on IV insulin therapy.   Assessment & Plan:   Principal Problem:   Sepsis (HCC) Active Problems:   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, with long-term current use of insulin (HCC)   DKA (diabetic ketoacidoses) (HCC)   Gas gangrene of foot (HCC)   Hyponatremia   AKI (acute kidney injury) (HCC)   Hyperbilirubinemia   Bacteremia due to Escherichia coli    Sepsis Patient presented with sepsis, fever of 100.7 and WBC of 21 and presence of bacteremia. -Patient treated with aggressive IV fluid hydration and broad-spectrum antibiotics. -Currently on antibiotics for his Escherichia coli bacteremia. -Currently on vancomycin and Zosyn, after revision of the amputation likely will need treatment for E-coli for total of 14 days.  Escherichia coli  bacteremia -1/2 culture shows Escherichia coli, unclear source? Secondary to wound infection, does not have UTI. -Had history of transaminitis on admission with normal ultrasound, check LFTs in a.m. -Ultrasound didn't show cholecystitis or any CBD dilatation to suggest cholangitis.  DKA -Presented with blood sugar of 1003, bicarbonate of <7 and urine showed ketones. -This is treated with intensive IV insulin therapy, acidosis resolved and gap is closed. -Blood sugar currently in the range of 150 to 200s.  Acute kidney injury -Baseline creatinine is 1 from June 2017 presented with creatinine of 3.5. -This is likely secondary to dehydration from sepsis and DKA, this is resolved with aggressive IV fluid hydration.  Hyponatremia -Sodium was115 and glucose is 1003, corrected sodium is 137. This is pseudohyponatremia secondary to hyperglycemia.  Gas gangrene of the right foot -Had amputation on 12/8 and revision on 12/24 with above the ankle guillotine amputation. -Likely to go back to the OR for closure of the wound. -Currently on Zosyn and vancomycin.  Hyperbilirubinemia -Unclear etiology could be secondary to sepsis, ultrasound was normal. -Repeat LFTs in a.m. if total bilirubin is going up will repeat imaging.   DVT prophylaxis:  Code Status: Full Code Family Communication:  Disposition Plan:  Diet: Diet NPO time specified  Consultants:   None  Procedures:   None  Antimicrobials:   None   Objective: Vitals:   01/16/16 2353 01/17/16 0404 01/17/16 0820 01/17/16 1126  BP: (!) 152/79 (!) 146/82 (!) 146/81 140/74  Pulse: 86 81 84 87  Resp: 16 18 17 17   Temp: 98.6 F (37 C) 98.5 F (36.9 C)  98.2 F (36.8 C) 98.9 F (37.2 C)  TempSrc: Oral Oral Oral Oral  SpO2: 99% 100% 100% 97%  Weight:      Height:        Intake/Output Summary (Last 24 hours) at 01/17/16 1144 Last data filed at 01/17/16 0710  Gross per 24 hour  Intake           274.25 ml  Output              2650 ml  Net         -2375.75 ml   Filed Weights   01/15/16 0200 01/16/16 0100 01/16/16 1342  Weight: 78.4 kg (172 lb 13.5 oz) 78.6 kg (173 lb 4.5 oz) 75.8 kg (167 lb)    Examination: General exam: Appears calm and comfortable  Respiratory system: Clear to auscultation. Respiratory effort normal. Cardiovascular system: S1 & S2 heard, RRR. No JVD, murmurs, rubs, gallops or clicks. No pedal edema. Gastrointestinal system: Abdomen is nondistended, soft and nontender. No organomegaly or masses felt. Normal bowel sounds heard. Central nervous system: Alert and oriented. No focal neurological deficits. Extremities: Symmetric 5 x 5 power. Skin: No rashes, lesions or ulcers Psychiatry: Judgement and insight appear normal. Mood & affect appropriate.   Data Reviewed: I have personally reviewed following labs and imaging studies  CBC:  Recent Labs Lab 01/14/16 2143  01/15/16 0914 01/15/16 2008 01/16/16 0431 01/16/16 0825 01/17/16 0349  WBC 38.7*  < > 25.2* 24.8* 21.3* 19.4* 21.3*  NEUTROABS 30.6*  --   --   --   --   --   --   HGB 9.7*  < > 9.2* 8.5* 8.4* 8.2* 8.3*  HCT 31.4*  < > 26.2* 24.0* 24.3* 23.3* 24.1*  MCV 90.8  < > 78.7 78.2 77.9* 78.5 79.8  PLT 408*  < > 331 285 294 277 276  < > = values in this interval not displayed. Basic Metabolic Panel:  Recent Labs Lab 01/15/16 0448  01/15/16 2008 01/16/16 0037 01/16/16 0431 01/16/16 0824 01/17/16 0349  NA 125*  < > 133* 142 140 139 133*  K 6.3*  < > 4.1 4.1 3.9 4.2 3.6  CL 92*  < > 106 111 110 109 102  CO2 11*  < > 20* 24 26 24 26   GLUCOSE 770*  < > 185* 167* 134* 181* 209*  BUN 83*  < > 51* 44* 38* 34* 21*  CREATININE 3.42*  < > 1.74* 1.56* 1.53* 1.41* 1.09  CALCIUM 7.6*  < > 7.4* 7.8* 7.6* 7.5* 7.6*  MG 2.9*  --   --   --  2.5*  --  1.9  PHOS 3.9  --   --   --  1.6*  --  1.9*  < > = values in this interval not displayed. GFR: Estimated Creatinine Clearance: 88.9 mL/min (by C-G formula based on SCr of 1.09  mg/dL). Liver Function Tests:  Recent Labs Lab 01/14/16 2143  AST 80*  ALT 67*  ALKPHOS 266*  BILITOT 3.0*  PROT 6.8  ALBUMIN 2.0*    Recent Labs Lab 01/15/16 0216  LIPASE 18   No results for input(s): AMMONIA in the last 168 hours. Coagulation Profile: No results for input(s): INR, PROTIME in the last 168 hours. Cardiac Enzymes: No results for input(s): CKTOTAL, CKMB, CKMBINDEX, TROPONINI in the last 168 hours. BNP (last 3 results) No results for input(s): PROBNP in the last 8760 hours. HbA1C: No results for input(s): HGBA1C in the last 72  hours. CBG:  Recent Labs Lab 01/16/16 2025 01/16/16 2351 01/17/16 0402 01/17/16 0818 01/17/16 1124  GLUCAP 222* 159* 184* 190* 199*   Lipid Profile: No results for input(s): CHOL, HDL, LDLCALC, TRIG, CHOLHDL, LDLDIRECT in the last 72 hours. Thyroid Function Tests: No results for input(s): TSH, T4TOTAL, FREET4, T3FREE, THYROIDAB in the last 72 hours. Anemia Panel: No results for input(s): VITAMINB12, FOLATE, FERRITIN, TIBC, IRON, RETICCTPCT in the last 72 hours. Urine analysis:    Component Value Date/Time   COLORURINE YELLOW 01/14/2016 2143   APPEARANCEUR HAZY (A) 01/14/2016 2143   LABSPEC 1.016 01/14/2016 2143   PHURINE 5.0 01/14/2016 2143   GLUCOSEU >=500 (A) 01/14/2016 2143   HGBUR SMALL (A) 01/14/2016 2143   BILIRUBINUR NEGATIVE 01/14/2016 2143   KETONESUR 20 (A) 01/14/2016 2143   PROTEINUR NEGATIVE 01/14/2016 2143   NITRITE NEGATIVE 01/14/2016 2143   LEUKOCYTESUR NEGATIVE 01/14/2016 2143   Sepsis Labs: @LABRCNTIP (procalcitonin:4,lacticidven:4)  ) Recent Results (from the past 240 hour(s))  Urine culture     Status: None   Collection Time: 01/14/16  9:44 PM  Result Value Ref Range Status   Specimen Description URINE, CLEAN CATCH  Final   Special Requests NONE  Final   Culture NO GROWTH  Final   Report Status 01/16/2016 FINAL  Final  Blood Culture (routine x 2)     Status: None (Preliminary result)    Collection Time: 01/14/16 10:07 PM  Result Value Ref Range Status   Specimen Description BLOOD RIGHT HAND  Final   Special Requests IN PEDIATRIC BOTTLE 1CC  Final   Culture  Setup Time   Final    GRAM NEGATIVE RODS IN PEDIATRIC BOTTLE CRITICAL RESULT CALLED TO, READ BACK BY AND VERIFIED WITH: M SHUDA,PHARMD AT 1828 01/15/16 BY L BENFIELD    Culture   Final    GRAM NEGATIVE RODS IDENTIFICATION AND SUSCEPTIBILITIES TO FOLLOW    Report Status PENDING  Incomplete  Blood Culture ID Panel (Reflexed)     Status: Abnormal   Collection Time: 01/14/16 10:07 PM  Result Value Ref Range Status   Enterococcus species NOT DETECTED NOT DETECTED Final   Listeria monocytogenes NOT DETECTED NOT DETECTED Final   Staphylococcus species NOT DETECTED NOT DETECTED Final   Staphylococcus aureus NOT DETECTED NOT DETECTED Final   Streptococcus species NOT DETECTED NOT DETECTED Final   Streptococcus agalactiae NOT DETECTED NOT DETECTED Final   Streptococcus pneumoniae NOT DETECTED NOT DETECTED Final   Streptococcus pyogenes NOT DETECTED NOT DETECTED Final   Acinetobacter baumannii NOT DETECTED NOT DETECTED Final   Enterobacteriaceae species DETECTED (A) NOT DETECTED Final    Comment: CRITICAL RESULT CALLED TO, READ BACK BY AND VERIFIED WITH: M SHUDA,PHARMD AT 1828 01/15/16 BY L BENFIELD    Enterobacter cloacae complex NOT DETECTED NOT DETECTED Final   Escherichia coli DETECTED (A) NOT DETECTED Final    Comment: CRITICAL RESULT CALLED TO, READ BACK BY AND VERIFIED WITH: M SHUDA,PHARMD AT 1828 01/15/16 BY L BENFIELD    Klebsiella oxytoca NOT DETECTED NOT DETECTED Final   Klebsiella pneumoniae NOT DETECTED NOT DETECTED Final   Proteus species NOT DETECTED NOT DETECTED Final   Serratia marcescens NOT DETECTED NOT DETECTED Final   Carbapenem resistance NOT DETECTED NOT DETECTED Final   Haemophilus influenzae NOT DETECTED NOT DETECTED Final   Neisseria meningitidis NOT DETECTED NOT DETECTED Final    Pseudomonas aeruginosa NOT DETECTED NOT DETECTED Final   Candida albicans NOT DETECTED NOT DETECTED Final   Candida glabrata NOT DETECTED NOT  DETECTED Final   Candida krusei NOT DETECTED NOT DETECTED Final   Candida parapsilosis NOT DETECTED NOT DETECTED Final   Candida tropicalis NOT DETECTED NOT DETECTED Final  Blood Culture (routine x 2)     Status: None (Preliminary result)   Collection Time: 01/14/16 10:47 PM  Result Value Ref Range Status   Specimen Description BLOOD RIGHT ANTECUBITAL  Final   Special Requests BOTTLES DRAWN AEROBIC AND ANAEROBIC 5CC EA  Final   Culture NO GROWTH 2 DAYS  Final   Report Status PENDING  Incomplete  MRSA PCR Screening     Status: None   Collection Time: 01/15/16  1:43 AM  Result Value Ref Range Status   MRSA by PCR NEGATIVE NEGATIVE Final    Comment:        The GeneXpert MRSA Assay (FDA approved for NASAL specimens only), is one component of a comprehensive MRSA colonization surveillance program. It is not intended to diagnose MRSA infection nor to guide or monitor treatment for MRSA infections.      Invalid input(s): PROCALCITONIN, LACTICACIDVEN   Radiology Studies: Gordon Foot Right W Wo Contrast  Result Date: 01/15/2016 CLINICAL DATA:  Peripheral vascular Walker, severe diabetic ketoacidosis, glucose values greater than 600 today. Extensive gas in the soft tissues of the right foot, fifth toe amputation sixteen days ago. EXAM: MRI OF THE RIGHT FOREFOOT WITHOUT AND WITH CONTRAST TECHNIQUE: Multiplanar, multisequence Gordon imaging of the right foot was performed before and after the administration of intravenous contrast. CONTRAST:  7mL MULTIHANCE GADOBENATE DIMEGLUMINE 529 MG/ML IV SOLN COMPARISON:  01/15/2016 FINDINGS: Despite efforts by the technologist and patient, motion artifact is present on today's exam and could not be eliminated. This reduces exam sensitivity and specificity. Bones/Joint/Cartilage Abnormal scattered gas signal intensities  tracking proximally in the first, second, third, fourth, and residual fifth metatarsal and also in the cuneiform spur. Abnormal osseous edema in the fourth metatarsal distally and in the cuboid. There is only subtle accentuated enhancement distally in the fourth metatarsal in the region of the edema. Suspected enhancement in the cuboid and in patchy regions of the distal first metatarsal. Ligaments Lisfranc ligament obscured Muscles and Tendons Extensive abnormal gas density tracking along the musculotendinous structures of the forefoot. Regional muscular enhancement compatible with myositis. Soft tissues Extensive subcutaneous edema and enhancement compatible with cellulitis. A focal well-defined abscess is not well seen. IMPRESSION: 1. Extensive abnormal gas in the soft tissues and bony structures of the right forefoot and distal midfoot, favoring gas-forming infection. This can be fulminant and may require further amputation. These results were called by telephone at the time of interpretation on 01/15/2016 at 5:24 pm to Dr. Ophelia Charter, who verbally acknowledged these results. Electronically Signed   By: Gaylyn Rong M.D.   On: 01/15/2016 17:24   Dg Foot 2 Views Right  Result Date: 01/15/2016 CLINICAL DATA:  Right foot infection. Fifth toe amputation 12/30/2015. EXAM: RIGHT FOOT - 2 VIEW COMPARISON:  None. FINDINGS: Evidence of patient's recent transmetatarsal amputation of the fifth toe. There is subcutaneous air over the plantar and dorsal soft tissues of the midfoot and forefoot which may be due to patient's diffuse soft tissue infection. Evidence of small vessel atherosclerotic Walker. No definite bone destruction to suggest osteomyelitis. IMPRESSION: Evidence of recent fifth toe transmetatarsal amputation. Moderate air within the dorsal and plantar soft tissues of the mid to forefoot suggesting gas-forming infection. Electronically Signed   By: Elberta Fortis M.D.   On: 01/15/2016 11:52  Scheduled Meds: . heparin  5,000 Units Subcutaneous Q8H  . insulin aspart  0-15 Units Subcutaneous TID WC  . insulin aspart  6 Units Subcutaneous TID WC  . insulin glargine  40 Units Subcutaneous Daily  . piperacillin-tazobactam (ZOSYN)  IV  3.375 g Intravenous Q8H  . vancomycin  1,000 mg Intravenous Q12H   Continuous Infusions:   LOS: 2 days    Time spent: 35 minutes    Franci Oshana A, MD [facility] Pager [phone]  If 7PM-7AM, please contact night-coverage [email or web address] Password [facility] 01/17/2016, 11:44 AM

## 2016-01-17 NOTE — Clinical Social Work Note (Signed)
Clinical Social Work Assessment  Patient Details  Name: Gordon Walker MRN: [id number] Date of Birth: Apr 12, 1965  Date of referral:  01/17/16               Reason for consult:  Facility Placement                Permission sought to share information with:  Facility Sport and exercise psychologist, Family Supports Permission granted to share information::  Yes, Verbal Permission Granted  Name::     Multimedia programmer::  SNFs  Relationship::  Spouse  Contact Information:     Housing/Transportation Living arrangements for the past 2 months:  Single Family Home Source of Information:  Patient, Spouse, Parent Patient Interpreter Needed:  None Criminal Activity/Legal Involvement Pertinent to Current Situation/Hospitalization:  No - Comment as needed Significant Relationships:  Spouse, Parents Lives with:  Spouse Do you feel safe going back to the place where you live?  No Need for family participation in patient care:  No (Coment)  Care giving concerns:  CSW received consult for possible SNF placement at time of discharge. CSW met with patient, patient's spouse, and mother at bedside regarding PT recommendation of SNF placement at time of discharge. Per patient's spouse, patient's spouse is currently unable to care for patient at their home given patient's current physical needs and fall risk. Patient and patient's spouse expressed understanding of PT recommendation and are agreeable to SNF placement at time of discharge. CSW to continue to follow and assist with discharge planning needs.   Social Worker assessment / plan:  CSW spoke with patient and patient's spouse concerning possibility of rehab at [facility] before returning home.  Employment status:  Retired Surveyor, minerals Care PT Recommendations:  [place] / Referral to community resources:  Gas  Patient/Family's Response to care:  Patient and patient's spouse recognize need for rehab  before returning home and are agreeable to a SNF in [place].   Patient/Family's Understanding of and Emotional Response to Diagnosis, Current Treatment, and Prognosis:  Patient/family is realistic regarding therapy needs and expressed being hopeful for SNF placement. Patient expressed understanding of CSW role and discharge process. No questions/concerns about plan or treatment.    Emotional Assessment Appearance:  Appears stated age Attitude/Demeanor/Rapport:  Other (Appropriate) Affect (typically observed):  Accepting, Adaptable, Appropriate Orientation:  Oriented to Self, Oriented to Place, Oriented to  Time, Oriented to Situation Alcohol / Substance use:  Not Applicable Psych involvement (Current and /or in the community):  No (Comment)  Discharge Needs  Concerns to be addressed:  Care Coordination Readmission within the last 30 days:  No Current discharge risk:  None Barriers to Discharge:  Continued Medical Work up   [employer], Jaconita 01/17/2016, 4:13 PM

## 2016-01-17 NOTE — Consult Note (Addendum)
WOC consult requested prior to ortho service involvement. Dr Ophelia CharterYates is  now following for assessment and plan of care and pt has been to surgery for an amputation according to the EMR. Please refer to their service for further questions. Please re-consult if further assistance is needed.  Thank-you,  Cammie Mcgeeawn Madalaine Portier MSN, RN, CWOCN, [place]CN-AP, CNS [phone]

## 2016-01-17 NOTE — Evaluation (Signed)
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  Has AKI, DKA, bacteremia and sepsis now with fever at his admission  Clinical Impression  Pt is managing beginning mobility with RW to chair and is not having any pain issues yet.  Left up in chair with alarm in place, has elevated legs and instructed in ROM to LE's for mobility.  Talked with nursing about his transition to allow them to help with all movement.  Will follow acutely to decrease stay in SNF.    Follow Up Recommendations SNF    Equipment Recommendations  None recommended by PT    Recommendations for Other Services       Precautions / Restrictions Precautions Precautions: Fall;Other (comment) (NWB on R leg) Required Braces or Orthoses: Other Brace/Splint (insert to L shoe for midfoot amputation) Restrictions Weight Bearing Restrictions: Yes RLE Weight Bearing: Non weight bearing      Mobility  Bed Mobility Overal bed mobility: Modified Independent             General bed mobility comments: using bed rail and bed head elevated  Transfers Overall transfer level: Needs assistance Equipment used: Rolling walker (2 wheeled);1 person hand held assist Transfers: Sit to/from UGI CorporationStand;Stand Pivot Transfers Sit to Stand: Mod assist Stand pivot transfers: Min assist;From elevated surface (pt moves fairly quickly due to fatigue on LLE and no shoe )       General transfer comment: would feel more secure if he had L shoe and insert  Ambulation/Gait Ambulation/Gait assistance: Min assist Ambulation Distance (Feet): 2 Feet Assistive device: Rolling walker (2 wheeled);1 person hand held assist Gait Pattern/deviations: Narrow base of support (pivot shift on LLE to chair)        Stairs            Wheelchair Mobility    Modified  Rankin (Stroke Patients Only)       Balance Overall balance assessment: Needs assistance Sitting-balance support: Feet supported;Bilateral upper extremity supported Sitting balance-Leahy Scale: Good     Standing balance support: Bilateral upper extremity supported Standing balance-Leahy Scale: Poor                               Pertinent Vitals/Pain Pain Assessment: No/denies pain    Home Living Family/patient expects to be discharged to:: Private residence Living Arrangements: Spouse/significant other Available Help at Discharge: Family;Available PRN/intermittently Type of Home: House Home Access: Stairs to enter Entrance Stairs-Rails: Right;Left;Can reach both Entrance Stairs-Number of Steps: 4 Home Layout: Two level Home Equipment: Walker - 2 wheels;Other (comment) (L foot shoe)      Prior Function Level of Independence: Independent with assistive device(s)               Hand Dominance        Extremity/Trunk Assessment   Upper Extremity Assessment Upper Extremity Assessment: Overall WFL for tasks assessed    Lower Extremity Assessment Lower Extremity Assessment: Generalized weakness    Cervical / Trunk Assessment Cervical / Trunk Assessment: Normal  Communication   Communication: No difficulties  Cognition Arousal/Alertness: Awake/alert Behavior During Therapy: WFL for tasks assessed/performed Overall Cognitive Status: Within Functional Limits for tasks assessed  General Comments General comments (skin integrity, edema, etc.): Pt is up to chair with min assist to pivot, but assistance to reach back to sit wiht mod assist    Exercises Other Exercises Other Exercises: quad sets, L ankle ROM, hip abd/add   Assessment/Plan    PT Assessment Patient needs continued PT services  PT Problem List Decreased strength;Decreased range of motion;Decreased activity tolerance;Decreased balance;Decreased  mobility;Decreased coordination;Decreased knowledge of use of DME;Decreased safety awareness;Cardiopulmonary status limiting activity;Decreased skin integrity          PT Treatment Interventions DME instruction;Gait training;Stair training;Functional mobility training;Therapeutic activities;Therapeutic exercise;Balance training;Neuromuscular re-education;Patient/family education    PT Goals (Current goals can be found in the Care Plan section)  Acute Rehab PT Goals Patient Stated Goal: to get up to walk and get home, fit a prosthesis PT Goal Formulation: With patient Time For Goal Achievement: 01/31/16 Potential to Achieve Goals: Good    Frequency Min 4X/week   Barriers to discharge Inaccessible home environment;Decreased caregiver support (wife working and has stairs all over his house)      Co-evaluation               End of Session Equipment Utilized During Treatment: Gait belt Activity Tolerance: Patient tolerated treatment well;Patient limited by fatigue;Patient limited by lethargy Patient left: in chair;with call bell/phone within reach;with chair alarm set Nurse Communication: Mobility status         Time: [id number] PT Time Calculation (min) (ACUTE ONLY): 39 min   Charges:   PT Evaluation $PT Eval Moderate Complexity: 1 Procedure PT Treatments $Therapeutic Exercise: 8-22 mins $Therapeutic Activity: 8-22 mins   PT G Codes:        Ivar DrapeStout, Hiroyuki Ozanich E 01/17/2016, 1:25 PM   Samul Dadauth Kodi Steil, PT MS Acute Rehab Dept. Number: [facility] [phone] and [facility] [phone]

## 2016-01-17 NOTE — Progress Notes (Signed)
Inpatient Diabetes Program Recommendations  AACE/ADA: New Consensus Statement on Inpatient Glycemic Control (2015)  Target Ranges:  Prepandial:   less than 140 mg/dL      Peak postprandial:   less than 180 mg/dL (1-2 hours)      Critically ill patients:  140 - 180 mg/dL   Results for Charletta CousinRANDALL, Griffon (MRN [id number]) as of 01/17/2016 08:46  Ref. Range 01/16/2016 10:11 01/16/2016 11:41 01/16/2016 16:22 01/16/2016 20:25 01/16/2016 23:51 01/17/2016 04:02 01/17/2016 08:18  Glucose-Capillary Latest Ref Range: 65 - 99 mg/dL 409178 (H) 811157 (H) 914184 (H) 222 (H) 159 (H) 184 (H) 190 (H)  Results for Charletta CousinRANDALL, Rexford (MRN [id number]) as of 01/17/2016 08:46  Ref. Range 01/14/2016 21:43  Glucose Latest Ref Range: 65 - 99 mg/dL 0,8651,003 (HH)   Review of Glycemic Control  Outpatient Diabetes medications: Lantus 27 units QHS, Novolog 9 units TID with meals Current orders for Inpatient glycemic control: Lantus 40 units daily, Novolog 0-15 units Q4H  Inpatient Diabetes Program Recommendations:  Correction (SSI): If patient is eating and tolerating diet, please consider changing frequency to ACHS. Insulin - Meal Coverage: If patient is eating and tolerating diet, please consider ordering Novolog 4 units TID with meals for meal coverage if patient eats at least 50% of meal.  NOTE: In reviewing the chart, noted patient is followed by Dr. Everardo AllEllison (Endocrinologist) and was last seen by Dr. Everardo AllEllison on 12/28/15 at which time A1C was 8.2%. Initial glucose was 1003 mg/dl and patient was on an insulin drip but has transitioned off IV insulin to SQ insulin.  Thanks, Orlando PennerMarie Aladdin Kollmann, RN, MSN, CDE Diabetes Coordinator Inpatient Diabetes Program [phone] (Team Pager from 8am to 5pm)

## 2016-01-17 NOTE — [place] FL2 (Signed)
[place] MEDICAID FL2 LEVEL OF CARE SCREENING TOOL     IDENTIFICATION  Patient Name: Gordon CousinFrederick Murcia Birthdate: 1965-06-29 Sex: male Admission Date (Current Location): 01/14/2016  [facility] [place]   Facility and Address:  [facility] [place], [address], [place], [place] [address]      Provider Number: [id number]  Attending Physician Name and Address:  Clydia LlanoMutaz Elmahi, MD  Relative Name and Phone Number:       Current Level of Care: Hospital Recommended Level of Care: Skilled Nursing Facility Prior Approval Number:    Date Approved/Denied:   PASRR Number: [id number]  Discharge Plan: SNF    Current Diagnoses: Patient Active Problem List   Diagnosis Date Noted  . Hyponatremia 01/17/2016  . AKI (acute kidney injury) (HCC) 01/17/2016  . Hyperbilirubinemia 01/17/2016  . Bacteremia due to Escherichia coli 01/17/2016  . Sepsis (HCC) 01/17/2016  . DKA (diabetic ketoacidoses) (HCC) 01/15/2016  . Gas gangrene of foot (HCC)   . Foot amputation status, right (HCC) 01/05/2016  . Ulcer of toe of right foot, with necrosis of bone (HCC) 12/27/2015  . Status post transmetatarsal amputation of foot, left (HCC) 11/24/2015  . Controlled diabetes mellitus with diabetic peripheral angiopathy and gangrene (HCC)   . 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 Transaminitis 01/07/2015  . Osteomyelitis of left foot (HCC) 01/06/2015  . Uncontrolled diabetes mellitus with foot ulcer, with long-term current use of insulin (HCC) 01/06/2015  . Anemia 01/06/2015  . Osteomyelitis (HCC) 01/05/2015    Orientation RESPIRATION BLADDER Height & Weight     Self, Time, Situation, Place  Normal Continent, Indwelling catheter Weight: 75.8 kg (167 lb) Height:  6' (182.9 cm)  BEHAVIORAL SYMPTOMS/MOOD NEUROLOGICAL BOWEL NUTRITION STATUS      Continent Diet (Please see DC Summary)   AMBULATORY STATUS COMMUNICATION OF NEEDS Skin   Limited Assist Verbally Surgical wounds (Closed incision on leg; wound on foot)                       Personal Care Assistance Level of Assistance  Bathing, Feeding, Dressing Bathing Assistance: Limited assistance Feeding assistance: Independent Dressing Assistance: Limited assistance     Functional Limitations Info             SPECIAL CARE FACTORS FREQUENCY  PT (By licensed PT)     PT Frequency: 5x/week              Contractures      Additional Factors Info  Code Status, Allergies, Insulin Sliding Scale Code Status Info: Full Allergies Info: NKA   Insulin Sliding Scale Info: 3 times daily with meals       Current Medications (01/17/2016):  This is the current hospital active medication list Current Facility-Administered Medications  Medication Dose Route Frequency Provider Last Rate Last Dose  . 0.9 %  sodium chloride infusion  250 mL Intravenous PRN Cornell BarmanArun Kannappan, MD      . heparin injection 5,000 Units  5,000 Units Subcutaneous Q8H Cornell BarmanArun Kannappan, MD   5,000 Units at 01/17/16 1340  . insulin aspart (novoLOG) injection 0-15 Units  0-15 Units Subcutaneous TID WC Clydia LlanoMutaz Elmahi, MD   3 Units at 01/17/16 1339  . insulin aspart (novoLOG) injection 6 Units  6 Units Subcutaneous TID WC Clydia LlanoMutaz Elmahi, MD   Stopped at 01/17/16 1337  . insulin glargine (LANTUS) injection 40 Units  40  Units Subcutaneous Daily Oretha Milchakesh V Alva, MD   40 Units at 01/17/16 [phone]  . piperacillin-tazobactam (ZOSYN) IVPB 3.375 g  3.375 g Intravenous Q8H Stevphen RochesterJames L Ledford, RPH   3.375 g at 01/17/16 1341  . potassium phosphate (monobasic) (K-PHOS ORIGINAL) tablet 500 mg  500 mg Oral TID WC & HS Clydia LlanoMutaz Elmahi, MD      . vancomycin (VANCOCIN) IVPB 1000 mg/200 mL premix  1,000 mg Intravenous Q12H Maryland PinkNicholas P Gazda, RPH   1,000 mg at 01/17/16 1341     Discharge Medications: Please see discharge summary for a list of discharge medications.  Relevant  Imaging Results:  Relevant Lab Results:   Additional Information SSN: [id number]  Ginnifer Creelman S CableRayyan, ConnecticutLCSWA

## 2016-01-17 NOTE — Care Management Note (Addendum)
Case Management Note  Patient Details  Name: Gordon Walker MRN: [id number] Date of Birth: Aug 08, 1965  Subjective/Objective:              Patient admitted from home with wife in septic shock. Scheduled for revision of amputation to foot/ ankle tomorrow. Patient will be B whole/partial foot/ankle amputee at DC. PT rec SNF. CM/ CSW to verify pt preference for DC.       Action/Plan:  CM/ CSW to follow for DC planning.  12/29 DC to CIR  Expected Discharge Date:                  Expected Discharge Plan:  Skilled Nursing Facility  In-House Referral:     Discharge planning Services  CM Consult  Post Acute Care Choice:    Choice offered to:     DME Arranged:    DME Agency:     HH Arranged:    HH Agency:     Status of Service:  In process, will continue to follow  If discussed at Long Length of Stay Meetings, dates discussed:    Additional Comments:  Lawerance SabalDebbie Kiegan Macaraeg, RN 01/17/2016, 3:36 PM

## 2016-01-17 NOTE — Progress Notes (Signed)
Pt. Lost IV site and IV zosyn got off schedule.  Called pharmacy and spoke to pharmacist, Olivia CanterAndy Marieme Mcmackin, who stated that pt. Needed zosyn and would be ok to hang the 2200 dose as soon as the current dose finish infusing.  Informed Vincenza HewsQuinn, the night RN and moved time to 2330.  Forbes Cellarelcine Solan Vosler, RN

## 2016-01-18 ENCOUNTER — Inpatient Hospital Stay (HOSPITAL_COMMUNITY): Payer: BLUE CROSS/BLUE SHIELD | Admitting: Anesthesiology

## 2016-01-18 ENCOUNTER — Encounter (HOSPITAL_COMMUNITY): Payer: Self-pay | Admitting: Certified Registered Nurse Anesthetist

## 2016-01-18 ENCOUNTER — Encounter (HOSPITAL_COMMUNITY)
Admission: EM | Disposition: A | Discharge status: Rehabilitation Facility | Payer: Self-pay | Source: Home / Self Care | Attending: Internal Medicine

## 2016-01-18 DIAGNOSIS — R7881 Bacteremia: Secondary | ICD-10-CM

## 2016-01-18 DIAGNOSIS — R740 Nonspecific elevation of levels of transaminase and lactic acid dehydrogenase [LDH]: Secondary | ICD-10-CM

## 2016-01-18 HISTORY — PX: AMPUTATION: SHX166

## 2016-01-18 LAB — CULTURE, BLOOD (ROUTINE X 2)

## 2016-01-18 LAB — GLUCOSE, CAPILLARY
Glucose-Capillary: 115 mg/dL — ABNORMAL HIGH (ref 65–99)
Glucose-Capillary: 144 mg/dL — ABNORMAL HIGH (ref 65–99)
Glucose-Capillary: 176 mg/dL — ABNORMAL HIGH (ref 65–99)
Glucose-Capillary: 187 mg/dL — ABNORMAL HIGH (ref 65–99)
Glucose-Capillary: 216 mg/dL — ABNORMAL HIGH (ref 65–99)
Glucose-Capillary: 80 mg/dL (ref 65–99)
Glucose-Capillary: 92 mg/dL (ref 65–99)

## 2016-01-18 LAB — COMPREHENSIVE METABOLIC PANEL
ALBUMIN: 1.4 g/dL — AB (ref 3.5–5.0)
ALT: 98 U/L — ABNORMAL HIGH (ref 17–63)
ANION GAP: 7 (ref 5–15)
AST: 188 U/L — ABNORMAL HIGH (ref 15–41)
Alkaline Phosphatase: 286 U/L — ABNORMAL HIGH (ref 38–126)
BILIRUBIN TOTAL: 1 mg/dL (ref 0.3–1.2)
BUN: 16 mg/dL (ref 6–20)
CO2: 27 mmol/L (ref 22–32)
Calcium: 7.6 mg/dL — ABNORMAL LOW (ref 8.9–10.3)
Chloride: 100 mmol/L — ABNORMAL LOW (ref 101–111)
Creatinine, Ser: 0.99 mg/dL (ref 0.61–1.24)
GLUCOSE: 200 mg/dL — AB (ref 65–99)
POTASSIUM: 3.5 mmol/L (ref 3.5–5.1)
Sodium: 134 mmol/L — ABNORMAL LOW (ref 135–145)
TOTAL PROTEIN: 5.6 g/dL — AB (ref 6.5–8.1)

## 2016-01-18 LAB — ABO/RH: ABO/RH(D): O POS

## 2016-01-18 LAB — SURGICAL PCR SCREEN
MRSA, PCR: INVALID — AB
Staphylococcus aureus: INVALID — AB

## 2016-01-18 LAB — CBC
HCT: 24.2 % — ABNORMAL LOW (ref 39.0–52.0)
Hemoglobin: 8.2 g/dL — ABNORMAL LOW (ref 13.0–17.0)
MCH: 27.2 pg (ref 26.0–34.0)
MCHC: 33.9 g/dL (ref 30.0–36.0)
MCV: 80.1 fL (ref 78.0–100.0)
Platelets: 288 K/uL (ref 150–400)
RBC: 3.02 MIL/uL — ABNORMAL LOW (ref 4.22–5.81)
RDW: 12.4 % (ref 11.5–15.5)
WBC: 18.4 K/uL — ABNORMAL HIGH (ref 4.0–10.5)

## 2016-01-18 LAB — PREPARE RBC (CROSSMATCH)

## 2016-01-18 LAB — PHOSPHORUS: Phosphorus: 2.7 mg/dL (ref 2.5–4.6)

## 2016-01-18 SURGERY — AMPUTATION BELOW KNEE
Anesthesia: General | Site: Leg Lower | Laterality: Right

## 2016-01-18 MED ORDER — NALOXONE HCL 0.4 MG/ML IJ SOLN
0.4000 mg | Freq: Once | INTRAMUSCULAR | Status: AC
  Administered 2016-01-18: 0.4 mg via INTRAVENOUS

## 2016-01-18 MED ORDER — HYDROMORPHONE HCL 1 MG/ML IJ SOLN
0.2500 mg | INTRAMUSCULAR | Status: DC | PRN
  Administered 2016-01-18 (×4): 0.5 mg via INTRAVENOUS

## 2016-01-18 MED ORDER — ONDANSETRON HCL 4 MG PO TABS: 4.0000 mg | ORAL_TABLET | Freq: Four times a day (QID) | ORAL | Status: DC | PRN

## 2016-01-18 MED ORDER — CEFAZOLIN SODIUM-DEXTROSE 2-3 GM-% IV SOLR
INTRAVENOUS | Status: DC | PRN
  Administered 2016-01-18: 2 g via INTRAVENOUS

## 2016-01-18 MED ORDER — FENTANYL CITRATE (PF) 100 MCG/2ML IJ SOLN
INTRAMUSCULAR | Status: AC
  Filled 2016-01-18: qty 4

## 2016-01-18 MED ORDER — INSULIN GLARGINE 100 UNIT/ML ~~LOC~~ SOLN
30.0000 [IU] | Freq: Every day | SUBCUTANEOUS | Status: DC
  Administered 2016-01-19 – 2016-01-20 (×2): 30 [IU] via SUBCUTANEOUS
  Filled 2016-01-18 (×2): qty 0.3

## 2016-01-18 MED ORDER — HYDROMORPHONE HCL 1 MG/ML IJ SOLN
INTRAMUSCULAR | Status: AC
  Filled 2016-01-18: qty 1

## 2016-01-18 MED ORDER — CEFAZOLIN SODIUM 1 G IJ SOLR
INTRAMUSCULAR | Status: AC
  Filled 2016-01-18: qty 20

## 2016-01-18 MED ORDER — METOCLOPRAMIDE HCL 10 MG PO TABS: 5.0000 mg | ORAL_TABLET | Freq: Three times a day (TID) | ORAL | Status: DC | PRN

## 2016-01-18 MED ORDER — ONDANSETRON HCL 4 MG/2ML IJ SOLN
INTRAMUSCULAR | Status: AC
  Filled 2016-01-18: qty 2

## 2016-01-18 MED ORDER — HYDROCODONE-ACETAMINOPHEN 7.5-325 MG PO TABS
1.0000 | ORAL_TABLET | ORAL | Status: DC | PRN
  Administered 2016-01-19 – 2016-01-20 (×6): 2 via ORAL
  Filled 2016-01-18 (×6): qty 2

## 2016-01-18 MED ORDER — LIDOCAINE 2% (20 MG/ML) 5 ML SYRINGE
INTRAMUSCULAR | Status: DC | PRN
  Administered 2016-01-18: 50 mg via INTRAVENOUS

## 2016-01-18 MED ORDER — SODIUM CHLORIDE 0.9 % IV SOLN
Freq: Once | INTRAVENOUS | Status: AC
  Administered 2016-01-18: 17:00:00 via INTRAVENOUS

## 2016-01-18 MED ORDER — MIDAZOLAM HCL 5 MG/5ML IJ SOLN
INTRAMUSCULAR | Status: DC | PRN
  Administered 2016-01-18: 2 mg via INTRAVENOUS

## 2016-01-18 MED ORDER — LORAZEPAM 2 MG/ML IJ SOLN
INTRAMUSCULAR | Status: AC
  Filled 2016-01-18: qty 1

## 2016-01-18 MED ORDER — SODIUM CHLORIDE 0.9 % IV SOLN: Freq: Once | INTRAVENOUS | Status: DC

## 2016-01-18 MED ORDER — INSULIN ASPART 100 UNIT/ML ~~LOC~~ SOLN
0.0000 [IU] | Freq: Every day | SUBCUTANEOUS | Status: DC
  Administered 2016-01-19: 2 [IU] via SUBCUTANEOUS

## 2016-01-18 MED ORDER — ENOXAPARIN SODIUM 30 MG/0.3ML ~~LOC~~ SOLN
30.0000 mg | SUBCUTANEOUS | Status: DC
  Administered 2016-01-19 – 2016-01-20 (×2): 30 mg via SUBCUTANEOUS
  Filled 2016-01-18 (×2): qty 0.3

## 2016-01-18 MED ORDER — ACETAMINOPHEN 325 MG PO TABS: 650.0000 mg | ORAL_TABLET | Freq: Four times a day (QID) | ORAL | Status: DC | PRN

## 2016-01-18 MED ORDER — 0.9 % SODIUM CHLORIDE (POUR BTL) OPTIME
TOPICAL | Status: DC | PRN
  Administered 2016-01-18: 1000 mL

## 2016-01-18 MED ORDER — CEFAZOLIN IN D5W 1 GM/50ML IV SOLN
1.0000 g | Freq: Three times a day (TID) | INTRAVENOUS | Status: DC
  Administered 2016-01-18 – 2016-01-20 (×7): 1 g via INTRAVENOUS
  Filled 2016-01-18 (×8): qty 50

## 2016-01-18 MED ORDER — ACETAMINOPHEN 650 MG RE SUPP: 650.0000 mg | Freq: Four times a day (QID) | RECTAL | Status: DC | PRN

## 2016-01-18 MED ORDER — LACTATED RINGERS IV SOLN
INTRAVENOUS | Status: DC
  Administered 2016-01-18 (×3): via INTRAVENOUS

## 2016-01-18 MED ORDER — CEFAZOLIN IN D5W 1 GM/50ML IV SOLN: 1.0000 g | Freq: Four times a day (QID) | INTRAVENOUS | Status: DC

## 2016-01-18 MED ORDER — FENTANYL CITRATE (PF) 100 MCG/2ML IJ SOLN
INTRAMUSCULAR | Status: DC | PRN
  Administered 2016-01-18 (×2): 25 ug via INTRAVENOUS
  Administered 2016-01-18 (×3): 50 ug via INTRAVENOUS

## 2016-01-18 MED ORDER — INSULIN ASPART 100 UNIT/ML ~~LOC~~ SOLN
0.0000 [IU] | Freq: Three times a day (TID) | SUBCUTANEOUS | Status: DC
  Administered 2016-01-19: 4 [IU] via SUBCUTANEOUS
  Administered 2016-01-19: 7 [IU] via SUBCUTANEOUS
  Administered 2016-01-19: 4 [IU] via SUBCUTANEOUS
  Administered 2016-01-20: 3 [IU] via SUBCUTANEOUS

## 2016-01-18 MED ORDER — METOCLOPRAMIDE HCL 5 MG/ML IJ SOLN: 5.0000 mg | Freq: Three times a day (TID) | INTRAMUSCULAR | Status: DC | PRN

## 2016-01-18 MED ORDER — LIDOCAINE 2% (20 MG/ML) 5 ML SYRINGE
INTRAMUSCULAR | Status: AC
  Filled 2016-01-18: qty 5

## 2016-01-18 MED ORDER — MIDAZOLAM HCL 2 MG/2ML IJ SOLN
INTRAMUSCULAR | Status: AC
  Filled 2016-01-18: qty 2

## 2016-01-18 MED ORDER — POTASSIUM CHLORIDE CRYS ER 20 MEQ PO TBCR
40.0000 meq | EXTENDED_RELEASE_TABLET | Freq: Once | ORAL | Status: AC
  Administered 2016-01-18: 40 meq via ORAL
  Filled 2016-01-18: qty 2

## 2016-01-18 MED ORDER — PROPOFOL 10 MG/ML IV BOLUS
INTRAVENOUS | Status: DC | PRN
  Administered 2016-01-18: 200 mg via INTRAVENOUS

## 2016-01-18 MED ORDER — HYDROMORPHONE HCL 2 MG/ML IJ SOLN
0.5000 mg | INTRAMUSCULAR | Status: DC | PRN
  Administered 2016-01-18 – 2016-01-19 (×3): 0.5 mg via INTRAVENOUS
  Filled 2016-01-18 (×4): qty 1

## 2016-01-18 MED ORDER — SODIUM CHLORIDE 0.45 % IV SOLN
INTRAVENOUS | Status: DC
  Administered 2016-01-18 – 2016-01-20 (×2): via INTRAVENOUS

## 2016-01-18 MED ORDER — ONDANSETRON HCL 4 MG/2ML IJ SOLN: 4.0000 mg | Freq: Four times a day (QID) | INTRAMUSCULAR | Status: DC | PRN

## 2016-01-18 MED ORDER — ONDANSETRON HCL 4 MG/2ML IJ SOLN
INTRAMUSCULAR | Status: DC | PRN
  Administered 2016-01-18: 4 mg via INTRAVENOUS

## 2016-01-18 MED ORDER — PROMETHAZINE HCL 25 MG/ML IJ SOLN: 6.2500 mg | INTRAMUSCULAR | Status: DC | PRN

## 2016-01-18 MED ORDER — NALOXONE HCL 0.4 MG/ML IJ SOLN
INTRAMUSCULAR | Status: AC
  Filled 2016-01-18: qty 1

## 2016-01-18 MED ORDER — INSULIN ASPART 100 UNIT/ML ~~LOC~~ SOLN
4.0000 [IU] | Freq: Three times a day (TID) | SUBCUTANEOUS | Status: DC
  Administered 2016-01-19 – 2016-01-20 (×5): 4 [IU] via SUBCUTANEOUS

## 2016-01-18 SURGICAL SUPPLY — 57 items
BANDAGE ACE 4X5 VEL STRL LF (GAUZE/BANDAGES/DRESSINGS) ×2 IMPLANT
BANDAGE ACE 6X5 VEL STRL LF (GAUZE/BANDAGES/DRESSINGS) ×2 IMPLANT
BANDAGE ESMARK 6X9 LF (GAUZE/BANDAGES/DRESSINGS) ×1 IMPLANT
BLADE SAW SAG 29X58X.64 (BLADE) ×2 IMPLANT
BNDG COHESIVE 6X5 TAN STRL LF (GAUZE/BANDAGES/DRESSINGS) IMPLANT
BNDG ESMARK 6X9 LF (GAUZE/BANDAGES/DRESSINGS) ×2
BNDG GAUZE ELAST 4 BULKY (GAUZE/BANDAGES/DRESSINGS) ×2 IMPLANT
CLEANER TIP ELECTROSURG 2X2 (MISCELLANEOUS) ×2 IMPLANT
COVER SURGICAL LIGHT HANDLE (MISCELLANEOUS) ×2 IMPLANT
CUFF TOURNIQUET SINGLE 34IN LL (TOURNIQUET CUFF) ×2 IMPLANT
CUFF TOURNIQUET SINGLE 44IN (TOURNIQUET CUFF) IMPLANT
DRAIN PENROSE 1/2X12 LTX STRL (WOUND CARE) IMPLANT
DRAPE HALF SHEET 40X57 (DRAPES) ×2 IMPLANT
DRAPE INCISE IOBAN 66X45 STRL (DRAPES) ×2 IMPLANT
DRAPE ORTHO SPLIT 77X108 STRL (DRAPES) ×1
DRAPE PROXIMA HALF (DRAPES) ×4 IMPLANT
DRAPE SURG ORHT 6 SPLT 77X108 (DRAPES) ×1 IMPLANT
DRAPE U-SHAPE 47X51 STRL (DRAPES) ×2 IMPLANT
DRSG PAD ABDOMINAL 8X10 ST (GAUZE/BANDAGES/DRESSINGS) ×2 IMPLANT
DURAPREP 26ML APPLICATOR (WOUND CARE) ×2 IMPLANT
EVACUATOR 1/8 PVC DRAIN (DRAIN) IMPLANT
GAUZE SPONGE 4X4 12PLY STRL (GAUZE/BANDAGES/DRESSINGS) ×2 IMPLANT
GAUZE XEROFORM 5X9 LF (GAUZE/BANDAGES/DRESSINGS) ×2 IMPLANT
GLOVE BIOGEL PI IND STRL 8 (GLOVE) ×2 IMPLANT
GLOVE BIOGEL PI INDICATOR 8 (GLOVE) ×2
GLOVE ORTHO TXT STRL SZ7.5 (GLOVE) ×4 IMPLANT
GOWN STRL REUS W/ TWL LRG LVL3 (GOWN DISPOSABLE) ×1 IMPLANT
GOWN STRL REUS W/ TWL XL LVL3 (GOWN DISPOSABLE) ×1 IMPLANT
GOWN STRL REUS W/TWL 2XL LVL3 (GOWN DISPOSABLE) ×2 IMPLANT
GOWN STRL REUS W/TWL LRG LVL3 (GOWN DISPOSABLE) ×1
GOWN STRL REUS W/TWL XL LVL3 (GOWN DISPOSABLE) ×1
IMMOBILIZER KNEE 20 (SOFTGOODS) ×2
IMMOBILIZER KNEE 20 THIGH 36 (SOFTGOODS) ×1 IMPLANT
KIT BASIN OR (CUSTOM PROCEDURE TRAY) ×2 IMPLANT
KIT ROOM TURNOVER OR (KITS) ×2 IMPLANT
MANIFOLD NEPTUNE II (INSTRUMENTS) ×2 IMPLANT
NEEDLE MAYO TROCAR (NEEDLE) IMPLANT
NS IRRIG 1000ML POUR BTL (IV SOLUTION) ×2 IMPLANT
PACK GENERAL/GYN (CUSTOM PROCEDURE TRAY) ×2 IMPLANT
PAD ARMBOARD 7.5X6 YLW CONV (MISCELLANEOUS) ×4 IMPLANT
PADDING CAST COTTON 6X4 STRL (CAST SUPPLIES) ×2 IMPLANT
SPONGE LAP 18X18 X RAY DECT (DISPOSABLE) IMPLANT
STAPLER VISISTAT 35W (STAPLE) ×2 IMPLANT
STOCKINETTE IMPERVIOUS LG (DRAPES) IMPLANT
SUT SILK 2 0 SH CR/8 (SUTURE) ×2 IMPLANT
SUT SILK 3 0 SH CR/8 (SUTURE) IMPLANT
SUT VIC AB 0 CT1 27 (SUTURE) ×1
SUT VIC AB 0 CT1 27XBRD ANBCTR (SUTURE) ×1 IMPLANT
SUT VIC AB 1 CT1 27 (SUTURE) ×1
SUT VIC AB 1 CT1 27XBRD ANBCTR (SUTURE) ×1 IMPLANT
SUT VIC AB 1 CTX 18 (SUTURE) IMPLANT
SUT VIC AB 2-0 CT1 27 (SUTURE) ×2
SUT VIC AB 2-0 CT1 TAPERPNT 27 (SUTURE) ×2 IMPLANT
SUT VICRYL 0 TIES 12 18 (SUTURE) ×2 IMPLANT
SUT VICRYL AB 2 0 TIES (SUTURE) ×2 IMPLANT
TOWEL OR 17X24 6PK STRL BLUE (TOWEL DISPOSABLE) ×2 IMPLANT
TOWEL OR 17X26 10 PK STRL BLUE (TOWEL DISPOSABLE) ×2 IMPLANT

## 2016-01-18 NOTE — Anesthesia Postprocedure Evaluation (Signed)
Anesthesia Post Note  Patient: Gordon CousinFrederick Walker  Procedure(s) Performed: Procedure(s) (LRB): AMPUTATION BELOW KNEE (Right)  Patient location during evaluation: PACU Anesthesia Type: General Level of consciousness: awake and alert Pain management: pain level controlled Vital Signs Assessment: post-procedure vital signs reviewed and stable Respiratory status: spontaneous breathing, nonlabored ventilation, respiratory function stable and patient connected to nasal cannula oxygen Cardiovascular status: blood pressure returned to baseline and stable Postop Assessment: no signs of nausea or vomiting Anesthetic complications: no       Last Vitals:  Vitals:   01/18/16 1951 01/18/16 2003  BP: (!) 179/83 (!) 184/76  Pulse: 89 91  Resp: 16 18  Temp: 36.4 C 36.6 C    Last Pain:  Vitals:   01/18/16 2003  TempSrc: Oral  PainSc:                  [name], [name]

## 2016-01-18 NOTE — Progress Notes (Signed)
Patient Bp: 151/86.  Notified on call NP :  K. Schorr

## 2016-01-18 NOTE — Anesthesia Preprocedure Evaluation (Addendum)
Anesthesia Evaluation  Patient identified by MRN, date of birth, ID band Patient awake    Airway Mallampati: III  TM Distance: >3 FB Neck ROM: Full    Dental  (+) Teeth Intact, Dental Advisory Given   Pulmonary neg pulmonary ROS,    breath sounds clear to auscultation + decreased breath sounds      Cardiovascular hypertension, + Peripheral Vascular Disease   Rhythm:Regular Rate:Normal     Neuro/Psych negative neurological ROS     GI/Hepatic negative GI ROS, Neg liver ROS,   Endo/Other  diabetes, Insulin Dependent  Renal/GU Renal disease     Musculoskeletal   Abdominal Normal abdominal exam  (+)   Peds  Hematology  (+) anemia ,   Anesthesia Other Findings   Reproductive/Obstetrics                             Anesthesia Physical Anesthesia Plan  ASA: III  Anesthesia Plan: General   Post-op Pain Management:    Induction: Intravenous  Airway Management Planned: LMA  Additional Equipment:   Intra-op Plan:   Post-operative Plan: Extubation in OR  Informed Consent: I have reviewed the patients History and Physical, chart, labs and discussed the procedure including the risks, benefits and alternatives for the proposed anesthesia with the patient or authorized representative who has indicated his/her understanding and acceptance.   Dental advisory given  Plan Discussed with: Anesthesiologist and Surgeon  Anesthesia Plan Comments:         Anesthesia Quick Evaluation

## 2016-01-18 NOTE — Progress Notes (Signed)
Patient is now on 6L nasal canula sating 90-94%. Family in room with patient. Continuous pulse ox is maintained. Will continue to closely monitor patient

## 2016-01-18 NOTE — Evaluation (Signed)
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  Has AKI, DKA, bacteremia and sepsis now with fever at his admission   Clinical Impression   Pt reports he was managing ADL independently PTA. Currently pt required mod assist for basic transfers and LB ADL, supervision and set up for seated UB ADL. Recommending SNF for follow up to maximize independence and safety with ADL and functional mobility prior to return home; pt and wife in agreement. Pt would benefit from continued skilled OT to address established goals.    Follow Up Recommendations  SNF    Equipment Recommendations  None recommended by OT    Recommendations for Other Services       Precautions / Restrictions Precautions Precautions: Fall Required Braces or Orthoses: Other Brace/Splint (insert for L shoe for midfoot amputation) Restrictions Weight Bearing Restrictions: Yes RLE Weight Bearing: Non weight bearing      Mobility Bed Mobility Overal bed mobility: Modified Independent             General bed mobility comments: HOB elevated with increased time required  Transfers Overall transfer level: Needs assistance Equipment used: Rolling walker (2 wheeled) Transfers: Sit to/from UGI CorporationStand;Stand Pivot Transfers Sit to Stand: Mod assist Stand pivot transfers: Mod assist       General transfer comment: Mod assist to boost up from EOB and for stand pivot to chair. Pt unsteady on L foot; reports he is typically more steady with L shoe insert that he uses at home.    Balance Overall balance assessment: Needs assistance Sitting-balance support: Feet supported;No upper extremity supported Sitting balance-Leahy Scale: Good     Standing balance support: Bilateral upper extremity supported Standing balance-Leahy Scale:  Poor Standing balance comment: RW for support                            ADL Overall ADL's : Needs assistance/impaired Eating/Feeding: Independent;Sitting   Grooming: Supervision/safety;Sitting   Upper Body Bathing: Set up;Supervision/ safety;Sitting   Lower Body Bathing: Moderate assistance;Sit to/from stand   Upper Body Dressing : Set up;Supervision/safety;Sitting   Lower Body Dressing: Moderate assistance;Sit to/from stand Lower Body Dressing Details (indicate cue type and reason): Pt able to adjust sock on L foot sitting EOB with supervision.  Toilet Transfer: Moderate assistance;Stand-pivot;BSC;RW Toilet Transfer Details (indicate cue type and reason): Simulated by stand pivot to chair         Functional mobility during ADLs: Moderate assistance;Rolling walker (for stand pivot only)       Vision Vision Assessment?: No apparent visual deficits   Perception     Praxis      Pertinent Vitals/Pain Pain Assessment: No/denies pain     Hand Dominance     Extremity/Trunk Assessment Upper Extremity Assessment Upper Extremity Assessment: Overall WFL for tasks assessed   Lower Extremity Assessment Lower Extremity Assessment: Defer to PT evaluation   Cervical / Trunk Assessment Cervical / Trunk Assessment: Normal   Communication Communication Communication: No difficulties   Cognition Arousal/Alertness: Awake/alert Behavior During Therapy: WFL for tasks assessed/performed Overall Cognitive Status: Within Functional Limits for tasks assessed                     General Comments       Exercises  Shoulder Instructions      Home Living Family/patient expects to be discharged to:: Skilled nursing facility                             Home Equipment: Dan HumphreysWalker - 2 wheels;Other (comment);Crutches (L shoe insert)          Prior Functioning/Environment Level of Independence: Independent with assistive device(s)         Comments: crutches or RW for mobility, independent with ADL        OT Problem List: Decreased strength;Impaired balance (sitting and/or standing);Decreased knowledge of use of DME or AE   OT Treatment/Interventions: Self-care/ADL training;DME and/or AE instruction;Therapeutic activities;Patient/family education;Balance training    OT Goals(Current goals can be found in the care plan section) Acute Rehab OT Goals Patient Stated Goal: to get up to walk and get home, fit a prosthesis OT Goal Formulation: With patient/family Time For Goal Achievement: 02/01/16 Potential to Achieve Goals: Good ADL Goals Pt Will Perform Lower Body Bathing: with supervision;sit to/from stand Pt Will Perform Lower Body Dressing: with supervision;sit to/from stand Pt Will Transfer to Toilet: with supervision;ambulating;bedside commode (BSC at least 5 feet away) Pt Will Perform Toileting - Clothing Manipulation and hygiene: with supervision;sit to/from stand  OT Frequency: Min 2X/week   Barriers to D/C: Inaccessible home environment;Decreased caregiver support  wife works durin the day, stairs in home       Co-evaluation              End of Session Equipment Utilized During Treatment: Rolling walker  Activity Tolerance: Patient tolerated treatment well Patient left: in chair;with call bell/phone within reach   Time: [id number] OT Time Calculation (min): 17 min Charges:  OT General Charges $OT Visit: 1 Procedure OT Evaluation $OT Eval Moderate Complexity: 1 Procedure G-Codes:     Gaye AlkenBailey A Damiyah Ditmars M.S., OTR/L Pager: [phone]  01/18/2016, 10:13 AM

## 2016-01-18 NOTE — Transfer of Care (Signed)
Immediate Anesthesia Transfer of Care Note  Patient: Gordon CousinFrederick Garriga  Procedure(s) Performed: Procedure(s): AMPUTATION BELOW KNEE (Right)  Patient Location: PACU  Anesthesia Type:General  Level of Consciousness: patient cooperative and responds to stimulation  Airway & Oxygen Therapy: Patient Spontanous Breathing and Patient connected to nasal cannula oxygen  Post-op Assessment: Report given to RN and Post -op Vital signs reviewed and stable  Post vital signs: Reviewed and stable  Last Vitals:  Vitals:   01/18/16 1745 01/18/16 1920  BP: (!) 177/80 (!) (P) 174/85  Pulse: 91   Resp: 16 16  Temp: 36.9 C 36.4 C    Last Pain:  Vitals:   01/18/16 1745  TempSrc: Oral  PainSc:       Patients Stated Pain Goal: 2 (01/16/16 0800)  Complications: No apparent anesthesia complications

## 2016-01-18 NOTE — Progress Notes (Signed)
Inpatient Diabetes Program Recommendations  AACE/ADA: New Consensus Statement on Inpatient Glycemic Control (2015)  Target Ranges:  Prepandial:   less than 140 mg/dL      Peak postprandial:   less than 180 mg/dL (1-2 hours)      Critically ill patients:  140 - 180 mg/dL   Lab Results  Component Value Date   GLUCAP 187 (H) 01/18/2016   HGBA1C 8.2 12/28/2015   Results for Gordon Walker, [name] (MRN [id number]) as of 01/18/2016 09:53  Ref. Range 01/17/2016 11:24 01/17/2016 17:12 01/17/2016 21:42 01/18/2016 00:03 01/18/2016 07:48  Glucose-Capillary Latest Ref Range: 65 - 99 mg/dL 409199 (H) 811192 (H) 914187 (H) 176 (H) 187 (H)   Review of Glycemic Control  Diabetes history:     DM Outpatient Diabetes medications:     Lantus 27 units QHS, Novolog 9 units TID with meals Current orders for Inpatient glycemic control:     Lantus 40 units daily,     Novolog 0-15 units TIDAC correction,     Novolog 6 units TIDAC meal coverage   Inpatient Diabetes Program Recommendations:      Noted that Novolog meal coverage added and CBG's WNL over past 24 hours.     NOTE from Orlando PennerMarie Byrd, RN, MSN, Inpt Diabetes Coordinator on 12/26: In reviewing the chart, noted patient is followed by Dr. Everardo AllEllison (Endocrinologist) and was last seen by Dr. Everardo AllEllison on 12/28/15 at which time A1C was 8.2%. Initial glucose was 1003 mg/dl and patient was on an insulin drip but has transitioned off IV insulin to SQ insulin.  Thank you,  Kristine LineaKaren Fatime Biswell, RN, MSN Diabetes Coordinator Inpatient Diabetes Program [phone] (Team Pager)

## 2016-01-18 NOTE — Significant Event (Signed)
Rapid Response Event Note Call received per floor RN regarding Pt recently arrived from surgery. RN found Patient unresponsive with eyes open. [facility] NP paged while RRT en route.  Overview: Time Called: 2018 Arrival Time: 2022 Event Type: Neurologic  Initial Focused Assessment: Pt found resting in bed, alert oriented x 4. Able to follow commands. Lungs clear po2 100% on NRB, RR 12 , heart tones WNL. Diaphoretic. Right surgical site dressing CDI, no shadowing assessed. HR 90s, BP 181/78. CBG 144   Interventions: Narcan given IVP on my arrival per NP. Pt complains of worsening pain in right surgical site. Weaned 02 down to 6 L [place].  K. Schor updated per myself regarding Pt status. Per NP, RN to given Dilaudid 0.5mg  as needed for pain per PRN already ordered. Advised to use pain meds sparingly.   Plan of Care (if not transferred): RN to monitor Pt closely, advised to update Provider and myself for worsening condition. RRT will follow tonight  Event Summary: Name of Physician Notified: Jerilynn MagesK. Schor [facility] NP at 2020    at    Outcome: Stayed in room and stabalized  Event End Time: 2055  Lolita RiegerWhite, Jeffie Widdowson Leigh Elai Vanwyk

## 2016-01-18 NOTE — Op Note (Signed)
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  Anesthesia: Gen.  EBL: 100 mL  Procedure after induction of general anesthesia Ancef prophylaxis timeout procedure proximal thigh tourniquet standard prepping and draping sterile skin marker was used to plug was elevated tourniquet was inflated. Amputation was performed 8-9 fingerbreadths below the tibial tubercle transfers for the anterior one half and then used a posterior flap. Vessels were divided oscillating saw was used to divide the tibia the anterior edge was beveled and rasped smooth. The fibula was divided 2 cm proximal. The anterior tibia vessel had slight bleeding in the posterior tib was a main vessel perineal vessel had occluded. Nerves were cut allowed to fall back after being cut on stretch. Sural nerve was cut lesser saphenous vein was coagulated. There is somewhat typical bleeding around the veins next to the fibula. Posterior flap was then tourniquet was deflated copious irrigation. Hemostasis obtained with tourniquet deflated. Posterior flap was pulled up with the gastrocsoleus tendon sutured over the anterior aspect of the tibia onto the periosteum. Fascia was closed on the lateral side and 2-0 Vicryl subtendinous tissue scale today staple closure and postop dressing. 2-0 silk were used for suture ligatures on the vessels. Veins were similarly tied nerves were allowed to be a pulled cut back on stretch and retract. Patient tolerated the procedure well postoperative S was applied and a knee immobilizer placed below in case he fell to protect the stump.

## 2016-01-18 NOTE — Progress Notes (Addendum)
PROGRESS NOTE                                                                                                                                                                                                             Patient Demographics:    Gordon Walker, is a 50 y.o. male, DOB - [birth year], ZOX:[id number]  Admit date - 01/14/2016   Admitting Physician Oretha Milch, MD  Outpatient Primary MD for the patient is Darrow Bussing, MD  LOS - 3  Outpatient Specialists:   Chief Complaint  Patient presents with  . Hyperglycemia       Brief Narrative  50 year old male with type 1 diabetes mellitus, peripheral vascular disease and hypertension was recently operated for a semi-tinnitus of right fifth toe with partial amputation of the toe on 12/30/2015 and discharged home on Bactrim presented with nausea and dry heaving associated epigastric pain. He was confused on the day of admission, increasingly tired with several episodes of watery loose stool. He was found to be in DKA and acute kidney injury. Patient was septic with fever of 100.45F and WBC of 21K. 1/2 blood culture was positive for Escherichia coli. Patient admitted with DKA and sepsis.    Subjective:   Denies any pain, nausea or vomiting.   Assessment  & Plan :    Principal Problem:   Sepsis (HCC) Secondary to Escherichia coli bacteremia in DKA on presentation. No clear source of infection. Sepsis resolved of his right fluids and empiric antibiotics. Narrow antibiotics to IV Ancef based on sensitivity (was on vancomycin and Zosyn).  Active Problems:   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, with long-term current use of insulin (HCC) Blood sugar of 1000 on presentation. Reports he may have missed some insulin doses with acute illness. Treated aggressively in the with IV fluids and IV insulin. Now resolved. Stable on Lantus and  pre-meal aspart.  Last A1c at endocrinologist office was 8.2 (on 12/6)     DKA (diabetic ketoacidoses) (HCC) Plan as outlined above.  Escherichia coli bacteremia. No clear source. Could be due to wound infection. Transaminitis on admission with normal abdominal ultrasound.  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 Empiric antibiotics.  Transaminitis and hyperbilirubinemia Ultrasound abdomen unremarkable. LFTs worsened in  a.m. lab. Bilirubin normalized. Check hepatitis panel  Acute kidney injury(HCC) Secondary to dehydration and sepsis. Now resolved with aggressive hydration.  Leukocytosis Possibly reactive with DKA and sepsis. Improving.  Hypokalemia/hypophosphatemia Replenished  Code Status : Full code  Family Communication  :None at bedside  Disposition Plan  : Seen by PT/OT and recommended SNF  Barriers For Discharge : Pending oral artery.  Consults  :  Orthopedics ultrasound abdomen  Procedures  :  Revision of right ankle stump  DVT Prophylaxis  :  Lovenox -   Lab Results  Component Value Date   PLT 288 01/18/2016    Antibiotics  :   Anti-infectives    Start     Dose/Rate Route Frequency Ordered Stop   01/18/16 1400  ceFAZolin (ANCEF) IVPB 1 g/50 mL premix     1 g 100 mL/hr over 30 Minutes Intravenous Every 8 hours 01/18/16 1055     01/16/16 1300  vancomycin (VANCOCIN) IVPB 1000 mg/200 mL premix  Status:  Discontinued     1,000 mg 200 mL/hr over 60 Minutes Intravenous Every 12 hours 01/16/16 1120 01/18/16 0946   01/15/16 2200  vancomycin (VANCOCIN) 1,250 mg in sodium chloride 0.9 % 250 mL IVPB  Status:  Discontinued     1,250 mg 166.7 mL/hr over 90 Minutes Intravenous Every 24 hours 01/15/16 0020 01/16/16 1120   01/15/16 0600  piperacillin-tazobactam (ZOSYN) IVPB 3.375 g  Status:  Discontinued     3.375 g 12.5 mL/hr over 240 Minutes Intravenous Every 8 hours 01/15/16 0020  01/18/16 0946   01/14/16 2230  piperacillin-tazobactam (ZOSYN) IVPB 3.375 g     3.375 g 100 mL/hr over 30 Minutes Intravenous  Once 01/14/16 2219 01/14/16 2324   01/14/16 2230  vancomycin (VANCOCIN) IVPB 1000 mg/200 mL premix     1,000 mg 200 mL/hr over 60 Minutes Intravenous  Once 01/14/16 2219 01/15/16 0113        Objective:   Vitals:   01/17/16 2140 01/18/16 0149 01/18/16 0500 01/18/16 0513  BP: 140/82 (!) 148/72  (!) 151/86  Pulse: 83 82  83  Resp:    18  Temp: 98.6 F (37 C)   99.4 F (37.4 C)  TempSrc: Oral   Oral  SpO2: 100% 100%  99%  Weight:   75 kg (165 lb 5 oz)   Height:        Wt Readings from Last 3 Encounters:  01/18/16 75 kg (165 lb 5 oz)  12/30/15 84.4 kg (186 lb)  12/28/15 84.4 kg (186 lb)     Intake/Output Summary (Last 24 hours) at 01/18/16 1415 Last data filed at 01/17/16 2257  Gross per 24 hour  Intake                0 ml  Output             1175 ml  Net            -1175 ml     Physical Exam  Gen: not in distress HEENT:  moist mucosa, supple neck Chest: clear b/l, no added sounds CVS: N S1&S2, no murmurs,  GI: soft, NT, ND, Musculoskeletal: warm, no edema,  dressing over right ankle stump      Data Review:    CBC  Recent Labs Lab 01/14/16 2143  01/15/16 2008 01/16/16 0431 01/16/16 0825 01/17/16 0349 01/18/16 0644  WBC 38.7*  < > 24.8* 21.3* 19.4* 21.3* 18.4*  HGB 9.7*  < > 8.5* 8.4* 8.2* 8.3*  8.2*  HCT 31.4*  < > 24.0* 24.3* 23.3* 24.1* 24.2*  PLT 408*  < > 285 294 277 276 288  MCV 90.8  < > 78.2 77.9* 78.5 79.8 80.1  MCH 28.0  < > 27.7 26.9 27.6 27.5 27.2  MCHC 30.9  < > 35.4 34.6 35.2 34.4 33.9  RDW 13.9  < > 11.8 12.1 12.1 12.7 12.4  LYMPHSABS 2.7  --   --   --   --   --   --   MONOABS 5.0*  --   --   --   --   --   --   EOSABS 0.4  --   --   --   --   --   --   BASOSABS 0.0  --   --   --   --   --   --   < > = values in this interval not displayed.  Chemistries   Recent Labs Lab 01/14/16 2143  01/15/16 0448   01/16/16 0037 01/16/16 0431 01/16/16 0824 01/17/16 0349 01/18/16 0644  NA 115*  < > 125*  < > 142 140 139 133* 134*  K >7.5*  < > 6.3*  < > 4.1 3.9 4.2 3.6 3.5  CL 75*  < > 92*  < > 111 110 109 102 100*  CO2 <7*  < > 11*  < > 24 26 24 26 27   GLUCOSE 1,003*  < > 770*  < > 167* 134* 181* 209* 200*  BUN 83*  < > 83*  < > 44* 38* 34* 21* 16  CREATININE 3.52*  < > 3.42*  < > 1.56* 1.53* 1.41* 1.09 0.99  CALCIUM 8.1*  < > 7.6*  < > 7.8* 7.6* 7.5* 7.6* 7.6*  MG  --   --  2.9*  --   --  2.5*  --  1.9  --   AST 80*  --   --   --   --   --   --   --  188*  ALT 67*  --   --   --   --   --   --   --  98*  ALKPHOS 266*  --   --   --   --   --   --   --  286*  BILITOT 3.0*  --   --   --   --   --   --   --  1.0  < > = values in this interval not displayed. ------------------------------------------------------------------------------------------------------------------ No results for input(s): CHOL, HDL, LDLCALC, TRIG, CHOLHDL, LDLDIRECT in the last 72 hours.  Lab Results  Component Value Date   HGBA1C 8.2 12/28/2015   ------------------------------------------------------------------------------------------------------------------ No results for input(s): TSH, T4TOTAL, T3FREE, THYROIDAB in the last 72 hours.  Invalid input(s): FREET3 ------------------------------------------------------------------------------------------------------------------ No results for input(s): VITAMINB12, FOLATE, FERRITIN, TIBC, IRON, RETICCTPCT in the last 72 hours.  Coagulation profile No results for input(s): INR, PROTIME in the last 168 hours.  No results for input(s): DDIMER in the last 72 hours.  Cardiac Enzymes No results for input(s): CKMB, TROPONINI, MYOGLOBIN in the last 168 hours.  Invalid input(s): CK ------------------------------------------------------------------------------------------------------------------ No results found for: BNP  Inpatient Medications  Scheduled Meds: .  ceFAZolin  (ANCEF) IV  1 g Intravenous Q8H  . heparin  5,000 Units Subcutaneous Q8H  . insulin aspart  0-15 Units Subcutaneous TID WC  . insulin aspart  6 Units Subcutaneous TID WC  . insulin  glargine  40 Units Subcutaneous Daily   Continuous Infusions: PRN Meds:.sodium chloride  Micro Results Recent Results (from the past 240 hour(s))  Urine culture     Status: None   Collection Time: 01/14/16  9:44 PM  Result Value Ref Range Status   Specimen Description URINE, CLEAN CATCH  Final   Special Requests NONE  Final   Culture NO GROWTH  Final   Report Status 01/16/2016 FINAL  Final  Blood Culture (routine x 2)     Status: Abnormal   Collection Time: 01/14/16 10:07 PM  Result Value Ref Range Status   Specimen Description BLOOD RIGHT HAND  Final   Special Requests IN PEDIATRIC BOTTLE 1CC  Final   Culture  Setup Time   Final    GRAM NEGATIVE RODS IN PEDIATRIC BOTTLE CRITICAL RESULT CALLED TO, READ BACK BY AND VERIFIED WITH: M SHUDA,PHARMD AT 1191 01/15/16 BY L BENFIELD    Culture ESCHERICHIA COLI (A)  Final   Report Status 01/18/2016 FINAL  Final   Organism ID, Bacteria ESCHERICHIA COLI  Final      Susceptibility   Escherichia coli - MIC*    AMPICILLIN >=32 RESISTANT Resistant     CEFAZOLIN <=4 SENSITIVE Sensitive     CEFEPIME <=1 SENSITIVE Sensitive     CEFTAZIDIME <=1 SENSITIVE Sensitive     CEFTRIAXONE <=1 SENSITIVE Sensitive     CIPROFLOXACIN >=4 RESISTANT Resistant     GENTAMICIN <=1 SENSITIVE Sensitive     IMIPENEM <=0.25 SENSITIVE Sensitive     TRIMETH/SULFA >=320 RESISTANT Resistant     AMPICILLIN/SULBACTAM 16 INTERMEDIATE Intermediate     PIP/TAZO <=4 SENSITIVE Sensitive     Extended ESBL NEGATIVE Sensitive     * ESCHERICHIA COLI  Blood Culture ID Panel (Reflexed)     Status: Abnormal   Collection Time: 01/14/16 10:07 PM  Result Value Ref Range Status   Enterococcus species NOT DETECTED NOT DETECTED Final   Listeria monocytogenes NOT DETECTED NOT DETECTED Final    Staphylococcus species NOT DETECTED NOT DETECTED Final   Staphylococcus aureus NOT DETECTED NOT DETECTED Final   Streptococcus species NOT DETECTED NOT DETECTED Final   Streptococcus agalactiae NOT DETECTED NOT DETECTED Final   Streptococcus pneumoniae NOT DETECTED NOT DETECTED Final   Streptococcus pyogenes NOT DETECTED NOT DETECTED Final   Acinetobacter baumannii NOT DETECTED NOT DETECTED Final   Enterobacteriaceae species DETECTED (A) NOT DETECTED Final    Comment: CRITICAL RESULT CALLED TO, READ BACK BY AND VERIFIED WITH: M SHUDA,PHARMD AT 1828 01/15/16 BY L BENFIELD    Enterobacter cloacae complex NOT DETECTED NOT DETECTED Final   Escherichia coli DETECTED (A) NOT DETECTED Final    Comment: CRITICAL RESULT CALLED TO, READ BACK BY AND VERIFIED WITH: M SHUDA,PHARMD AT 1828 01/15/16 BY L BENFIELD    Klebsiella oxytoca NOT DETECTED NOT DETECTED Final   Klebsiella pneumoniae NOT DETECTED NOT DETECTED Final   Proteus species NOT DETECTED NOT DETECTED Final   Serratia marcescens NOT DETECTED NOT DETECTED Final   Carbapenem resistance NOT DETECTED NOT DETECTED Final   Haemophilus influenzae NOT DETECTED NOT DETECTED Final   Neisseria meningitidis NOT DETECTED NOT DETECTED Final   Pseudomonas aeruginosa NOT DETECTED NOT DETECTED Final   Candida albicans NOT DETECTED NOT DETECTED Final   Candida glabrata NOT DETECTED NOT DETECTED Final   Candida krusei NOT DETECTED NOT DETECTED Final   Candida parapsilosis NOT DETECTED NOT DETECTED Final   Candida tropicalis NOT DETECTED NOT DETECTED Final  Blood Culture (routine x  2)     Status: None (Preliminary result)   Collection Time: 01/14/16 10:47 PM  Result Value Ref Range Status   Specimen Description BLOOD RIGHT ANTECUBITAL  Final   Special Requests BOTTLES DRAWN AEROBIC AND ANAEROBIC 5CC EA  Final   Culture NO GROWTH 2 DAYS  Final   Report Status PENDING  Incomplete  MRSA PCR Screening     Status: None   Collection Time: 01/15/16  1:43 AM    Result Value Ref Range Status   MRSA by PCR NEGATIVE NEGATIVE Final    Comment:        The GeneXpert MRSA Assay (FDA approved for NASAL specimens only), is one component of a comprehensive MRSA colonization surveillance program. It is not intended to diagnose MRSA infection nor to guide or monitor treatment for MRSA infections.   Surgical pcr screen     Status: Abnormal   Collection Time: 01/17/16  6:47 PM  Result Value Ref Range Status   MRSA, PCR INVALID RESULTS, SPECIMEN SENT FOR CULTURE (A) NEGATIVE Final    Comment: RESULT CALLED TO, READ BACK BY AND VERIFIED WITH: Q LOFTIN,RN @0103  01/18/16 MKELLY,MLT    Staphylococcus aureus INVALID RESULTS, SPECIMEN SENT FOR CULTURE (A) NEGATIVE Final    Comment:        The Xpert SA Assay (FDA approved for NASAL specimens in patients over 48 years of age), is one component of a comprehensive surveillance program.  Test performance has been validated by [facility] for patients greater than or equal to 48 year old. It is not intended to diagnose infection nor to guide or monitor treatment.   MRSA culture     Status: None (Preliminary result)   Collection Time: 01/17/16  6:47 PM  Result Value Ref Range Status   Specimen Description NASAL SWAB  Final   Special Requests NONE  Final   Culture CULTURE REINCUBATED FOR BETTER GROWTH  Final   Report Status PENDING  Incomplete    Radiology Reports Mr Foot Right W Wo Contrast  Result Date: 01/15/2016 CLINICAL DATA:  Peripheral vascular disease, severe diabetic ketoacidosis, glucose values greater than 600 today. Extensive gas in the soft tissues of the right foot, fifth toe amputation sixteen days ago. EXAM: MRI OF THE RIGHT FOREFOOT WITHOUT AND WITH CONTRAST TECHNIQUE: Multiplanar, multisequence MR imaging of the right foot was performed before and after the administration of intravenous contrast. CONTRAST:  7mL MULTIHANCE GADOBENATE DIMEGLUMINE 529 MG/ML IV SOLN COMPARISON:  01/15/2016  FINDINGS: Despite efforts by the technologist and patient, motion artifact is present on today's exam and could not be eliminated. This reduces exam sensitivity and specificity. Bones/Joint/Cartilage Abnormal scattered gas signal intensities tracking proximally in the first, second, third, fourth, and residual fifth metatarsal and also in the cuneiform spur. Abnormal osseous edema in the fourth metatarsal distally and in the cuboid. There is only subtle accentuated enhancement distally in the fourth metatarsal in the region of the edema. Suspected enhancement in the cuboid and in patchy regions of the distal first metatarsal. Ligaments Lisfranc ligament obscured Muscles and Tendons Extensive abnormal gas density tracking along the musculotendinous structures of the forefoot. Regional muscular enhancement compatible with myositis. Soft tissues Extensive subcutaneous edema and enhancement compatible with cellulitis. A focal well-defined abscess is not well seen. IMPRESSION: 1. Extensive abnormal gas in the soft tissues and bony structures of the right forefoot and distal midfoot, favoring gas-forming infection. This can be fulminant and may require further amputation. These results were called by telephone at  the time of interpretation on 01/15/2016 at 5:24 pm to Dr. Ophelia Charter, who verbally acknowledged these results. Electronically Signed   By: Gaylyn Rong M.D.   On: 01/15/2016 17:24   Dg Chest Portable 1 View  Result Date: 01/14/2016 CLINICAL DATA:  Altered mental status.  Hyperglycemia. EXAM: PORTABLE CHEST 1 VIEW COMPARISON:  None. FINDINGS: A single AP portable view of the chest demonstrates no focal airspace consolidation or alveolar edema. The lungs are grossly clear. There is no large effusion or pneumothorax. Cardiac and mediastinal contours appear unremarkable. IMPRESSION: No active disease. Electronically Signed   By: Ellery Plunk M.D.   On: 01/14/2016 22:16   Dg Abd Portable 1v  Result  Date: 01/15/2016 CLINICAL DATA:  Abdominal pain. EXAM: PORTABLE ABDOMEN - 1 VIEW COMPARISON:  None. FINDINGS: Bowel gas pattern is nonobstructive. No evidence of free peritoneal air. Nonspecific calcific densities just left of the spine in the mid abdomen. Remaining bones and soft tissues are within normal. IMPRESSION: Nonobstructive bowel gas pattern. Electronically Signed   By: Elberta Fortis M.D.   On: 01/15/2016 08:18   Dg Foot 2 Views Right  Result Date: 01/15/2016 CLINICAL DATA:  Right foot infection. Fifth toe amputation 12/30/2015. EXAM: RIGHT FOOT - 2 VIEW COMPARISON:  None. FINDINGS: Evidence of patient's recent transmetatarsal amputation of the fifth toe. There is subcutaneous air over the plantar and dorsal soft tissues of the midfoot and forefoot which may be due to patient's diffuse soft tissue infection. Evidence of small vessel atherosclerotic disease. No definite bone destruction to suggest osteomyelitis. IMPRESSION: Evidence of recent fifth toe transmetatarsal amputation. Moderate air within the dorsal and plantar soft tissues of the mid to forefoot suggesting gas-forming infection. Electronically Signed   By: Elberta Fortis M.D.   On: 01/15/2016 11:52   US Abdomen Limited Ruq  Result Date: 01/15/2016 CLINICAL DATA:  Hyperbilirubinemia, type I diabetes mellitus, hypertension EXAM: US ABDOMEN LIMITED - RIGHT UPPER QUADRANT COMPARISON:  None FINDINGS: Gallbladder: Normally distended with dependent sludge. No gallbladder wall thickening, pericholecystic fluid, definite calculi, or sonographic Murphy sign. Common bile duct: Diameter: Normal caliber 2 mm diameter Liver: Mildly heterogeneous. Hepatopetal portal venous flow. No definite hepatic mass or nodularity. No RIGHT upper quadrant ascites. Small RIGHT pleural effusion incidentally noted. IMPRESSION: Small none dependent sludge within gallbladder. No evidence of cholelithiasis, cholecystitis or biliary dilatation. Small RIGHT pleural  effusion. Electronically Signed   By: Ulyses Southward M.D.   On: 01/15/2016 09:56    Time Spent in minutes  25   Eddie North M.D on 01/18/2016 at 2:15 PM  Between 7am to 7pm - Pager - [phone]  After 7pm go to [email or web address] - password [facility]  [facility] -  Office  [phone]

## 2016-01-18 NOTE — Interval H&P Note (Signed)
History and Physical Interval Note:  01/18/2016 6:02 PM  Gordon Walker  has presented today for surgery, with the diagnosis of gas gangrene  The various methods of treatment have been discussed with the patient and family. After consideration of risks, benefits and other options for treatment, the patient has consented to  Procedure(s): AMPUTATION BELOW KNEE (Right) as a surgical intervention .  The patient's history has been reviewed, patient examined, no change in status, stable for surgery.  I have reviewed the patient's chart and labs.  Questions were answered to the patient's satisfaction.     Eldred MangesMark C Mackenzie Lia

## 2016-01-18 NOTE — H&P (View-Only) (Signed)
Reason for Consult:gas gangrene right foot Referring Physician: Elsworth Soho  MD   ICU  Gordon Walker is an 50 y.o. male.  HPI: 50yo IDDM with   Past Medical History:  Diagnosis Date  . Diabetes mellitus without complication (HCC)    Type 1  . Heart murmur    "years ago"  . Hypertension    "years ago"  . 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, with long-term current use of insulin [facility])     Past Surgical History:  Procedure Laterality Date  . AMPUTATION Left 01/07/2015   Procedure: Left Foot 1st Ray Amputation;  Surgeon: Newt Minion, MD;  Location: [place];  Service: Orthopedics;  Laterality: Left;  . AMPUTATION Left 07/15/2015   Procedure: Left Transmetatarsal Amputation;  Surgeon: Newt Minion, MD;  Location: [place];  Service: Orthopedics;  Laterality: Left;  . AMPUTATION Right 12/30/2015   Procedure: 5th Ray Amputation Right Foot;  Surgeon: Newt Minion, MD;  Location: [place];  Service: Orthopedics;  Laterality: Right;    Family History  Problem Relation Age of Onset  . Hypertension Mother   . Diabetes Mother   . Cancer Mother   . Prostate cancer Father     Social History:  reports that he has never smoked. He has never used smokeless tobacco. He reports that he does not drink alcohol or use drugs.  Allergies: No Known Allergies  Medications: I have reviewed the patient's current medications.  Results for orders placed or performed during the hospital encounter of 01/14/16 (from the past 48 hour(s))  CBG monitoring, ED     Status: Abnormal   Collection Time: 01/14/16  9:41 PM  Result Value Ref Range   Glucose-Capillary >600 (HH) 65 - 99 mg/dL   Comment 1 Notify RN   Urinalysis, Routine w reflex microscopic     Status: Abnormal   Collection Time: 01/14/16  9:43 PM  Result Value Ref Range   Color, Urine YELLOW YELLOW   APPearance HAZY (A) CLEAR   Specific Gravity, Urine 1.016 1.005 - 1.030   pH 5.0 5.0 - 8.0   Glucose, UA >=500  (A) NEGATIVE mg/dL   Hgb urine dipstick SMALL (A) NEGATIVE   Bilirubin Urine NEGATIVE NEGATIVE   Ketones, ur 20 (A) NEGATIVE mg/dL   Protein, ur NEGATIVE NEGATIVE mg/dL   Nitrite NEGATIVE NEGATIVE   Leukocytes, UA NEGATIVE NEGATIVE   RBC / HPF 0-5 0 - 5 RBC/hpf   WBC, UA 0-5 0 - 5 WBC/hpf   Bacteria, UA RARE (A) NONE SEEN   Squamous Epithelial / LPF NONE SEEN NONE SEEN   Hyaline Casts, UA PRESENT   CBC with Differential (PN[facility])     Status: Abnormal   Collection Time: 01/14/16  9:43 PM  Result Value Ref Range   WBC 38.7 (H) 4.0 - 10.5 K/uL   RBC 3.46 (L) 4.22 - 5.81 MIL/uL   Hemoglobin 9.7 (L) 13.0 - 17.0 g/dL   HCT 31.4 (L) 39.0 - 52.0 %   MCV 90.8 78.0 - 100.0 fL   MCH 28.0 26.0 - 34.0 pg   MCHC 30.9 30.0 - 36.0 g/dL   RDW 13.9 11.5 - 15.5 %   Platelets 408 (H) 150 - 400 K/uL   Neutrophils Relative % 55 %   Lymphocytes Relative 7 %   Monocytes Relative 13 %   Eosinophils Relative 1 %   Basophils Relative 0 %   Band Neutrophils 24 %   Metamyelocytes Relative  0 %   Myelocytes 0 %   Promyelocytes Absolute 0 %   Blasts 0 %   nRBC 0 0 /100 WBC   Neutro Abs 30.6 (H) 1.7 - 7.7 K/uL   Lymphs Abs 2.7 0.7 - 4.0 K/uL   Monocytes Absolute 5.0 (H) 0.1 - 1.0 K/uL   Eosinophils Absolute 0.4 0.0 - 0.7 K/uL   Basophils Absolute 0.0 0.0 - 0.1 K/uL   RBC Morphology POLYCHROMASIA PRESENT    WBC Morphology INCREASED BANDS (>20% BANDS)     Comment: MODERATE LEFT SHIFT (>5% METAS AND MYELOS,OCC PRO NOTED) ATYPICAL LYMPHOCYTES    Smear Review LARGE PLATELETS PRESENT   Comprehensive metabolic panel     Status: Abnormal   Collection Time: 01/14/16  9:43 PM  Result Value Ref Range   Sodium 115 (LL) 135 - 145 mmol/L    Comment: CRITICAL RESULT CALLED TO, READ BACK BY AND VERIFIED WITH: FRANCISCO J,RN 01/14/16 2248 WAYK    Potassium >7.5 (HH) 3.5 - 5.1 mmol/L    Comment: NO VISIBLE HEMOLYSIS CRITICAL RESULT CALLED TO, READ BACK BY AND VERIFIED WITH: FRANCISCO J,RN 01/14/16 2248 WAYK     Chloride 75 (L) 101 - 111 mmol/L   CO2 <7 (L) 22 - 32 mmol/L   Glucose, Bld 1,003 (HH) 65 - 99 mg/dL    Comment: CRITICAL RESULT CALLED TO, READ BACK BY AND VERIFIED WITH: FRANCISCO J,RN 01/14/16 2248 WAYK    BUN 83 (H) 6 - 20 mg/dL   Creatinine, Ser 3.52 (H) 0.61 - 1.24 mg/dL   Calcium 8.1 (L) 8.9 - 10.3 mg/dL   Total Protein 6.8 6.5 - 8.1 g/dL   Albumin 2.0 (L) 3.5 - 5.0 g/dL   AST 80 (H) 15 - 41 U/L   ALT 67 (H) 17 - 63 U/L   Alkaline Phosphatase 266 (H) 38 - 126 U/L   Total Bilirubin 3.0 (H) 0.3 - 1.2 mg/dL   GFR calc non Af Amer 19 (L) >60 mL/min   GFR calc Af Amer 22 (L) >60 mL/min    Comment: (NOTE) The eGFR has been calculated using the CKD EPI equation. This calculation has not been validated in all clinical situations. eGFR's persistently <60 mL/min signify possible Chronic Kidney Disease.   Blood Culture (routine x 2)     Status: None (Preliminary result)   Collection Time: 01/14/16 10:07 PM  Result Value Ref Range   Specimen Description BLOOD RIGHT HAND    Special Requests IN PEDIATRIC BOTTLE 1CC    Culture  Setup Time      GRAM NEGATIVE RODS IN PEDIATRIC BOTTLE Organism ID to follow    Culture PENDING    Report Status PENDING   I-Stat Chem 8, ED  (not at [facility], [facility])     Status: Abnormal   Collection Time: 01/14/16 10:12 PM  Result Value Ref Range   Sodium 113 (LL) 135 - 145 mmol/L   Potassium 7.6 (HH) 3.5 - 5.1 mmol/L   Chloride 82 (L) 101 - 111 mmol/L   BUN 75 (H) 6 - 20 mg/dL   Creatinine, Ser 2.70 (H) 0.61 - 1.24 mg/dL   Glucose, Bld >700 (HH) 65 - 99 mg/dL   Calcium, Ion 0.94 (L) 1.15 - 1.40 mmol/L   TCO2 8 0 - 100 mmol/L   Hemoglobin 11.2 (L) 13.0 - 17.0 g/dL   HCT 33.0 (L) 39.0 - 52.0 %   Comment NOTIFIED PHYSICIAN   I-Stat Venous Blood Gas, ED  ([place], [facility])  Status: Abnormal   Collection Time: 01/14/16 10:12 PM  Result Value Ref Range   pH, Ven 7.120 (LL) 7.250 - 7.430   pCO2, Ven 15.4 (LL) 44.0 - 60.0 mmHg   pO2, Ven 42.0 32.0 - 45.0 mmHg    Bicarbonate 5.0 (L) 20.0 - 28.0 mmol/L   TCO2 5 0 - 100 mmol/L   O2 Saturation 63.0 %   Acid-base deficit 22.0 (H) 0.0 - 2.0 mmol/L   Patient temperature HIDE    Sample type VENOUS    Comment NOTIFIED PHYSICIAN   I-Stat CG4 Lactic Acid, ED  (not at  [facility])     Status: Abnormal   Collection Time: 01/14/16 10:13 PM  Result Value Ref Range   Lactic Acid, Venous 5.44 (HH) 0.5 - 1.9 mmol/L   Comment NOTIFIED PHYSICIAN   Blood gas, arterial     Status: Abnormal   Collection Time: 01/14/16 10:26 PM  Result Value Ref Range   FIO2 21.00    Delivery systems ROOM AIR    pH, Arterial 7.130 (LL) 7.350 - 7.450    Comment: CRITICAL RESULT CALLED TO, READ BACK BY AND VERIFIED WITH: VICTOR TAYLOR RRT,AT 2235 BY MIRIAM TURRIFF RRT ON 01/14/2016    pCO2 arterial BELOW REPORTABLE RANGE 32.0 - 48.0 mmHg   pO2, Arterial 124 (H) 83.0 - 108.0 mmHg   Bicarbonate 3.3 (L) 20.0 - 28.0 mmol/L   Acid-base deficit 24.9 (H) 0.0 - 2.0 mmol/L   O2 Saturation 95.6 %   Patient temperature 98.6    Collection site BRACHIAL ARTERY    Drawn by [id number]    Sample type ARTERIAL DRAW    Allens test (pass/fail) PASS PASS  CBG monitoring, ED     Status: Abnormal   Collection Time: 01/14/16 11:00 PM  Result Value Ref Range   Glucose-Capillary >600 (HH) 65 - 99 mg/dL  CBG monitoring, ED     Status: Abnormal   Collection Time: 01/15/16 12:07 AM  Result Value Ref Range   Glucose-Capillary >600 (HH) 65 - 99 mg/dL  CBG monitoring, ED     Status: Abnormal   Collection Time: 01/15/16  1:10 AM  Result Value Ref Range   Glucose-Capillary >600 (HH) 65 - 99 mg/dL  I-Stat CG4 Lactic Acid, ED  (not at  [facility])     Status: Abnormal   Collection Time: 01/15/16  1:11 AM  Result Value Ref Range   Lactic Acid, Venous 3.90 (HH) 0.5 - 1.9 mmol/L   Comment NOTIFIED PHYSICIAN   MRSA PCR Screening     Status: None   Collection Time: 01/15/16  1:43 AM  Result Value Ref Range   MRSA by PCR NEGATIVE NEGATIVE    Comment:        The  GeneXpert MRSA Assay (FDA approved for NASAL specimens only), is one component of a comprehensive MRSA colonization surveillance program. It is not intended to diagnose MRSA infection nor to guide or monitor treatment for MRSA infections.   Glucose, capillary     Status: Abnormal   Collection Time: 01/15/16  2:08 AM  Result Value Ref Range   Glucose-Capillary >600 (HH) 65 - 99 mg/dL   Comment 1 Glucose Stabilizer   Beta-hydroxybutyric acid     Status: Abnormal   Collection Time: 01/15/16  2:16 AM  Result Value Ref Range   Beta-Hydroxybutyric Acid >8.00 (H) 0.05 - 0.27 mmol/L    Comment: RESULTS CONFIRMED BY MANUAL DILUTION  Bilirubin, direct     Status: Abnormal   Collection Time:  01/15/16  2:16 AM  Result Value Ref Range   Bilirubin, Direct 1.0 (H) 0.1 - 0.5 mg/dL  Basic metabolic panel     Status: Abnormal   Collection Time: 01/15/16  2:16 AM  Result Value Ref Range   Sodium 121 (L) 135 - 145 mmol/L    Comment: DELTA CHECK NOTED   Potassium 6.7 (HH) 3.5 - 5.1 mmol/L    Comment: CRITICAL RESULT CALLED TO, READ BACK BY AND VERIFIED WITH: HASSAN Z,RN 01/15/16 0350 WAYK    Chloride 87 (L) 101 - 111 mmol/L   CO2 7 (L) 22 - 32 mmol/L   Glucose, Bld 862 (HH) 65 - 99 mg/dL    Comment: CRITICAL RESULT CALLED TO, READ BACK BY AND VERIFIED WITH: HASSAN Z,RN 01/15/16 0350 WAYK    BUN 83 (H) 6 - 20 mg/dL   Creatinine, Ser 3.79 (H) 0.61 - 1.24 mg/dL    Comment: DELTA CHECK NOTED   Calcium 7.4 (L) 8.9 - 10.3 mg/dL   GFR calc non Af Amer 17 (L) >60 mL/min   GFR calc Af Amer 20 (L) >60 mL/min    Comment: (NOTE) The eGFR has been calculated using the CKD EPI equation. This calculation has not been validated in all clinical situations. eGFR's persistently <60 mL/min signify possible Chronic Kidney Disease.    Anion gap 27 (H) 5 - 15  Lipase, blood     Status: None   Collection Time: 01/15/16  2:16 AM  Result Value Ref Range   Lipase 18 11 - 51 U/L  Glucose, capillary      Status: Abnormal   Collection Time: 01/15/16  3:13 AM  Result Value Ref Range   Glucose-Capillary >600 (HH) 65 - 99 mg/dL   Comment 1 Glucose Stabilizer   Glucose, capillary     Status: Abnormal   Collection Time: 01/15/16  4:16 AM  Result Value Ref Range   Glucose-Capillary >600 (HH) 65 - 99 mg/dL   Comment 1 Glucose Stabilizer   Basic metabolic panel     Status: Abnormal   Collection Time: 01/15/16  4:48 AM  Result Value Ref Range   Sodium 125 (L) 135 - 145 mmol/L   Potassium 6.3 (HH) 3.5 - 5.1 mmol/L    Comment: CRITICAL RESULT CALLED TO, READ BACK BY AND VERIFIED WITH: HAYES C,RN 01/15/16 0555 WAYK    Chloride 92 (L) 101 - 111 mmol/L   CO2 11 (L) 22 - 32 mmol/L   Glucose, Bld 770 (HH) 65 - 99 mg/dL    Comment: CRITICAL RESULT CALLED TO, READ BACK BY AND VERIFIED WITH: HAYES C,RN 01/15/16 0555 WAYK    BUN 83 (H) 6 - 20 mg/dL   Creatinine, Ser 3.42 (H) 0.61 - 1.24 mg/dL   Calcium 7.6 (L) 8.9 - 10.3 mg/dL   GFR calc non Af Amer 20 (L) >60 mL/min   GFR calc Af Amer 23 (L) >60 mL/min    Comment: (NOTE) The eGFR has been calculated using the CKD EPI equation. This calculation has not been validated in all clinical situations. eGFR's persistently <60 mL/min signify possible Chronic Kidney Disease.    Anion gap 22 (H) 5 - 15  Magnesium     Status: Abnormal   Collection Time: 01/15/16  4:48 AM  Result Value Ref Range   Magnesium 2.9 (H) 1.7 - 2.4 mg/dL  Phosphorus     Status: None   Collection Time: 01/15/16  4:48 AM  Result Value Ref Range   Phosphorus 3.9 2.5 -  4.6 mg/dL  CBC     Status: Abnormal   Collection Time: 01/15/16  4:48 AM  Result Value Ref Range   WBC 28.4 (H) 4.0 - 10.5 K/uL   RBC 3.51 (L) 4.22 - 5.81 MIL/uL   Hemoglobin 9.7 (L) 13.0 - 17.0 g/dL   HCT 28.7 (L) 39.0 - 52.0 %   MCV 81.8 78.0 - 100.0 fL    Comment: DELTA CHECK NOTED REPEATED TO VERIFY    MCH 27.6 26.0 - 34.0 pg   MCHC 33.8 30.0 - 36.0 g/dL   RDW 12.6 11.5 - 15.5 %   Platelets 348 150 -  400 K/uL  Glucose, capillary     Status: Abnormal   Collection Time: 01/15/16  5:19 AM  Result Value Ref Range   Glucose-Capillary >600 (HH) 65 - 99 mg/dL   Comment 1 Glucose Stabilizer   Glucose, capillary     Status: Abnormal   Collection Time: 01/15/16  6:16 AM  Result Value Ref Range   Glucose-Capillary >600 (HH) 65 - 99 mg/dL   Comment 1 Glucose Stabilizer   Glucose, capillary     Status: Abnormal   Collection Time: 01/15/16  7:24 AM  Result Value Ref Range   Glucose-Capillary 521 (HH) 65 - 99 mg/dL   Comment 1 Glucose Stabilizer   Glucose, capillary     Status: Abnormal   Collection Time: 01/15/16  8:21 AM  Result Value Ref Range   Glucose-Capillary 561 (HH) 65 - 99 mg/dL   Comment 1 Document in Chart   Basic metabolic panel     Status: Abnormal   Collection Time: 01/15/16  9:14 AM  Result Value Ref Range   Sodium 130 (L) 135 - 145 mmol/L   Potassium 5.1 3.5 - 5.1 mmol/L    Comment: DELTA CHECK NOTED   Chloride 99 (L) 101 - 111 mmol/L   CO2 20 (L) 22 - 32 mmol/L   Glucose, Bld 481 (H) 65 - 99 mg/dL   BUN 74 (H) 6 - 20 mg/dL   Creatinine, Ser 2.56 (H) 0.61 - 1.24 mg/dL   Calcium 7.7 (L) 8.9 - 10.3 mg/dL   GFR calc non Af Amer 28 (L) >60 mL/min   GFR calc Af Amer 32 (L) >60 mL/min    Comment: (NOTE) The eGFR has been calculated using the CKD EPI equation. This calculation has not been validated in all clinical situations. eGFR's persistently <60 mL/min signify possible Chronic Kidney Disease.    Anion gap 11 5 - 15  CBC     Status: Abnormal   Collection Time: 01/15/16  9:14 AM  Result Value Ref Range   WBC 25.2 (H) 4.0 - 10.5 K/uL    Comment: CONSISTENT WITH PREVIOUS RESULT   RBC 3.33 (L) 4.22 - 5.81 MIL/uL   Hemoglobin 9.2 (L) 13.0 - 17.0 g/dL   HCT 26.2 (L) 39.0 - 52.0 %   MCV 78.7 78.0 - 100.0 fL   MCH 27.6 26.0 - 34.0 pg   MCHC 35.1 30.0 - 36.0 g/dL   RDW 12.0 11.5 - 15.5 %   Platelets 331 150 - 400 K/uL  Glucose, capillary     Status: Abnormal    Collection Time: 01/15/16  9:25 AM  Result Value Ref Range   Glucose-Capillary 445 (H) 65 - 99 mg/dL   Comment 1 Document in Chart   Glucose, capillary     Status: Abnormal   Collection Time: 01/15/16 10:08 AM  Result Value Ref Range   Glucose-Capillary  497 (H) 65 - 99 mg/dL  Glucose, capillary     Status: Abnormal   Collection Time: 01/15/16 11:15 AM  Result Value Ref Range   Glucose-Capillary 432 (H) 65 - 99 mg/dL   Comment 1 Document in Chart   Basic metabolic panel     Status: Abnormal   Collection Time: 01/15/16 11:23 AM  Result Value Ref Range   Sodium 132 (L) 135 - 145 mmol/L   Potassium 4.8 3.5 - 5.1 mmol/L   Chloride 103 101 - 111 mmol/L   CO2 24 22 - 32 mmol/L   Glucose, Bld 370 (H) 65 - 99 mg/dL   BUN 70 (H) 6 - 20 mg/dL   Creatinine, Ser 2.40 (H) 0.61 - 1.24 mg/dL   Calcium 7.7 (L) 8.9 - 10.3 mg/dL   GFR calc non Af Amer 30 (L) >60 mL/min   GFR calc Af Amer 35 (L) >60 mL/min    Comment: (NOTE) The eGFR has been calculated using the CKD EPI equation. This calculation has not been validated in all clinical situations. eGFR's persistently <60 mL/min signify possible Chronic Kidney Disease.    Anion gap 5 5 - 15  Glucose, capillary     Status: Abnormal   Collection Time: 01/15/16 12:06 PM  Result Value Ref Range   Glucose-Capillary 336 (H) 65 - 99 mg/dL   Comment 1 Document in Chart   Glucose, capillary     Status: Abnormal   Collection Time: 01/15/16 12:21 PM  Result Value Ref Range   Glucose-Capillary 343 (H) 65 - 99 mg/dL   Comment 1 Document in Chart   Glucose, capillary     Status: Abnormal   Collection Time: 01/15/16  1:06 PM  Result Value Ref Range   Glucose-Capillary 292 (H) 65 - 99 mg/dL   Comment 1 Document in Chart   Glucose, capillary     Status: Abnormal   Collection Time: 01/15/16  1:59 PM  Result Value Ref Range   Glucose-Capillary 300 (H) 65 - 99 mg/dL   Comment 1 Document in Chart   Glucose, capillary     Status: Abnormal   Collection  Time: 01/15/16  3:12 PM  Result Value Ref Range   Glucose-Capillary 256 (H) 65 - 99 mg/dL   Comment 1 Document in Chart     Mr Foot Right W Wo Contrast  Result Date: 01/15/2016 CLINICAL DATA:  Peripheral vascular disease, severe diabetic ketoacidosis, glucose values greater than 600 today. Extensive gas in the soft tissues of the right foot, fifth toe amputation sixteen days ago. EXAM: MRI OF THE RIGHT FOREFOOT WITHOUT AND WITH CONTRAST TECHNIQUE: Multiplanar, multisequence MR imaging of the right foot was performed before and after the administration of intravenous contrast. CONTRAST:  33m MULTIHANCE GADOBENATE DIMEGLUMINE 529 MG/ML IV SOLN COMPARISON:  01/15/2016 FINDINGS: Despite efforts by the technologist and patient, motion artifact is present on today's exam and could not be eliminated. This reduces exam sensitivity and specificity. Bones/Joint/Cartilage Abnormal scattered gas signal intensities tracking proximally in the first, second, third, fourth, and residual fifth metatarsal and also in the cuneiform spur. Abnormal osseous edema in the fourth metatarsal distally and in the cuboid. There is only subtle accentuated enhancement distally in the fourth metatarsal in the region of the edema. Suspected enhancement in the cuboid and in patchy regions of the distal first metatarsal. Ligaments Lisfranc ligament obscured Muscles and Tendons Extensive abnormal gas density tracking along the musculotendinous structures of the forefoot. Regional muscular enhancement compatible with myositis. Soft  tissues Extensive subcutaneous edema and enhancement compatible with cellulitis. A focal well-defined abscess is not well seen. IMPRESSION: 1. Extensive abnormal gas in the soft tissues and bony structures of the right forefoot and distal midfoot, favoring gas-forming infection. This can be fulminant and may require further amputation. These results were called by telephone at the time of interpretation on  01/15/2016 at 5:24 pm to Dr. Lorin Mercy, who verbally acknowledged these results. Electronically Signed   By: Van Clines M.D.   On: 01/15/2016 17:24   Dg Chest Portable 1 View  Result Date: 01/14/2016 CLINICAL DATA:  Altered mental status.  Hyperglycemia. EXAM: PORTABLE CHEST 1 VIEW COMPARISON:  None. FINDINGS: A single AP portable view of the chest demonstrates no focal airspace consolidation or alveolar edema. The lungs are grossly clear. There is no large effusion or pneumothorax. Cardiac and mediastinal contours appear unremarkable. IMPRESSION: No active disease. Electronically Signed   By: Andreas Newport M.D.   On: 01/14/2016 22:16   Dg Abd Portable 1v  Result Date: 01/15/2016 CLINICAL DATA:  Abdominal pain. EXAM: PORTABLE ABDOMEN - 1 VIEW COMPARISON:  None. FINDINGS: Bowel gas pattern is nonobstructive. No evidence of free peritoneal air. Nonspecific calcific densities just left of the spine in the mid abdomen. Remaining bones and soft tissues are within normal. IMPRESSION: Nonobstructive bowel gas pattern. Electronically Signed   By: Marin Olp M.D.   On: 01/15/2016 08:18   Dg Foot 2 Views Right  Result Date: 01/15/2016 CLINICAL DATA:  Right foot infection. Fifth toe amputation 12/30/2015. EXAM: RIGHT FOOT - 2 VIEW COMPARISON:  None. FINDINGS: Evidence of patient's recent transmetatarsal amputation of the fifth toe. There is subcutaneous air over the plantar and dorsal soft tissues of the midfoot and forefoot which may be due to patient's diffuse soft tissue infection. Evidence of small vessel atherosclerotic disease. No definite bone destruction to suggest osteomyelitis. IMPRESSION: Evidence of recent fifth toe transmetatarsal amputation. Moderate air within the dorsal and plantar soft tissues of the mid to forefoot suggesting gas-forming infection. Electronically Signed   By: Marin Olp M.D.   On: 01/15/2016 11:52   US Abdomen Limited Ruq  Result Date: 01/15/2016 CLINICAL  DATA:  Hyperbilirubinemia, type I diabetes mellitus, hypertension EXAM: US ABDOMEN LIMITED - RIGHT UPPER QUADRANT COMPARISON:  None FINDINGS: Gallbladder: Normally distended with dependent sludge. No gallbladder wall thickening, pericholecystic fluid, definite calculi, or sonographic Murphy sign. Common bile duct: Diameter: Normal caliber 2 mm diameter Liver: Mildly heterogeneous. Hepatopetal portal venous flow. No definite hepatic mass or nodularity. No RIGHT upper quadrant ascites. Small RIGHT pleural effusion incidentally noted. IMPRESSION: Small none dependent sludge within gallbladder. No evidence of cholelithiasis, cholecystitis or biliary dilatation. Small RIGHT pleural effusion. Electronically Signed   By: Lavonia Dana M.D.   On: 01/15/2016 09:56    Review of Systems  Constitutional: Positive for chills, fever and malaise/fatigue.  Musculoskeletal:       12/30/15 5th ray amp  Neurological: Negative.   Endo/Heme/Allergies:       IDDM with A1C of 10-13.5 noted in chart poor control  Psychiatric/Behavioral: Negative.    Blood pressure 134/61, pulse (!) 102, temperature 100 F (37.8 C), temperature source Oral, resp. rate (!) 37, height 6' (1.829 m), weight 172 lb 13.5 oz (78.4 kg), SpO2 100 %. Physical Exam  Constitutional:  Thin chonically ill appearance. In ICU septic  GI: Soft.  Musculoskeletal:  5th ray right foot edge necrosis.   Psychiatric:  Responsive, slow in discussed. 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 Will do guillotine amputation and return in several days for revision to normal BKA if there is no further gas accending. Discussed with pt , sister and wife . ( wife by phone) questions answered. proceed Gordon Walker 01/15/2016, 5:37 PM

## 2016-01-18 NOTE — Brief Op Note (Signed)
01/14/2016 - 01/18/2016  7:19 PM  PATIENT:  Gordon Walker  50 y.o. male  PRE-OPERATIVE DIAGNOSIS:  gas gangrene  POST-OPERATIVE DIAGNOSIS:  gas gangrene  PROCEDURE:  Procedure(s): AMPUTATION BELOW KNEE (Right)  SURGEON:  Surgeon(s) and Role:    Eldred Manges* Jakaiden Fill C Dontell Mian, MD - Primary  PHYSICIAN ASSISTANT:   ASSISTANTS: none   ANESTHESIA:   general   EBL:  No intake/output data recorded.  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   Delay start of Pharmacological VTE agent (>24hrs) due to surgical blood loss or risk of bleeding: yes

## 2016-01-18 NOTE — Evaluation (Signed)
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  Has AKI, DKA, bacteremia and sepsis now with fever at his admission  Clinical Impression  Pt is working through his routine of there ex with RLE elevated pre and post exercises.  He is not having any pain and is very aware of elevation to maintain edema management and NWB on R stump.  Will continue to work on strengthening and transfers, gait as able and having the energy.    Follow Up Recommendations SNF    Equipment Recommendations  None recommended by PT    Recommendations for Other Services       Precautions / Restrictions Precautions Precautions: Fall Required Braces or Orthoses: Other Brace/Splint (L shoe with midfoot insert) Restrictions Weight Bearing Restrictions: Yes RLE Weight Bearing: Non weight bearing      Mobility  Bed Mobility Overal bed mobility: Modified Independent             General bed mobility comments: HOB elevated with increased time required  Transfers                    Ambulation/Gait                Stairs            Wheelchair Mobility    Modified Rankin (Stroke Patients Only)       Balance                                             Pertinent Vitals/Pain Pain Assessment: No/denies pain    Home Living                        Prior Function                 Hand Dominance        Extremity/Trunk Assessment                Communication      Cognition Arousal/Alertness: Awake/alert Behavior During Therapy: WFL for tasks assessed/performed Overall Cognitive Status: Within Functional Limits for tasks assessed                      General Comments      Exercises General Exercises - Lower Extremity Ankle  Circles/Pumps: AROM;Both;5 reps Quad Sets: AROM;Both;10 reps Gluteal Sets: AROM;Both;20 reps Heel Slides: AROM;Both;20 reps Hip ABduction/ADduction: AROM;Both;20 reps Straight Leg Raises: AROM;Both;20 reps Hip Flexion/Marching: AROM;Both;20 reps   Assessment/Plan    PT Assessment    PT Problem List            PT Treatment Interventions      PT Goals (Current goals can be found in the Care Plan section)       Frequency Min 4X/week   Barriers to discharge        Co-evaluation               End of Session   Activity Tolerance: Patient tolerated treatment well;Patient limited by fatigue Patient left: in bed;with call bell/phone within reach;with bed alarm set;with family/visitor present Nurse Communication: Mobility status  Time: [id number] PT Time Calculation (min) (ACUTE ONLY): 36 min   Charges:     PT Treatments $Therapeutic Exercise: 23-37 mins   PT G Codes:        Gordon Walker, Gordon Walker 01/18/2016, 2:35 PM   Gordon Walker, PT MS Acute Rehab [facility]. [phone] and [facility] [phone]

## 2016-01-18 NOTE — Progress Notes (Signed)
Pt's BP 147/71. MD notified. No new orders. Will continue to monitor.

## 2016-01-18 NOTE — Progress Notes (Signed)
At 2020, RN was called in room by family yelling that something was wrong. Upon entering the room RN noted that patient was unresponsive lying with eyes rolled back toward the top of his head. Staff members were called. Charge nurse alerted rapid response. Patient was put on non-rebreather face mask. The episode of complete  unresponsiveness lasted roughly 3 minutes. After 3 minutes he was opening his eyes to painful stimuli.. After Narcan was given at 2026 he began eye tracking. Around 2028, patient was alert and oriented x4.

## 2016-01-19 ENCOUNTER — Ambulatory Visit (INDEPENDENT_AMBULATORY_CARE_PROVIDER_SITE_OTHER): Payer: BLUE CROSS/BLUE SHIELD | Admitting: Orthopedic Surgery

## 2016-01-19 ENCOUNTER — Encounter (HOSPITAL_COMMUNITY): Payer: Self-pay | Admitting: Orthopaedic Surgery

## 2016-01-19 DIAGNOSIS — E1052 Type 1 diabetes mellitus with diabetic peripheral angiopathy with gangrene: Secondary | ICD-10-CM

## 2016-01-19 DIAGNOSIS — Z89511 Acquired absence of right leg below knee: Secondary | ICD-10-CM

## 2016-01-19 DIAGNOSIS — D72829 Elevated white blood cell count, unspecified: Secondary | ICD-10-CM

## 2016-01-19 LAB — COMPREHENSIVE METABOLIC PANEL
ALBUMIN: 1.5 g/dL — AB (ref 3.5–5.0)
ALT: 134 U/L — ABNORMAL HIGH (ref 17–63)
AST: 284 U/L — AB (ref 15–41)
Alkaline Phosphatase: 346 U/L — ABNORMAL HIGH (ref 38–126)
Anion gap: 3 — ABNORMAL LOW (ref 5–15)
BILIRUBIN TOTAL: 1.2 mg/dL (ref 0.3–1.2)
BUN: 12 mg/dL (ref 6–20)
CO2: 29 mmol/L (ref 22–32)
CREATININE: 0.96 mg/dL (ref 0.61–1.24)
Calcium: 7.6 mg/dL — ABNORMAL LOW (ref 8.9–10.3)
Chloride: 102 mmol/L (ref 101–111)
GFR calc Af Amer: >60 mL/min (ref >60)
GLUCOSE: 103 mg/dL — AB (ref 65–99)
POTASSIUM: 3.5 mmol/L (ref 3.5–5.1)
Sodium: 134 mmol/L — ABNORMAL LOW (ref 135–145)
TOTAL PROTEIN: 5.4 g/dL — AB (ref 6.5–8.1)

## 2016-01-19 LAB — CBC
HCT: 26 % — ABNORMAL LOW (ref 39.0–52.0)
Hemoglobin: 8.7 g/dL — ABNORMAL LOW (ref 13.0–17.0)
MCH: 27.8 pg (ref 26.0–34.0)
MCHC: 33.5 g/dL (ref 30.0–36.0)
MCV: 83.1 fL (ref 78.0–100.0)
Platelets: 305 10*3/uL (ref 150–400)
RBC: 3.13 MIL/uL — ABNORMAL LOW (ref 4.22–5.81)
RDW: 13.1 % (ref 11.5–15.5)
WBC: 20.8 10*3/uL — ABNORMAL HIGH (ref 4.0–10.5)

## 2016-01-19 LAB — HEPATITIS PANEL, ACUTE
HEP A IGM: NEGATIVE
HEP B C IGM: NEGATIVE
Hepatitis B Surface Ag: NEGATIVE

## 2016-01-19 LAB — IRON AND TIBC
IRON: 15 ug/dL — AB (ref 45–182)
SATURATION RATIOS: 11 % — AB (ref 17.9–39.5)
TIBC: 141 ug/dL — ABNORMAL LOW (ref 250–450)
UIBC: 126 ug/dL

## 2016-01-19 LAB — MRSA CULTURE: Culture: DETECTED

## 2016-01-19 LAB — GLUCOSE, CAPILLARY
GLUCOSE-CAPILLARY: 170 mg/dL — AB (ref 65–99)
GLUCOSE-CAPILLARY: 207 mg/dL — AB (ref 65–99)
Glucose-Capillary: 175 mg/dL — ABNORMAL HIGH (ref 65–99)
Glucose-Capillary: 237 mg/dL — ABNORMAL HIGH (ref 65–99)

## 2016-01-19 NOTE — H&P (Signed)
Physical Medicine and Rehabilitation Admission H&P    Chief Complaint  Patient presents with  . Hyperglycemia  : HPI: Gordon Walker is a 50 y.o. 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 Per chart review patient lives with wife. Independent prior to admission. Wife works second shift. 2 level home with bedroom upstairs. Very limited family support. Presented 01/14/2016 with ongoing bouts of nausea and dry heaving with associated epigastric pain . Noted creatinine 2.40 with baseline 1.36. WBC 24,800. Found to be in DKA. Blood cultures Escherichia coli bacteremia Placed on broad-spectrum antibiotics simplified Ancef. IV insulin therapy. Follow-up orthopedic service Dr. 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 Hospital course pain management. Rapid response 01/18/2016 reported be unresponsive patient did receive Narcan and mental status improved. Subcutaneous Lovenox for DVT prophylaxis. Acute on chronic anemia 7.0 and Transfused. Hyponatremia 129 and monitored. Physical and occupational therapy evaluations completed with recommendations of physical medicine rehabilitation consult.Patient was admitted for a comprehensive rehabilitation program  Review of Systems  Constitutional: Positive for fever. Negative for chills.  HENT: Negative for ear pain, hearing loss and tinnitus.   Respiratory: Positive for shortness of breath. Negative for cough.   Cardiovascular: Positive for leg swelling. Negative for chest pain and palpitations.  Gastrointestinal: Positive for constipation, nausea and vomiting.       GERD  Genitourinary: Positive for urgency. Negative for dysuria and hematuria.  Musculoskeletal: Positive for joint pain and myalgias.  Skin: Negative for rash.    Neurological: Negative for seizures and weakness.  All other systems reviewed and are negative.  Past Medical History:  Diagnosis Date  . Diabetes mellitus without complication (HCC)    Type 1  . Heart murmur    "years ago"  . Hypertension    "years ago"  . 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, with long-term current use of insulin [facility])    Past Surgical History:  Procedure Laterality Date  . AMPUTATION Left 01/07/2015   Procedure: Left Foot 1st Ray Amputation;  Surgeon: Newt Minion, MD;  Location: [place];  Service: Orthopedics;  Laterality: Left;  . AMPUTATION Left 07/15/2015   Procedure: Left Transmetatarsal Amputation;  Surgeon: Newt Minion, MD;  Location: [place];  Service: Orthopedics;  Laterality: Left;  . AMPUTATION Right 12/30/2015   Procedure: 5th Ray Amputation Right Foot;  Surgeon: Newt Minion, MD;  Location: [place];  Service: Orthopedics;  Laterality: Right;  . AMPUTATION Right 01/15/2016   Procedure: GUILLOTINE AMPUTATION  ABOVE THE ANKLE AMPUTATION;  Surgeon: Marybelle Killings, MD;  Location: [place];  Service: Orthopedics;  Laterality: Right;  . AMPUTATION Right 01/18/2016   Procedure: AMPUTATION BELOW KNEE;  Surgeon: Marybelle Killings, MD;  Location: [place];  Service: Orthopedics;  Laterality: Right;   Family History  Problem Relation Age of Onset  . Hypertension Mother   . Diabetes Mother   . Cancer Mother   . Prostate cancer Father    Social History:  reports that he has never smoked. He has never used smokeless tobacco. He reports that he does not drink alcohol or use drugs. Allergies:  Allergies  Allergen Reactions  . No Known Allergies    Medications Prior to Admission  Medication Sig Dispense Refill  . aspirin EC 81 MG tablet  Take 81 mg by mouth daily.    Marland Kitchen atorvastatin (LIPITOR) 20 MG tablet Take 20 mg by mouth daily.    Marland Kitchen glucagon 1 MG injection Inject 1 mg into the muscle once as needed. (Patient taking differently: Inject  1 mg into the muscle daily as needed (hypoglycemia). ) 1 each 12  . HYDROcodone-acetaminophen (NORCO/VICODIN) 5-325 MG tablet Take 1 tablet by mouth every 6 (six) hours as needed for moderate pain.    Marland Kitchen insulin aspart (NOVOLOG FLEXPEN) 100 UNIT/ML FlexPen Inject 9 Units into the skin 3 (three) times daily with meals. And pen needles 3/day 15 mL 11  . insulin glargine (LANTUS) 100 UNIT/ML injection Inject 0.27 mLs (27 Units total) into the skin at bedtime. Sliding scale (Patient taking differently: Inject 0-27 Units into the skin at bedtime. Sliding scale) 10 mL 11  . lisinopril (PRINIVIL,ZESTRIL) 10 MG tablet Take 10 mg by mouth daily.     . Multiple Vitamin (MULTIVITAMIN WITH MINERALS) TABS tablet Take 1 tablet by mouth daily.    . Polyvinyl Alcohol (LUBRICANT DROPS OP) Apply 1 drop to eye daily as needed (dry eyes).      Home: Home Living Family/patient expects to be discharged to:: Skilled nursing facility Living Arrangements: Spouse/significant other Available Help at Discharge: Family, Available PRN/intermittently Type of Home: House Home Access: Stairs to enter [employer] of Steps: 4 Entrance Stairs-Rails: Right, Left, Can reach both Home Layout: Two level Alternate Level Stairs-Number of Steps: flight Alternate Level Stairs-Rails: Left Bathroom Toilet: Standard Home Equipment: Walker - 2 wheels, Other (comment), Crutches (L shoe insert)   Functional History: Prior Function Level of Independence: Independent with assistive device(s) Comments: crutches or RW for mobility, independent with ADL  Functional Status:  Mobility: Bed Mobility Overal bed mobility: Needs Assistance Bed Mobility: Supine to Sit Supine to sit: Min assist General bed mobility comments: Increased time but pt able to manage LEs to EOB. Min hand held assist for trunk elevation to sitting and to scoot hips out to EOB. HOB flat with use of bed rails. Transfers Overall transfer level: Needs  assistance Equipment used: Rolling walker (2 wheeled) Transfers: Sit to/from Stand, [employer] Transfers Sit to Stand: Mod assist Stand pivot transfers: Min assist General transfer comment: Mod assist to boost up from EOB and for stand pivot to chair. Pt unsteady on L foot; reports he is typically more steady with L shoe insert that he uses at home. Ambulation/Gait Ambulation/Gait assistance: Min assist Ambulation Distance (Feet): 2 Feet Assistive device: Rolling walker (2 wheeled), 1 person hand held assist Gait Pattern/deviations: Narrow base of support (pivot shift on LLE to chair)    ADL: ADL Overall ADL's : Needs assistance/impaired Eating/Feeding: Independent, Sitting Grooming: Supervision/safety, Sitting Upper Body Bathing: Set up, Supervision/ safety, Sitting Lower Body Bathing: Moderate assistance, Sit to/from stand Upper Body Dressing : Set up, Supervision/safety, Sitting Lower Body Dressing: Moderate assistance, Sit to/from stand Lower Body Dressing Details (indicate cue type and reason): Pt able to adjust sock on L foot sitting EOB with supervision.  Toilet Transfer: Moderate assistance, Stand-pivot, RW Toilet Transfer Details (indicate cue type and reason): Simulated by stand pivot to chair. Mod assist for sit to stand and min assist for stand pivot. Functional mobility during ADLs: Moderate assistance, Rolling walker (for stand pivot only) General ADL Comments: Pt with increased pain today; declining participation in ADL but agreeable to OOB to chair.  Cognition: Cognition Overall Cognitive Status: Within Functional Limits for tasks assessed Orientation Level: Oriented X4  Cognition Arousal/Alertness: Awake/alert Behavior During Therapy: WFL for tasks assessed/performed Overall Cognitive Status: Within Functional Limits for tasks assessed  Physical Exam: Blood pressure (!) 143/85, pulse 88, temperature 98.5 F (36.9 C), temperature source Oral, resp. rate 17,  height 6' (1.829 m), weight 78.2 kg (172 lb 4.8 oz), SpO2 100 %. Physical Exam  Constitutional: He appears well-developed.  HENT:  Head: Normocephalic.  Eyes: EOM are normal.  Neck: Normal range of motion. Neck supple. No tracheal deviation present. No thyromegaly present.  Cardiovascular: Normal rate, regular rhythm and normal heart sounds.   Respiratory: Effort normal and breath sounds normal. No respiratory distress.  GI: Soft. Bowel sounds are normal. He exhibits no distension. There is no tenderness.  Musculoskeletal:  Right bka dressed with serosanguinous discharge.  Left TMA intact.  Neurological: He is alert and oriented to person, place, and time. No cranial nerve deficit. Coordination normal.  Follows full commands. Bilateral UE 5/5. RLE limited by pain although he can lift against gravity. LLE grossly 4-5/5. Senses pain and gross touch in left foot/leg Skin:  Callus left lateral foot without breakdown Psych: pleasant and cooperative   Results for orders placed or performed during the hospital encounter of 01/14/16 (from the past 48 hour(s))  Glucose, capillary     Status: Abnormal   Collection Time: 01/17/16  5:12 PM  Result Value Ref Range   Glucose-Capillary 192 (H) 65 - 99 mg/dL  Surgical pcr screen     Status: Abnormal   Collection Time: 01/17/16  6:47 PM  Result Value Ref Range   MRSA, PCR INVALID RESULTS, SPECIMEN SENT FOR CULTURE (A) NEGATIVE    Comment: RESULT CALLED TO, READ BACK BY AND VERIFIED WITH: Q LOFTIN,RN @0103  01/18/16 MKELLY,MLT    Staphylococcus aureus INVALID RESULTS, SPECIMEN SENT FOR CULTURE (A) NEGATIVE    Comment:        The Xpert SA Assay (FDA approved for NASAL specimens in patients over 40 years of age), is one component of a comprehensive surveillance program.  Test performance has been validated by [facility] for patients greater than or equal to 46 year old. It is not intended to diagnose infection nor to guide or monitor  treatment.   MRSA culture     Status: None (Preliminary result)   Collection Time: 01/17/16  6:47 PM  Result Value Ref Range   Specimen Description NASAL SWAB    Special Requests NONE    Culture CULTURE REINCUBATED FOR BETTER GROWTH    Report Status PENDING   Glucose, capillary     Status: Abnormal   Collection Time: 01/17/16  9:42 PM  Result Value Ref Range   Glucose-Capillary 187 (H) 65 - 99 mg/dL  Glucose, capillary     Status: Abnormal   Collection Time: 01/18/16 12:03 AM  Result Value Ref Range   Glucose-Capillary 176 (H) 65 - 99 mg/dL  Comprehensive metabolic panel     Status: Abnormal   Collection Time: 01/18/16  6:44 AM  Result Value Ref Range   Sodium 134 (L) 135 - 145 mmol/L   Potassium 3.5 3.5 - 5.1 mmol/L   Chloride 100 (L) 101 - 111 mmol/L   CO2 27 22 - 32 mmol/L   Glucose, Bld 200 (H) 65 - 99 mg/dL   BUN 16 6 - 20 mg/dL   Creatinine, Ser 0.99 0.61 - 1.24 mg/dL   Calcium 7.6 (L) 8.9 - 10.3 mg/dL   Total Protein 5.6 (L) 6.5 - 8.1 g/dL   Albumin 1.4 (L)  3.5 - 5.0 g/dL   AST 188 (H) 15 - 41 U/L   ALT 98 (H) 17 - 63 U/L   Alkaline Phosphatase 286 (H) 38 - 126 U/L   Total Bilirubin 1.0 0.3 - 1.2 mg/dL   GFR calc non Af Amer >60 >60 mL/min   GFR calc Af Amer >60 >60 mL/min    Comment: (NOTE) The eGFR has been calculated using the CKD EPI equation. This calculation has not been validated in all clinical situations. eGFR's persistently <60 mL/min signify possible Chronic Kidney Disease.    Anion gap 7 5 - 15  CBC     Status: Abnormal   Collection Time: 01/18/16  6:44 AM  Result Value Ref Range   WBC 18.4 (H) 4.0 - 10.5 K/uL   RBC 3.02 (L) 4.22 - 5.81 MIL/uL   Hemoglobin 8.2 (L) 13.0 - 17.0 g/dL   HCT 24.2 (L) 39.0 - 52.0 %   MCV 80.1 78.0 - 100.0 fL   MCH 27.2 26.0 - 34.0 pg   MCHC 33.9 30.0 - 36.0 g/dL   RDW 12.4 11.5 - 15.5 %   Platelets 288 150 - 400 K/uL  Phosphorus     Status: None   Collection Time: 01/18/16  6:44 AM  Result Value Ref Range    Phosphorus 2.7 2.5 - 4.6 mg/dL  Glucose, capillary     Status: Abnormal   Collection Time: 01/18/16  7:48 AM  Result Value Ref Range   Glucose-Capillary 187 (H) 65 - 99 mg/dL  Glucose, capillary     Status: Abnormal   Collection Time: 01/18/16 11:51 AM  Result Value Ref Range   Glucose-Capillary 216 (H) 65 - 99 mg/dL  Hepatitis panel, acute     Status: None   Collection Time: 01/18/16  3:02 PM  Result Value Ref Range   Hepatitis B Surface Ag Negative Negative   HCV Ab <0.1 0.0 - 0.9 s/co ratio    Comment: (NOTE)                                  Negative:     < 0.8                             Indeterminate: 0.8 - 0.9                                  Positive:     > 0.9 The CDC recommends that a positive HCV antibody result be followed up with a HCV Nucleic Acid Amplification test (370488). Performed At: [facility] [place], [place] [id number] Lindon Romp MD UU:[id number]    Hep A IgM Negative Negative   Hep B C IgM Negative Negative  Type and screen [place]     Status: None   Collection Time: 01/18/16  3:04 PM  Result Value Ref Range   ISSUE DATE / TIME [id number]    Blood Product Unit Number [id number]    PRODUCT CODE [id number]    Unit Type and Rh 5100    Blood Product Expiration Date [id number]   ABO/Rh     Status: None   Collection Time: 01/18/16  3:04 PM  Result Value Ref Range   ABO/RH(D) O POS   Prepare RBC  Status: None   Collection Time: 01/18/16  3:15 PM  Result Value Ref Range   Order Confirmation      BB SAMPLE OR UNITS ALREADY AVAILABLE ONLY 1 UNIT NEEDED PER RN  Prepare RBC     Status: None   Collection Time: 01/18/16  3:22 PM  Result Value Ref Range   Order Confirmation ORDER PROCESSED BY BLOOD BANK   Glucose, capillary     Status: None   Collection Time: 01/18/16  5:08 PM  Result Value Ref Range   Glucose-Capillary 80 65 - 99 mg/dL  Glucose, capillary     Status: None   Collection Time:  01/18/16  7:22 PM  Result Value Ref Range   Glucose-Capillary 92 65 - 99 mg/dL  Glucose, capillary     Status: Abnormal   Collection Time: 01/18/16  8:29 PM  Result Value Ref Range   Glucose-Capillary 144 (H) 65 - 99 mg/dL  Comprehensive metabolic panel     Status: Abnormal   Collection Time: 01/19/16  5:15 AM  Result Value Ref Range   Sodium 134 (L) 135 - 145 mmol/L   Potassium 3.5 3.5 - 5.1 mmol/L   Chloride 102 101 - 111 mmol/L   CO2 29 22 - 32 mmol/L   Glucose, Bld 103 (H) 65 - 99 mg/dL   BUN 12 6 - 20 mg/dL   Creatinine, Ser 0.96 0.61 - 1.24 mg/dL   Calcium 7.6 (L) 8.9 - 10.3 mg/dL   Total Protein 5.4 (L) 6.5 - 8.1 g/dL   Albumin 1.5 (L) 3.5 - 5.0 g/dL   AST 284 (H) 15 - 41 U/L   ALT 134 (H) 17 - 63 U/L   Alkaline Phosphatase 346 (H) 38 - 126 U/L   Total Bilirubin 1.2 0.3 - 1.2 mg/dL   GFR calc non Af Amer >60 >60 mL/min   GFR calc Af Amer >60 >60 mL/min    Comment: (NOTE) The eGFR has been calculated using the CKD EPI equation. This calculation has not been validated in all clinical situations. eGFR's persistently <60 mL/min signify possible Chronic Kidney Disease.    Anion gap 3 (L) 5 - 15  CBC     Status: Abnormal   Collection Time: 01/19/16  5:15 AM  Result Value Ref Range   WBC 20.8 (H) 4.0 - 10.5 K/uL   RBC 3.13 (L) 4.22 - 5.81 MIL/uL   Hemoglobin 8.7 (L) 13.0 - 17.0 g/dL   HCT 26.0 (L) 39.0 - 52.0 %   MCV 83.1 78.0 - 100.0 fL   MCH 27.8 26.0 - 34.0 pg   MCHC 33.5 30.0 - 36.0 g/dL   RDW 13.1 11.5 - 15.5 %   Platelets 305 150 - 400 K/uL  Glucose, capillary     Status: Abnormal   Collection Time: 01/19/16  9:00 AM  Result Value Ref Range   Glucose-Capillary 170 (H) 65 - 99 mg/dL  Glucose, capillary     Status: Abnormal   Collection Time: 01/19/16 12:15 PM  Result Value Ref Range   Glucose-Capillary 175 (H) 65 - 99 mg/dL   No results found.     Medical Problem List and Plan: 1.  Decreased functional mobility secondary to right BKA  01/18/2016/Escherichia coli bacteremia as well as history of left TMA 23 2017.   -admit to inpatient rehab 2.  DVT Prophylaxis/Anticoagulation: Subcutaneous Lovenox. Monitor platelet counts and any signs of bleeding 3. Pain Management: Hydrocodone as needed 4. Acute blood loss anemia. Follow-up CBC over the  next couple days.  5. Neuropsych: This patient is capable of making decisions on his own behalf. 6. Skin/Wound Care: Routine skin checks 7. Fluids/Electrolytes/Nutrition: Routine I&O with follow-up chemistries 8. Diabetes mellitus with peripheral neuropathy. Hemoglobin A1c 8.2. NovoLog 4 units 3 times a day with meals, Lantus insulin 30 units daily at bedtime. Check blood sugars before meals and at bedtime. Diabetic teaching 9. Constipation. Laxative assistance 10. ID: pt continues on Ancef    Post Admission Physician Evaluation: 1. Functional deficits secondary  to right BKA. 2. Patient is admitted to receive collaborative, interdisciplinary care between the physiatrist, rehab nursing staff, and therapy team. 3. Patient's level of medical complexity and substantial therapy needs in context of that medical necessity cannot be provided at a lesser intensity of care such as a SNF. 4. Patient has experienced substantial functional loss from his/her baseline which was documented above under the "Functional History" and "Functional Status" headings.  Judging by the patient's diagnosis, physical exam, and functional history, the patient has potential for functional progress which will result in measurable gains while on inpatient rehab.  These gains will be of substantial and practical use upon discharge  in facilitating mobility and self-care at the household level. 5. Physiatrist will provide 24 hour management of medical needs as well as oversight of the therapy plan/treatment and provide guidance as appropriate regarding the interaction of the two. 6. The Preadmission Screening has been reviewed  and patient status is unchanged unless otherwise stated above. 7. 24 hour rehab nursing will assist with bladder management, bowel management, safety, skin/wound care, disease management, medication administration, pain management and patient education  and help integrate therapy concepts, techniques,education, etc. 8. PT will assess and treat for/with: Lower extremity strength, range of motion, stamina, balance, functional mobility, safety, adaptive techniques and equipment, pain mgt, prosthetic education, w/c use, potentially short distance gait.   Goals are: mod I. 9. OT will assess and treat for/with: ADL's, functional mobility, safety, upper extremity strength, adaptive techniques and equipment, pain mgt, ego support, community reintegration.   Goals are: mod I. Therapy may proceed with showering this patient. 10. SLP will assess and treat for/with: n/a.  Goals are: n/a. 11. Case Management and Social Worker will assess and treat for psychological issues and discharge planning. 12. Team conference will be held weekly to assess progress toward goals and to determine barriers to discharge. 13. Patient will receive at least 3 hours of therapy per day at least 5 days per week. 14. ELOS: 7 days       15. Prognosis:  excellent     Meredith Staggers, MD, [place] [facility] 01/20/2016  Cathlyn Parsons., PA-C 01/19/2016

## 2016-01-19 NOTE — Consult Note (Signed)
Physical Medicine and Rehabilitation Consult Reason for Consult: Right BKA Referring Physician: [facility]   HPI: Gordon Walker is a 50 y.o. right handed male with history of diabetes mellitus peripheral neuropathy, hypertension, PVD osteomyelitis right fifth toe partial amputation and right foot 12/30/2015. Per chart review patient lives with wife. Independent prior to admission. Wife works second shift. 2 level home with bedroom upstairs. Very limited family support. Presented 01/15/2016 with ongoing bouts of nausea and dry heaving with associated epigastric pain . Noted creatinine 2.40 with baseline 1.36. WBC 24,800. Found to be in DKA. Placed on broad-spectrum antibiotics. IV insulin therapy. Follow-up orthopedic service Dr. 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 Hospital course pain management. Rapid response 01/18/2016 reported be unresponsive patient did receive Narcan and mental status improved. Subcutaneous Lovenox for DVT prophylaxis. Acute on chronic anemia 8.7 and monitored. Physical and occupational therapy evaluations completed with recommendations of physical medicine rehabilitation consult.   Review of Systems  Constitutional: Negative for chills and fever.  HENT: Negative for hearing loss and tinnitus.   Eyes: Negative for blurred vision and double vision.  Respiratory: Negative for cough and shortness of breath.   Cardiovascular: Positive for leg swelling. Negative for chest pain and palpitations.  Gastrointestinal: Positive for constipation. Negative for nausea and vomiting.  Genitourinary: Positive for urgency. Negative for dysuria and hematuria.  Musculoskeletal: Positive for myalgias. Negative for joint pain.  Skin: Negative for rash.  Neurological: Positive for weakness. Negative for seizures and loss of consciousness.  All other systems reviewed and are negative.  Past Medical  History:  Diagnosis Date  . Diabetes mellitus without complication (HCC)    Type 1  . Heart murmur    "years ago"  . Hypertension    "years ago"  . 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 AMPUTATION Left 01/07/2015   Procedure: Left Foot 1st Ray Amputation;  Surgeon: Nadara Mustard, MD;  Location: [facility] OR;  Service: Orthopedics;  Laterality: Left;  . AMPUTATION Left 07/15/2015   Procedure: Left Transmetatarsal Amputation;  Surgeon: Nadara Mustard, MD;  Location: [facility] OR;  Service: Orthopedics;  Laterality: Left;  . AMPUTATION Right 12/30/2015   Procedure: 5th Ray Amputation Right Foot;  Surgeon: Nadara Mustard, MD;  Location: [facility] OR;  Service: Orthopedics;  Laterality: Right;  . AMPUTATION Right 01/15/2016   Procedure: GUILLOTINE AMPUTATION  ABOVE THE ANKLE AMPUTATION;  Surgeon: Eldred Manges, MD;  Location: [facility] OR;  Service: Orthopedics;  Laterality: Right;  . AMPUTATION Right 01/18/2016   Procedure: AMPUTATION BELOW KNEE;  Surgeon: Eldred Manges, MD;  Location: [facility] OR;  Service: Orthopedics;  Laterality: Right;   Family History  Problem Relation Age of Onset  . Hypertension Mother   . Diabetes Mother   . Cancer Mother   . Prostate cancer Father    Social History:  reports that he has never smoked. He has never used smokeless tobacco. He reports that he does not drink alcohol or use drugs. Allergies:  Allergies  Allergen Reactions  . No Known Allergies    Medications Prior to Admission  Medication Sig Dispense Refill  . aspirin EC 81 MG tablet Take 81 mg by mouth daily.    Marland Kitchen atorvastatin (LIPITOR) 20 MG tablet Take 20 mg by mouth daily.    Marland Kitchen  glucagon 1 MG injection Inject 1 mg into the muscle once as needed. (Patient taking differently: Inject 1 mg into the muscle daily as needed (hypoglycemia). ) 1 each 12  . HYDROcodone-acetaminophen (NORCO/VICODIN)  5-325 MG tablet Take 1 tablet by mouth every 6 (six) hours as needed for moderate pain.    Marland Kitchen insulin aspart (NOVOLOG FLEXPEN) 100 UNIT/ML FlexPen Inject 9 Units into the skin 3 (three) times daily with meals. And pen needles 3/day 15 mL 11  . insulin glargine (LANTUS) 100 UNIT/ML injection Inject 0.27 mLs (27 Units total) into the skin at bedtime. Sliding scale (Patient taking differently: Inject 0-27 Units into the skin at bedtime. Sliding scale) 10 mL 11  . lisinopril (PRINIVIL,ZESTRIL) 10 MG tablet Take 10 mg by mouth daily.     . Multiple Vitamin (MULTIVITAMIN WITH MINERALS) TABS tablet Take 1 tablet by mouth daily.    . Polyvinyl Alcohol (LUBRICANT DROPS OP) Apply 1 drop to eye daily as needed (dry eyes).      Home: Home Living Family/patient expects to be discharged to:: Skilled nursing facility Living Arrangements: Spouse/significant other Available Help at Discharge: Family, Available PRN/intermittently Type of Home: House Home Access: Stairs to enter [employer] of Steps: 4 Entrance Stairs-Rails: Right, Left, Can reach both Home Layout: Two level Alternate Level Stairs-Number of Steps: flight Alternate Level Stairs-Rails: Left Bathroom Toilet: Standard Home Equipment: Walker - 2 wheels, Other (comment), Crutches (L shoe insert)  Functional History: Prior Function Level of Independence: Independent with assistive device(s) Comments: crutches or RW for mobility, independent with ADL Functional Status:  Mobility: Bed Mobility Overal bed mobility: Needs Assistance Bed Mobility: Supine to Sit Supine to sit: Min assist General bed mobility comments: Increased time but pt able to manage LEs to EOB. Min hand held assist for trunk elevation to sitting and to scoot hips out to EOB. HOB flat with use of bed rails. Transfers Overall transfer level: Needs assistance Equipment used: Rolling walker (2 wheeled) Transfers: Sit to/from Stand, [employer] Transfers Sit to Stand:  Mod assist Stand pivot transfers: Min assist General transfer comment: Mod assist to boost up from EOB and for stand pivot to chair. Pt unsteady on L foot; reports he is typically more steady with L shoe insert that he uses at home. Ambulation/Gait Ambulation/Gait assistance: Min assist Ambulation Distance (Feet): 2 Feet Assistive device: Rolling walker (2 wheeled), 1 person hand held assist Gait Pattern/deviations: Narrow base of support (pivot shift on LLE to chair)    ADL: ADL Overall ADL's : Needs assistance/impaired Eating/Feeding: Independent, Sitting Grooming: Supervision/safety, Sitting Upper Body Bathing: Set up, Supervision/ safety, Sitting Lower Body Bathing: Moderate assistance, Sit to/from stand Upper Body Dressing : Set up, Supervision/safety, Sitting Lower Body Dressing: Moderate assistance, Sit to/from stand Lower Body Dressing Details (indicate cue type and reason): Pt able to adjust sock on L foot sitting EOB with supervision.  Toilet Transfer: Moderate assistance, Stand-pivot, RW Toilet Transfer Details (indicate cue type and reason): Simulated by stand pivot to chair. Mod assist for sit to stand and min assist for stand pivot. Functional mobility during ADLs: Moderate assistance, Rolling walker (for stand pivot only) General ADL Comments: Pt with increased pain today; declining participation in ADL but agreeable to OOB to chair.  Cognition: Cognition Overall Cognitive Status: Within Functional Limits for tasks assessed Orientation Level: Oriented X4 Cognition Arousal/Alertness: Awake/alert Behavior During Therapy: WFL for tasks assessed/performed Overall Cognitive Status: Within Functional Limits for tasks assessed  Blood pressure (!) 143/85, pulse 88, temperature  98.5 F (36.9 C), temperature source Oral, resp. rate 17, height 6' (1.829 m), weight 78.2 kg (172 lb 4.8 oz), SpO2 100 %. Physical Exam  Vitals reviewed. Constitutional: He is oriented to person,  place, and time. He appears well-developed.  HENT:  Head: Normocephalic.  Eyes: EOM are normal.  Neck: Normal range of motion. Neck supple. No thyromegaly present.  Cardiovascular: Normal rate and regular rhythm.   Respiratory: Effort normal and breath sounds normal. No respiratory distress. He has no wheezes.  GI: Soft. Bowel sounds are normal. He exhibits no distension.  Musculoskeletal:  Right bka dressed. Left TMA intact.  Neurological: He is alert and oriented to person, place, and time. No cranial nerve deficit. Coordination normal.  Follows full commands. UE 5/5. RLE limited by pain. LLE grossly 4-5/5. Senses pain left foot  Skin:  BKA site is dressed appropriately tender. Callus left lateral foot without breakdown    Results for orders placed or performed during the hospital encounter of 01/14/16 (from the past 24 hour(s))  Hepatitis panel, acute     Status: None   Collection Time: 01/18/16  3:02 PM  Result Value Ref Range   Hepatitis B Surface Ag Negative Negative   HCV Ab <0.1 0.0 - 0.9 s/co ratio   Hep A IgM Negative Negative   Hep B C IgM Negative Negative  Type and screen  [facility]     Status: None   Collection Time: 01/18/16  3:04 PM  Result Value Ref Range   ISSUE DATE / TIME [id number]    Blood Product Unit Number [id number]    PRODUCT CODE [id number]    Unit Type and Rh 5100    Blood Product Expiration Date [id number]   ABO/Rh     Status: None   Collection Time: 01/18/16  3:04 PM  Result Value Ref Range   ABO/RH(D) O POS   Prepare RBC     Status: None   Collection Time: 01/18/16  3:15 PM  Result Value Ref Range   Order Confirmation      BB SAMPLE OR UNITS ALREADY AVAILABLE ONLY 1 UNIT NEEDED PER RN  Prepare RBC     Status: None   Collection Time: 01/18/16  3:22 PM  Result Value Ref Range   Order Confirmation ORDER PROCESSED BY BLOOD BANK   Glucose, capillary     Status: None   Collection Time: 01/18/16  5:08 PM  Result Value Ref  Range   Glucose-Capillary 80 65 - 99 mg/dL  Glucose, capillary     Status: None   Collection Time: 01/18/16  7:22 PM  Result Value Ref Range   Glucose-Capillary 92 65 - 99 mg/dL  Glucose, capillary     Status: Abnormal   Collection Time: 01/18/16  8:29 PM  Result Value Ref Range   Glucose-Capillary 144 (H) 65 - 99 mg/dL  Comprehensive metabolic panel     Status: Abnormal   Collection Time: 01/19/16  5:15 AM  Result Value Ref Range   Sodium 134 (L) 135 - 145 mmol/L   Potassium 3.5 3.5 - 5.1 mmol/L   Chloride 102 101 - 111 mmol/L   CO2 29 22 - 32 mmol/L   Glucose, Bld 103 (H) 65 - 99 mg/dL   BUN 12 6 - 20 mg/dL   Creatinine, Ser 2.440.96 0.61 - 1.24 mg/dL   Calcium 7.6 (L) 8.9 - 10.3 mg/dL   Total Protein 5.4 (L) 6.5 - 8.1 g/dL   Albumin 1.5 (L) 3.5 -  5.0 g/dL   AST 782284 (H) 15 - 41 U/L   ALT 134 (H) 17 - 63 U/L   Alkaline Phosphatase 346 (H) 38 - 126 U/L   Total Bilirubin 1.2 0.3 - 1.2 mg/dL   GFR calc non Af Amer >60 >60 mL/min   GFR calc Af Amer >60 >60 mL/min   Anion gap 3 (L) 5 - 15  CBC     Status: Abnormal   Collection Time: 01/19/16  5:15 AM  Result Value Ref Range   WBC 20.8 (H) 4.0 - 10.5 K/uL   RBC 3.13 (L) 4.22 - 5.81 MIL/uL   Hemoglobin 8.7 (L) 13.0 - 17.0 g/dL   HCT 95.626.0 (L) 21.339.0 - 08.652.0 %   MCV 83.1 78.0 - 100.0 fL   MCH 27.8 26.0 - 34.0 pg   MCHC 33.5 30.0 - 36.0 g/dL   RDW 57.813.1 46.911.5 - 62.915.5 %   Platelets 305 150 - 400 K/uL  Glucose, capillary     Status: Abnormal   Collection Time: 01/19/16  9:00 AM  Result Value Ref Range   Glucose-Capillary 170 (H) 65 - 99 mg/dL   No results found.  Assessment/Plan: Diagnosis: Right BKA pod #1, hx of left TMA 1. Does the need for close, 24 hr/day medical supervision in concert with the patient's rehab needs make it unreasonable for this patient to be served in a less intensive setting? Yes 2. Co-Morbidities requiring supervision/potential complications: dm1, chronic wound issues, pain 3. Due to bladder management, bowel  management, safety, skin/wound care, disease management, medication administration, pain management and patient education, does the patient require 24 hr/day rehab nursing? Yes 4. Does the patient require coordinated care of a physician, rehab nurse, PT (1-2 hrs/day, 5 days/week) and OT (1-2 hrs/day, 5 days/week) to address physical and functional deficits in the context of the above medical diagnosis(es)? Yes Addressing deficits in the following areas: balance, endurance, locomotion, strength, transferring, bowel/bladder control, bathing, dressing, feeding, grooming, toileting and psychosocial support 5. Can the patient actively participate in an intensive therapy program of at least 3 hrs of therapy per day at least 5 days per week? Yes 6. The potential for patient to make measurable gains while on inpatient rehab is excellent 7. Anticipated functional outcomes upon discharge from inpatient rehab are modified independent  with PT, modified independent with OT, n/a with SLP. 8. Estimated rehab length of stay to reach the above functional goals is: 7 days 9. Does the patient have adequate social supports and living environment to accommodate these discharge functional goals? Yes 10. Anticipated D/C setting: Home 11. Anticipated post D/C treatments: HH therapy 12. Overall Rehab/Functional Prognosis: excellent  RECOMMENDATIONS: This patient's condition is appropriate for continued rehabilitative care in the following setting: CIR Patient has agreed to participate in recommended program. Yes Note that insurance prior authorization may be required for reimbursement for recommended care.  Comment: Pt active and independent prior to hospitalization. Rehab Admissions Coordinator to follow up.  Thanks,  Ranelle OysterZachary T. Jessicaann Overbaugh, MD, [place] DomFAAPMR    Charlton AmorANGIULLI,DANIEL J., PA-C 01/19/2016

## 2016-01-19 NOTE — Progress Notes (Addendum)
PROGRESS NOTE                                                                                                                                                                                                             Patient Demographics:    Gordon Walker, is a 50 y.o. male, DOB - [birth year], VWU:[id number]  Admit date - 01/14/2016   Admitting Physician Oretha Milch, MD  Outpatient Primary MD for the patient is Darrow Bussing, MD  LOS - 4  Outpatient Specialists:   Chief Complaint  Patient presents with  . Hyperglycemia       Brief Narrative  50 year old male with type 1 diabetes mellitus, peripheral vascular disease and hypertension was recently operated for a semi-tinnitus of right fifth toe with partial amputation of the toe on 12/30/2015 and discharged home on Bactrim presented with nausea and dry heaving associated epigastric pain. He was confused on the day of admission, increasingly tired with several episodes of watery loose stool. He was found to be in DKA and acute kidney injury. Patient was septic with fever of 100.80F and WBC of 21K. 1/2 blood culture was positive for Escherichia coli. Patient admitted with DKA and sepsis.    Subjective:   Patient taken to OR and underwent right below-knee amputation. Postoperatively after arriving to the floor he was found to be unresponsive with eyes rolled back. Our RT was called and patient placed on NRB. Symptoms lasted for almost 3 minutes and slowly started responding to painful stimuli. Narcan was given and within a few minutes he was fully alert and oriented. No witnessed shaking, bowel or urinary incontinence noted. Patient denies any symptoms this morning. Pain is well controlled on current regimen.  Assessment  & Plan :    Principal Problem:   Sepsis (HCC) Secondary to Escherichia coli bacteremia in DKA on presentation. No clear source of infection. Sepsis  resolved of his right fluids and empiric antibiotics. Narrow antibiotics to IV Ancef based on sensitivity (was on vancomycin and Zosyn).  Active Problems:   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, with long-term current use of insulin (HCC) Blood sugar of 1000 on presentation. Reports he may have missed some insulin doses with acute illness. Treated aggressively in the with IV fluids and IV insulin. Now resolved. Stable on Lantus and pre-meal aspart.  Last A1c at endocrinologist office was 8.2 (on 12/6)  Acute unresponsiveness due to narcotic overdose postoperatively on 12/27 Resolved. Discontinue when necessary Dilaudid and monitor on Vicodin.      DKA (diabetic ketoacidoses) (HCC) Plan as outlined above.  Escherichia coli bacteremia. No clear source. Could be due to wound infection. Transaminitis on admission with normal abdominal ultrasound.  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 Empiric antibiotics.  Transaminitis and hyperbilirubinemia Ultrasound abdomen unremarkable. LFTs worsened further in a.m. lab. Bilirubin normalized. hepatitis panel negative. Check CK. ? Shock liver with dehydration admission. Monitor daily. If no improvement in am , call GI.  Acute kidney injury(HCC) Secondary to dehydration and sepsis. Now resolved with aggressive hydration.  Leukocytosis Possibly reactive with DKA and sepsis Monitor for now.  Hypokalemia/hypophosphatemia Replenished  Microcytic anemia Possibly due to postoperative blood loss. Passage 1 unit PRBC. Monitor for now. Check iron panel.  Code Status : Full code  Family Communication  : sister at bedside Disposition Plan  : Seen by PT/OT and recommended CIR  Barriers For Discharge : Pending improvement, possibly d/c in next 48 hrs, if wbc and LFTs improving and no further recommendations per orthopedics.  Consults  :   Orthopedics ultrasound abdomen  Procedures  :  Revision of right ankle stump  DVT Prophylaxis  :  Lovenox -   Lab Results  Component Value Date   PLT 305 01/19/2016    Antibiotics  :   Anti-infectives    Start     Dose/Rate Route Frequency Ordered Stop   01/18/16 2015  ceFAZolin (ANCEF) IVPB 1 g/50 mL premix  Status:  Discontinued     1 g 100 mL/hr over 30 Minutes Intravenous Every 6 hours 01/18/16 2004 01/18/16 2010   01/18/16 1400  ceFAZolin (ANCEF) IVPB 1 g/50 mL premix     1 g 100 mL/hr over 30 Minutes Intravenous Every 8 hours 01/18/16 1055     01/16/16 1300  vancomycin (VANCOCIN) IVPB 1000 mg/200 mL premix  Status:  Discontinued     1,000 mg 200 mL/hr over 60 Minutes Intravenous Every 12 hours 01/16/16 1120 01/18/16 0946   01/15/16 2200  vancomycin (VANCOCIN) 1,250 mg in sodium chloride 0.9 % 250 mL IVPB  Status:  Discontinued     1,250 mg 166.7 mL/hr over 90 Minutes Intravenous Every 24 hours 01/15/16 0020 01/16/16 1120   01/15/16 0600  piperacillin-tazobactam (ZOSYN) IVPB 3.375 g  Status:  Discontinued     3.375 g 12.5 mL/hr over 240 Minutes Intravenous Every 8 hours 01/15/16 0020 01/18/16 0946   01/14/16 2230  piperacillin-tazobactam (ZOSYN) IVPB 3.375 g     3.375 g 100 mL/hr over 30 Minutes Intravenous  Once 01/14/16 2219 01/14/16 2324   01/14/16 2230  vancomycin (VANCOCIN) IVPB 1000 mg/200 mL premix     1,000 mg 200 mL/hr over 60 Minutes Intravenous  Once 01/14/16 2219 01/15/16 0113        Objective:   Vitals:   01/18/16 2055 01/18/16 2300 01/19/16 0219 01/19/16 0545  BP:   (!) 147/87 (!) 143/85  Pulse:   89 88  Resp:   18 17  Temp:   98 F (36.7 C) 98.5 F (36.9 C)  TempSrc:   Oral Oral  SpO2: 93% 100% 100% 100%  Weight:    78.2 kg (172 lb 4.8 oz)  Height:        Wt Readings from Last 3 Encounters:  01/19/16 78.2 kg (172 lb 4.8 oz)  12/30/15 84.4 kg (186 lb)  12/28/15 84.4 kg (186 lb)     Intake/Output Summary (Last 24 hours) at 01/19/16  1151 Last data filed at 01/19/16 0912  Gross per 24 hour  Intake          2231.08 ml  Output             2400 ml  Net          -168.92 ml     Physical Exam  Gen: not in distress HEENT:  moist mucosa, supple neck Chest: clear b/l, no added sounds CVS: N S1&S2, no murmurs,  GI: soft, NT, ND, Musculoskeletal: warm, no edema,  dressing over right Leg     Data Review:    CBC  Recent Labs Lab 01/14/16 2143  01/16/16 0431 01/16/16 0825 01/17/16 0349 01/18/16 0644 01/19/16 0515  WBC 38.7*  < > 21.3* 19.4* 21.3* 18.4* 20.8*  HGB 9.7*  < > 8.4* 8.2* 8.3* 8.2* 8.7*  HCT 31.4*  < > 24.3* 23.3* 24.1* 24.2* 26.0*  PLT 408*  < > 294 277 276 288 305  MCV 90.8  < > 77.9* 78.5 79.8 80.1 83.1  MCH 28.0  < > 26.9 27.6 27.5 27.2 27.8  MCHC 30.9  < > 34.6 35.2 34.4 33.9 33.5  RDW 13.9  < > 12.1 12.1 12.7 12.4 13.1  LYMPHSABS 2.7  --   --   --   --   --   --   MONOABS 5.0*  --   --   --   --   --   --   EOSABS 0.4  --   --   --   --   --   --   BASOSABS 0.0  --   --   --   --   --   --   < > = values in this interval not displayed.  Chemistries   Recent Labs Lab 01/14/16 2143  01/15/16 0448  01/16/16 0431 01/16/16 0824 01/17/16 0349 01/18/16 0644 01/19/16 0515  NA 115*  < > 125*  < > 140 139 133* 134* 134*  K >7.5*  < > 6.3*  < > 3.9 4.2 3.6 3.5 3.5  CL 75*  < > 92*  < > 110 109 102 100* 102  CO2 <7*  < > 11*  < > 26 24 26 27 29   GLUCOSE 1,003*  < > 770*  < > 134* 181* 209* 200* 103*  BUN 83*  < > 83*  < > 38* 34* 21* 16 12  CREATININE 3.52*  < > 3.42*  < > 1.53* 1.41* 1.09 0.99 0.96  CALCIUM 8.1*  < > 7.6*  < > 7.6* 7.5* 7.6* 7.6* 7.6*  MG  --   --  2.9*  --  2.5*  --  1.9  --   --   AST 80*  --   --   --   --   --   --  188* 284*  ALT 67*  --   --   --   --   --   --  98* 134*  ALKPHOS 266*  --   --   --   --   --   --  286* 346*  BILITOT 3.0*  --   --   --   --   --   --  1.0 1.2  < > = values in  this interval not  displayed. ------------------------------------------------------------------------------------------------------------------ No results for input(s): CHOL, HDL, LDLCALC, TRIG, CHOLHDL, LDLDIRECT in the last 72 hours.  Lab Results  Component Value Date   HGBA1C 8.2 12/28/2015   ------------------------------------------------------------------------------------------------------------------ No results for input(s): TSH, T4TOTAL, T3FREE, THYROIDAB in the last 72 hours.  Invalid input(s): FREET3 ------------------------------------------------------------------------------------------------------------------ No results for input(s): VITAMINB12, FOLATE, FERRITIN, TIBC, IRON, RETICCTPCT in the last 72 hours.  Coagulation profile No results for input(s): INR, PROTIME in the last 168 hours.  No results for input(s): DDIMER in the last 72 hours.  Cardiac Enzymes No results for input(s): CKMB, TROPONINI, MYOGLOBIN in the last 168 hours.  Invalid input(s): CK ------------------------------------------------------------------------------------------------------------------ No results found for: BNP  Inpatient Medications  Scheduled Meds: . sodium chloride   Intravenous Once  .  ceFAZolin (ANCEF) IV  1 g Intravenous Q8H  . enoxaparin (LOVENOX) injection  30 mg Subcutaneous Q24H  . insulin aspart  0-20 Units Subcutaneous TID WC  . insulin aspart  0-5 Units Subcutaneous QHS  . insulin aspart  4 Units Subcutaneous TID WC  . insulin glargine  30 Units Subcutaneous QHS   Continuous Infusions: . sodium chloride 75 mL/hr at 01/18/16 2037  . lactated ringers 10 mL/hr at 01/18/16 1800   PRN Meds:.sodium chloride, acetaminophen **OR** acetaminophen, HYDROcodone-acetaminophen, HYDROmorphone (DILAUDID) injection, metoCLOPramide **OR** metoCLOPramide (REGLAN) injection, ondansetron **OR** ondansetron (ZOFRAN) IV  Micro Results Recent Results (from the past 240 hour(s))  Urine culture      Status: None   Collection Time: 01/14/16  9:44 PM  Result Value Ref Range Status   Specimen Description URINE, CLEAN CATCH  Final   Special Requests NONE  Final   Culture NO GROWTH  Final   Report Status 01/16/2016 FINAL  Final  Blood Culture (routine x 2)     Status: Abnormal   Collection Time: 01/14/16 10:07 PM  Result Value Ref Range Status   Specimen Description BLOOD RIGHT HAND  Final   Special Requests IN PEDIATRIC BOTTLE 1CC  Final   Culture  Setup Time   Final    GRAM NEGATIVE RODS IN PEDIATRIC BOTTLE CRITICAL RESULT CALLED TO, READ BACK BY AND VERIFIED WITH: M SHUDA,PHARMD AT [id number] 01/15/16 BY L BENFIELD    Culture ESCHERICHIA COLI (A)  Final   Report Status 01/18/2016 FINAL  Final   Organism ID, Bacteria ESCHERICHIA COLI  Final      Susceptibility   Escherichia coli - MIC*    AMPICILLIN >=32 RESISTANT Resistant     CEFAZOLIN <=4 SENSITIVE Sensitive     CEFEPIME <=1 SENSITIVE Sensitive     CEFTAZIDIME <=1 SENSITIVE Sensitive     CEFTRIAXONE <=1 SENSITIVE Sensitive     CIPROFLOXACIN >=4 RESISTANT Resistant     GENTAMICIN <=1 SENSITIVE Sensitive     IMIPENEM <=0.25 SENSITIVE Sensitive     TRIMETH/SULFA >=320 RESISTANT Resistant     AMPICILLIN/SULBACTAM 16 INTERMEDIATE Intermediate     PIP/TAZO <=4 SENSITIVE Sensitive     Extended ESBL NEGATIVE Sensitive     * ESCHERICHIA COLI  Blood Culture ID Panel (Reflexed)     Status: Abnormal   Collection Time: 01/14/16 10:07 PM  Result Value Ref Range Status   Enterococcus species NOT DETECTED NOT DETECTED Final   Listeria monocytogenes NOT DETECTED NOT DETECTED Final   Staphylococcus species NOT DETECTED NOT DETECTED Final   Staphylococcus aureus NOT DETECTED NOT DETECTED Final   Streptococcus species NOT DETECTED NOT DETECTED Final   Streptococcus agalactiae NOT DETECTED NOT DETECTED Final  Streptococcus pneumoniae NOT DETECTED NOT DETECTED Final   Streptococcus pyogenes NOT DETECTED NOT DETECTED Final   Acinetobacter  baumannii NOT DETECTED NOT DETECTED Final   Enterobacteriaceae species DETECTED (A) NOT DETECTED Final    Comment: CRITICAL RESULT CALLED TO, READ BACK BY AND VERIFIED WITH: M SHUDA,PHARMD AT 1828 01/15/16 BY L BENFIELD    Enterobacter cloacae complex NOT DETECTED NOT DETECTED Final   Escherichia coli DETECTED (A) NOT DETECTED Final    Comment: CRITICAL RESULT CALLED TO, READ BACK BY AND VERIFIED WITH: M SHUDA,PHARMD AT 1828 01/15/16 BY L BENFIELD    Klebsiella oxytoca NOT DETECTED NOT DETECTED Final   Klebsiella pneumoniae NOT DETECTED NOT DETECTED Final   Proteus species NOT DETECTED NOT DETECTED Final   Serratia marcescens NOT DETECTED NOT DETECTED Final   Carbapenem resistance NOT DETECTED NOT DETECTED Final   Haemophilus influenzae NOT DETECTED NOT DETECTED Final   Neisseria meningitidis NOT DETECTED NOT DETECTED Final   Pseudomonas aeruginosa NOT DETECTED NOT DETECTED Final   Candida albicans NOT DETECTED NOT DETECTED Final   Candida glabrata NOT DETECTED NOT DETECTED Final   Candida krusei NOT DETECTED NOT DETECTED Final   Candida parapsilosis NOT DETECTED NOT DETECTED Final   Candida tropicalis NOT DETECTED NOT DETECTED Final  Blood Culture (routine x 2)     Status: None (Preliminary result)   Collection Time: 01/14/16 10:47 PM  Result Value Ref Range Status   Specimen Description BLOOD RIGHT ANTECUBITAL  Final   Special Requests BOTTLES DRAWN AEROBIC AND ANAEROBIC 5CC EA  Final   Culture NO GROWTH 3 DAYS  Final   Report Status PENDING  Incomplete  MRSA PCR Screening     Status: None   Collection Time: 01/15/16  1:43 AM  Result Value Ref Range Status   MRSA by PCR NEGATIVE NEGATIVE Final    Comment:        The GeneXpert MRSA Assay (FDA approved for NASAL specimens only), is one component of a comprehensive MRSA colonization surveillance program. It is not intended to diagnose MRSA infection nor to guide or monitor treatment for MRSA infections.   Surgical pcr  screen     Status: Abnormal   Collection Time: 01/17/16  6:47 PM  Result Value Ref Range Status   MRSA, PCR INVALID RESULTS, SPECIMEN SENT FOR CULTURE (A) NEGATIVE Final    Comment: RESULT CALLED TO, READ BACK BY AND VERIFIED WITH: Q LOFTIN,RN @0103  01/18/16 MKELLY,MLT    Staphylococcus aureus INVALID RESULTS, SPECIMEN SENT FOR CULTURE (A) NEGATIVE Final    Comment:        The Xpert SA Assay (FDA approved for NASAL specimens in patients over 74 years of age), is one component of a comprehensive surveillance program.  Test performance has been validated by [facility] for patients greater than or equal to 77 year old. It is not intended to diagnose infection nor to guide or monitor treatment.   MRSA culture     Status: None (Preliminary result)   Collection Time: 01/17/16  6:47 PM  Result Value Ref Range Status   Specimen Description NASAL SWAB  Final   Special Requests NONE  Final   Culture CULTURE REINCUBATED FOR BETTER GROWTH  Final   Report Status PENDING  Incomplete    Radiology Reports Mr Foot Right W Wo Contrast  Result Date: 01/15/2016 CLINICAL DATA:  Peripheral vascular disease, severe diabetic ketoacidosis, glucose values greater than 600 today. Extensive gas in the soft tissues of the right foot, fifth toe  amputation sixteen days ago. EXAM: MRI OF THE RIGHT FOREFOOT WITHOUT AND WITH CONTRAST TECHNIQUE: Multiplanar, multisequence MR imaging of the right foot was performed before and after the administration of intravenous contrast. CONTRAST:  7mL MULTIHANCE GADOBENATE DIMEGLUMINE 529 MG/ML IV SOLN COMPARISON:  01/15/2016 FINDINGS: Despite efforts by the technologist and patient, motion artifact is present on today's exam and could not be eliminated. This reduces exam sensitivity and specificity. Bones/Joint/Cartilage Abnormal scattered gas signal intensities tracking proximally in the first, second, third, fourth, and residual fifth metatarsal and also in the cuneiform  spur. Abnormal osseous edema in the fourth metatarsal distally and in the cuboid. There is only subtle accentuated enhancement distally in the fourth metatarsal in the region of the edema. Suspected enhancement in the cuboid and in patchy regions of the distal first metatarsal. Ligaments Lisfranc ligament obscured Muscles and Tendons Extensive abnormal gas density tracking along the musculotendinous structures of the forefoot. Regional muscular enhancement compatible with myositis. Soft tissues Extensive subcutaneous edema and enhancement compatible with cellulitis. A focal well-defined abscess is not well seen. IMPRESSION: 1. Extensive abnormal gas in the soft tissues and bony structures of the right forefoot and distal midfoot, favoring gas-forming infection. This can be fulminant and may require further amputation. These results were called by telephone at the time of interpretation on 01/15/2016 at 5:24 pm to Dr. Ophelia CharterYates, who verbally acknowledged these results. Electronically Signed   By: Gaylyn RongWalter  Liebkemann M.D.   On: 01/15/2016 17:24   Dg Chest Portable 1 View  Result Date: 01/14/2016 CLINICAL DATA:  Altered mental status.  Hyperglycemia. EXAM: PORTABLE CHEST 1 VIEW COMPARISON:  None. FINDINGS: A single AP portable view of the chest demonstrates no focal airspace consolidation or alveolar edema. The lungs are grossly clear. There is no large effusion or pneumothorax. Cardiac and mediastinal contours appear unremarkable. IMPRESSION: No active disease. Electronically Signed   By: Ellery Plunkaniel R Mitchell M.D.   On: 01/14/2016 22:16   Dg Abd Portable 1v  Result Date: 01/15/2016 CLINICAL DATA:  Abdominal pain. EXAM: PORTABLE ABDOMEN - 1 VIEW COMPARISON:  None. FINDINGS: Bowel gas pattern is nonobstructive. No evidence of free peritoneal air. Nonspecific calcific densities just left of the spine in the mid abdomen. Remaining bones and soft tissues are within normal. IMPRESSION: Nonobstructive bowel gas pattern.  Electronically Signed   By: Elberta Fortisaniel  Boyle M.D.   On: 01/15/2016 08:18   Dg Foot 2 Views Right  Result Date: 01/15/2016 CLINICAL DATA:  Right foot infection. Fifth toe amputation 12/30/2015. EXAM: RIGHT FOOT - 2 VIEW COMPARISON:  None. FINDINGS: Evidence of patient's recent transmetatarsal amputation of the fifth toe. There is subcutaneous air over the plantar and dorsal soft tissues of the midfoot and forefoot which may be due to patient's diffuse soft tissue infection. Evidence of small vessel atherosclerotic disease. No definite bone destruction to suggest osteomyelitis. IMPRESSION: Evidence of recent fifth toe transmetatarsal amputation. Moderate air within the dorsal and plantar soft tissues of the mid to forefoot suggesting gas-forming infection. Electronically Signed   By: Elberta Fortisaniel  Boyle M.D.   On: 01/15/2016 11:52   [place] Abdomen Limited Ruq  Result Date: 01/15/2016 CLINICAL DATA:  Hyperbilirubinemia, type I diabetes mellitus, hypertension EXAM: US ABDOMEN LIMITED - RIGHT UPPER QUADRANT COMPARISON:  None FINDINGS: Gallbladder: Normally distended with dependent sludge. No gallbladder wall thickening, pericholecystic fluid, definite calculi, or sonographic Murphy sign. Common bile duct: Diameter: Normal caliber 2 mm diameter Liver: Mildly heterogeneous. Hepatopetal portal venous flow. No definite hepatic mass or nodularity. No RIGHT  upper quadrant ascites. Small RIGHT pleural effusion incidentally noted. IMPRESSION: Small none dependent sludge within gallbladder. No evidence of cholelithiasis, cholecystitis or biliary dilatation. Small RIGHT pleural effusion. Electronically Signed   By: Ulyses Southward M.D.   On: 01/15/2016 09:56    Time Spent in minutes 35   Eddie North M.D on 01/19/2016 at 11:51 AM  Between 7am to 7pm - Pager - [phone]  After 7pm go to [email or web address] - password [facility]  [facility] -  Office  [phone]

## 2016-01-19 NOTE — Progress Notes (Addendum)
Rehab admissions - I am following for potential acute inpatient rehab admission.  Awaiting PT re-evaluation and recommendations.  Call me for questions.  #[phone]  I met with patient and his wife.  They would like inpatient rehab admission.  I have opened the case and have faxed to [facility] for review.  I will follow up tomorrow after I hear back from [facility].  #[phone]

## 2016-01-19 NOTE — Progress Notes (Signed)
Occupational Therapy Treatment and Re-Evaluation Patient Details Name: Gordon Walker MRN: [id number] DOB: May 12, 1965 Today's Date: 01/19/2016    History of present illness 50 y.o.malewith DM type 1, HTN, L transmetatarsal amputation, and peripheral vasular disease who presented with DKA. Now s/p R BKA on 12/27.   OT comments  Pt now s/p R BKA; c/o increased pain and R residual limb feels "different". Pt required increased assist for bed mobility today. Mod assist for sit to stand and min assist for stand pivot transfer. Pt declined participation in ADL due to pain. Updated d/c recommendations to CIR; feel pt could benefit from CIR level therapies to maximize independence and safety with ADL and functional mobility prior to return home. Goals remain appropriate; will continue to follow pt acutely.   Follow Up Recommendations  CIR;Supervision/Assistance - 24 hour    Equipment Recommendations  None recommended by OT    Recommendations for Other Services Rehab consult    Precautions / Restrictions Precautions Precautions: Fall Required Braces or Orthoses: Knee Immobilizer - Right Restrictions Weight Bearing Restrictions: Yes RLE Weight Bearing: Non weight bearing       Mobility Bed Mobility Overal bed mobility: Needs Assistance Bed Mobility: Supine to Sit     Supine to sit: Min assist     General bed mobility comments: Increased time but pt able to manage LEs to EOB. Min hand held assist for trunk elevation to sitting and to scoot hips out to EOB. HOB flat with use of bed rails.  Transfers Overall transfer level: Needs assistance Equipment used: Rolling walker (2 wheeled) Transfers: Sit to/from [employer]GI CorporationStand;Stand Pivot Transfers Sit to Stand: Mod assist Stand pivot transfers: Min assist            Balance Overall balance assessment: Needs assistance Sitting-balance support: Bilateral upper extremity supported Sitting balance-Leahy Scale: Fair     Standing balance  support: Bilateral upper extremity supported Standing balance-Leahy Scale: Poor                     ADL Overall ADL's : Needs assistance/impaired                         Toilet Transfer: Moderate assistance;Stand-pivot;RW Toilet Transfer Details (indicate cue type and reason): Simulated by stand pivot to chair. Mod assist for sit to stand and min assist for stand pivot.         Functional mobility during ADLs: Moderate assistance;Rolling walker (for stand pivot only) General ADL Comments: Pt with increased pain today; declining participation in ADL but agreeable to OOB to chair.      Vision                     Perception     Praxis      Cognition   Behavior During Therapy: WFL for tasks assessed/performed Overall Cognitive Status: Within Functional Limits for tasks assessed                       Extremity/Trunk Assessment               Exercises     Shoulder Instructions       General Comments      Pertinent Vitals/ Pain       Pain Assessment: 0-10 Pain Score: 7  Pain Location: RLE Pain Descriptors / Indicators: Aching;Grimacing;Guarding;Operative site guarding Pain Intervention(s): Monitored during session;Repositioned;Patient requesting pain meds-RN notified  Home Living  Prior Functioning/Environment              Frequency  Min 2X/week        Progress Toward Goals  OT Goals(current goals can now be found in the care plan section)  Progress towards OT goals: Progressing toward goals  Acute Rehab OT Goals Patient Stated Goal: decrease pain OT Goal Formulation: With patient  Plan Discharge plan needs to be updated    Co-evaluation                 End of Session Equipment Utilized During Treatment: Gait belt;Rolling walker;Right knee immobilizer;Oxygen   Activity Tolerance Patient limited by pain;Patient tolerated treatment well    Patient Left in chair;with call bell/phone within reach;with chair alarm set;with family/visitor present   Nurse Communication Mobility status;Patient requests pain meds        Time: 1111-1130 OT Time Calculation (min): 19 min  Charges: OT General Charges $OT Visit: 1 Procedure OT Evaluation $OT Re-eval: 1 Procedure  Gaye AlkenBailey A Cortana Vanderford M.S., OTR/L Pager: [phone]  01/19/2016, 11:38 AM

## 2016-01-20 ENCOUNTER — Encounter (HOSPITAL_COMMUNITY): Payer: Self-pay | Admitting: Nurse Practitioner

## 2016-01-20 ENCOUNTER — Inpatient Hospital Stay (HOSPITAL_COMMUNITY)
Admission: RE | Admit: 2016-01-20 | Discharge: 2016-01-28 | DRG: 560 | Disposition: A | Discharge status: Home Health | Payer: BLUE CROSS/BLUE SHIELD | Source: Intra-hospital | Attending: Physical Medicine & Rehabilitation | Admitting: Physical Medicine & Rehabilitation

## 2016-01-20 DIAGNOSIS — D62 Acute posthemorrhagic anemia: Secondary | ICD-10-CM | POA: Diagnosis not present

## 2016-01-20 DIAGNOSIS — Z7982 Long term (current) use of aspirin: Secondary | ICD-10-CM | POA: Diagnosis not present

## 2016-01-20 DIAGNOSIS — B9620 Unspecified Escherichia coli [E. coli] as the cause of diseases classified elsewhere: Secondary | ICD-10-CM | POA: Diagnosis not present

## 2016-01-20 DIAGNOSIS — Z4781 Encounter for orthopedic aftercare following surgical amputation: Secondary | ICD-10-CM | POA: Diagnosis present

## 2016-01-20 DIAGNOSIS — E1051 Type 1 diabetes mellitus with diabetic peripheral angiopathy without gangrene: Secondary | ICD-10-CM | POA: Diagnosis not present

## 2016-01-20 DIAGNOSIS — E1042 Type 1 diabetes mellitus with diabetic polyneuropathy: Secondary | ICD-10-CM

## 2016-01-20 DIAGNOSIS — R7881 Bacteremia: Secondary | ICD-10-CM | POA: Diagnosis not present

## 2016-01-20 DIAGNOSIS — E871 Hypo-osmolality and hyponatremia: Secondary | ICD-10-CM

## 2016-01-20 DIAGNOSIS — Z89511 Acquired absence of right leg below knee: Secondary | ICD-10-CM

## 2016-01-20 DIAGNOSIS — K5900 Constipation, unspecified: Secondary | ICD-10-CM | POA: Diagnosis not present

## 2016-01-20 DIAGNOSIS — Z89432 Acquired absence of left foot: Secondary | ICD-10-CM

## 2016-01-20 DIAGNOSIS — Z79899 Other long term (current) drug therapy: Secondary | ICD-10-CM

## 2016-01-20 DIAGNOSIS — Z794 Long term (current) use of insulin: Secondary | ICD-10-CM | POA: Diagnosis not present

## 2016-01-20 DIAGNOSIS — E1052 Type 1 diabetes mellitus with diabetic peripheral angiopathy with gangrene: Secondary | ICD-10-CM | POA: Diagnosis not present

## 2016-01-20 DIAGNOSIS — S88111A Complete traumatic amputation at level between knee and ankle, right lower leg, initial encounter: Secondary | ICD-10-CM

## 2016-01-20 DIAGNOSIS — I10 Essential (primary) hypertension: Secondary | ICD-10-CM | POA: Diagnosis not present

## 2016-01-20 LAB — GLUCOSE, CAPILLARY
GLUCOSE-CAPILLARY: 126 mg/dL — AB (ref 65–99)
GLUCOSE-CAPILLARY: 109 mg/dL — AB (ref 65–99)
Glucose-Capillary: 80 mg/dL (ref 65–99)
Glucose-Capillary: 162 mg/dL — ABNORMAL HIGH (ref 65–99)

## 2016-01-20 LAB — COMPREHENSIVE METABOLIC PANEL
ALT: 113 U/L — ABNORMAL HIGH (ref 17–63)
AST: 182 U/L — AB (ref 15–41)
Albumin: 1.3 g/dL — ABNORMAL LOW (ref 3.5–5.0)
Alkaline Phosphatase: 285 U/L — ABNORMAL HIGH (ref 38–126)
Anion gap: 5 (ref 5–15)
BILIRUBIN TOTAL: 0.7 mg/dL (ref 0.3–1.2)
BUN: 8 mg/dL (ref 6–20)
CO2: 26 mmol/L (ref 22–32)
CREATININE: 0.76 mg/dL (ref 0.61–1.24)
Calcium: 6.9 mg/dL — ABNORMAL LOW (ref 8.9–10.3)
Chloride: 98 mmol/L — ABNORMAL LOW (ref 101–111)
Glucose, Bld: 111 mg/dL — ABNORMAL HIGH (ref 65–99)
POTASSIUM: 3.5 mmol/L (ref 3.5–5.1)
Sodium: 129 mmol/L — ABNORMAL LOW (ref 135–145)
TOTAL PROTEIN: 4.6 g/dL — AB (ref 6.5–8.1)

## 2016-01-20 LAB — CBC
HEMATOCRIT: 21 % — AB (ref 39.0–52.0)
Hemoglobin: 7 g/dL — ABNORMAL LOW (ref 13.0–17.0)
MCH: 27.3 pg (ref 26.0–34.0)
MCHC: 33.3 g/dL (ref 30.0–36.0)
MCV: 82 fL (ref 78.0–100.0)
PLATELETS: 314 10*3/uL (ref 150–400)
RBC: 2.56 MIL/uL — ABNORMAL LOW (ref 4.22–5.81)
RDW: 12.9 % (ref 11.5–15.5)
WBC: 16.8 10*3/uL — AB (ref 4.0–10.5)

## 2016-01-20 LAB — PREPARE RBC (CROSSMATCH)

## 2016-01-20 LAB — CULTURE, BLOOD (ROUTINE X 2): Culture: NO GROWTH

## 2016-01-20 MED ORDER — CEPHALEXIN 500 MG PO CAPS: 500.0000 mg | ORAL_CAPSULE | Freq: Four times a day (QID) | ORAL | 0 refills | Status: DC

## 2016-01-20 MED ORDER — INSULIN ASPART 100 UNIT/ML FLEXPEN: 5.0000 [IU] | PEN_INJECTOR | Freq: Three times a day (TID) | SUBCUTANEOUS | 11 refills | Status: DC

## 2016-01-20 MED ORDER — ONDANSETRON HCL 4 MG PO TABS: 4.0000 mg | ORAL_TABLET | Freq: Four times a day (QID) | ORAL | Status: DC | PRN

## 2016-01-20 MED ORDER — CEFAZOLIN IN D5W 1 GM/50ML IV SOLN
1.0000 g | Freq: Three times a day (TID) | INTRAVENOUS | Status: DC
  Administered 2016-01-20 – 2016-01-28 (×22): 1 g via INTRAVENOUS
  Filled 2016-01-20 (×25): qty 50

## 2016-01-20 MED ORDER — SODIUM CHLORIDE 0.9 % IV SOLN
Freq: Once | INTRAVENOUS | Status: AC
  Administered 2016-01-20: 18:00:00 via INTRAVENOUS

## 2016-01-20 MED ORDER — ACETAMINOPHEN 650 MG RE SUPP: 650.0000 mg | Freq: Four times a day (QID) | RECTAL | Status: DC | PRN

## 2016-01-20 MED ORDER — ENOXAPARIN SODIUM 40 MG/0.4ML ~~LOC~~ SOLN: 40.0000 mg | SUBCUTANEOUS | Status: DC

## 2016-01-20 MED ORDER — INSULIN ASPART 100 UNIT/ML ~~LOC~~ SOLN
0.0000 [IU] | Freq: Three times a day (TID) | SUBCUTANEOUS | Status: DC
  Administered 2016-01-21: 4 [IU] via SUBCUTANEOUS
  Administered 2016-01-21 – 2016-01-22 (×2): 3 [IU] via SUBCUTANEOUS
  Administered 2016-01-25: 4 [IU] via SUBCUTANEOUS
  Administered 2016-01-25: 3 [IU] via SUBCUTANEOUS
  Administered 2016-01-26: 4 [IU] via SUBCUTANEOUS
  Administered 2016-01-26: 7 [IU] via SUBCUTANEOUS
  Administered 2016-01-27: 3 [IU] via SUBCUTANEOUS

## 2016-01-20 MED ORDER — HYDROCODONE-ACETAMINOPHEN 7.5-325 MG PO TABS
1.0000 | ORAL_TABLET | ORAL | Status: DC | PRN
  Administered 2016-01-20 – 2016-01-24 (×15): 2 via ORAL
  Administered 2016-01-24 (×2): 1 via ORAL
  Administered 2016-01-24 – 2016-01-26 (×6): 2 via ORAL
  Administered 2016-01-27 (×2): 1 via ORAL
  Administered 2016-01-27: 2 via ORAL
  Filled 2016-01-20 (×8): qty 2
  Filled 2016-01-20: qty 1
  Filled 2016-01-20 (×4): qty 2
  Filled 2016-01-20: qty 1
  Filled 2016-01-20 (×7): qty 2
  Filled 2016-01-20: qty 1
  Filled 2016-01-20 (×4): qty 2

## 2016-01-20 MED ORDER — INSULIN ASPART 100 UNIT/ML ~~LOC~~ SOLN
4.0000 [IU] | Freq: Three times a day (TID) | SUBCUTANEOUS | Status: DC
  Administered 2016-01-21 – 2016-01-25 (×7): 4 [IU] via SUBCUTANEOUS

## 2016-01-20 MED ORDER — INSULIN GLARGINE 100 UNIT/ML ~~LOC~~ SOLN
30.0000 [IU] | Freq: Every day | SUBCUTANEOUS | Status: DC
  Administered 2016-01-21 – 2016-01-25 (×5): 30 [IU] via SUBCUTANEOUS
  Filled 2016-01-20 (×5): qty 0.3

## 2016-01-20 MED ORDER — ENOXAPARIN SODIUM 40 MG/0.4ML ~~LOC~~ SOLN
40.0000 mg | SUBCUTANEOUS | Status: DC
  Administered 2016-01-21 – 2016-01-28 (×8): 40 mg via SUBCUTANEOUS
  Filled 2016-01-20 (×8): qty 0.4

## 2016-01-20 MED ORDER — ACETAMINOPHEN 325 MG PO TABS
650.0000 mg | ORAL_TABLET | Freq: Four times a day (QID) | ORAL | Status: DC | PRN
  Administered 2016-01-24: 650 mg via ORAL
  Filled 2016-01-20: qty 2

## 2016-01-20 MED ORDER — SORBITOL 70 % SOLN
30.0000 mL | Freq: Every day | Status: DC | PRN
  Administered 2016-01-25: 30 mL via ORAL
  Filled 2016-01-20: qty 30

## 2016-01-20 MED ORDER — FERROUS SULFATE 325 (65 FE) MG PO TABS: 325.0000 mg | ORAL_TABLET | Freq: Two times a day (BID) | ORAL | 0 refills | Status: DC

## 2016-01-20 MED ORDER — ONDANSETRON HCL 4 MG/2ML IJ SOLN: 4.0000 mg | Freq: Four times a day (QID) | INTRAMUSCULAR | Status: DC | PRN

## 2016-01-20 MED ORDER — INSULIN GLARGINE 100 UNIT/ML ~~LOC~~ SOLN: 30.0000 [IU] | Freq: Every day | SUBCUTANEOUS | 11 refills | Status: DC

## 2016-01-20 MED ORDER — SODIUM CHLORIDE 0.9 % IV SOLN
Freq: Once | INTRAVENOUS | Status: AC
  Administered 2016-01-20: 11:00:00 via INTRAVENOUS

## 2016-01-20 NOTE — Progress Notes (Signed)
Rehab admissions - I have insurance approval for inpatient rehab admission for today.  I have attending MD clearance.  Bed available and will admit to inpatient rehab today.  Call me for questions.  #[phone]

## 2016-01-20 NOTE — Discharge Summary (Signed)
Physician Discharge Summary  Gordon Walker WUJ:[id number] DOB: 08/11/1965 DOA: 01/14/2016  PCP: Darrow Bussing, MD  Admit date: 01/14/2016 Discharge date: 01/20/2016  Admitted From: home Disposition:  CIR  Recommendations for Outpatient Follow-up:  1. Follow up with M.D. at inpatient rehabilitation area patient will complete 2 weeks course of antibiotics on 01/27/2015. 2. Monitor H&H in next 3-4 days. Please also monitor LFTs and if unimproved he will need GI evaluation. 3. Patient should follow-up with Dr. Ophelia Charter in 1 week. Instructed to keep the dressing until then and continue the knee immobilizer. (To hold the stump)   Equipment/Devices: As per therapy at rehabilitation  Discharge Condition: Fair CODE STATUS: Full code Diet recommendation:  Carb Modified   Discharge Diagnoses:  Principal Problem:   Sepsis (HCC)   Active Problems:   DKA (diabetic ketoacidoses) (HCC)   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, with long-term current use of insulin (HCC)   Gas gangrene of foot (HCC)   Hyponatremia   AKI (acute kidney injury) (HCC)   Hyperbilirubinemia   Bacteremia due to Escherichia coli   Acute blood loss anemia  Brief narrative/history of present illness Please refer to admission H&P for details, in brief,50 year old male with type 1 diabetes mellitus, peripheral vascular disease and hypertension was recently 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 He was confused on the day of admission, increasingly tired with several episodes of watery loose stool. He was found to be in DKA and acute kidney injury. Patient was septic with fever of 100.79F and WBC of 21K. 1/2 blood culture was positive for Escherichia coli. Patient admitted to ICU with DKA and sepsis. Transfer to hospitalist service once  improved.    Hospital course  Principal Problem:   Sepsis (HCC) Secondary to Escherichia coli bacteremia and DKA on presentation. No clear source of infection. Sepsis resolved with aggressive IV fluids and empiric antibiotics. Antibiotic narrowed based on sensitivity. Patient will be discharged on oral Keflex to complete 14 days of and divided course.  Active Problems:   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, with long-term current use of insulin (HCC) Blood sugar of 1000 on presentation. Reports he may have missed some insulin doses with acute illness. Treated aggressively in the with IV fluids and IV insulin. Now resolved.  Adjusted Lantus and pre-meal aspart dose. CBG stable.  Last A1c at endocrinologist office was 8.2 (on 12/6)  Acute unresponsiveness due to narcotic overdose postoperatively on 12/27 Resolved. Discontinued Dilaudid and currently stable on when necessary Vicodin.      DKA (diabetic ketoacidoses) (HCC) Plan as outlined above.  Escherichia coli bacteremia. No clear source. Could be due to wound infection. Transaminitis on admission with normal abdominal ultrasound. Being discharged on Keflex to complete 2 weeks antibiotic course.  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 Revision with closure done on 12/27. Discussed with Dr. Ophelia Charter who recommended to continue current dressing and follow-up with him in the office in one week. Also instructed to continue with knee immobilizer.  Transaminitis and hyperbilirubinemia Ultrasound abdomen unremarkable.  ? Shock liver withdehydration on admission. Bilirubin normalized and LFTs improving. Hepatitis panel negative. Please monitor in the next few days and if worsened or unimproved will need GI evaluation.  Acute kidney injury(HCC) Secondary to dehydration  and sepsis. Now resolved with aggressive  hydration.  Leukocytosis Possibly reactive with DKA and sepsis improving.  Hypokalemia/hypophosphatemia Replenished  Microcytic anemia Possibly due to postoperative blood loss. Receiving total 3 unit blood this hospitalization. Iron panel suggests iron deficiency. We'll add supplement.   Hyponatremia Mild possibly due to dehydration. Encouraged on hydration.   Family Communication  :  family at bedside Disposition Plan  :  CIR  Consults  :  Orthopedics ( Dr Ophelia Charter)  Procedures  :  Revision of right ankle stump   Discharge Instructions   Allergies as of 01/20/2016      Reactions   No Known Allergies       Medication List    TAKE these medications   aspirin EC 81 MG tablet Take 81 mg by mouth daily.   atorvastatin 20 MG tablet Commonly known as:  LIPITOR Take 20 mg by mouth daily.   cephALEXin 500 MG capsule Commonly known as:  KEFLEX Take 1 capsule (500 mg total) by mouth 4 (four) times daily.  Ferrous sulfate 325 mg Tablet 1 tablet by mouth twice daily. Qty: 90, no refills   glucagon 1 MG injection Inject 1 mg into the muscle once as needed. What changed:  when to take this  reasons to take this   HYDROcodone-acetaminophen 5-325 MG tablet Commonly known as:  NORCO/VICODIN Take 1 tablet by mouth every 6 (six) hours as needed for moderate pain.   insulin aspart 100 UNIT/ML FlexPen Commonly known as:  NOVOLOG FLEXPEN Inject 5 Units into the skin 3 (three) times daily with meals. And pen needles 3/day What changed:  how much to take   insulin glargine 100 UNIT/ML injection Commonly known as:  LANTUS Inject 0.3 mLs (30 Units total) into the skin at bedtime. Sliding scale What changed:  how much to take   lisinopril 10 MG tablet Commonly known as:  PRINIVIL,ZESTRIL Take 10 mg by mouth daily.   LUBRICANT DROPS OP Apply 1 drop to eye daily as needed (dry eyes).   multivitamin with minerals Tabs tablet Take 1 tablet by mouth daily.       Follow-up Information    Eldred Manges, MD. Schedule an appointment as soon as possible for a visit in 1 week(s).   Specialty:  Orthopedic Surgery Contact information: [address] [place] [address] [phone]          Allergies  Allergen Reactions  . No Known Allergies       Procedures/Studies: Mr Foot Right W Wo Contrast  Result Date: 01/15/2016 CLINICAL DATA:  Peripheral vascular disease, severe diabetic ketoacidosis, glucose values greater than 600 today. Extensive gas in the soft tissues of the right foot, fifth toe amputation sixteen days ago. EXAM: MRI OF THE RIGHT FOREFOOT WITHOUT AND WITH CONTRAST TECHNIQUE: Multiplanar, multisequence MR imaging of the right foot was performed before and after the administration of intravenous contrast. CONTRAST:  7mL MULTIHANCE GADOBENATE DIMEGLUMINE 529 MG/ML IV SOLN COMPARISON:  01/15/2016 FINDINGS: Despite efforts by the technologist and patient, motion artifact is present on today's exam and could not be eliminated. This reduces exam sensitivity and specificity. Bones/Joint/Cartilage Abnormal scattered gas signal intensities tracking proximally in the first, second, third, fourth, and residual fifth metatarsal and also in the cuneiform spur. Abnormal osseous edema in the fourth metatarsal distally and in the cuboid. There is only subtle accentuated enhancement distally in the fourth metatarsal in the region of the edema. Suspected enhancement in the cuboid and in patchy regions of  the distal first metatarsal. Ligaments Lisfranc ligament obscured Muscles and Tendons Extensive abnormal gas density tracking along the musculotendinous structures of the forefoot. Regional muscular enhancement compatible with myositis. Soft tissues Extensive subcutaneous edema and enhancement compatible with cellulitis. A focal well-defined abscess is not well seen. IMPRESSION: 1. Extensive abnormal gas in the soft tissues and bony structures of  the right forefoot and distal midfoot, favoring gas-forming infection. This can be fulminant and may require further amputation. These results were called by telephone at the time of interpretation on 01/15/2016 at 5:24 pm to Dr. Ophelia CharterYates, who verbally acknowledged these results. Electronically Signed   By: Gaylyn RongWalter  Liebkemann M.D.   On: 01/15/2016 17:24   Dg Chest Portable 1 View  Result Date: 01/14/2016 CLINICAL DATA:  Altered mental status.  Hyperglycemia. EXAM: PORTABLE CHEST 1 VIEW COMPARISON:  None. FINDINGS: A single AP portable view of the chest demonstrates no focal airspace consolidation or alveolar edema. The lungs are grossly clear. There is no large effusion or pneumothorax. Cardiac and mediastinal contours appear unremarkable. IMPRESSION: No active disease. Electronically Signed   By: Ellery Plunkaniel R Mitchell M.D.   On: 01/14/2016 22:16   Dg Abd Portable 1v  Result Date: 01/15/2016 CLINICAL DATA:  Abdominal pain. EXAM: PORTABLE ABDOMEN - 1 VIEW COMPARISON:  None. FINDINGS: Bowel gas pattern is nonobstructive. No evidence of free peritoneal air. Nonspecific calcific densities just left of the spine in the mid abdomen. Remaining bones and soft tissues are within normal. IMPRESSION: Nonobstructive bowel gas pattern. Electronically Signed   By: Elberta Fortisaniel  Boyle M.D.   On: 01/15/2016 08:18   Dg Foot 2 Views Right  Result Date: 01/15/2016 CLINICAL DATA:  Right foot infection. Fifth toe amputation 12/30/2015. EXAM: RIGHT FOOT - 2 VIEW COMPARISON:  None. FINDINGS: Evidence of patient's recent transmetatarsal amputation of the fifth toe. There is subcutaneous air over the plantar and dorsal soft tissues of the midfoot and forefoot which may be due to patient's diffuse soft tissue infection. Evidence of small vessel atherosclerotic disease. No definite bone destruction to suggest osteomyelitis. IMPRESSION: Evidence of recent fifth toe transmetatarsal amputation. Moderate air within the dorsal and plantar soft  tissues of the mid to forefoot suggesting gas-forming infection. Electronically Signed   By: Elberta Fortisaniel  Boyle M.D.   On: 01/15/2016 11:52   [place] Abdomen Limited Ruq  Result Date: 01/15/2016 CLINICAL DATA:  Hyperbilirubinemia, type I diabetes mellitus, hypertension EXAM: US ABDOMEN LIMITED - RIGHT UPPER QUADRANT COMPARISON:  None FINDINGS: Gallbladder: Normally distended with dependent sludge. No gallbladder wall thickening, pericholecystic fluid, definite calculi, or sonographic Murphy sign. Common bile duct: Diameter: Normal caliber 2 mm diameter Liver: Mildly heterogeneous. Hepatopetal portal venous flow. No definite hepatic mass or nodularity. No RIGHT upper quadrant ascites. Small RIGHT pleural effusion incidentally noted. IMPRESSION: Small none dependent sludge within gallbladder. No evidence of cholelithiasis, cholecystitis or biliary dilatation. Small RIGHT pleural effusion. Electronically Signed   By: Ulyses SouthwardMark  Boles M.D.   On: 01/15/2016 09:56       Subjective: Denies any symptoms.  Discharge Exam: Vitals:   01/20/16 1040 01/20/16 1111  BP: 131/76 (!) 144/79  Pulse: 93 92  Resp: 18 18  Temp: 98.3 F (36.8 C) 98.4 F (36.9 C)   Vitals:   01/20/16 0127 01/20/16 0640 01/20/16 1040 01/20/16 1111  BP:  140/75 131/76 (!) 144/79  Pulse:  89 93 92  Resp:  18 18 18   Temp:  98.1 F (36.7 C) 98.3 F (36.8 C) 98.4 F (36.9 C)  TempSrc:  Oral Oral Oral  SpO2:  98% 96% 92%  Weight: 78.7 kg (173 lb 9.6 oz)     Height:         Gen: not in distress HEENT: Pallor present,  moist mucosa, supple neck Chest: clear b/l, no added sounds CVS: N S1&S2, no murmurs,  GI: soft, NT, ND, Musculoskeletal: warm, no edema,  dressing over right Leg with Ace wrap and knee immobilizer   The results of significant diagnostics from this hospitalization (including imaging, microbiology, ancillary and laboratory) are listed below for reference.     Microbiology: Recent Results (from the past 240  hour(s))  Urine culture     Status: None   Collection Time: 01/14/16  9:44 PM  Result Value Ref Range Status   Specimen Description URINE, CLEAN CATCH  Final   Special Requests NONE  Final   Culture NO GROWTH  Final   Report Status 01/16/2016 FINAL  Final  Blood Culture (routine x 2)     Status: Abnormal   Collection Time: 01/14/16 10:07 PM  Result Value Ref Range Status   Specimen Description BLOOD RIGHT HAND  Final   Special Requests IN PEDIATRIC BOTTLE 1CC  Final   Culture  Setup Time   Final    GRAM NEGATIVE RODS IN PEDIATRIC BOTTLE CRITICAL RESULT CALLED TO, READ BACK BY AND VERIFIED WITH: M SHUDA,PHARMD AT [id number] 01/15/16 BY L BENFIELD    Culture ESCHERICHIA COLI (A)  Final   Report Status 01/18/2016 FINAL  Final   Organism ID, Bacteria ESCHERICHIA COLI  Final      Susceptibility   Escherichia coli - MIC*    AMPICILLIN >=32 RESISTANT Resistant     CEFAZOLIN <=4 SENSITIVE Sensitive     CEFEPIME <=1 SENSITIVE Sensitive     CEFTAZIDIME <=1 SENSITIVE Sensitive     CEFTRIAXONE <=1 SENSITIVE Sensitive     CIPROFLOXACIN >=4 RESISTANT Resistant     GENTAMICIN <=1 SENSITIVE Sensitive     IMIPENEM <=0.25 SENSITIVE Sensitive     TRIMETH/SULFA >=320 RESISTANT Resistant     AMPICILLIN/SULBACTAM 16 INTERMEDIATE Intermediate     PIP/TAZO <=4 SENSITIVE Sensitive     Extended ESBL NEGATIVE Sensitive     * ESCHERICHIA COLI  Blood Culture ID Panel (Reflexed)     Status: Abnormal   Collection Time: 01/14/16 10:07 PM  Result Value Ref Range Status   Enterococcus species NOT DETECTED NOT DETECTED Final   Listeria monocytogenes NOT DETECTED NOT DETECTED Final   Staphylococcus species NOT DETECTED NOT DETECTED Final   Staphylococcus aureus NOT DETECTED NOT DETECTED Final   Streptococcus species NOT DETECTED NOT DETECTED Final   Streptococcus agalactiae NOT DETECTED NOT DETECTED Final   Streptococcus pneumoniae NOT DETECTED NOT DETECTED Final   Streptococcus pyogenes NOT DETECTED NOT  DETECTED Final   Acinetobacter baumannii NOT DETECTED NOT DETECTED Final   Enterobacteriaceae species DETECTED (A) NOT DETECTED Final    Comment: CRITICAL RESULT CALLED TO, READ BACK BY AND VERIFIED WITH: M SHUDA,PHARMD AT 1828 01/15/16 BY L BENFIELD    Enterobacter cloacae complex NOT DETECTED NOT DETECTED Final   Escherichia coli DETECTED (A) NOT DETECTED Final    Comment: CRITICAL RESULT CALLED TO, READ BACK BY AND VERIFIED WITH: M SHUDA,PHARMD AT 1828 01/15/16 BY L BENFIELD    Klebsiella oxytoca NOT DETECTED NOT DETECTED Final   Klebsiella pneumoniae NOT DETECTED NOT DETECTED Final   Proteus species NOT DETECTED NOT DETECTED Final   Serratia marcescens NOT DETECTED NOT DETECTED Final  Carbapenem resistance NOT DETECTED NOT DETECTED Final   Haemophilus influenzae NOT DETECTED NOT DETECTED Final   Neisseria meningitidis NOT DETECTED NOT DETECTED Final   Pseudomonas aeruginosa NOT DETECTED NOT DETECTED Final   Candida albicans NOT DETECTED NOT DETECTED Final   Candida glabrata NOT DETECTED NOT DETECTED Final   Candida krusei NOT DETECTED NOT DETECTED Final   Candida parapsilosis NOT DETECTED NOT DETECTED Final   Candida tropicalis NOT DETECTED NOT DETECTED Final  Blood Culture (routine x 2)     Status: None (Preliminary result)   Collection Time: 01/14/16 10:47 PM  Result Value Ref Range Status   Specimen Description BLOOD RIGHT ANTECUBITAL  Final   Special Requests BOTTLES DRAWN AEROBIC AND ANAEROBIC 5CC EA  Final   Culture NO GROWTH 4 DAYS  Final   Report Status PENDING  Incomplete  MRSA PCR Screening     Status: None   Collection Time: 01/15/16  1:43 AM  Result Value Ref Range Status   MRSA by PCR NEGATIVE NEGATIVE Final    Comment:        The GeneXpert MRSA Assay (FDA approved for NASAL specimens only), is one component of a comprehensive MRSA colonization surveillance program. It is not intended to diagnose MRSA infection nor to guide or monitor treatment for MRSA  infections.   Surgical pcr screen     Status: Abnormal   Collection Time: 01/17/16  6:47 PM  Result Value Ref Range Status   MRSA, PCR INVALID RESULTS, SPECIMEN SENT FOR CULTURE (A) NEGATIVE Final    Comment: RESULT CALLED TO, READ BACK BY AND VERIFIED WITH: Q LOFTIN,RN @0103  01/18/16 MKELLY,MLT    Staphylococcus aureus INVALID RESULTS, SPECIMEN SENT FOR CULTURE (A) NEGATIVE Final    Comment:        The Xpert SA Assay (FDA approved for NASAL specimens in patients over 71 years of age), is one component of a comprehensive surveillance program.  Test performance has been validated by [facility] for patients greater than or equal to 26 year old. It is not intended to diagnose infection nor to guide or monitor treatment.   MRSA culture     Status: None   Collection Time: 01/17/16  6:47 PM  Result Value Ref Range Status   Specimen Description NASAL SWAB  Final   Special Requests NONE  Final   Culture MRSA DETECTED  Final   Report Status 01/19/2016 FINAL  Final     Labs: BNP (last 3 results) No results for input(s): BNP in the last 8760 hours. Basic Metabolic Panel:  Recent Labs Lab 01/15/16 0448  01/16/16 0431 01/16/16 0824 01/17/16 0349 01/18/16 0644 01/19/16 0515 01/20/16 0718  NA 125*  < > 140 139 133* 134* 134* 129*  K 6.3*  < > 3.9 4.2 3.6 3.5 3.5 3.5  CL 92*  < > 110 109 102 100* 102 98*  CO2 11*  < > 26 24 26 27 29 26   GLUCOSE 770*  < > 134* 181* 209* 200* 103* 111*  BUN 83*  < > 38* 34* 21* 16 12 8   CREATININE 3.42*  < > 1.53* 1.41* 1.09 0.99 0.96 0.76  CALCIUM 7.6*  < > 7.6* 7.5* 7.6* 7.6* 7.6* 6.9*  MG 2.9*  --  2.5*  --  1.9  --   --   --   PHOS 3.9  --  1.6*  --  1.9* 2.7  --   --   < > = values in this interval not  displayed. Liver Function Tests:  Recent Labs Lab 01/14/16 2143 01/18/16 0644 01/19/16 0515 01/20/16 0718  AST 80* 188* 284* 182*  ALT 67* 98* 134* 113*  ALKPHOS 266* 286* 346* 285*  BILITOT 3.0* 1.0 1.2 0.7  PROT 6.8 5.6* 5.4*  4.6*  ALBUMIN 2.0* 1.4* 1.5* 1.3*    Recent Labs Lab 01/15/16 0216  LIPASE 18   No results for input(s): AMMONIA in the last 168 hours. CBC:  Recent Labs Lab 01/14/16 2143  01/16/16 0825 01/17/16 0349 01/18/16 0644 01/19/16 0515 01/20/16 0718  WBC 38.7*  < > 19.4* 21.3* 18.4* 20.8* 16.8*  NEUTROABS 30.6*  --   --   --   --   --   --   HGB 9.7*  < > 8.2* 8.3* 8.2* 8.7* 7.0*  HCT 31.4*  < > 23.3* 24.1* 24.2* 26.0* 21.0*  MCV 90.8  < > 78.5 79.8 80.1 83.1 82.0  PLT 408*  < > 277 276 288 305 314  < > = values in this interval not displayed. Cardiac Enzymes: No results for input(s): CKTOTAL, CKMB, CKMBINDEX, TROPONINI in the last 168 hours. BNP: Invalid input(s): POCBNP CBG:  Recent Labs Lab 01/19/16 1215 01/19/16 1740 01/19/16 2106 01/20/16 0816 01/20/16 1218  GLUCAP 175* 237* 207* 126* 109*   D-Dimer No results for input(s): DDIMER in the last 72 hours. Hgb A1c No results for input(s): HGBA1C in the last 72 hours. Lipid Profile No results for input(s): CHOL, HDL, LDLCALC, TRIG, CHOLHDL, LDLDIRECT in the last 72 hours. Thyroid function studies No results for input(s): TSH, T4TOTAL, T3FREE, THYROIDAB in the last 72 hours.  Invalid input(s): FREET3 Anemia work up  Recent Labs  01/19/16 1238  TIBC 141*  IRON 15*   Urinalysis    Component Value Date/Time   COLORURINE YELLOW 01/14/2016 2143   APPEARANCEUR HAZY (A) 01/14/2016 2143   LABSPEC 1.016 01/14/2016 2143   PHURINE 5.0 01/14/2016 2143   GLUCOSEU >=500 (A) 01/14/2016 2143   HGBUR SMALL (A) 01/14/2016 2143   BILIRUBINUR NEGATIVE 01/14/2016 2143   KETONESUR 20 (A) 01/14/2016 2143   PROTEINUR NEGATIVE 01/14/2016 2143   NITRITE NEGATIVE 01/14/2016 2143   LEUKOCYTESUR NEGATIVE 01/14/2016 2143   Sepsis Labs Invalid input(s): PROCALCITONIN,  WBC,  LACTICIDVEN Microbiology Recent Results (from the past 240 hour(s))  Urine culture     Status: None   Collection Time: 01/14/16  9:44 PM  Result Value  Ref Range Status   Specimen Description URINE, CLEAN CATCH  Final   Special Requests NONE  Final   Culture NO GROWTH  Final   Report Status 01/16/2016 FINAL  Final  Blood Culture (routine x 2)     Status: Abnormal   Collection Time: 01/14/16 10:07 PM  Result Value Ref Range Status   Specimen Description BLOOD RIGHT HAND  Final   Special Requests IN PEDIATRIC BOTTLE 1CC  Final   Culture  Setup Time   Final    GRAM NEGATIVE RODS IN PEDIATRIC BOTTLE CRITICAL RESULT CALLED TO, READ BACK BY AND VERIFIED WITH: M SHUDA,PHARMD AT 1191 01/15/16 BY L BENFIELD    Culture ESCHERICHIA COLI (A)  Final   Report Status 01/18/2016 FINAL  Final   Organism ID, Bacteria ESCHERICHIA COLI  Final      Susceptibility   Escherichia coli - MIC*    AMPICILLIN >=32 RESISTANT Resistant     CEFAZOLIN <=4 SENSITIVE Sensitive     CEFEPIME <=1 SENSITIVE Sensitive     CEFTAZIDIME <=1 SENSITIVE Sensitive  CEFTRIAXONE <=1 SENSITIVE Sensitive     CIPROFLOXACIN >=4 RESISTANT Resistant     GENTAMICIN <=1 SENSITIVE Sensitive     IMIPENEM <=0.25 SENSITIVE Sensitive     TRIMETH/SULFA >=320 RESISTANT Resistant     AMPICILLIN/SULBACTAM 16 INTERMEDIATE Intermediate     PIP/TAZO <=4 SENSITIVE Sensitive     Extended ESBL NEGATIVE Sensitive     * ESCHERICHIA COLI  Blood Culture ID Panel (Reflexed)     Status: Abnormal   Collection Time: 01/14/16 10:07 PM  Result Value Ref Range Status   Enterococcus species NOT DETECTED NOT DETECTED Final   Listeria monocytogenes NOT DETECTED NOT DETECTED Final   Staphylococcus species NOT DETECTED NOT DETECTED Final   Staphylococcus aureus NOT DETECTED NOT DETECTED Final   Streptococcus species NOT DETECTED NOT DETECTED Final   Streptococcus agalactiae NOT DETECTED NOT DETECTED Final   Streptococcus pneumoniae NOT DETECTED NOT DETECTED Final   Streptococcus pyogenes NOT DETECTED NOT DETECTED Final   Acinetobacter baumannii NOT DETECTED NOT DETECTED Final   Enterobacteriaceae  species DETECTED (A) NOT DETECTED Final    Comment: CRITICAL RESULT CALLED TO, READ BACK BY AND VERIFIED WITH: M SHUDA,PHARMD AT 1828 01/15/16 BY L BENFIELD    Enterobacter cloacae complex NOT DETECTED NOT DETECTED Final   Escherichia coli DETECTED (A) NOT DETECTED Final    Comment: CRITICAL RESULT CALLED TO, READ BACK BY AND VERIFIED WITH: M SHUDA,PHARMD AT 1828 01/15/16 BY L BENFIELD    Klebsiella oxytoca NOT DETECTED NOT DETECTED Final   Klebsiella pneumoniae NOT DETECTED NOT DETECTED Final   Proteus species NOT DETECTED NOT DETECTED Final   Serratia marcescens NOT DETECTED NOT DETECTED Final   Carbapenem resistance NOT DETECTED NOT DETECTED Final   Haemophilus influenzae NOT DETECTED NOT DETECTED Final   Neisseria meningitidis NOT DETECTED NOT DETECTED Final   Pseudomonas aeruginosa NOT DETECTED NOT DETECTED Final   Candida albicans NOT DETECTED NOT DETECTED Final   Candida glabrata NOT DETECTED NOT DETECTED Final   Candida krusei NOT DETECTED NOT DETECTED Final   Candida parapsilosis NOT DETECTED NOT DETECTED Final   Candida tropicalis NOT DETECTED NOT DETECTED Final  Blood Culture (routine x 2)     Status: None (Preliminary result)   Collection Time: 01/14/16 10:47 PM  Result Value Ref Range Status   Specimen Description BLOOD RIGHT ANTECUBITAL  Final   Special Requests BOTTLES DRAWN AEROBIC AND ANAEROBIC 5CC EA  Final   Culture NO GROWTH 4 DAYS  Final   Report Status PENDING  Incomplete  MRSA PCR Screening     Status: None   Collection Time: 01/15/16  1:43 AM  Result Value Ref Range Status   MRSA by PCR NEGATIVE NEGATIVE Final    Comment:        The GeneXpert MRSA Assay (FDA approved for NASAL specimens only), is one component of a comprehensive MRSA colonization surveillance program. It is not intended to diagnose MRSA infection nor to guide or monitor treatment for MRSA infections.   Surgical pcr screen     Status: Abnormal   Collection Time: 01/17/16  6:47 PM   Result Value Ref Range Status   MRSA, PCR INVALID RESULTS, SPECIMEN SENT FOR CULTURE (A) NEGATIVE Final    Comment: RESULT CALLED TO, READ BACK BY AND VERIFIED WITH: Q LOFTIN,RN @0103  01/18/16 MKELLY,MLT    Staphylococcus aureus INVALID RESULTS, SPECIMEN SENT FOR CULTURE (A) NEGATIVE Final    Comment:        The Xpert SA Assay (FDA approved for NASAL  specimens in patients over 4 years of age), is one component of a comprehensive surveillance program.  Test performance has been validated by [facility] for patients greater than or equal to 28 year old. It is not intended to diagnose infection nor to guide or monitor treatment.   MRSA culture     Status: None   Collection Time: 01/17/16  6:47 PM  Result Value Ref Range Status   Specimen Description NASAL SWAB  Final   Special Requests NONE  Final   Culture MRSA DETECTED  Final   Report Status 01/19/2016 FINAL  Final     Time coordinating discharge: Over 30 minutes  SIGNED:   Eddie North, MD  [facility] 01/20/2016, 1:21 PM Pager   If 7PM-7AM, please contact night-coverage [email or web address] Password [email or web address]

## 2016-01-20 NOTE — PMR Pre-admission (Signed)
PMR Admission Coordinator Pre-Admission Assessment  Patient: Gordon Walker is an 50 y.o., male MRN: [id number] DOB: 23-Mar-1965 Height: 6' (182.9 cm) Weight: 78.7 kg (173 lb 9.6 oz)              Insurance Information HMO:     PPO: Yes     PCP:       IPA:       80/20:       OTHER:   PRIMARY: [employer] [place]      Policy#: [id number]      Subscriber:  Georgiann Hahn CM Name: Cyril Mourning     Phone#: [phone]    Fax#: [phone] Pre-Cert#: [id number] from 01/20/16 to 01/26/16 with update due 01/26/16      Employer: [employer] Benefits:  Phone #: [phone]     Name: Automated Eff. 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 Independent prior to admission. Wife works second shift. 2 level home with bedroom upstairs. Very limited family support.Presented 01/14/2016 with ongoing bouts of nausea and dry heaving with associated epigastric pain . Noted creatinine 2.40 with baseline 1.36. WBC 24,800. Found to be in DKA. Blood cultures Escherichia coli bacteremia Placed on broad-spectrum antibiotics simplified Ancef. IV insulin therapy. Follow-up orthopedic service Dr. Lorin Mercy for poor healing gangrenous changes right foot and limb limb was not felt to be salvageable and underwent right BKA  01/18/2016. Hospital course pain management. Rapid response 01/18/2016 reported be unresponsive patient did receive Narcan and mental status improved. Subcutaneous Lovenox for DVT prophylaxis. Acute on chronic anemia 7.0 and Transfused. Hyponatremia 129 and monitored. Physical and occupational therapy evaluations completed with recommendations of physical medicine rehabilitation consult. Patient to be admitted for a comprehensive inpatient rehabilitation program.   Past Medical History  Past Medical History:  Diagnosis Date  . Diabetes mellitus without complication (HCC)    Type 1  . Heart murmur    "years ago"  . Hypertension    "years ago"  . 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  Prior Rehab/Hospitalizations: No previous rehab admissions.  Has the patient had major surgery during 100 days prior to admission? Yes  Had right 5th ray amputation 12/30/15.  Had L TMA in June, 2017.  Current Medications   Current Facility-Administered Medications:  .  0.45 % sodium chloride infusion, , Intravenous, Continuous, Marybelle Killings, MD, Last Rate: 75 mL/hr at 01/20/16 0149 .  0.9 %  sodium chloride infusion, 250 mL,  Intravenous, PRN, Beverely Low, MD .  0.9 %  sodium chloride infusion, , Intravenous, Once, Rica Koyanagi, MD .  acetaminophen (TYLENOL) tablet 650 mg, 650 mg, Oral, Q6H PRN **OR** acetaminophen (TYLENOL) suppository 650 mg, 650 mg, Rectal, Q6H PRN, Marybelle Killings, MD .  ceFAZolin (ANCEF) IVPB 1 g/50 mL premix, 1 g, Intravenous, Q8H, Nishant Dhungel, MD, 1 g at 01/20/16 0533 .  [START ON 01/21/2016] enoxaparin (LOVENOX) injection 40 mg, 40 mg, Subcutaneous, Q24H, Nishant Dhungel, MD .  HYDROcodone-acetaminophen (NORCO) 7.5-325 MG per tablet 1-2 tablet, 1-2  tablet, Oral, Q4H PRN, Marybelle Killings, MD, 2 tablet at 01/20/16 0735 .  insulin aspart (novoLOG) injection 0-20 Units, 0-20 Units, Subcutaneous, TID WC, Marybelle Killings, MD, 3 Units at 01/20/16 [phone] .  insulin aspart (novoLOG) injection 0-5 Units, 0-5 Units, Subcutaneous, QHS, Marybelle Killings, MD, 2 Units at 01/19/16 2238 .  insulin aspart (novoLOG) injection 4 Units, 4 Units, Subcutaneous, TID WC, Marybelle Killings, MD, 4 Units at 01/20/16 1309 .  insulin glargine (LANTUS) injection 30 Units, 30 Units, Subcutaneous, QHS, Marybelle Killings, MD, 30 Units at 01/20/16 [phone] .  lactated ringers infusion, , Intravenous, Continuous, Rica Koyanagi, MD, Last Rate: 10 mL/hr at 01/18/16 1800 .  metoCLOPramide (REGLAN) tablet 5-10 mg, 5-10 mg, Oral, Q8H PRN **OR** metoCLOPramide (REGLAN) injection 5-10 mg, 5-10 mg, Intravenous, Q8H PRN, Marybelle Killings, MD .  ondansetron (ZOFRAN) tablet 4 mg, 4 mg, Oral, Q6H PRN **OR** ondansetron (ZOFRAN) injection 4 mg, 4 mg, Intravenous, Q6H PRN, Marybelle Killings, MD  Patients Current Diet: Diet Carb Modified Fluid consistency: Thin; Room service appropriate? Yes  Precautions / Restrictions Precautions Precautions: Fall Restrictions Weight Bearing Restrictions: Yes RLE Weight Bearing: Non weight bearing   Has the patient had 2 or more falls or a fall with injury in the past year?No  Prior Activity Level Community (5-7x/wk): Went out daily.  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  Home Assistive Devices / Equipment Home Assistive Devices/Equipment: None Home Equipment: Walker - 2 wheels, Other (comment), Crutches (L shoe insert)  Prior Device Use: Indicate devices/aids used by the patient prior to current illness, exacerbation or injury? Walker  Prior Functional Level Prior Function Level of Independence: Independent with assistive device(s) Comments: crutches or RW for mobility, independent with ADL  Self Care: Did the patient need help bathing, dressing, using  the toilet or eating?  Independent  Indoor Mobility: Did the patient need assistance with walking from room to room (with or without device)? Independent  Stairs: Did the patient need assistance with internal or external stairs (with or without device)? Independent  Functional Cognition: Did the patient need help planning regular tasks such as shopping or remembering to take medications? Independent  Current Functional Level Cognition  Overall Cognitive Status: Within Functional Limits for tasks assessed Orientation Level: Oriented X4    Extremity Assessment (includes Sensation/Coordination)  Upper Extremity Assessment: Overall WFL for tasks assessed  Lower Extremity Assessment: Defer to PT evaluation    ADLs  Overall ADL's : Needs assistance/impaired Eating/Feeding: Independent, Sitting Grooming: Supervision/safety, Sitting Upper Body Bathing: Set up, Supervision/ safety, Sitting Lower Body Bathing: Moderate assistance, Sit to/from stand Upper Body Dressing : Set up, Supervision/safety, Sitting Lower Body Dressing: Moderate assistance, Sit to/from stand Lower Body Dressing Details (indicate cue type and reason): Pt able to adjust sock on L foot sitting EOB with supervision.  Toilet Transfer: Moderate assistance, Stand-pivot, RW Toilet Transfer Details (indicate cue type and  reason): Simulated by stand pivot to chair. Mod assist for sit to stand and min assist for stand pivot. Functional mobility during ADLs: Moderate assistance, Rolling walker (for stand pivot only) General ADL Comments: Pt with increased pain today; declining participation in ADL but agreeable to OOB to chair.    Mobility  Overal bed mobility: Needs Assistance Bed Mobility: Supine to Sit Supine to sit: HOB elevated, Min guard General bed mobility comments: Required increased time secondary to pain.  Cues for hand placement on rail to improve ease.      Transfers  Overall transfer level: Needs  assistance Equipment used: Rolling walker (2 wheeled) Transfers: Sit to/from Stand Sit to Stand: Mod assist Stand pivot transfers: Min assist General transfer comment: Mod assist to boost up from EOB.  Cues to push from seated surface and cues tio reach back for seated surface.  Pt required cues for L foot placement with facilitation to forward weight shift.      Ambulation / Gait / Stairs / Wheelchair Mobility  Ambulation/Gait Ambulation/Gait assistance: Mod assist Ambulation Distance (Feet): 6 Feet (series of steps forward, turning and back to seated surface.  ) Assistive device: Rolling walker (2 wheeled) Gait Pattern/deviations: Narrow base of support, Decreased stride length, Step-to pattern, Trunk flexed (hop to pattern.  ) General Gait Details: Cues for upper trunk control, assist to position and turn RW.  Pt appears depressed and apprehensive to advance gait distance.   Gait velocity interpretation: Below normal speed for age/gender    Posture / Balance Balance Overall balance assessment: Needs assistance Sitting-balance support: Bilateral upper extremity supported Sitting balance-Leahy Scale: Fair Standing balance support: Bilateral upper extremity supported Standing balance-Leahy Scale: Poor Standing balance comment: RW for support    Special needs/care consideration BiPAP/CPAP No CPM No Continuous Drip IV No Dialysis No       Life Vest No Oxygen No Special Bed No    Skin New R BKA incision.  Healing L TMA incision.                             Bowel mgmt: Last BM 01/18/16 Bladder mgmt: Urinary catheter Diabetic mgmt Yes    Previous Home Environment Living Arrangements: Spouse/significant other Available Help at Discharge: Family, Available PRN/intermittently Type of Home: House Home Layout: Two level Alternate Level Stairs-Rails: Left Alternate Level Stairs-Number of Steps: flight Home Access: Stairs to enter Entrance Stairs-Rails: Right, Left, Can reach  both Entrance Stairs-Number of Steps: 4 Bathroom Toilet: [place]: No  Discharge Living Setting Plans for Discharge Living Setting: Patient's home, House, Lives with (comment) (Lives with wife.  Sister in [place] Type of Home at Discharge: House Discharge Home Layout: Two level, 1/2 bath on main level, Bed/bath upstairs Alternate Level Stairs-Number of Steps: 10-12 steps Discharge Home Access: Stairs to enter Entrance Stairs-Number of Steps: 2-4 steps Does the patient have any problems obtaining your medications?: No  Social/Family/Support Systems Patient Roles: Spouse (Has a wife, no children.) Contact Information: Achille Xiang - wife Anticipated Caregiver: self and wife Anticipated Caregiver's Contact Information: Blinda Leatherwood - wife - (h) [phone] (c) [phone] Ability/Limitations of Caregiver: Wife works 11 am to 53 pm Caregiver Availability: Intermittent Discharge Plan Discussed with Primary Caregiver: Yes Is Caregiver In Agreement with Plan?: Yes Does Caregiver/Family have Issues with Lodging/Transportation while Pt is in Rehab?: No  Goals/Additional Needs Patient/Family Goal for Rehab: PT/OT mod I goals Expected length of stay: 7 days Cultural  Considerations: None Dietary Needs: Carb mod, med cal, thin liquids Equipment Needs: TBD Pt/Family Agrees to Admission and willing to participate: Yes Program Orientation Provided & Reviewed with Pt/Caregiver Including Roles  & Responsibilities: Yes  Decrease burden of Care through IP rehab admission: N/A  Possible need for SNF placement upon discharge: Not anticipated  Patient Condition: This patient's condition remains as documented in the consult dated 01/19/16, in which the Rehabilitation Physician determined and documented that the patient's condition is appropriate for intensive rehabilitative care in an inpatient rehabilitation facility. Will admit to inpatient rehab today.   Preadmission Screen  Completed By:  [name], 01/20/2016 1:22 PM ______________________________________________________________________   Discussed status with Dr. Naaman Plummer on 01/20/16 at 1323 and received telephone approval for admission today.  Admission Coordinator:  [name], time1323/Date12/29/17

## 2016-01-20 NOTE — Progress Notes (Signed)
CSW continuing to follow for SNF in case CIR unable to admit/get approval  Burna SisJenna H. Amamda Curbow, LCSW Clinical Social Worker [phone]

## 2016-01-20 NOTE — Progress Notes (Signed)
Rehab admissions - [facility]  Awaiting response from insurance carrier regarding possible acute inpatient rehab admission.  I will update all once I hear back from insurance carrier.  Call me for questions.  #[phone]

## 2016-01-20 NOTE — Progress Notes (Signed)
Physical Therapy Treatment Patient Details Name: Gordon Walker MRN: [id number] DOB: 11-08-1965 Today's Date: 01/20/2016    History of Present Illness 50 y.o.malewith DM type 1, HTN, L transmetatarsal amputation, and peripheral vasular disease who presented with DKA. Now s/p R BKA on 12/27.    PT Comments    Pt performed transfer from bed to chair with knee immobilizer in place.  Pt appears depressed and real flat during session.  However he is motivated to progress mobility and work hard during rehab.  Post second surgery patient would benefit from skilled rehab to improve strength and promote functional independence before returning home.  Will inform supervising PT of change in recommendations.   Follow Up Recommendations  CIR     Equipment Recommendations   (TBD at next venue)    Recommendations for Other Services       Precautions / Restrictions Precautions Precautions: Fall Required Braces or Orthoses: Knee Immobilizer - Right Restrictions Weight Bearing Restrictions: Yes RLE Weight Bearing: Non weight bearing    Mobility  Bed Mobility Overal bed mobility: Needs Assistance Bed Mobility: Supine to Sit     Supine to sit: HOB elevated;Min guard     General bed mobility comments: Required increased time secondary to pain.  Cues for hand placement on rail to improve ease.    Transfers Overall transfer level: Needs assistance Equipment used: Rolling walker (2 wheeled) Transfers: Sit to/from Stand Sit to Stand: Mod assist         General transfer comment: Mod assist to boost up from EOB.  Cues to push from seated surface and cues tio reach back for seated surface.  Pt required cues for L foot placement with facilitation to forward weight shift.    Ambulation/Gait Ambulation/Gait assistance: Mod assist Ambulation Distance (Feet): 6 Feet (series of steps forward, turning and back to seated surface.  ) Assistive device: Rolling walker (2 wheeled) Gait  Pattern/deviations: Narrow base of support;Decreased stride length;Step-to pattern;Trunk flexed (hop to pattern.  )   Gait velocity interpretation: Below normal speed for age/gender General Gait Details: Cues for upper trunk control, assist to position and turn RW.  Pt appears depressed and apprehensive to advance gait distance.     Stairs            Wheelchair Mobility    Modified Rankin (Stroke Patients Only)       Balance     Sitting balance-Leahy Scale: Fair       Standing balance-Leahy Scale: Poor                      Cognition Arousal/Alertness: Awake/alert Behavior During Therapy: WFL for tasks assessed/performed Overall Cognitive Status: Within Functional Limits for tasks assessed                      Exercises      General Comments        Pertinent Vitals/Pain Pain Assessment: Faces Faces Pain Scale: Hurts little more Pain Location: RLE when tightening Knee immobilizer. Pain Descriptors / Indicators: Aching;Grimacing;Guarding;Operative site guarding Pain Intervention(s): Monitored during session;Repositioned    Home Living                      Prior Function            PT Goals (current goals can now be found in the care plan section) Acute Rehab PT Goals Patient Stated Goal: decrease pain Potential to Achieve Goals: Good Progress  towards PT goals: Progressing toward goals    Frequency    Min 4X/week      PT Plan Discharge plan needs to be updated    Co-evaluation             End of Session Equipment Utilized During Treatment: Gait belt Activity Tolerance: Patient tolerated treatment well;Patient limited by fatigue Patient left: in bed;with call bell/phone within reach;with bed alarm set;with family/visitor present     Time: [id number] PT Time Calculation (min) (ACUTE ONLY): 16 min  Charges:  $Therapeutic Activity: 8-22 mins                    G Codes:      Florestine Aversimee J Ralston Venus 01/20/2016, 10:14  AM Joycelyn RuaAimee Barabara Motz, PTA pager [phone]

## 2016-01-20 NOTE — Progress Notes (Signed)
Patient ID: Gordon CousinFrederick Walker, male   DOB: Dec 05, 1965, 50 y.o.   MRN: [id number] Patient admitted to [phone] via wheelchair, escorted by nursing staff and sister.  Patient oriented to unit, verbalized understanding of rehab process and signed fall safety agreement.  Patient appears to be in no immediate distress at this time.  Dani Gobbleeardon, Tiffiney Sparrow J, RN

## 2016-01-20 NOTE — Progress Notes (Signed)
Report called to [facility] RN. Patient transferred to 4w05, pt stable at time of transfer.

## 2016-01-20 NOTE — H&P (Signed)
Physical Medicine and Rehabilitation Admission H&P     Chief Complaint  Patient presents with  . Hyperglycemia  :  HPI: Gordon Walker is a 50 y.o. 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 Per chart review patient lives with wife. Independent prior to admission. Wife works second shift. 2 level home with bedroom upstairs. Very limited family support. Presented 01/14/2016 with ongoing bouts of nausea and dry heaving with associated epigastric pain . Noted creatinine 2.40 with baseline 1.36. WBC 24,800. Found to be in DKA. Blood cultures Escherichia coli bacteremia Placed on broad-spectrum antibiotics simplified Ancef. IV insulin therapy. Follow-up orthopedic service Dr. 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 Hospital course pain management. Rapid response 01/18/2016 reported be unresponsive patient did receive Narcan and mental status improved. Subcutaneous Lovenox for DVT prophylaxis. Acute on chronic anemia 7.0 and Transfused. Hyponatremia 129 and monitored. Physical and occupational therapy evaluations completed with recommendations of physical medicine rehabilitation consult.Patient was admitted for a comprehensive rehabilitation program  Review of Systems  Constitutional: Positive for fever. Negative for chills.  HENT: Negative for ear pain, hearing loss and tinnitus.  Respiratory: Positive for shortness of breath. Negative for cough.  Cardiovascular: Positive for leg swelling. Negative for chest pain and palpitations.  Gastrointestinal: Positive for constipation, nausea and vomiting.  GERD  Genitourinary: Positive for urgency. Negative for dysuria and hematuria.  Musculoskeletal: Positive for joint pain and myalgias.  Skin: Negative for rash.  Neurological:  Negative for seizures and weakness.  All other systems reviewed and are negative.       Past Medical History:  Diagnosis Date  . Diabetes mellitus without complication (HCC)    Type 1  . Heart murmur    "years ago"  . Hypertension    "years ago"  . 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 AMPUTATION Left 01/07/2015   Procedure: Left Foot 1st Ray Amputation; Surgeon: Nadara MustardMarcus Duda V, MD; [facility]; Service: Orthopedics; Laterality: Left;  . AMPUTATION Left 07/15/2015   Procedure: Left Transmetatarsal Amputation; Surgeon: Nadara MustardMarcus V Duda, MD; Location: [facility] OR; Service: Orthopedics; Laterality: Left;  . AMPUTATION Right 12/30/2015   Procedure: 5th Ray Amputation Right Foot; Surgeon: Nadara MustardMarcus V Duda, MD; [facility]; Service: Orthopedics; Laterality: Right;  . AMPUTATION Right 01/15/2016   Procedure: GUILLOTINE AMPUTATION ABOVE THE ANKLE AMPUTATION; Surgeon: Eldred MangesMark C Yates, MD; Location: [facility] OR; Service: Orthopedics; Laterality: Right;  . AMPUTATION Right 01/18/2016   Procedure: AMPUTATION BELOW KNEE; Surgeon: Eldred MangesMark C Yates, MD; Location: [facility] OR; Service: Orthopedics; Laterality: Right;        Family History  Problem Relation Age of Onset  . Hypertension Mother   . Diabetes Mother   . Cancer Mother   . Prostate cancer Father    Social History: reports that he has never smoked. He has never used smokeless tobacco. He reports that he does not drink alcohol or use drugs.  Allergies:      Allergies  Allergen Reactions  . No Known Allergies          Medications Prior to Admission  Medication Sig Dispense Refill  . aspirin EC 81 MG tablet Take 81 mg by  mouth daily.    Marland Kitchen. atorvastatin (LIPITOR) 20 MG tablet Take 20 mg by mouth daily.    Marland Kitchen. glucagon 1 MG injection Inject 1 mg into the muscle once as needed. (Patient taking differently: Inject 1 mg  into the muscle daily as needed (hypoglycemia). ) 1 each 12  . HYDROcodone-acetaminophen (NORCO/VICODIN) 5-325 MG tablet Take 1 tablet by mouth every 6 (six) hours as needed for moderate pain.    Marland Kitchen. insulin aspart (NOVOLOG FLEXPEN) 100 UNIT/ML FlexPen Inject 9 Units into the skin 3 (three) times daily with meals. And pen needles 3/day 15 mL 11  . insulin glargine (LANTUS) 100 UNIT/ML injection Inject 0.27 mLs (27 Units total) into the skin at bedtime. Sliding scale (Patient taking differently: Inject 0-27 Units into the skin at bedtime. Sliding scale) 10 mL 11  . lisinopril (PRINIVIL,ZESTRIL) 10 MG tablet Take 10 mg by mouth daily.     . Multiple Vitamin (MULTIVITAMIN WITH MINERALS) TABS tablet Take 1 tablet by mouth daily.    . Polyvinyl Alcohol (LUBRICANT DROPS OP) Apply 1 drop to eye daily as needed (dry eyes).     Home:  Home Living  Family/patient expects to be discharged to:: Skilled nursing facility  Living Arrangements: Spouse/significant other  Available Help at Discharge: Family, Available PRN/intermittently  Type of Home: House  Home Access: Stairs to enter  Enter[employer] CorporationEntrance Stairs-Number of Steps: 4  Entrance Stairs-Rails: Right, Left, Can reach both  Home Layout: Two level  Alternate Level Stairs-Number of Steps: flight  Alternate Level Stairs-Rails: Left  Bathroom Toilet: Standard  Home Equipment: Walker - 2 wheels, Other (comment), Crutches (L shoe insert)  Functional History:  Prior Function  Level of Independence: Independent with assistive device(s)  Comments: crutches or RW for mobility, independent with ADL  Functional Status:  Mobility:  Bed Mobility  Overal bed mobility: Needs Assistance  Bed Mobility: Supine to Sit  Supine to sit: Min assist  General bed mobility comments: Increased time but pt able to manage LEs to EOB. Min hand held assist for trunk elevation to sitting and to scoot hips out to EOB. HOB flat with use of bed rails.  Transfers  Overall transfer level:  Needs assistance  Equipment used: Rolling walker (2 wheeled)  Transfers: Sit to/from Stand, [employer] CorporationStand Pivot Transfers  Sit to Stand: Mod assist  Stand pivot transfers: Min assist  General transfer comment: Mod assist to boost up from EOB and for stand pivot to chair. Pt unsteady on L foot; reports he is typically more steady with L shoe insert that he uses at home.  Ambulation/Gait  Ambulation/Gait assistance: Min assist  Ambulation Distance (Feet): 2 Feet  Assistive device: Rolling walker (2 wheeled), 1 person hand held assist  Gait Pattern/deviations: Narrow base of support (pivot shift on LLE to chair)   ADL:  ADL  Overall ADL's : Needs assistance/impaired  Eating/Feeding: Independent, Sitting  Grooming: Supervision/safety, Sitting  Upper Body Bathing: Set up, Supervision/ safety, Sitting  Lower Body Bathing: Moderate assistance, Sit to/from stand  Upper Body Dressing : Set up, Supervision/safety, Sitting  Lower Body Dressing: Moderate assistance, Sit to/from stand  Lower Body Dressing Details (indicate cue type and reason): Pt able to adjust sock on L foot sitting EOB with supervision.  Toilet Transfer: Moderate assistance, Stand-pivot, RW  Toilet Transfer Details (indicate cue type and reason): Simulated by stand pivot to chair. Mod assist for sit to stand and min assist for stand pivot.  Functional mobility during ADLs: Moderate assistance, Rolling walker (for  stand pivot only)  General ADL Comments: Pt with increased pain today; declining participation in ADL but agreeable to OOB to chair.  Cognition:  Cognition  Overall Cognitive Status: Within Functional Limits for tasks assessed  Orientation Level: Oriented X4  Cognition  Arousal/Alertness: Awake/alert  Behavior During Therapy: WFL for tasks assessed/performed  Overall Cognitive Status: Within Functional Limits for tasks assessed  Physical Exam:  Blood pressure (!) 143/85, pulse 88, temperature 98.5 F (36.9 C), temperature  source Oral, resp. rate 17, height 6' (1.829 m), weight 78.2 kg (172 lb 4.8 oz), SpO2 100 %.  Physical Exam  Constitutional: He appears well-developed.  HENT:  Head: Normocephalic.  Eyes: EOM are normal.  Neck: Normal range of motion. Neck supple. No tracheal deviation present. No thyromegaly present.  Cardiovascular: Normal rate, regular rhythm and normal heart sounds.  Respiratory: Effort normal and breath sounds normal. No respiratory distress.  GI: Soft. Bowel sounds are normal. He exhibits no distension. There is no tenderness.  Musculoskeletal:  Right bka dressed with serosanguinous discharge. Left TMA intact.  Neurological: He is alert and oriented to person, place, and time. No cranial nerve deficit. Coordination normal.  Follows full commands. Bilateral UE 5/5. RLE limited by pain although he can lift against gravity. LLE grossly 4-5/5. Senses pain and gross touch in left foot/leg  Skin: Callus left lateral foot without breakdown  Psych: pleasant and cooperative  Lab Results Last 48 Hours  Imaging Results (Last 48 hours)     Medical Problem List and Plan:  1. 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  -admit to inpatient rehab  2. DVT Prophylaxis/Anticoagulation: Subcutaneous Lovenox. Monitor platelet counts and any signs of bleeding  3. Pain Management: Hydrocodone as needed  4. Acute blood loss anemia. Follow-up CBC over the next couple days.  5. Neuropsych: This patient is capable of making decisions on his own behalf.  6. Skin/Wound Care: Routine skin checks  7. Fluids/Electrolytes/Nutrition: Routine I&O with follow-up chemistries  8. Diabetes mellitus with peripheral neuropathy. Hemoglobin A1c 8.2. NovoLog 4 units 3 times a day with meals, Lantus insulin 30 units daily at bedtime. Check blood sugars before meals and at bedtime. Diabetic teaching  9. Constipation. Laxative assistance  10. ID: pt continues on Ancef  Post Admission Physician Evaluation:  1. Functional deficits secondary to right BKA. 2. Patient is admitted to receive collaborative, interdisciplinary care between the physiatrist, rehab nursing staff, and therapy team. 3. Patient's level of medical complexity and substantial therapy needs in context of that medical necessity cannot be provided at a lesser intensity of care such as a SNF. 4. Patient has experienced substantial functional loss from his/her baseline which was documented above under the "Functional History" and "Functional Status" headings. Judging by the patient's diagnosis, physical exam, and functional history, the patient has potential for functional progress which will result in measurable gains while on inpatient rehab. These gains will be of substantial and practical use upon discharge in facilitating mobility and self-care at the household level. 5. Physiatrist will provide 24 hour management of medical needs as well as  oversight of the therapy plan/treatment and provide guidance as appropriate regarding the interaction of the two. 6. The Preadmission Screening has been reviewed and patient status is unchanged unless otherwise stated above. 7. 24 hour rehab nursing will assist with bladder management, bowel management, safety, skin/wound care, disease management, medication administration, pain management and patient education and help integrate therapy concepts, techniques,education, etc. 8. PT will assess and treat for/with: Lower extremity strength, range of motion, stamina, balance, functional mobility, safety, adaptive techniques and equipment, pain mgt, prosthetic education, w/c use, potentially short distance gait. Goals are: mod I. 9. OT will assess and treat for/with: ADL's, functional mobility, safety, upper extremity strength, adaptive techniques and equipment, pain mgt, ego support, community reintegration. Goals are: mod I. Therapy may proceed with showering this patient. 10. SLP will assess and treat for/with: n/a. Goals are: n/a. 11. Case Management and Social Worker will assess and treat for psychological issues and discharge planning. 12. Team conference will be held weekly to assess progress toward goals and to determine barriers to discharge. 13. Patient will receive at least 3 hours of therapy per day at least 5 days per week. 14. ELOS: 7 days  15. Prognosis: excellent Ranelle Oyster, MD, [facility] Physical Medicine & Rehabilitation  01/20/2016  Charlton Amor., PA-C

## 2016-01-20 NOTE — Progress Notes (Addendum)
Inpatient Diabetes Program Recommendations  AACE/ADA: New Consensus Statement on Inpatient Glycemic Control (2015)  Target Ranges:  Prepandial:   less than 140 mg/dL      Peak postprandial:   less than 180 mg/dL (1-2 hours)      Critically ill patients:  140 - 180 mg/dL   Lab Results  Component Value Date   GLUCAP 126 (H) 01/20/2016   HGBA1C 8.2 12/28/2015   Results for Charletta CousinRANDALL, [place] (MRN [id number]) as of 01/20/2016 10:02  Ref. Range 01/19/2016 09:00 01/19/2016 12:15 01/19/2016 17:40 01/19/2016 21:06 01/20/2016 08:16  Glucose-Capillary Latest Ref Range: 65 - 99 mg/dL 562170 (H) 130175 (H) 865237 (H) 207 (H) 126 (H)   Review of Glycemic Control  Diabetes history:     DM Outpatient Diabetes medications:     Lantus 27 units QHS, Novolog 9 units TID with meals Current orders for Inpatient glycemic control:     Lantus 40 units daily,     Novolog 0-15 units TIDAC correction,     Novolog 6 units TIDAC meal coverage   Inpatient Diabetes Program Recommendations:   NOTE from Orlando PennerMarie Byrd, RN, MSN, Inpt Diabetes Coordinator on 12/26: In reviewing the chart, noted patient is followed by Dr. Everardo AllEllison (Endocrinologist) and was last seen by Dr. Everardo AllEllison on 12/28/15 at which time A1C was 8.2%. Initial glucose was 1003 mg/dl and patient was on an insulin drip but has transitioned off IV insulin to SQ insulin.    While in the hospital, noted that patient's CBG's have been well controlled on current regimen.    Please consider discharging patient on this hospital regimen to continue this control long-term.  Thank you,  Kristine LineaKaren Cassius Cullinane, RN, MSN Diabetes Coordinator Inpatient Diabetes Program [phone] (Team Pager)

## 2016-01-21 ENCOUNTER — Inpatient Hospital Stay (HOSPITAL_COMMUNITY): Payer: BLUE CROSS/BLUE SHIELD | Admitting: Physical Therapy

## 2016-01-21 ENCOUNTER — Inpatient Hospital Stay (HOSPITAL_COMMUNITY): Payer: BLUE CROSS/BLUE SHIELD | Admitting: Occupational Therapy

## 2016-01-21 LAB — TYPE AND SCREEN
ABO/RH(D): O POS
Antibody Screen: NEGATIVE
UNIT DIVISION: 0
Unit division: 0
Unit division: 0

## 2016-01-21 LAB — GLUCOSE, CAPILLARY
GLUCOSE-CAPILLARY: 152 mg/dL — AB (ref 65–99)
Glucose-Capillary: 147 mg/dL — ABNORMAL HIGH (ref 65–99)
Glucose-Capillary: 80 mg/dL (ref 65–99)
Glucose-Capillary: 153 mg/dL — ABNORMAL HIGH (ref 65–99)

## 2016-01-21 NOTE — Progress Notes (Signed)
[place] PHYSICAL MEDICINE & REHABILITATION     PROGRESS NOTE    Subjective/Complaints: Had a reasonable night. Right leg tender but tolerable with meds  ROS: pt denies nausea, vomiting, diarrhea, cough, shortness of breath or chest pain  Objective: Vital Signs: Blood pressure 128/70, pulse 85, temperature 98.3 F (36.8 C), temperature source Oral, resp. rate 18, height 6' (1.829 m), weight 80.4 kg (177 lb 3.2 oz), SpO2 100 %. No results found.  Recent Labs  01/19/16 0515 01/20/16 0718  WBC 20.8* 16.8*  HGB 8.7* 7.0*  HCT 26.0* 21.0*  PLT 305 314    Recent Labs  01/19/16 0515 01/20/16 0718  NA 134* 129*  K 3.5 3.5  CL 102 98*  GLUCOSE 103* 111*  BUN 12 8  CREATININE 0.96 0.76  CALCIUM 7.6* 6.9*   CBG (last 3)   Recent Labs  01/20/16 1643 01/20/16 2035 01/21/16 0649  GLUCAP 80 162* 152*    Wt Readings from Last 3 Encounters:  01/20/16 80.4 kg (177 lb 3.2 oz)  01/20/16 78.7 kg (173 lb 9.6 oz)  12/30/15 84.4 kg (186 lb)    Physical Exam:  Constitutional: He appears well-developed. No distress HENT:  Head: Normocephalic.  Eyes: EOM are normal.  Neck: Normal range of motion. Neck supple. No tracheal deviation present. No thyromegaly present.  Cardiovascular: RRR.  Respiratory: CTA bilaterally.  GI: Soft. Bowel sounds are normal. He exhibits no distension. There is no tenderness.  Musculoskeletal:  Right bka dressed with serosanguinous discharge. Left TMA intact.  Neurological: He is alert and oriented to person, place, and time. No cranial nerve deficit. Coordination normal.  Follows full commands. Bilateral UE 5/5. RLE: HF 3-4/5. KE limited by pain. LLE grossly 4-5/5. Senses pain and gross touch in left foot/leg  Skin: Callus left lateral foot without breakdown. Right BK incision clean with mild s/s discharge.  Psych: pleasant and cooperative   Assessment/Plan: 1. Functional deficits secondary to right BKA which require 3+ hours per day of  interdisciplinary therapy in a comprehensive inpatient rehab setting. Physiatrist is providing close team supervision and 24 hour management of active medical problems listed below. Physiatrist and rehab team continue to assess barriers to discharge/monitor patient progress toward functional and medical goals.  Function:  Bathing Bathing position      Bathing parts      Bathing assist        Upper Body Dressing/Undressing Upper body dressing                    Upper body assist        Lower Body Dressing/Undressing Lower body dressing                                  Lower body assist        Toileting Toileting Toileting activity did not occur: Safety/medical concerns (new admit, not OOB at this time)        Toileting assist     Transfers Chair/bed Optician, dispensingtransfer             Locomotion Ambulation           Wheelchair          Cognition Comprehension Comprehension assist level: Understands complex 90% of the time/cues 10% of the time  Expression Expression assist level: Expresses complex 90% of the time/cues < 10% of the time  Social Interaction Social Interaction assist level:  Interacts appropriately 75 - 89% of the time - Needs redirection for appropriate language or to initiate interaction.  Problem Solving Problem solving assist level: Solves basic problems with no assist  Memory Memory assist level: Recognizes or recalls 90% of the time/requires cueing < 10% of the time   Medical Problem List and Plan:  1. 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  -begin CIR therapies  2. DVT Prophylaxis/Anticoagulation: Subcutaneous Lovenox. Monitor platelet counts and any signs of bleeding  3. Pain Management: Hydrocodone as needed  4. Acute blood loss anemia. Follow-up CBC tomorrow.  5. Neuropsych: This patient is capable of making decisions on his own behalf.   6. Skin/Wound Care: xeroform, gauze, ACE daily prn 7. Fluids/Electrolytes/Nutrition: Routine I&O with follow-up chemistries in AM 8. Diabetes mellitus with peripheral neuropathy. Hemoglobin A1c 8.2. NovoLog 4 units 3 times a day with meals, Lantus insulin 30 units daily at bedtime.   -fair control at present 9. Constipation. Laxative assistance  10. ID: pt continues on Ancef --through 01/27/16   LOS (Days) 1 A FACE TO FACE EVALUATION WAS PERFORMED  Ranelle OysterSWARTZ,Blanka Rockholt T, MD 01/21/2016 8:53 AM

## 2016-01-21 NOTE — Progress Notes (Signed)
Physical Therapy Session Note  Patient Details  Name: Gordon Walker MRN: [id number] Date of Birth: February 15, 1965  Today's Date: 01/21/2016 PT Individual Time: 1345-1430 PT Individual Time Calculation (min): 45 min    Short Term Goals: Week 1:  PT Short Term Goal 1 (Week 1): Pt will ambulate 1' with RW and min assist PT Short Term Goal 2 (Week 1): Pt will initiate stair training with PT PT Short Term Goal 3 (Week 1): Pt will demo dynamic standing balance with min assist  Skilled Therapeutic Interventions/Progress Updates:  Pt resting in w/c reporting pain had reduced to 5/10 and agreeable to attempt gait training.  Pt propelled w/c to and from therapy gym with BUEs for cardiopulmonary endurance and UE strengthening.  Sit<>stand with min assist, though much improved in regards to coordination compared to this AM.  Gait training x30' with overall steady assist, pt demos good use of UEs to mitigate impact of LLE landing during.  Pt requesting to return to room to use bathroom, w/c propulsion in same manner as above.  Pt transferred back to bed after using urinal, supervision for squat/pivot.  PT instructed pt in BLE LAQ 2x15 reps and BLE SLR 2x15 reps.  Provided ongoing education regarding pain management through stretching, AROM, and light pressure/tapping.  Pt verbalized understanding.  Left semi reclined with call bell in reach and needs met.     Therapy Documentation Precautions: Precautions Precautions: Fall Required Braces or Orthoses: Knee Immobilizer - Right Restrictions Weight Bearing Restrictions: Yes RLE Weight Bearing: Non weight bearing   See Function Navigator for Current Functional Status.   Therapy/Group: Individual Therapy  Adelynne Joerger E Penven-Crew 01/21/2016, 3:05 PM

## 2016-01-21 NOTE — Evaluation (Signed)
Occupational Therapy Assessment and Plan  Patient Details  Name: Gordon Walker MRN: [id number] Date of Birth: Jul 19, 1965  OT Diagnosis: acute pain and muscle weakness (generalized) Rehab Potential: Rehab Potential (ACUTE ONLY): Excellent ELOS: 9-12 dyas   Today's Date: 01/21/2016  Session 1 OT Individual Time: 1000-1100 OT Individual Time Calculation (min): 60 min     Session 2 OT Individual Time: 1300-1330 OT Individual Time Calculation (min): 30 min      Problem List:  Patient Active Problem List   Diagnosis Date Noted  . Acute blood loss anemia 01/20/2016  . Amputation of right lower extremity below knee ([place]) 01/20/2016  . Hyponatremia 01/17/2016  . AKI (acute kidney injury) ([place]) 01/17/2016  . Hyperbilirubinemia 01/17/2016  . Bacteremia due to Escherichia coli 01/17/2016  . Sepsis ([place]) 01/17/2016  . DKA (diabetic ketoacidoses) ([place]) 01/15/2016  . Gas gangrene of foot ([place])   . Foot amputation status, right ([place]) 01/05/2016  . Ulcer of toe of right foot, with necrosis of bone ([place]) 12/27/2015  . Status post transmetatarsal amputation of foot, left ([place]) 11/24/2015  . Controlled diabetes mellitus with diabetic peripheral angiopathy and gangrene ([place])   . 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, with long-term current use of insulin ([place])   . Transaminitis 01/07/2015  . Osteomyelitis of left foot ([place]) 01/06/2015  . Uncontrolled diabetes mellitus with foot ulcer, with long-term current use of insulin ([place]) 01/06/2015  . Anemia 01/06/2015  . Osteomyelitis ([place]) 01/05/2015    Past Medical History:  Past Medical History:  Diagnosis Date  . Diabetes mellitus without complication (HCC)    Type 1  . Heart murmur    "years ago"  . Hypertension    "years ago"  . 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, with long-term current use of insulin (HCC)    Past Surgical History:  Past Surgical History:   Procedure Laterality Date  . AMPUTATION Left 01/07/2015   Procedure: Left Foot 1st Ray Amputation;  Surgeon: Newt Minion, MD;  Location: [facility];  Service: Orthopedics;  Laterality: Left;  . AMPUTATION Left 07/15/2015   Procedure: Left Transmetatarsal Amputation;  Surgeon: Newt Minion, MD;  Location: [facility];  Service: Orthopedics;  Laterality: Left;  . AMPUTATION Right 12/30/2015   Procedure: 5th Ray Amputation Right Foot;  Surgeon: Newt Minion, MD;  Location: [facility];  Service: Orthopedics;  Laterality: Right;  . AMPUTATION Right 01/15/2016   Procedure: GUILLOTINE AMPUTATION  ABOVE THE ANKLE AMPUTATION;  Surgeon: Marybelle Killings, MD;  Location: [facility];  Service: Orthopedics;  Laterality: Right;  . AMPUTATION Right 01/18/2016   Procedure: AMPUTATION BELOW KNEE;  Surgeon: Marybelle Killings, MD;  Location: [facility];  Service: Orthopedics;  Laterality: Right;    Assessment & Plan Clinical Impression: Patient is a 50 y.o. year old male  with history of diabetes mellitus peripheral neuropathy, hypertension, PVD osteomyelitis with left TMA 07/15/2015 as well as right fifth ray amputation 12/30/2015 . Per chart review patient lives with wife. Independent prior to admission. Wife works second shift. 2 level home with bedroom upstairs. Very limited family support. Presented 01/14/2016 with ongoing bouts of nausea and dry heaving with associated epigastric pain . Noted creatinine 2.40 with baseline 1.36. WBC 24,800. Found to be in DKA. Blood cultures Escherichia coli bacteremia Placed on broad-spectrum antibiotics simplified Ancef. IV insulin therapy. Follow-up orthopedic service Dr. Lorin Mercy for poor healing gangrenous changes right foot and limb limb was not felt to  be salvageable and underwent right BKA 01/18/2016. Hospital course pain management. Rapid response 01/18/2016 reported be unresponsive patient did receive Narcan and mental status improved.  Patient transferred to CIR on 01/20/2016 .    Patient currently  requires mod with basic self-care skills secondary to muscle weakness and decreased standing balance, decreased balance strategies and difficulty maintaining precautions.  Prior to hospitalization, patient could complete ADL and iADL with independent .  Patient will benefit from skilled intervention to increase independence with basic self-care skills prior to discharge home with care partner.  Anticipate patient will require intermittent assistance for higher level iADL tasks and follow up home health.  OT - End of Session Endurance Deficit: Yes Endurance Deficit Description: Required rest breaks during ADLs OT Assessment Rehab Potential (ACUTE ONLY): Excellent Barriers to Discharge: Inaccessible home environment;Decreased caregiver support Barriers to Discharge Comments: Spouse works during the day, bed/bath upstairs OT Patient demonstrates impairments in the following area(s): Balance;Endurance;Pain OT Basic ADL's Functional Problem(s): Grooming;Bathing;Dressing;Toileting OT Advanced ADL's Functional Problem(s): Simple Meal Preparation OT Transfers Functional Problem(s): Toilet;Tub/Shower OT Additional Impairment(s): None OT Plan OT Intensity: Minimum of 1-2 x/day, 45 to 90 minutes OT Frequency: 5 out of 7 days OT Duration/Estimated Length of Stay: 5-7 days OT Treatment/Interventions: Balance/vestibular training;Community reintegration;Discharge planning;DME/adaptive equipment instruction;Disease mangement/prevention;Functional mobility training;Pain management;Patient/family education;Self Care/advanced ADL retraining;Therapeutic Activities;Therapeutic Exercise;UE/LE Coordination activities;UE/LE Strength taining/ROM;Wheelchair propulsion/positioning OT Basic Self-Care Anticipated Outcome(s): Mod I OT Toileting Anticipated Outcome(s): Mod I OT Bathroom Transfers Anticipated Outcome(s): Mod I OT Recommendation Patient destination: Home Follow Up Recommendations: Home health OT Equipment  Recommended: Tub/shower bench  Skilled Therapeutic Intervention Session 1 Initial eval completed with treatment provided to address functional transfers, improved sit<>stand, standing tolerance, and adapted bathing/dressing skills. Squat-pivot transfers completed with overall Min A  Wc>toilet>wc>tub bench. Cues for safe technique. IV and residual limb covered prior to showering. Min A standing balance with assist to wash bottocks and pull pants over hips. Limited by pain, and required frequent rest breaks 2/2 endurance deficits. Pt left seated in recliner at end of session with needs met.   Session 2 1:1 OT focused on residual limb wrapping and functional transfers. Gave pt worksheet and practiced residual limb wrapping simulated with towels. Pt did not feel confident to practice on himself, so OT wrapped limb for pt. Transfer training completed with close supervision and education provided on proper set-up and placement of wc. Pt left with needs met.   OT Evaluation Precautions/Restrictions  Precautions Precautions: Fall Required Braces or Orthoses: Knee Immobilizer - Right Restrictions Weight Bearing Restrictions: Yes RLE Weight Bearing: Non weight bearing Pain Pain Assessment Pain Assessment: 0-10 Pain Score: 6  Pain Type: Acute pain Pain Location: Leg Pain Orientation: Right Pain Descriptors / Indicators: Burning Pain Intervention(s): RN made aware;Emotional support Multiple Pain Sites: No Home Living/Prior Functioning Home Living Family/patient expects to be discharged to:: Private residence Living Arrangements: Spouse/significant other Available Help at Discharge: Family, Available PRN/intermittently (wife works 4 days/week) Type of Home: House Home Access: Level entry Technical brewer of Steps: 4 Entrance Stairs-Rails: Right, Left, Can reach both Home Layout: Two level Alternate Level Stairs-Number of Steps: flight Alternate Level Stairs-Rails: Right Bathroom  Shower/Tub: Multimedia programmer: Standard  Lives With: Spouse IADL History Homemaking Responsibilities: Yes Current License: Yes Prior Function Level of Independence: Independent with gait, Independent with transfers  Able to Take Stairs?: Yes Driving: Yes Vocation: Full time employment Comments: has used crutches in the past ADL ADL ADL Comments: See functional navigator Vision/Perception  Vision-  History Baseline Vision/History: Wears glasses (contacts) Wears Glasses: At all times Patient Visual Report: No change from baseline Vision- Assessment Vision Assessment?: No apparent visual deficits  Cognition Overall Cognitive Status: Within Functional Limits for tasks assessed Arousal/Alertness: Awake/alert Orientation Level: Person;Place;Situation Person: Oriented Place: Oriented Situation: Oriented Year: 2017 Month: December Day of Week: Correct Memory: Appears intact Immediate Memory Recall: Sock;Blue;Bed Memory Recall: Bed;Blue;Sock Memory Recall Sock: Without Cue Memory Recall Blue: Without Cue Memory Recall Bed: Without Cue Awareness: Appears intact Sensation Sensation Light Touch: Appears Intact (pt reports mild numbness around distal/medial aspect of residual limb near incision site) Coordination Gross Motor Movements are Fluid and Coordinated: Yes Fine Motor Movements are Fluid and Coordinated: Yes Motor  Motor Motor: Other (comment) Motor - Skilled Clinical Observations: generalized weakness Mobility  Bed Mobility Bed Mobility: Supine to Sit Supine to Sit: 4: Min assist Supine to Sit Details: Verbal cues for sequencing;Verbal cues for technique Transfers Sit to Stand: 4: Min assist Sit to Stand Details: Verbal cues for technique;Verbal cues for safe use of DME/AE;Verbal cues for precautions/safety;Verbal cues for sequencing;Tactile cues for posture Stand to Sit: 4: Min guard  Trunk/Postural Assessment  Cervical Assessment Cervical  Assessment: Within Functional Limits Thoracic Assessment Thoracic Assessment: Within Functional Limits Lumbar Assessment Lumbar Assessment: Within Functional Limits Postural Control Postural Control: Deficits on evaluation Postural Limitations: decreased  Balance Balance Balance Assessed: Yes Static Standing Balance Static Standing - Balance Support: Bilateral upper extremity supported;During functional activity Static Standing - Level of Assistance: 4: Min assist Extremity/Trunk Assessment   LUE Assessment LUE Assessment: Within Functional Limits   See Function Navigator for Current Functional Status.   Refer to Care Plan for Long Term Goals  Recommendations for other services: None    Discharge Criteria: Patient will be discharged from OT if patient refuses treatment 3 consecutive times without medical reason, if treatment goals not met, if there is a change in medical status, if patient makes no progress towards goals or if patient is discharged from hospital.  The above assessment, treatment plan, treatment alternatives and goals were discussed and mutually agreed upon: by patient  Valma Cava 01/21/2016, 3:40 PM

## 2016-01-21 NOTE — Evaluation (Signed)
Physical Therapy Assessment and Plan  Patient Details  Name: Gordon Walker MRN: [id number] Date of Birth: [birth year]  PT Diagnosis: Coordination disorder, Difficulty walking and Muscle weakness Rehab Potential: Good ELOS: 14-17 days   Today's Date: 01/21/2016 PT Individual Time: 0804-0900 PT Individual Time Calculation (min): 56 min     Problem List: Patient Active Problem List   Diagnosis Date Noted  . Acute blood loss anemia 01/20/2016  . Amputation of right lower extremity below knee ([place]) 01/20/2016  . Hyponatremia 01/17/2016  . AKI (acute kidney injury) ([place]) 01/17/2016  . Hyperbilirubinemia 01/17/2016  . Bacteremia due to Escherichia coli 01/17/2016  . Sepsis ([place]) 01/17/2016  . DKA (diabetic ketoacidoses) ([place]) 01/15/2016  . Gas gangrene of foot ([place])   . Foot amputation status, right ([place]) 01/05/2016  . Ulcer of toe of right foot, with necrosis of bone ([place]) 12/27/2015  . Status post transmetatarsal amputation of foot, left ([place]) 11/24/2015  . Controlled diabetes mellitus with diabetic peripheral angiopathy and gangrene ([place])   . 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, with long-term current use of insulin ([place])   . Transaminitis 01/07/2015  . Osteomyelitis of left foot ([place]) 01/06/2015  . Uncontrolled diabetes mellitus with foot ulcer, with long-term current use of insulin ([place]) 01/06/2015  . Anemia 01/06/2015  . Osteomyelitis ([place]) 01/05/2015    Past Medical History:  Past Medical History:  Diagnosis Date  . Diabetes mellitus without complication (HCC)    Type 1  . Heart murmur    "years ago"  . Hypertension    "years ago"  . 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, with long-term current use of insulin (HCC)    Past Surgical History:  Past Surgical History:  Procedure Laterality Date  . AMPUTATION Left 01/07/2015   Procedure: Left Foot 1st Ray Amputation;  Surgeon: Newt Minion,  MD;  Location: [facility];  Service: Orthopedics;  Laterality: Left;  . AMPUTATION Left 07/15/2015   Procedure: Left Transmetatarsal Amputation;  Surgeon: Newt Minion, MD;  Location: [facility];  Service: Orthopedics;  Laterality: Left;  . AMPUTATION Right 12/30/2015   Procedure: 5th Ray Amputation Right Foot;  Surgeon: Newt Minion, MD;  Location: [facility];  Service: Orthopedics;  Laterality: Right;  . AMPUTATION Right 01/15/2016   Procedure: GUILLOTINE AMPUTATION  ABOVE THE ANKLE AMPUTATION;  Surgeon: Marybelle Killings, MD;  Location: [facility];  Service: Orthopedics;  Laterality: Right;  . AMPUTATION Right 01/18/2016   Procedure: AMPUTATION BELOW KNEE;  Surgeon: Marybelle Killings, MD;  Location: [facility];  Service: Orthopedics;  Laterality: Right;    Assessment & Plan Clinical Impression: A 50 y.o. 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 Per chart review patient lives with wife. Independent prior to admission. Wife works second shift. 2 level home with bedroom upstairs. Very limited family support. Presented 01/14/2016 with ongoing bouts of nausea and dry heaving with associated epigastric pain . Noted creatinine 2.40 with baseline 1.36. WBC 24,800. Found to be in DKA. Blood cultures Escherichia coli bacteremia Placed on broad-spectrum antibiotics simplified Ancef. IV insulin therapy. Follow-up orthopedic service Dr. 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 Hospital course pain management. Rapid response 01/18/2016 reported be unresponsive patient did receive Narcan and mental status improved. Subcutaneous Lovenox for  DVT prophylaxis. Acute on chronic anemia 7.0 and Transfused. Hyponatremia 129 and monitored. Physical and occupational therapy evaluations completed with recommendations of physical  medicine rehabilitation consult. Patient to be admitted for a comprehensive inpatient rehabilitation program.  Patient transferred to CIR on 01/20/2016 .   Patient currently requires min for transfers and static standing balance.  Expect pt will require increased levels of assist for gait and stairs but unable to assess on eval 2/2 pt increasing levels of pain.  Impairments secondary to muscle weakness, decreased cardiorespiratoy endurance and decreased sitting balance, decreased standing balance, decreased postural control and decreased balance strategies.  Prior to hospitalization, patient was independent  with mobility and lived with Spouse in a House home.  Home access is  Level entry.  Patient will benefit from skilled PT intervention to maximize safe functional mobility, minimize fall risk and decrease caregiver burden for planned discharge home with intermittent assist.  Anticipate patient will benefit from follow up OP at discharge.  PT - End of Session Activity Tolerance: Tolerates 30+ min activity with multiple rests Endurance Deficit: Yes PT Assessment Rehab Potential (ACUTE/IP ONLY): Good Barriers to Discharge: Inaccessible home environment;Decreased caregiver support PT Patient demonstrates impairments in the following area(s): Balance;Endurance;Pain PT Transfers Functional Problem(s): Bed Mobility;Bed to Chair;Car;Furniture PT Locomotion Functional Problem(s): Stairs;Ambulation;Wheelchair Mobility PT Plan PT Intensity: Minimum of 1-2 x/day ,45 to 90 minutes PT Frequency: 5 out of 7 days PT Duration Estimated Length of Stay: 14-17 days PT Treatment/Interventions: Ambulation/gait training;Discharge planning;Functional mobility training;Psychosocial support;Therapeutic Activities;Wheelchair propulsion/positioning;Therapeutic Exercise;Neuromuscular re-education;Disease management/prevention;Balance/vestibular training;DME/adaptive equipment instruction;Pain  management;Splinting/orthotics;UE/LE Strength taining/ROM;UE/LE Coordination activities;Stair training;Patient/family education;Community reintegration PT Transfers Anticipated Outcome(s): mod I PT Locomotion Anticipated Outcome(s): mod I w/c level, supervision for household gait PT Recommendation Recommendations for Other Services: Therapeutic Recreation consult Therapeutic Recreation Interventions: Stress management;Outing/community reintergration Follow Up Recommendations: Outpatient PT Patient destination: Home Equipment Recommended: To be determined  Skilled Therapeutic Intervention Pt resting in bed on arrival, initially with no c/o pain at rest but up to 6/10 with movement, RN in during session to medicate.  PT provided pt education on PT role, goals of therapy, ELOS, plan of care, and safety plan.  PT instructed pt in squat/pivot transfers and sit<>stand transfers both with overall min assist, but increased effort from pt to attain/maintain balance.  Pt declined gait training this session, will attempt to assess during PM session.  W/C mobility x200' +100' with overall supervision and initial mod verbal cues for technique.  PT provided pt education on use of light compression for pain reduction in residual limb, as well as pt education on management of phantom limb pain.  Pt verbalized understanding but education to continue throughout stay.  Pt returned to room at end of session and positioned in recliner with call bell in reach and needs met.    PT Evaluation Precautions/Restrictions Precautions Precautions: Fall Required Braces or Orthoses: Knee Immobilizer - Right Restrictions Weight Bearing Restrictions: Yes RLE Weight Bearing: Non weight bearing Home Living/Prior Functioning Home Living Available Help at Discharge: Family;Available PRN/intermittently (wife works 4 days/week) Type of Home: House Home Access: Level entry Home Layout: Two level Alternate Level Stairs-Number of  Steps: flight Alternate Level Stairs-Rails: Right  Lives With: Spouse Prior Function Level of Independence: Independent with gait;Independent with transfers  Able to Take Stairs?: Yes Driving: Yes Vocation: Full time employment Comments: has used crutches in the past  Cognition Overall Cognitive Status: Within Functional Limits for tasks assessed Arousal/Alertness: Awake/alert Orientation Level: Oriented X4 Sensation Sensation Light Touch: Appears  Intact (pt reports mild numbness around distal/medial aspect of residual limb near incision site) Coordination Gross Motor Movements are Fluid and Coordinated: Yes Fine Motor Movements are Fluid and Coordinated: Yes Motor  Motor Motor: Other (comment) Motor - Skilled Clinical Observations: generalized weakness  Mobility Bed Mobility Bed Mobility: Supine to Sit Supine to Sit: 4: Min assist Supine to Sit Details: Verbal cues for sequencing;Verbal cues for technique Transfers Transfers: Yes Sit to Stand: 4: Min assist Sit to Stand Details: Verbal cues for technique;Verbal cues for safe use of DME/AE;Verbal cues for precautions/safety;Verbal cues for sequencing;Tactile cues for posture Stand to Sit: 4: Min guard Squat Pivot Transfers: 4: Min Risk manager Details: Verbal cues for sequencing;Verbal cues for gait pattern;Verbal cues for technique;Verbal cues for precautions/safety Locomotion     Trunk/Postural Assessment  Cervical Assessment Cervical Assessment: Within Functional Limits Thoracic Assessment Thoracic Assessment: Within Functional Limits Lumbar Assessment Lumbar Assessment: Within Functional Limits Postural Control Postural Control: Deficits on evaluation Postural Limitations: decreased  Balance Balance Balance Assessed: Yes Static Standing Balance Static Standing - Balance Support: Bilateral upper extremity supported Static Standing - Level of Assistance: 4: Min assist Extremity Assessment       RLE Assessment RLE Assessment: Exceptions to [facility] RLE AROM (degrees) RLE Overall AROM Comments: decreased at hip and knee 2/2 pain and muscle guarding RLE Strength RLE Overall Strength Comments: not formally assessed 2/2 pain, able to perform decreased ROM SLR LLE Assessment LLE Assessment: Within Functional Limits LLE Strength LLE Overall Strength Comments: 5/5 throughout   See Function Navigator for Current Functional Status.   Refer to Care Plan for Long Term Goals  Recommendations for other services: None   Discharge Criteria: Patient will be discharged from PT if patient refuses treatment 3 consecutive times without medical reason, if treatment goals not met, if there is a change in medical status, if patient makes no progress towards goals or if patient is discharged from hospital.  The above assessment, treatment plan, treatment alternatives and goals were discussed and mutually agreed upon: by patient  Urban Gibson E Penven-Crew 01/21/2016, 10:07 AM

## 2016-01-22 ENCOUNTER — Inpatient Hospital Stay (HOSPITAL_COMMUNITY): Payer: BLUE CROSS/BLUE SHIELD

## 2016-01-22 ENCOUNTER — Inpatient Hospital Stay (HOSPITAL_COMMUNITY): Payer: BLUE CROSS/BLUE SHIELD | Admitting: Occupational Therapy

## 2016-01-22 ENCOUNTER — Inpatient Hospital Stay (HOSPITAL_COMMUNITY): Payer: BLUE CROSS/BLUE SHIELD | Admitting: *Deleted

## 2016-01-22 LAB — CBC WITH DIFFERENTIAL/PLATELET
BASOS PCT: 0 %
Basophils Absolute: 0 10*3/uL (ref 0.0–0.1)
EOS ABS: 0.1 10*3/uL (ref 0.0–0.7)
Eosinophils Relative: 1 %
HCT: 34.1 % — ABNORMAL LOW (ref 39.0–52.0)
HEMOGLOBIN: 11.4 g/dL — AB (ref 13.0–17.0)
Lymphocytes Relative: 11 %
Lymphs Abs: 1.8 10*3/uL (ref 0.7–4.0)
MCH: 28.6 pg (ref 26.0–34.0)
MCHC: 33.4 g/dL (ref 30.0–36.0)
MCV: 85.5 fL (ref 78.0–100.0)
Monocytes Absolute: 2.1 10*3/uL — ABNORMAL HIGH (ref 0.1–1.0)
Monocytes Relative: 13 %
NEUTROS PCT: 75 %
Neutro Abs: 12.3 10*3/uL — ABNORMAL HIGH (ref 1.7–7.7)
Platelets: 504 10*3/uL — ABNORMAL HIGH (ref 150–400)
RBC: 3.99 MIL/uL — AB (ref 4.22–5.81)
RDW: 15 % (ref 11.5–15.5)
WBC: 16.4 10*3/uL — AB (ref 4.0–10.5)

## 2016-01-22 LAB — GLUCOSE, CAPILLARY
GLUCOSE-CAPILLARY: 147 mg/dL — AB (ref 65–99)
Glucose-Capillary: 61 mg/dL — ABNORMAL LOW (ref 65–99)
Glucose-Capillary: 69 mg/dL (ref 65–99)
Glucose-Capillary: 102 mg/dL — ABNORMAL HIGH (ref 65–99)
Glucose-Capillary: 82 mg/dL (ref 65–99)

## 2016-01-22 LAB — COMPREHENSIVE METABOLIC PANEL
ALBUMIN: 1.7 g/dL — AB (ref 3.5–5.0)
ALK PHOS: 482 U/L — AB (ref 38–126)
ALT: 81 U/L — AB (ref 17–63)
AST: 106 U/L — ABNORMAL HIGH (ref 15–41)
Anion gap: 8 (ref 5–15)
BUN: 7 mg/dL (ref 6–20)
CALCIUM: 8.3 mg/dL — AB (ref 8.9–10.3)
CO2: 27 mmol/L (ref 22–32)
CREATININE: 0.81 mg/dL (ref 0.61–1.24)
Chloride: 98 mmol/L — ABNORMAL LOW (ref 101–111)
GFR calc Af Amer: >60 mL/min (ref >60)
GFR calc non Af Amer: >60 mL/min (ref >60)
GLUCOSE: 83 mg/dL (ref 65–99)
Potassium: 3.9 mmol/L (ref 3.5–5.1)
SODIUM: 133 mmol/L — AB (ref 135–145)
Total Bilirubin: 1 mg/dL (ref 0.3–1.2)
Total Protein: 6.3 g/dL — ABNORMAL LOW (ref 6.5–8.1)

## 2016-01-22 NOTE — Progress Notes (Signed)
Occupational Therapy Session Note  Patient Details  Name: Gordon Walker MRN: [id number] Date of Birth: November 21, 1965  Today's Date: 01/22/2016 OT Individual Time: 0800-0900 OT Individual Time Calculation (min): 60 min   Short Term Goals: Week 1:  OT Short Term Goal 1 (Week 1): LTG=STG 2/2 esitmated short LOS  Skilled Therapeutic Interventions/Progress Updates:   ADL-retraining at shower level with focus on improved static/dynamic standing balance, safety awareness and adapted bathing/dressing skills.   Pt received supine in bed having finished his breakfast.  With setup to place w/c, pt completed bed mobility and lateral scoot transfer to w/c with only steadying assist for safety.   Pt requested to toilet and performed similar transfer to toilet.   Pt left unattended during toileting but reported nose bleed as remained nearby with request for saline spray to resolve nose bleed.    RN made aware and pt continued session, performing transfer to bench with supervision and steadying assist and bathing unassisted while seated, using lateral leans to wash buttocks.   Pt recovered to w/c and dressed at sink side with min assist to pull up pants and shorts over his hips as he stood supported at sink.   Pt groomed at sink with setup and elected to remain in w/c at end of session with all needs placed within reach.    Overall, pt requires intermittent vc to lock his brakes before standing and min assist for lower body dressing and transfers.  Therapy Documentation Precautions:  Precautions Precautions: Fall Required Braces or Orthoses: Knee Immobilizer - Right Restrictions Weight Bearing Restrictions: Yes RLE Weight Bearing: Non weight bearing   Pain: Pain Assessment Pain Assessment: 0-10 Pain Score: 2  Pain Type: Surgical pain Pain Location: Leg Pain Orientation: Right Pain Descriptors / Indicators: Aching Pain Frequency: Intermittent Pain Onset: With Activity Pain Intervention(s):  Medication (See eMAR)   ADL: ADL ADL Comments: See functional navigator   See Function Navigator for Current Functional Status.   Therapy/Group: Individual Therapy  Gordon Walker 01/22/2016, 8:31 AM

## 2016-01-22 NOTE — Progress Notes (Signed)
Occupational Therapy Session Note  Patient Details  Name: Gordon Walker MRN: [id number] Date of Birth: [birth year]  Today's Date: 01/22/2016 OT Individual Time: [id number] OT Individual Time Calculation (min): 47 min    Short Term Goals: Week 1:  OT Short Term Goal 1 (Week 1): LTG=STG 2/2 esitmated short LOS    Skilled Therapeutic Interventions/Progress Updates:   Pt was seen in recliner for scheduled OT and was agreeable to tx. Per pt, his lunch tray had not arrived when scheduled and he was upset. Pt expressed that he was "fed up" with his hospital course and hospital bill expenditures, appeared overwhelmed. OT provided therapeutic listening and validated his concerns. Pt educated on OT POC to increase volition and self efficacy. Tx focus was then on residual limb wrapping and education regarding foot and residual limb inspection for diabetes mgt. With visual demonstrations and verbal instruction, pt completed wrapping/unwraping of residual limb multiple times with figure 8 technique. Emphasis on wrapping over knee with use of 2 ACE wraps to promote extension and to wrap limb x3 daily. Pt inspected residual limb with LH mirror to assess whether ACE wraps had covered limb completely. Discussed using LH mirror for daily foot and residual limb inspection at home. Pt verbalized understanding. Pt also expressed confusion with KI, reported not being sure when to wear it. Note left for MD to address pts concern. Afterwards pt was left in recliner with all needs within reach at time of departure.   Therapy Documentation Precautions:  Precautions Precautions: Fall Required Braces or Orthoses: Knee Immobilizer - Right Restrictions Weight Bearing Restrictions: Yes RLE Weight Bearing: Non weight bearing General:     Pain: No c/o pain during session  Pain Assessment Pain Assessment: 0-10 Pain Score: 3  Pain Type: Acute pain;Surgical pain Pain Location: Leg Pain Orientation: Right Pain  Descriptors / Indicators: Aching Pain Frequency: Intermittent Pain Onset: With Activity Pain Intervention(s): Medication (See eMAR) ADL: ADL ADL Comments: See functional navigator    See Function Navigator for Current Functional Status.   Therapy/Group: Individual Therapy  Holleigh Crihfield A Tayden Nichelson 01/22/2016, 7:30 PM

## 2016-01-22 NOTE — Progress Notes (Signed)
Hypoglycemic Event  CBG: 61  Treatment: 15 GM carbohydrate snack  Symptoms: None  Follow-up CBG: Time:0718 CBG Result:69  Possible Reasons for Event: Unknown  Comments/MD notified:Pt asymptomatic, apple juice given    [place], Phill MutterMelissa Rebecca

## 2016-01-22 NOTE — Progress Notes (Signed)
[name]IL[facility]     PROGRESS NOTE    Subjective/Complaints: States he's doing ok. "had an ordinary day yesterday"  Pain is controlled  ROS: pt denies nausea, vomiting, diarrhea, cough, shortness of breath or chest pain   Objective: Vital Signs: Blood pressure (!) 157/79, pulse 92, temperature 98.5 F (36.9 C), temperature source Oral, resp. rate 18, height 6' (1.829 m), weight 79 kg (174 lb 3.2 oz), SpO2 98 %. No results found.  Recent Labs  01/20/16 0718  WBC 16.8*  HGB 7.0*  HCT 21.0*  PLT 314    Recent Labs  01/20/16 0718  NA 129*  K 3.5  CL 98*  GLUCOSE 111*  BUN 8  CREATININE 0.76  CALCIUM 6.9*   CBG (last 3)   Recent Labs  01/21/16 2124 01/22/16 0658 01/22/16 0719  GLUCAP 153* 61* 69    Wt Readings from Last 3 Encounters:  01/22/16 79 kg (174 lb 3.2 oz)  01/20/16 78.7 kg (173 lb 9.6 oz)  12/30/15 84.4 kg (186 lb)    Physical Exam:  Constitutional: He appears well-developed. No distress HENT:  Head: Normocephalic.  Eyes: EOM are normal.  Neck: Normal range of motion. Neck supple. No tracheal deviation present. No thyromegaly present.  Cardiovascular: RRR.  Respiratory: CTA bilaterally.  GI: Soft. Bowel sounds are normal. He exhibits no distension. There is no tenderness.  Musculoskeletal:  Right bka with sl serosang drainage. Left TMA intact.  Neurological: He is alert and oriented to person, place, and time. No cranial nerve deficit. Coordination normal.  Follows full commands. Bilateral UE 5/5. RLE: HF 3-4/5. KE limited by pain. LLE grossly 4-5/5. Senses pain and gross touch in left foot/leg  Skin: Callus left lateral foot without breakdown. Right BK incision clean with mild s/s discharge.  Psych: verty flat but cooperative   Assessment/Plan: 1. Functional deficits secondary to right BKA which require 3+ hours per day of interdisciplinary therapy in a comprehensive inpatient rehab setting. Physiatrist is  providing close team supervision and 24 hour management of active medical problems listed below. Physiatrist and rehab team continue to assess barriers to discharge/monitor patient progress toward functional and medical goals.  Function:  Bathing Bathing position   Position: Shower  Bathing parts Body parts bathed by patient: Left arm, Right arm, Chest, Abdomen, Front perineal area, Right upper leg, Left upper leg, Left lower leg Body parts bathed by helper: Back, Buttocks  Bathing assist Assist Level: Touching or steadying assistance(Pt > 75%)      Upper Body Dressing/Undressing Upper body dressing   What is the patient wearing?: Pull over shirt/dress     Pull over shirt/dress - Perfomed by patient: Thread/unthread right sleeve, Thread/unthread left sleeve, Put head through opening, Pull shirt over trunk          Upper body assist Assist Level: Set up   Set up : To obtain clothing/put away  Lower Body Dressing/Undressing Lower body dressing   What is the patient wearing?: Pants, Underwear, Socks, Shoes Underwear - Performed by patient: Thread/unthread left underwear leg Underwear - Performed by helper: Thread/unthread right underwear leg, Pull underwear up/down Pants- Performed by patient: Thread/unthread left pants leg Pants- Performed by helper: Thread/unthread right pants leg, Pull pants up/down     Socks - Performed by patient: Don/doff left sock   Shoes - Performed by patient: Don/doff left shoe            Lower body assist Assist for lower body dressing: Touching  or steadying assistance (Pt > 75%)      Toileting Toileting Toileting activity did not occur: No continent bowel/bladder event Toileting steps completed by patient: Adjust clothing prior to toileting, Performs perineal hygiene, Adjust clothing after toileting      Toileting assist Assist level: No help/no cues   Transfers Chair/bed transfer   Chair/bed transfer method: Squat pivot Chair/bed  transfer assist level: Touching or steadying assistance (Pt > 75%) Chair/bed transfer assistive device: Armrests     Locomotion Ambulation     Max distance: 30 Assist level: Touching or steadying assistance (Pt > 75%)   Wheelchair   Type: Manual Max wheelchair distance: 150 Assist Level: Supervision or verbal cues  Cognition Comprehension Comprehension assist level: Understands complex 90% of the time/cues 10% of the time  Expression Expression assist level: Expresses complex 90% of the time/cues < 10% of the time  Social Interaction Social Interaction assist level: Interacts appropriately 75 - 89% of the time - Needs redirection for appropriate language or to initiate interaction.  Problem Solving Problem solving assist level: Solves basic problems with no assist  Memory Memory assist level: Recognizes or recalls 90% of the time/requires cueing < 10% of the time   Medical Problem List and Plan:  1. 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  -continue CIR therapies   2. DVT Prophylaxis/Anticoagulation: Subcutaneous Lovenox. Monitor platelet counts and any signs of bleeding  3. Pain Management: Hydrocodone as needed  4. Acute blood loss anemia. hgb 7 yesterday. Today's labs pending.  5. Neuropsych: This patient is capable of making decisions on his own behalf.   -team to provide ego support as needed---very flat 6. Skin/Wound Care: xeroform, gauze, ACE daily prn 7. Fluids/Electrolytes/Nutrition: encourage po  -labs pending today 8. Diabetes mellitus with peripheral neuropathy. Hemoglobin A1c 8.2. NovoLog 4 units 3 times a day with meals, Lantus insulin 30 units daily at bedtime.   -fair control at present--watch for persistent hypoglycemia 9. Constipation. Laxative assistance  10. ID: pt continues on Ancef --through 01/27/16   LOS (Days) 2 A FACE TO FACE EVALUATION WAS  PERFORMED  Ranelle OysterSWARTZ,Caralina Nop T, MD 01/22/2016 8:24 AM

## 2016-01-23 LAB — GLUCOSE, CAPILLARY
GLUCOSE-CAPILLARY: 92 mg/dL (ref 65–99)
GLUCOSE-CAPILLARY: 75 mg/dL (ref 65–99)
GLUCOSE-CAPILLARY: 72 mg/dL (ref 65–99)
Glucose-Capillary: 119 mg/dL — ABNORMAL HIGH (ref 65–99)

## 2016-01-23 NOTE — IPOC Note (Addendum)
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) Active Problems:   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, with long-term current use of insulin (HCC)   Acute blood loss anemia     Functional Problem List: Nursing Bowel, Bladder, Edema, Endurance, Medication Management, Motor, Nutrition, Pain, Sensory, Skin Integrity  PT Balance, Endurance, Pain  OT Balance, Endurance, Pain  SLP    TR         Basic ADL's: OT Grooming, Bathing, Dressing, Toileting     Advanced  ADL's: OT Simple Meal Preparation     Transfers: PT Bed Mobility, Bed to Chair, Car, Occupational psychologist, Research scientist (life sciences): PT Stairs, Ambulation, Psychologist, prison and probation services     Additional Impairments: OT None  SLP        TR      Anticipated Outcomes Item Anticipated Outcome  Self Feeding    Swallowing      Basic self-care  Mod I  Toileting  Mod I   Bathroom Transfers Mod I  Bowel/Bladder  Mod I  Transfers  mod I  Locomotion  mod I w/c level, supervision for household gait  Communication     Cognition     Pain  < 3  Safety/Judgment  Mod I   Therapy Plan: PT Intensity: Minimum of 1-2 x/day ,45 to 90 minutes PT Frequency: 5 out of 7 days PT Duration Estimated Length of Stay: 14-17 days OT Intensity: Minimum of 1-2 x/day, 45 to 90 minutes OT Frequency: 5 out of 7 days OT Duration/Estimated Length of Stay: 9-12 days         Team Interventions: Nursing Interventions Patient/Family Education, Skin Care/Wound Management, Disease Management/Prevention, Pain Management, Bladder Management, Bowel Management, Medication Management  PT interventions Ambulation/gait training, Discharge planning, Functional mobility training, Psychosocial support, Therapeutic Activities, Wheelchair  propulsion/positioning, Therapeutic Exercise, Neuromuscular re-education, Disease management/prevention, Warden/ranger, DME/adaptive equipment instruction, Pain management, Splinting/orthotics, UE/LE Strength taining/ROM, UE/LE Coordination activities, Stair training, Patient/family education, Community reintegration  OT Interventions Warden/ranger, Firefighter, Discharge planning, Fish farm manager, Disease mangement/prevention, Functional mobility training, Pain management, Patient/family education, Self Care/advanced ADL retraining, Therapeutic Activities, Therapeutic Exercise, UE/LE Coordination activities, UE/LE Strength taining/ROM, Wheelchair propulsion/positioning  SLP Interventions    TR Interventions    SW/CM Interventions Discharge Planning, Facilities manager, Patient/Family Education    Team Discharge Planning: Destination: PT-Home ,OT- Home , SLP-  Projected Follow-up: PT-Outpatient PT, OT-  Home health OT, SLP-  Projected Equipment Needs: PT-To be determined, OT- Tub/shower bench, SLP-  Equipment Details: PT- , OT-  Patient/family involved in discharge planning: PT- Patient,  OT-Patient, SLP-   MD ELOS: 7d Medical Rehab Prognosis:  Excellent Assessment: 51 y.o. 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 Per chart review patient lives with wife. Independent prior to admission. Wife works second shift. 2 level home with bedroom upstairs. Very limited family support. Presented 01/14/2016 with ongoing bouts of nausea and dry heaving with associated epigastric pain . Noted creatinine 2.40 with baseline 1.36. WBC 24,800. Found to be in DKA. Blood cultures Escherichia coli bacteremia Placed on broad-spectrum antibiotics simplified Ancef. IV insulin therapy. Follow-up orthopedic service Dr. Ophelia Charter  for poor healing gangrenous changes right foot and limb limb was  not felt to be salvageable and underwent right BKA 01/18/2016. Hospital course pain management. Rapid response 01/18/2016 reported be unresponsive patient did receive Narcan and mental status improved. Subcutaneous Lovenox for DVT prophylaxis. Acute on chronic anemia 7.0 and Transfused    Now requiring 24/7 Rehab RN,MD, as well as CIR level PT, OT.  Treatment team will focus on ADLs and mobility [facility] See Team Conference Notes for weekly updates to the plan of care

## 2016-01-23 NOTE — Progress Notes (Signed)
[place] MEDICINE & REHABILITATION     PROGRESS NOTE    Subjective/Complaints: No new complaints. Feels he's able to manage pain fairly well.   ROS: pt denies nausea, vomiting, diarrhea, cough, shortness of breath or chest pain  Objective: Vital Signs: Blood pressure (!) 162/80, pulse 91, temperature 98.2 F (36.8 C), temperature source Oral, resp. rate 18, height 6' (1.829 m), weight 80.3 kg (177 lb 1.6 oz), SpO2 99 %. No results found.  Recent Labs  01/22/16 0747  WBC 16.4*  HGB 11.4*  HCT 34.1*  PLT 504*    Recent Labs  01/22/16 0747  NA 133*  K 3.9  CL 98*  GLUCOSE 83  BUN 7  CREATININE 0.81  CALCIUM 8.3*   CBG (last 3)   Recent Labs  01/22/16 2208 01/23/16 0710 01/23/16 1144  GLUCAP 82 92 75    Wt Readings from Last 3 Encounters:  01/23/16 80.3 kg (177 lb 1.6 oz)  01/20/16 78.7 kg (173 lb 9.6 oz)  12/30/15 84.4 kg (186 lb)    Physical Exam:  Constitutional: He appears well-developed. No distress HENT:  Head: Normocephalic.  Eyes: EOM are normal.  Neck: Normal range of motion. Neck supple. No tracheal deviation present. No thyromegaly present.  Cardiovascular: RRR wihtout JVD.  Respiratory: CTA bilaterally.  GI: Soft. Bowel sounds are normal. He exhibits no distension. There is no tenderness.  Musculoskeletal:  Right bka with sl serosang drainage. Left TMA intact.  Neurological: He is alert and oriented to person, place, and time. No cranial nerve deficit. Coordination normal.  Follows full commands. Bilateral UE 5/5. RLE: HF 3-4/5. KE limited by pain. LLE grossly 4-5/5. Senses pain and gross touch in left foot/leg  Skin: Callus left lateral foot without breakdown. Right BK incision clean with mild s/s drainage centrally. Well approximated.  Psych: very flat but cooperative   Assessment/Plan: 1. Functional deficits secondary to right BKA which require 3+ hours per day of interdisciplinary therapy in a comprehensive inpatient rehab  setting. Physiatrist is providing close team supervision and 24 hour management of active medical problems listed below. Physiatrist and rehab team continue to assess barriers to discharge/monitor patient progress toward functional and medical goals.  Function:  Bathing Bathing position   Position: Shower  Bathing parts Body parts bathed by patient: Right arm, Left arm, Chest, Abdomen, Front perineal area, Buttocks, Right upper leg, Left upper leg, Left lower leg Body parts bathed by helper: Back, Buttocks  Bathing assist Assist Level: Touching or steadying assistance(Pt > 75%)      Upper Body Dressing/Undressing Upper body dressing   What is the patient wearing?: Pull over shirt/dress     Pull over shirt/dress - Perfomed by patient: Thread/unthread right sleeve, Thread/unthread left sleeve, Put head through opening, Pull shirt over trunk          Upper body assist Assist Level: Set up   Set up : To obtain clothing/put away  Lower Body Dressing/Undressing Lower body dressing   What is the patient wearing?: Underwear, Pants, Non-skid slipper socks Underwear - Performed by patient: Thread/unthread right underwear leg, Thread/unthread left underwear leg Underwear - Performed by helper: Pull underwear up/down Pants- Performed by patient: Thread/unthread right pants leg, Thread/unthread left pants leg Pants- Performed by helper: Pull pants up/down     Socks - Performed by patient: Don/doff left sock   Shoes - Performed by patient: Don/doff left shoe            Lower body assist Assist  for lower body dressing: Touching or steadying assistance (Pt > 75%)      Toileting Toileting Toileting activity did not occur: No continent bowel/bladder event Toileting steps completed by patient: Adjust clothing prior to toileting, Performs perineal hygiene, Adjust clothing after toileting      Toileting assist Assist level: Supervision or verbal cues   Transfers Chair/bed transfer    Chair/bed transfer method: Squat pivot Chair/bed transfer assist level: Touching or steadying assistance (Pt > 75%) Chair/bed transfer assistive device: Bedrails, Armrests     Locomotion Ambulation     Max distance: 30 Assist level: Touching or steadying assistance (Pt > 75%)   Wheelchair   Type: Manual Max wheelchair distance: 150 Assist Level: Supervision or verbal cues  Cognition Comprehension Comprehension assist level: Understands complex 90% of the time/cues 10% of the time  Expression Expression assist level: Expresses complex 90% of the time/cues < 10% of the time  Social Interaction Social Interaction assist level: Interacts appropriately 75 - 89% of the time - Needs redirection for appropriate language or to initiate interaction.  Problem Solving Problem solving assist level: Solves basic problems with no assist  Memory Memory assist level: Recognizes or recalls 90% of the time/requires cueing < 10% of the time   Medical Problem List and Plan:  1. 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  -continue OT, PT 2. DVT Prophylaxis/Anticoagulation: Subcutaneous Lovenox. Monitor platelet counts and any signs of bleeding  3. Pain Management: Hydrocodone as needed with control 4. Acute blood loss anemia. hgb 7 yesterday. Today's labs pending.  5. Neuropsych: This patient is capable of making decisions on his own behalf.   -team to provide ego support as needed---very flat 6. Skin/Wound Care: xeroform, gauze, ACE daily can continue 7. Fluids/Electrolytes/Nutrition: encourage po  -labs with improvement yesterday 8. Diabetes mellitus with peripheral neuropathy. Hemoglobin A1c 8.2. NovoLog 4 units 3 times a day with meals, Lantus insulin 30 units daily at bedtime.   -reasonable control. No changes 9. Constipation. Laxative assistance  10. ID: pt continues on Ancef --through 01/27/16   LOS  (Days) 3 A FACE TO FACE EVALUATION WAS PERFORMED  Ranelle Oyster, MD 01/23/2016 1:13 PM

## 2016-01-24 ENCOUNTER — Inpatient Hospital Stay (HOSPITAL_COMMUNITY): Payer: BLUE CROSS/BLUE SHIELD | Admitting: Physical Therapy

## 2016-01-24 ENCOUNTER — Inpatient Hospital Stay (HOSPITAL_COMMUNITY): Payer: BLUE CROSS/BLUE SHIELD | Admitting: Occupational Therapy

## 2016-01-24 LAB — GLUCOSE, CAPILLARY
GLUCOSE-CAPILLARY: 82 mg/dL (ref 65–99)
GLUCOSE-CAPILLARY: 77 mg/dL (ref 65–99)
GLUCOSE-CAPILLARY: 138 mg/dL — AB (ref 65–99)
Glucose-Capillary: 56 mg/dL — ABNORMAL LOW (ref 65–99)
Glucose-Capillary: 62 mg/dL — ABNORMAL LOW (ref 65–99)
Glucose-Capillary: 70 mg/dL (ref 65–99)
Glucose-Capillary: 62 mg/dL — ABNORMAL LOW (ref 65–99)
Glucose-Capillary: 92 mg/dL (ref 65–99)

## 2016-01-24 NOTE — Progress Notes (Signed)
[name], RN Rehab Admission Coordinator Signed Physical Medicine and Rehabilitation  PMR Pre-admission Date of Service: 01/20/2016 12:37 PM  Related encounter: ED to Hosp-Admission (Discharged) from 01/14/2016 in [place]       [] Hide copied text PMR Admission Coordinator Pre-Admission Assessment  Patient: Gordon Walker is an 51 y.o., male MRN: [id number] DOB: 07-16-1965 Height: 6' (182.9 cm) Weight: 78.7 kg (173 lb 9.6 oz)                                                                                                                                                  Insurance Information HMO:     PPO: Yes     PCP:       IPA:       80/20:       OTHER:   PRIMARY: [employer] [place]      Policy#: [id number]      Subscriber:  Georgiann Hahn CM Name: Cyril Mourning     Phone#: [phone]    Fax#: [phone] Pre-Cert#: [id number] from 01/20/16 to 01/26/16 with update due 01/26/16      Employer: [employer] Benefits:  Phone #: [phone]     Name: Automated Eff. 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 Independent prior to admission. Wife works second shift. 2 level home with bedroom upstairs. Very limited family support.Presented  01/14/2016 with ongoing bouts of nausea and dry heaving with associated epigastric pain . Noted creatinine 2.40 with baseline 1.36. WBC 24,800. Found to be in DKA.Blood cultures Escherichia coli bacteremiaPlaced on broad-spectrum antibioticssimplified Ancef. IV insulin therapy. Follow-up orthopedic service Dr. 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 Hospital course pain management. Rapid response 01/18/2016 reported be unresponsive patient did receive Narcan and mental status improved. Subcutaneous Lovenox for DVT prophylaxis. Acute on chronic anemia 7.0and Transfused.Hyponatremia 129and monitored.Physical and occupational therapy evaluations completed with recommendations of  physical medicine rehabilitation consult. Patient to be admitted for a comprehensive inpatient rehabilitation program.   Past Medical History      Past Medical History:  Diagnosis Date  . Diabetes mellitus without complication (HCC)    Type 1  . Heart murmur    "years ago"  . Hypertension    "years ago"  . 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  Prior Rehab/Hospitalizations: No previous rehab admissions.  Has the patient had major surgery during 100 days prior to admission? Yes  Had right 5th ray amputation 12/30/15.  Had L TMA in June, 2017.  Current Medications   Current Facility-Administered Medications:  .  0.45 % sodium chloride infusion, , Intravenous, Continuous, Marybelle Killings, MD, Last Rate: 75 mL/hr at 01/20/16 0149 .  0.9 %  sodium chloride infusion, 250 mL, Intravenous, PRN, Beverely Low, MD .  0.9 %  sodium chloride infusion, , Intravenous, Once, Rica Koyanagi, MD .   acetaminophen (TYLENOL) tablet 650 mg, 650 mg, Oral, Q6H PRN **OR** acetaminophen (TYLENOL) suppository 650 mg, 650 mg, Rectal, Q6H PRN, Marybelle Killings, MD .  ceFAZolin (ANCEF) IVPB 1 g/50 mL premix, 1 g, Intravenous, Q8H, Nishant Dhungel, MD, 1 g at 01/20/16 0533 .  [START ON 01/21/2016] enoxaparin (LOVENOX) injection 40 mg, 40 mg, Subcutaneous, Q24H, Nishant Dhungel, MD .  HYDROcodone-acetaminophen (NORCO) 7.5-325 MG per tablet 1-2 tablet, 1-2 tablet, Oral, Q4H PRN, Marybelle Killings, MD, 2 tablet at 01/20/16 0735 .  insulin aspart (novoLOG) injection 0-20 Units, 0-20 Units, Subcutaneous, TID WC, Marybelle Killings, MD, 3 Units at 01/20/16 [phone] .  insulin aspart (novoLOG) injection 0-5 Units, 0-5 Units, Subcutaneous, QHS, Marybelle Killings, MD, 2 Units at 01/19/16 2238 .  insulin aspart (novoLOG) injection 4 Units, 4 Units, Subcutaneous, TID WC, Marybelle Killings, MD, 4 Units at 01/20/16 1309 .  insulin glargine (LANTUS) injection 30 Units, 30 Units, Subcutaneous, QHS, Marybelle Killings, MD, 30 Units at 01/20/16 [phone] .  lactated ringers infusion, , Intravenous, Continuous, Rica Koyanagi, MD, Last Rate: 10 mL/hr at 01/18/16 1800 .  metoCLOPramide (REGLAN) tablet 5-10 mg, 5-10 mg, Oral, Q8H PRN **OR** metoCLOPramide (REGLAN) injection 5-10 mg, 5-10 mg, Intravenous, Q8H PRN, Marybelle Killings, MD .  ondansetron (ZOFRAN) tablet 4 mg, 4 mg, Oral, Q6H PRN **OR** ondansetron (ZOFRAN) injection 4 mg, 4 mg, Intravenous, Q6H PRN, Marybelle Killings, MD  Patients Current Diet: Diet Carb Modified Fluid consistency: Thin; Room service appropriate? Yes  Precautions / Restrictions Precautions Precautions: Fall Restrictions Weight Bearing Restrictions: Yes RLE Weight Bearing: Non weight bearing   Has the patient had 2 or more falls or a fall with injury in the past year?No  Prior Activity Level Community (5-7x/wk): Went out daily.  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  Home Assistive Devices / Equipment Home  Assistive Devices/Equipment: None Home Equipment: Walker - 2 wheels, Other (comment), Crutches (L shoe insert)  Prior Device Use: Indicate devices/aids used by the patient prior to current illness, exacerbation or injury? Walker  Prior Functional Level Prior Function Level of Independence: Independent with assistive device(s) Comments: crutches or RW for mobility, independent with ADL  Self Care: Did the patient need help bathing, dressing, using the toilet or eating?  Independent  Indoor  Mobility: Did the patient need assistance with walking from room to room (with or without device)? Independent  Stairs: Did the patient need assistance with internal or external stairs (with or without device)? Independent  Functional Cognition: Did the patient need help planning regular tasks such as shopping or remembering to take medications? Independent  Current Functional Level Cognition Overall Cognitive Status: Within Functional Limits for tasks assessed Orientation Level: Oriented X4    Extremity Assessment (includes Sensation/Coordination) Upper Extremity Assessment: Overall WFL for tasks assessed  Lower Extremity Assessment: Defer to PT evaluation   ADLs Overall ADL's : Needs assistance/impaired Eating/Feeding: Independent, Sitting Grooming: Supervision/safety, Sitting Upper Body Bathing: Set up, Supervision/ safety, Sitting Lower Body Bathing: Moderate assistance, Sit to/from stand Upper Body Dressing : Set up, Supervision/safety, Sitting Lower Body Dressing: Moderate assistance, Sit to/from stand Lower Body Dressing Details (indicate cue type and reason): Pt able to adjust sock on L foot sitting EOB with supervision.  Toilet Transfer: Moderate assistance, Stand-pivot, RW Toilet Transfer Details (indicate cue type and reason): Simulated by stand pivot to chair. Mod assist for sit to stand and min assist for stand pivot. Functional mobility during ADLs: Moderate assistance,  Rolling walker (for stand pivot only) General ADL Comments: Pt with increased pain today; declining participation in ADL but agreeable to OOB to chair.   Mobility Overal bed mobility: Needs Assistance Bed Mobility: Supine to Sit Supine to sit: HOB elevated, Min guard General bed mobility comments: Required increased time secondary to pain.  Cues for hand placement on rail to improve ease.     Transfers Overall transfer level: Needs assistance Equipment used: Rolling walker (2 wheeled) Transfers: Sit to/from Stand Sit to Stand: Mod assist Stand pivot transfers: Min assist General transfer comment: Mod assist to boost up from EOB.  Cues to push from seated surface and cues tio reach back for seated surface.  Pt required cues for L foot placement with facilitation to forward weight shift.     Ambulation / Gait / Stairs / Wheelchair Mobility Ambulation/Gait Ambulation/Gait assistance: Mod assist Ambulation Distance (Feet): 6 Feet (series of steps forward, turning and back to seated surface.  ) Assistive device: Rolling walker (2 wheeled) Gait Pattern/deviations: Narrow base of support, Decreased stride length, Step-to pattern, Trunk flexed (hop to pattern.  ) General Gait Details: Cues for upper trunk control, assist to position and turn RW.  Pt appears depressed and apprehensive to advance gait distance.   Gait velocity interpretation: Below normal speed for age/gender   Posture / Balance Balance Overall balance assessment: Needs assistance Sitting-balance support: Bilateral upper extremity supported Sitting balance-Leahy Scale: Fair Standing balance support: Bilateral upper extremity supported Standing balance-Leahy Scale: Poor Standing balance comment: RW for support   Special needs/care consideration BiPAP/CPAP No CPM No Continuous Drip IV No Dialysis No       Life Vest No Oxygen No Special Bed No    Skin New R BKA incision.  Healing L TMA incision.                             Bowel  mgmt: Last BM 01/18/16 Bladder mgmt: Urinary catheter Diabetic mgmt Yes   Previous Home Environment Living Arrangements: Spouse/significant other Available Help at Discharge: Family, Available PRN/intermittently Type of Home: House Home Layout: Two level Alternate Level Stairs-Rails: Left Alternate Level Stairs-Number of Steps: flight Home Access: Stairs to enter Entrance Stairs-Rails: Right, Left, Can reach both Entrance Stairs-Number of Steps: 4 Bathroom  Toilet: Standard Home Care Services: No  Discharge Living Setting Plans for Discharge Living Setting: Patient's home, House, Lives with (comment) (Lives with wife.  [place]ister in [place] Type of Home at Discharge: House Discharge Home Layout: Two level, 1/2 bath on main level, Bed/bath upstairs Alternate Level Stairs-Number of Steps: 10-12 steps Discharge Home Access: Stairs to enter Entrance Stairs-Number of Steps: 2-4 steps Does the patient have any problems obtaining your medications?: No  Social/Family/Support Systems Patient Roles: Spouse (Has a wife, no children.) Contact Information: Ezzard Ditmer - wife Anticipated Caregiver: self and wife Anticipated Caregiver's Contact Information: Blinda Leatherwood - wife - (h) [phone] (c) [phone] Ability/Limitations of Caregiver: Wife works 11 am to 31 pm Caregiver Availability: Intermittent Discharge Plan Discussed with Primary Caregiver: Yes Is Caregiver In Agreement with Plan?: Yes Does Caregiver/Family have Issues with Lodging/Transportation while Pt is in Rehab?: No  Goals/Additional Needs Patient/Family Goal for Rehab: PT/OT mod I goals Expected length of stay: 7 days Cultural Considerations: None Dietary Needs: Carb mod, med cal, thin liquids Equipment Needs: TBD Pt/Family Agrees to Admission and willing to participate: Yes Program Orientation Provided & Reviewed with Pt/Caregiver Including Roles  & Responsibilities: Yes  Decrease burden of Care through IP  rehab admission: N/A  Possible need for SNF placement upon discharge: Not anticipated  Patient Condition: This patient's condition remains as documented in the consult dated 01/19/16, in which the Rehabilitation Physician determined and documented that the patient's condition is appropriate for intensive rehabilitative care in an inpatient rehabilitation facility. Will admit to inpatient rehab today.   Preadmission Screen Completed By:  [name], 01/20/2016 1:22 PM ______________________________________________________________________   Discussed status with Dr. Naaman Plummer on 01/20/16 at 1323 and received telephone approval for admission today.  Admission Coordinator:  [name], time1323/Date12/29/17       Cosigned by:

## 2016-01-24 NOTE — Progress Notes (Signed)
Ranelle Oyster, MD Physician Signed Physical Medicine and Rehabilitation  Consult Note Date of Service: 01/19/2016 11:54 AM  Related encounter: ED to Hosp-Admission (Discharged) from 01/14/2016 in [facility] 5W MEDICAL     Expand All Collapse All   [] Hide copied text [] Hover for attribution information      Physical Medicine and Rehabilitation Consult Reason for Consult: Right BKA Referring Physician: [facility]   HPI: Gordon Walker is a 51 y.o. right handed male with history of diabetes mellitus peripheral neuropathy, hypertension, PVD osteomyelitis right fifth toe partial amputation and right foot 12/30/2015. Per chart review patient lives with wife. Independent prior to admission. Wife works second shift. 2 level home with bedroom upstairs. Very limited family support. Presented 01/15/2016 with ongoing bouts of nausea and dry heaving with associated epigastric pain . Noted creatinine 2.40 with baseline 1.36. WBC 24,800. Found to be in DKA. Placed on broad-spectrum antibiotics. IV insulin therapy. Follow-up orthopedic service Dr. 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 Hospital course pain management. Rapid response 01/18/2016 reported be unresponsive patient did receive Narcan and mental status improved. Subcutaneous Lovenox for DVT prophylaxis. Acute on chronic anemia 8.7 and monitored. Physical and occupational therapy evaluations completed with recommendations of physical medicine rehabilitation consult.   Review of Systems  Constitutional: Negative for chills and fever.  HENT: Negative for hearing loss and tinnitus.   Eyes: Negative for blurred vision and double vision.  Respiratory: Negative for cough and shortness of breath.   Cardiovascular: Positive for leg swelling. Negative for chest pain and palpitations.  Gastrointestinal: Positive for constipation. Negative for  nausea and vomiting.  Genitourinary: Positive for urgency. Negative for dysuria and hematuria.  Musculoskeletal: Positive for myalgias. Negative for joint pain.  Skin: Negative for rash.  Neurological: Positive for weakness. Negative for seizures and loss of consciousness.  All other systems reviewed and are negative.      Past Medical History:  Diagnosis Date  . Diabetes mellitus without complication (HCC)    Type 1  . Heart murmur    "years ago"  . Hypertension    "years ago"  . 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, with long-term current use of insulin [facility])         Past Surgical History:  Procedure Laterality Date  . AMPUTATION Left 01/07/2015   Procedure: Left Foot 1st Ray Amputation;  Surgeon: Nadara Mustard, MD;  Location: [facility] OR;  Service: Orthopedics;  Laterality: Left;  . AMPUTATION Left 07/15/2015   Procedure: Left Transmetatarsal Amputation;  Surgeon: Nadara Mustard, MD;  Location: [facility] OR;  Service: Orthopedics;  Laterality: Left;  . AMPUTATION Right 12/30/2015   Procedure: 5th Ray Amputation Right Foot;  Surgeon: Nadara Mustard, MD;  Location: [facility] OR;  Service: Orthopedics;  Laterality: Right;  . AMPUTATION Right 01/15/2016   Procedure: GUILLOTINE AMPUTATION  ABOVE THE ANKLE AMPUTATION;  Surgeon: Eldred Manges, MD;  Location: [facility] OR;  Service: Orthopedics;  Laterality: Right;  . AMPUTATION Right 01/18/2016   Procedure: AMPUTATION BELOW KNEE;  Surgeon: Eldred Manges, MD;  Location: [facility] OR;  Service: Orthopedics;  Laterality: Right;        Family History  Problem Relation Age of Onset  . Hypertension Mother   . Diabetes Mother   . Cancer Mother   . Prostate cancer Father    Social History:  reports that he has never smoked. He  has never used smokeless tobacco. He reports that he does not drink alcohol or use drugs. Allergies:      Allergies  Allergen Reactions  . No Known Allergies          Medications Prior to  Admission  Medication Sig Dispense Refill  . aspirin EC 81 MG tablet Take 81 mg by mouth daily.    Marland Kitchen atorvastatin (LIPITOR) 20 MG tablet Take 20 mg by mouth daily.    Marland Kitchen glucagon 1 MG injection Inject 1 mg into the muscle once as needed. (Patient taking differently: Inject 1 mg into the muscle daily as needed (hypoglycemia). ) 1 each 12  . HYDROcodone-acetaminophen (NORCO/VICODIN) 5-325 MG tablet Take 1 tablet by mouth every 6 (six) hours as needed for moderate pain.    Marland Kitchen insulin aspart (NOVOLOG FLEXPEN) 100 UNIT/ML FlexPen Inject 9 Units into the skin 3 (three) times daily with meals. And pen needles 3/day 15 mL 11  . insulin glargine (LANTUS) 100 UNIT/ML injection Inject 0.27 mLs (27 Units total) into the skin at bedtime. Sliding scale (Patient taking differently: Inject 0-27 Units into the skin at bedtime. Sliding scale) 10 mL 11  . lisinopril (PRINIVIL,ZESTRIL) 10 MG tablet Take 10 mg by mouth daily.     . Multiple Vitamin (MULTIVITAMIN WITH MINERALS) TABS tablet Take 1 tablet by mouth daily.    . Polyvinyl Alcohol (LUBRICANT DROPS OP) Apply 1 drop to eye daily as needed (dry eyes).      Home: Home Living Family/patient expects to be discharged to:: Skilled nursing facility Living Arrangements: Spouse/significant other Available Help at Discharge: Family, Available PRN/intermittently Type of Home: House Home Access: Stairs to enter [employer] of Steps: 4 Entrance Stairs-Rails: Right, Left, Can reach both Home Layout: Two level Alternate Level Stairs-Number of Steps: flight Alternate Level Stairs-Rails: Left Bathroom Toilet: Standard Home Equipment: Walker - 2 wheels, Other (comment), Crutches (L shoe insert)  Functional History: Prior Function Level of Independence: Independent with assistive device(s) Comments: crutches or RW for mobility, independent with ADL Functional Status:  Mobility: Bed Mobility Overal bed mobility: Needs Assistance Bed  Mobility: Supine to Sit Supine to sit: Min assist General bed mobility comments: Increased time but pt able to manage LEs to EOB. Min hand held assist for trunk elevation to sitting and to scoot hips out to EOB. HOB flat with use of bed rails. Transfers Overall transfer level: Needs assistance Equipment used: Rolling walker (2 wheeled) Transfers: Sit to/from Stand, [employer] Transfers Sit to Stand: Mod assist Stand pivot transfers: Min assist General transfer comment: Mod assist to boost up from EOB and for stand pivot to chair. Pt unsteady on L foot; reports he is typically more steady with L shoe insert that he uses at home. Ambulation/Gait Ambulation/Gait assistance: Min assist Ambulation Distance (Feet): 2 Feet Assistive device: Rolling walker (2 wheeled), 1 person hand held assist Gait Pattern/deviations: Narrow base of support (pivot shift on LLE to chair)    ADL: ADL Overall ADL's : Needs assistance/impaired Eating/Feeding: Independent, Sitting Grooming: Supervision/safety, Sitting Upper Body Bathing: Set up, Supervision/ safety, Sitting Lower Body Bathing: Moderate assistance, Sit to/from stand Upper Body Dressing : Set up, Supervision/safety, Sitting Lower Body Dressing: Moderate assistance, Sit to/from stand Lower Body Dressing Details (indicate cue type and reason): Pt able to adjust sock on L foot sitting EOB with supervision.  Toilet Transfer: Moderate assistance, Stand-pivot, RW Toilet Transfer Details (indicate cue type and reason): Simulated by stand pivot to chair. Mod assist for sit  to stand and min assist for stand pivot. Functional mobility during ADLs: Moderate assistance, Rolling walker (for stand pivot only) General ADL Comments: Pt with increased pain today; declining participation in ADL but agreeable to OOB to chair.  Cognition: Cognition Overall Cognitive Status: Within Functional Limits for tasks assessed Orientation Level: Oriented  X4 Cognition Arousal/Alertness: Awake/alert Behavior During Therapy: WFL for tasks assessed/performed Overall Cognitive Status: Within Functional Limits for tasks assessed  Blood pressure (!) 143/85, pulse 88, temperature 98.5 F (36.9 C), temperature source Oral, resp. rate 17, height 6' (1.829 m), weight 78.2 kg (172 lb 4.8 oz), SpO2 100 %. Physical Exam  Vitals reviewed. Constitutional: He is oriented to person, place, and time. He appears well-developed.  HENT:  Head: Normocephalic.  Eyes: EOM are normal.  Neck: Normal range of motion. Neck supple. No thyromegaly present.  Cardiovascular: Normal rate and regular rhythm.   Respiratory: Effort normal and breath sounds normal. No respiratory distress. He has no wheezes.  GI: Soft. Bowel sounds are normal. He exhibits no distension.  Musculoskeletal:  Right bka dressed. Left TMA intact.  Neurological: He is alert and oriented to person, place, and time. No cranial nerve deficit. Coordination normal.  Follows full commands. UE 5/5. RLE limited by pain. LLE grossly 4-5/5. Senses pain left foot  Skin:  BKA site is dressed appropriately tender. Callus left lateral foot without breakdown    Lab Results Last 24 Hours        Results for orders placed or performed during the hospital encounter of 01/14/16 (from the past 24 hour(s))  Hepatitis panel, acute     Status: None   Collection Time: 01/18/16  3:02 PM  Result Value Ref Range   Hepatitis B Surface Ag Negative Negative   HCV Ab <0.1 0.0 - 0.9 s/co ratio   Hep A IgM Negative Negative   Hep B C IgM Negative Negative  Type and screen  [facility]     Status: None   Collection Time: 01/18/16  3:04 PM  Result Value Ref Range   ISSUE DATE / TIME [id number]    Blood Product Unit Number [id number]    PRODUCT CODE [id number]    Unit Type and Rh 5100    Blood Product Expiration Date [id number]   ABO/Rh     Status: None   Collection Time:  01/18/16  3:04 PM  Result Value Ref Range   ABO/RH(D) O POS   Prepare RBC     Status: None   Collection Time: 01/18/16  3:15 PM  Result Value Ref Range   Order Confirmation      BB SAMPLE OR UNITS ALREADY AVAILABLE ONLY 1 UNIT NEEDED PER RN  Prepare RBC     Status: None   Collection Time: 01/18/16  3:22 PM  Result Value Ref Range   Order Confirmation ORDER PROCESSED BY BLOOD BANK   Glucose, capillary     Status: None   Collection Time: 01/18/16  5:08 PM  Result Value Ref Range   Glucose-Capillary 80 65 - 99 mg/dL  Glucose, capillary     Status: None   Collection Time: 01/18/16  7:22 PM  Result Value Ref Range   Glucose-Capillary 92 65 - 99 mg/dL  Glucose, capillary     Status: Abnormal   Collection Time: 01/18/16  8:29 PM  Result Value Ref Range   Glucose-Capillary 144 (H) 65 - 99 mg/dL  Comprehensive metabolic panel     Status: Abnormal   Collection Time: 01/19/16  5:15 AM  Result Value Ref Range   Sodium 134 (L) 135 - 145 mmol/L   Potassium 3.5 3.5 - 5.1 mmol/L   Chloride 102 101 - 111 mmol/L   CO2 29 22 - 32 mmol/L   Glucose, Bld 103 (H) 65 - 99 mg/dL   BUN 12 6 - 20 mg/dL   Creatinine, Ser 9.60 0.61 - 1.24 mg/dL   Calcium 7.6 (L) 8.9 - 10.3 mg/dL   Total Protein 5.4 (L) 6.5 - 8.1 g/dL   Albumin 1.5 (L) 3.5 - 5.0 g/dL   AST 454 (H) 15 - 41 U/L   ALT 134 (H) 17 - 63 U/L   Alkaline Phosphatase 346 (H) 38 - 126 U/L   Total Bilirubin 1.2 0.3 - 1.2 mg/dL   GFR calc non Af Amer >60 >60 mL/min   GFR calc Af Amer >60 >60 mL/min   Anion gap 3 (L) 5 - 15  CBC     Status: Abnormal   Collection Time: 01/19/16  5:15 AM  Result Value Ref Range   WBC 20.8 (H) 4.0 - 10.5 K/uL   RBC 3.13 (L) 4.22 - 5.81 MIL/uL   Hemoglobin 8.7 (L) 13.0 - 17.0 g/dL   HCT 09.8 (L) 11.9 - 14.7 %   MCV 83.1 78.0 - 100.0 fL   MCH 27.8 26.0 - 34.0 pg   MCHC 33.5 30.0 - 36.0 g/dL   RDW 82.9 56.2 - 13.0 %   Platelets 305 150 - 400 K/uL  Glucose, capillary      Status: Abnormal   Collection Time: 01/19/16  9:00 AM  Result Value Ref Range   Glucose-Capillary 170 (H) 65 - 99 mg/dL     Imaging Results (Last 48 hours)  No results found.    Assessment/Plan: Diagnosis: Right BKA pod #1, hx of left TMA 1. Does the need for close, 24 hr/day medical supervision in concert with the patient's rehab needs make it unreasonable for this patient to be served in a less intensive setting? Yes 2. Co-Morbidities requiring supervision/potential complications: dm1, chronic wound issues, pain 3. Due to bladder management, bowel management, safety, skin/wound care, disease management, medication administration, pain management and patient education, does the patient require 24 hr/day rehab nursing? Yes 4. Does the patient require coordinated care of a physician, rehab nurse, PT (1-2 hrs/day, 5 days/week) and OT (1-2 hrs/day, 5 days/week) to address physical and functional deficits in the context of the above medical diagnosis(es)? Yes Addressing deficits in the following areas: balance, endurance, locomotion, strength, transferring, bowel/bladder control, bathing, dressing, feeding, grooming, toileting and psychosocial support 5. Can the patient actively participate in an intensive therapy program of at least 3 hrs of therapy per day at least 5 days per week? Yes 6. The potential for patient to make measurable gains while on inpatient rehab is excellent 7. Anticipated functional outcomes upon discharge from inpatient rehab are modified independent  with PT, modified independent with OT, n/a with SLP. 8. Estimated rehab length of stay to reach the above functional goals is: 7 days 9. Does the patient have adequate social supports and living environment to accommodate these discharge functional goals? Yes 10. Anticipated D/C setting: Home 11. Anticipated post D/C treatments: HH therapy 12. Overall Rehab/Functional Prognosis: excellent  RECOMMENDATIONS: This  patient's condition is appropriate for continued rehabilitative care in the following setting: CIR Patient has agreed to participate in recommended program. Yes Note that insurance prior authorization may be required for reimbursement for recommended care.  Comment: Pt  active and independent prior to hospitalization. Rehab Admissions Coordinator to follow up.  Thanks,  Ranelle Oyster, MD, [place]    Charlton Amor., PA-C 01/19/2016

## 2016-01-24 NOTE — Significant Event (Signed)
Hypoglycemic Event  CBG: 62   Treatment: 15 GM carbohydrate snack  Symptoms: None  Follow-up CBG: Time:1155 CBG Result:56  Possible Reasons for Event: Change in activity  Comments/MD notified:Dan Angiulli, PA-C notified. Offered graham crackers, peanut butter, and apple juice. Will recheck in 15 mins.    Marlyne BeardsJENNINGS, Suzi RootsSTACEY G

## 2016-01-24 NOTE — Plan of Care (Signed)
Problem: RH Ambulation Goal: LTG Patient will ambulate in controlled environment (PT) LTG: Patient will ambulate in a controlled environment, # of feet with assistance (PT).  upgraded d/t progress, decr family support Goal: LTG Patient will ambulate in home environment (PT) LTG: Patient will ambulate in home environment, # of feet with assistance (PT).  upgraded d/t progress, decr family support  Problem: RH Stairs Goal: LTG Patient will ambulate up and down stairs w/assist (PT) LTG: Patient will ambulate up and down # of stairs with assistance (PT)  upgraded d/t progress, decr family support

## 2016-01-24 NOTE — Progress Notes (Signed)
Inpatient Diabetes Program Recommendations  AACE/ADA: New Consensus Statement on Inpatient Glycemic Control (2015)  Target Ranges:  Prepandial:   less than 140 mg/dL      Peak postprandial:   less than 180 mg/dL (1-2 hours)      Critically ill patients:  140 - 180 mg/dL   Lab Results  Component Value Date   GLUCAP 70 01/24/2016   HGBA1C 8.2 12/28/2015    Review of Glycemic Control Results for Gordon CousinRANDALL, [name] (MRN [id number]) as of 01/24/2016 13:50  Ref. Range 01/23/2016 21:16 01/24/2016 06:32 01/24/2016 11:36 01/24/2016 11:55 01/24/2016 12:14  Glucose-Capillary Latest Ref Range: 65 - 99 mg/dL 72 82 62 (L) 56 (L) 70   Inpatient Diabetes Program Recommendations:  Based on current CBGs, please consider: -Decrease Lantus to 25 units -Decrease Novolog correction to sensitive 0-9 tid with meals -Decrease Novolog meal coverage to 2-3 units tid if eats 50%  Thank you, Darel HongJudy E. Kinnedy Mongiello, RN, MSN, CDE Inpatient Glycemic Control Team Team Pager [phone] (8am-5pm) 01/24/2016 1:51 PM

## 2016-01-24 NOTE — Progress Notes (Signed)
Social Work  Social Work Assessment and Plan  Patient Details  Name: Gordon Walker MRN: [id number] Date of Birth: May 23, 1965  Today's Date: 01/24/2016  Problem List:  Patient Active Problem List   Diagnosis Date Noted  . Acute blood loss anemia 01/20/2016  . Amputation of right lower extremity below knee (HCC) 01/20/2016  . Hyponatremia 01/17/2016  . AKI (acute kidney injury) (HCC) 01/17/2016  . Hyperbilirubinemia 01/17/2016  . Bacteremia due to Escherichia coli 01/17/2016  . Sepsis (HCC) 01/17/2016  . DKA (diabetic ketoacidoses) (HCC) 01/15/2016  . Gas gangrene of foot (HCC)   . Foot amputation status, right (HCC) 01/05/2016  . Ulcer of toe of right foot, with necrosis of bone (HCC) 12/27/2015  . Status post transmetatarsal amputation of foot, left (HCC) 11/24/2015  . Controlled diabetes mellitus with diabetic peripheral angiopathy and gangrene (HCC)   . 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 Transaminitis 01/07/2015  . Osteomyelitis of left foot (HCC) 01/06/2015  . Uncontrolled diabetes mellitus with foot ulcer, with long-term current use of insulin (HCC) 01/06/2015  . Anemia 01/06/2015  . Osteomyelitis (HCC) 01/05/2015   Past Medical History:  Past Medical History:  Diagnosis Date  . Diabetes mellitus without complication (HCC)    Type 1  . Heart murmur    "years ago"  . Hypertension    "years ago"  . 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, with long-term current use of insulin (HCC)    Past Surgical History:  Past Surgical History:  Procedure Laterality Date  . AMPUTATION Left 01/07/2015   Procedure: Left Foot 1st Ray Amputation;  Surgeon: Nadara Mustard, MD;  Location[facility] OR;  Service: Orthopedics;  Laterality: Left;  . AMPUTATION Left 07/15/2015   Procedure: Left Transmetatarsal Amputation;  Surgeon: Nadara Mustard, MD;  Location: [facility] OR;  Service:  Orthopedics;  Laterality: Left;  . AMPUTATION Right 12/30/2015   Procedure: 5th Ray Amputation Right Foot;  Surgeon: Nadara Mustard, MD;  Location: [facility] OR;  Service: Orthopedics;  Laterality: Right;  . AMPUTATION Right 01/15/2016   Procedure: GUILLOTINE AMPUTATION  ABOVE THE ANKLE AMPUTATION;  Surgeon: Eldred Manges, MD;  Location: [facility] OR;  Service: Orthopedics;  Laterality: Right;  . AMPUTATION Right 01/18/2016   Procedure: AMPUTATION BELOW KNEE;  Surgeon: Eldred Manges, MD;  Location: [facility] OR;  Service: Orthopedics;  Laterality: Right;   Social History:  reports that he has never smoked. He has never used smokeless tobacco. He reports that he does not drink alcohol or use drugs.  Family / Support Systems Marital Status: Married How Long?: since 2010 Patient Roles: Spouse Spouse/Significant Other: wife, Gordon Walker @ (H) [phone] or [phone] Children: none Other Supports: pt has brother-in-law and mother-in-law local  Anticipated Caregiver: self and wife Ability/Limitations of Caregiver: Wife works 11 am to 930 pm Caregiver Availability: Intermittent Family Dynamics: Pt reports that he does not want to ask pt's family for assistance and denies any concerns about reaching independent level by d/c.  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 Legal Hisotry/Current Legal Issues: None Guardian/Conservator: None  - per MD, pt is capable of making decisions on his own behalf.   Abuse/Neglect  Physical Abuse: Denies Verbal Abuse: Denies Sexual Abuse: Denies Exploitation of patient/patient's resources: Denies Self-Neglect: Denies  Emotional Status Pt's affect, behavior adn adjustment status: Pt with flat affect and limited eye  contact.  He quickly dismisses any concerns about managing independently at home and return to work.  He denies any emotional distress, however, does express frustration over what he feels was an initially surgery (toe amp) that "went bad."  Will monitor mood and refer for neuropsychology support as indicated. Recent Psychosocial Issues: prior amputations this year Pyschiatric History: None Substance Abuse History: None  Patient / Family Perceptions, Expectations & Goals Pt/Family understanding of illness & functional limitations: Pt and wife with good understanding of reasons that further surgery/ BKA was needed. Good understanding of his current functional limitations/ need for CIR. Premorbid pt/family roles/activities: Pt was independent with rw just PTA.  Prior to toe amp he was working f/t. Anticipated changes in roles/activities/participation: little change anticipated if able to reach mod ind goals. Pt/family expectations/goals: "I'll be ok.  Just need to get stronger."  [employer] Community Agencies: None Premorbid Home Care/DME Agencies: None Transportation available at discharge: yes Resource referrals recommended: Neuropsychology, Support group (specify)  Discharge Planning Living Arrangements: Spouse/significant other Support Systems: Spouse/significant other, Other relatives Type of Residence: Private residence Insurance Resources: Media plannerrivate Insurance (specify) (BCBS of [place]) Financial Resources: Employment Surveyor, quantityinancial Screen Referred: No Living Expenses: Database administratorMotgage Money Management: Patient Does the patient have any problems obtaining your medications?: No Home Management: pt and wife share responsibilities Patient/Family Preliminary Plans: Pt to return home with wife working 2nd shift at [employer] Social Work Anticipated Follow Up Needs: HH/OP Expected length of stay: 14 days  Clinical Impression A&O gentleman here following BKA and with very flat affect.  Completes  assessment interview without difficulty, however offers no additional information other than brief answers.  He denies any concerns about d/c or any emotional difficuties.  Anticipating short LOS.  Will follow for d/c planning needs.  Breyonna Nault 01/24/2016, 1:35 PM

## 2016-01-24 NOTE — Plan of Care (Signed)
Problem: RH BOWEL ELIMINATION Goal: RH STG MANAGE BOWEL W/MEDICATION W/ASSISTANCE STG Manage Bowel with Medication with [place].   Outcome: Not Applicable Date Met: 37/95/58 Pt not using meds at this time for bowel management

## 2016-01-24 NOTE — Progress Notes (Signed)
Physical Therapy Session Note  Patient Details  Name: Regis Hinton MRN: [id number] Date of Birth: 05/20/1965  Today's Date: 01/24/2016 PT Individual Time: [phone] and [id number] PT Individual Time Calculation (min): 60 min and 60 min (total 120 min)    Short Term Goals: Week 1:  PT Short Term Goal 1 (Week 1): Pt will ambulate 8' with RW and min assist PT Short Term Goal 2 (Week 1): Pt will initiate stair training with PT PT Short Term Goal 3 (Week 1): Pt will demo dynamic standing balance with min assist  Skilled Therapeutic Interventions/Progress Updates:   Tx 1: Pt received seated in bed eating breakfast; requests to complete breakfast before participation in session. Upon therapist's return pt reports pain as below and agreeable to treatment. While pt finishing eating, discussed home setup and challenge of ascending stairs safely with only one rail; pt reports he is not interested in learning how to bump up stairs in sitting and states he can brace himself with LUE on wall while RUE on rail. Transfer bed>w/c with S squat pivot. W/c propulsion to gym modI with slow speed. Performed gait x60' with RW and close S. Ascent/descent stairs 3" and 6" height with B handrails and min guard overall, minA for first 6" stair due to catching L toe on step. Discussed with pt again who states he now agrees that a second rail would be helpful and encouraged pt to begin discussing with his wife and family/friends to see if he can get one installed. Gait within ADL apartment on carpeted surface to various sitting locations (bed, couch, chair, toilet) simulate home environment, S overall and occasional rest breaks due to fatigue. Squat pivot transfer w/c <>nustep with S and min cues for removing RLE amputee pad to allow for closer placement of w/c to nustep. Performed nustep x12 min with BUE/LLE for strengthening and aerobic endurance. Educated pt throughout session on LLE foot skin integrity, discussed RLE limb  wrapping, home setup with open walkways, and energy conservation strategies. Ambulated on ramp and uneven surface with RW and close S. Returned to room w/c propulsion modI. Remained seated in w/c with all needs in reach at end of session.   Tx 2: Pt received supine in bed, denies pain and agreeable to treatment. Transfer bed>w/c with modI squat pivot. W/c propulsion to/from gym with BUE and modI x175'. Squat pivot transfer to mat with min cues for w/c setup to correctly remove leg rests. Supine/sidelying/prone LE strengthening exercises with 1-1.5# ankle cuff weights including straight leg raise, bridging, sidelying hip abduction, crunches, hip extension, sitting long arc quad. Pt does not tolerate prone lying for long duration; educated in importance of maintaining hip ROM to prepare for ambulation with prosthesis. Returned to room with w/c propulsion modI. Discussed stair rail again with pt and encouraged pt to begin discussion/planning today in order to have rail installed close to when he will d/c home; pt agreeable. Remained seated in w/c at end of session, all needs in reach.   Therapy Documentation Precautions:  Precautions Precautions: Fall Required Braces or Orthoses: Knee Immobilizer - Right Restrictions Weight Bearing Restrictions: Yes RLE Weight Bearing: Non weight bearing General: PT Amount of Missed Time (min): 15 Minutes PT Missed Treatment Reason: Unavailable (Comment) (breakfast) Pain: Pain Assessment Pain Assessment: 0-10 Pain Score: 2  Pain Type: Acute pain;Surgical pain Pain Location: Leg Pain Orientation: Right Pain Descriptors / Indicators: Aching Pain Frequency: Intermittent Pain Onset: On-going Patients Stated Pain Goal: 0 Pain Intervention(s): Medication (See eMAR) Multiple  Pain Sites: No   See Function Navigator for Current Functional Status.   Therapy/Group: Individual Therapy  Vista Lawmanlizabeth J Tygielski 01/24/2016, 9:17 AM

## 2016-01-24 NOTE — Progress Notes (Signed)
Patient information reviewed and entered into eRehab system by Davonne Baby, RN, CRRN, PPS Coordinator.  Information including medical coding and functional independence measure will be reviewed and updated through discharge.    

## 2016-01-24 NOTE — Care Management Note (Signed)
[facility] Individual Statement of Services  Patient Name:  Gordon Walker  Date:  01/24/2016  Welcome to the [facility].  Our goal is to provide you with an individualized program based on your diagnosis and situation, designed to meet your specific needs.  With this comprehensive rehabilitation program, you will be expected to participate in at least 3 hours of rehabilitation therapies Monday-Friday, with modified therapy programming on the weekends.  Your rehabilitation program will include the following services:  Physical Therapy (PT), Occupational Therapy (OT), 24 hour per day rehabilitation nursing, Therapeutic Recreaction (TR), Case Management (Social Worker), Rehabilitation Medicine, Nutrition Services and Pharmacy Services  Weekly team conferences will be held on Tuesdays to discuss your progress.  Your Social Worker will talk with you frequently to get your input and to update you on team discussions.  Team conferences with you and your family in attendance may also be held.  Expected length of stay: 7 days  Overall anticipated outcome: modified independent  Depending on your progress and recovery, your program may change. Your Social Worker will coordinate services and will keep you informed of any changes. Your Social Worker's name and contact numbers are listed  below.  The following services may also be recommended but are not provided by the [facility]:   Driving Evaluations  Home Health Rehabiltiation Services  Outpatient Rehabilitation Services  Vocational Rehabilitation   Arrangements will be made to provide these services after discharge if needed.  Arrangements include referral to agencies that provide these services.  Your insurance has been verified to be:  [employer] [place] Your primary doctor is:  [place]  Pertinent information will be shared with your doctor and your insurance company.  Social Worker:   [place] Rita[place]cy Jemmie Rhinehart, [place] [phone] or ([phone]) [phone]   Information discussed with and copy given to patient by: Amada JupiterHOYLE, [name], 01/24/2016, 4:24 PM

## 2016-01-24 NOTE — Significant Event (Signed)
Hypoglycemic Event  CBG: 56  Treatment: 15 GM carbohydrate snack  Symptoms: Hungry  Follow-up CBG: Time:1214 CBG Result:70  Possible Reasons for Event: Change in activity  Comments/MD notified:Dan Angiulli, PA-C aware of status. Patient offered additional Sprite. Lunch trays on unit.    Marlyne BeardsJENNINGS, Suzi RootsSTACEY G

## 2016-01-24 NOTE — Significant Event (Signed)
Hypoglycemic Event  CBG: 92  Treatment: 15 GM carbohydrate snack  Symptoms: None  Follow-up CBG: Time:1823 CBG Result:92  Possible Reasons for Event: Unknown  Comments/MD notified: Deatra Inaan Angiulli, PA-C aware of status. Dinner tray arrived. Urged patient to have snack at HS.    Marlyne BeardsJENNINGS, Suzi RootsSTACEY G

## 2016-01-24 NOTE — Progress Notes (Signed)
[place] PHYSICAL MEDICINE & REHABILITATION     PROGRESS NOTE    Subjective/Complaints: No complaints. "everything's good"--very stoic  ROS: pt denies nausea, vomiting, diarrhea, cough, shortness of breath or chest pain  Objective: Vital Signs: Blood pressure (!) 158/88, pulse 82, temperature 98.3 F (36.8 C), temperature source Oral, resp. rate 18, height 6' (1.829 m), weight 84 kg (185 lb 3 oz), SpO2 100 %. No results found.  Recent Labs  01/22/16 0747  WBC 16.4*  HGB 11.4*  HCT 34.1*  PLT 504*    Recent Labs  01/22/16 0747  NA 133*  K 3.9  CL 98*  GLUCOSE 83  BUN 7  CREATININE 0.81  CALCIUM 8.3*   CBG (last 3)   Recent Labs  01/23/16 1632 01/23/16 2116 01/24/16 0632  GLUCAP 119* 72 82    Wt Readings from Last 3 Encounters:  01/24/16 84 kg (185 lb 3 oz)  01/20/16 78.7 kg (173 lb 9.6 oz)  12/30/15 84.4 kg (186 lb)    Physical Exam:  Constitutional: He appears well-developed. No distress HENT:  Head: Normocephalic.  Eyes: EOM are normal.  Neck: Normal range of motion. Neck supple. No tracheal deviation present. No thyromegaly present.  Cardiovascular: RRR.Marland Kitchen  Respiratory:  Clear bilaterally. No distress  GI: Soft. Bowel sounds are normal. He exhibits no distension. There is no tenderness.  Musculoskeletal:  Right bka with sl serosang drainage. Left TMA intact.  Neurological: He is alert and oriented to person, place, and time. No cranial nerve deficit. Coordination normal.  Follows full commands. Bilateral UE 5/5. RLE: HF 3-4/5. KE limited by pain. LLE grossly 4-5/5. Senses pain and gross touch in left foot/leg  Skin: Callus left lateral foot without breakdown. Right BK incision clean with minimal s/s drainage centrally. Well approximated.  Psych: very flat but cooperative   Assessment/Plan: 1. Functional deficits secondary to right BKA which require 3+ hours per day of interdisciplinary therapy in a comprehensive inpatient rehab  setting. Physiatrist is providing close team supervision and 24 hour management of active medical problems listed below. Physiatrist and rehab team continue to assess barriers to discharge/monitor patient progress toward functional and medical goals.  Function:  Bathing Bathing position   Position: Shower  Bathing parts Body parts bathed by patient: Right arm, Left arm, Chest, Abdomen, Front perineal area, Buttocks, Right upper leg, Left upper leg, Left lower leg Body parts bathed by helper: Back, Buttocks  Bathing assist Assist Level: Touching or steadying assistance(Pt > 75%)      Upper Body Dressing/Undressing Upper body dressing   What is the patient wearing?: Pull over shirt/dress     Pull over shirt/dress - Perfomed by patient: Thread/unthread right sleeve, Thread/unthread left sleeve, Put head through opening, Pull shirt over trunk          Upper body assist Assist Level: Set up   Set up : To obtain clothing/put away  Lower Body Dressing/Undressing Lower body dressing   What is the patient wearing?: Underwear, Pants, Non-skid slipper socks Underwear - Performed by patient: Thread/unthread right underwear leg, Thread/unthread left underwear leg Underwear - Performed by helper: Pull underwear up/down Pants- Performed by patient: Thread/unthread right pants leg, Thread/unthread left pants leg Pants- Performed by helper: Pull pants up/down     Socks - Performed by patient: Don/doff left sock   Shoes - Performed by patient: Don/doff left shoe            Lower body assist Assist for lower body dressing: Touching or  steadying assistance (Pt > 75%)      Toileting Toileting Toileting activity did not occur: No continent bowel/bladder event Toileting steps completed by patient: Adjust clothing prior to toileting, Adjust clothing after toileting   Toileting Assistive Devices: Grab bar or rail  Toileting assist Assist level: Set up/obtain supplies    Transfers Chair/bed transfer   Chair/bed transfer method: Stand pivot Chair/bed transfer assist level: Supervision or verbal cues Chair/bed transfer assistive device: Bedrails, Armrests     Locomotion Ambulation     Max distance: 30 Assist level: Touching or steadying assistance (Pt > 75%)   Wheelchair   Type: Manual Max wheelchair distance: 150 Assist Level: Supervision or verbal cues  Cognition Comprehension Comprehension assist level: Understands complex 90% of the time/cues 10% of the time  Expression Expression assist level: Expresses complex 90% of the time/cues < 10% of the time  Social Interaction Social Interaction assist level: Interacts appropriately 75 - 89% of the time - Needs redirection for appropriate language or to initiate interaction.  Problem Solving Problem solving assist level: Solves basic problems with no assist  Memory Memory assist level: Recognizes or recalls 90% of the time/requires cueing < 10% of the time   Medical Problem List and Plan:  1. 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  -continue therapies. Team conference today 2. DVT Prophylaxis/Anticoagulation: Subcutaneous Lovenox. Monitor platelet counts and any signs of bleeding  3. Pain Management: Hydrocodone as needed with control 4. Acute blood loss anemia. hgb up to 11  -no signs of substantial blood loss.  5. Neuropsych: This patient is capable of making decisions on his own behalf.   -team to provide ego support as needed---very flat 6. Skin/Wound Care: xeroform, gauze, ACE daily can continue 7. Fluids/Electrolytes/Nutrition: pt with good po intake 8. Diabetes mellitus with peripheral neuropathy. Hemoglobin A1c 8.2. NovoLog 4 units 3 times a day with meals, Lantus insulin 30 units daily at bedtime.   -reasonable control. No changes 9. Constipation. Laxative assistance  10. ID: pt continues on Ancef  --through 01/27/16   LOS (Days) 4 A FACE TO FACE EVALUATION WAS PERFORMED  Ranelle OysterSWARTZ,ZACHARY T, MD 01/24/2016 8:31 AM

## 2016-01-24 NOTE — Progress Notes (Signed)
Occupational Therapy Session Note  Patient Details  Name: Gordon Walker MRN: [id number] Date of Birth: Jul 02, 1965  Today's Date: 01/24/2016 OT Individual Time: 1000-1100 OT Individual Time Calculation (min): 60 min     Short Term Goals:Week 1:  OT Short Term Goal 1 (Week 1): LTG=STG 2/2 esitmated short LOS  Skilled Therapeutic Interventions/Progress Updates:    Pt seen for OT ADL bathing/dressing session. Pt sitting up in w/c upon arrival, agreeable to tx session and bathing at shower level. He gathered clothing items from w/c level, VCs and min A required as chair began to flip forward as pt attempting to reach forward to gather clothing items. Throughout session, squat pivot transfers completed with supervision. He bathed seated on tub bench with distant supervision.  He dressed seated in w/c, standing at bed side to pull pants up. Grooming completed standing at sink addressing functional standing balance and endurance. He required 1 UE support and slight lean into sink ledge for balance. In ADL apartment, discussed DME options for showering. Pt with both tub/shower combination and walk in shower at home. Declined attempting tub transfer bench due to cost of DME. Completed simulated shower stall tranfser to shower chair and then to drop arm BSC. Both completed with supervision. Will order drop arm BSC for showering use as it is covered by insurance and pt with no preference. Educated regarding importance of clearing surfaces when transferring, especially in shower for skin protection. Pt returned to room at end of session, left seated in w/c with all needs in reach.   Therapy Documentation Precautions:  Precautions Precautions: Fall Required Braces or Orthoses: Knee Immobilizer - Right Restrictions Weight Bearing Restrictions: Yes RLE Weight Bearing: Non weight bearing Pain:   No/ denies pain ADL: ADL ADL Comments: See functional navigator  See Function Navigator for Current  Functional Status.   Therapy/Group: Individual Therapy  Lewis, Dori Devino C 01/24/2016, 7:14 AM

## 2016-01-25 ENCOUNTER — Inpatient Hospital Stay (HOSPITAL_COMMUNITY): Payer: BLUE CROSS/BLUE SHIELD | Admitting: Occupational Therapy

## 2016-01-25 ENCOUNTER — Inpatient Hospital Stay (HOSPITAL_COMMUNITY): Payer: BLUE CROSS/BLUE SHIELD | Admitting: Physical Therapy

## 2016-01-25 LAB — GLUCOSE, CAPILLARY
GLUCOSE-CAPILLARY: 124 mg/dL — AB (ref 65–99)
GLUCOSE-CAPILLARY: 81 mg/dL (ref 65–99)
Glucose-Capillary: 51 mg/dL — ABNORMAL LOW (ref 65–99)
Glucose-Capillary: 88 mg/dL (ref 65–99)
Glucose-Capillary: 157 mg/dL — ABNORMAL HIGH (ref 65–99)

## 2016-01-25 MED ORDER — INSULIN ASPART 100 UNIT/ML ~~LOC~~ SOLN
4.0000 [IU] | Freq: Three times a day (TID) | SUBCUTANEOUS | Status: DC
  Administered 2016-01-26 – 2016-01-28 (×7): 4 [IU] via SUBCUTANEOUS

## 2016-01-25 MED ORDER — INSULIN GLARGINE 100 UNIT/ML ~~LOC~~ SOLN
10.0000 [IU] | Freq: Every day | SUBCUTANEOUS | Status: DC
  Administered 2016-01-25: 10 [IU] via SUBCUTANEOUS
  Filled 2016-01-25: qty 0.1

## 2016-01-25 NOTE — Progress Notes (Signed)
[place] PHYSICAL MEDICINE & REHABILITATION     PROGRESS NOTE    Subjective/Complaints: Sugars quite low yesterday. Ate fairly well. Participated in therapy  ROS: pt denies nausea, vomiting, diarrhea, cough, shortness of breath or chest pain   Objective: Vital Signs: Blood pressure 133/76, pulse 83, temperature 98.5 F (36.9 C), temperature source Oral, resp. rate 18, height 6' (1.829 m), weight 80.6 kg (177 lb 9.6 oz), SpO2 98 %. No results found. No results for input(s): WBC, HGB, HCT, PLT in the last 72 hours. No results for input(s): NA, K, CL, GLUCOSE, BUN, CREATININE, CALCIUM in the last 72 hours.  Invalid input(s): CO CBG (last 3)   Recent Labs  01/24/16 2054 01/24/16 2210 01/25/16 0747  GLUCAP 77 138* 124*    Wt Readings from Last 3 Encounters:  01/25/16 80.6 kg (177 lb 9.6 oz)  01/20/16 78.7 kg (173 lb 9.6 oz)  12/30/15 84.4 kg (186 lb)    Physical Exam:  Constitutional: He appears well-developed. No distress HENT:  Head: Normocephalic.  Eyes: EOM are normal.  Neck: Normal range of motion. Neck supple. No tracheal deviation present. No thyromegaly present.  Cardiovascular: RRR.  Respiratory:  Clear bilaterally. No distress  GI: Soft. Bowel sounds are normal. He exhibits no distension. There is no tenderness.  Musculoskeletal:  Right bka with edema. Left TMA intact.  Neurological: He is alert and oriented to person, place, and time. No cranial nerve deficit. Coordination normal.  Follows full commands. Bilateral UE 5/5. RLE: HF 3-4/5. KE limited by pain. LLE grossly 4-5/5. Senses pain and gross touch in left foot/leg  Skin: Callus left lateral foot without breakdown. Right BK incision clean with minimal s/s drainage centrally. Well approximated.  Psych: very flat but cooperative   Assessment/Plan: 1. Functional deficits secondary to right BKA which require 3+ hours per day of interdisciplinary therapy in a comprehensive inpatient rehab  setting. Physiatrist is providing close team supervision and 24 hour management of active medical problems listed below. Physiatrist and rehab team continue to assess barriers to discharge/monitor patient progress toward functional and medical goals.  Function:  Bathing Bathing position   Position: Shower  Bathing parts Body parts bathed by patient: Right arm, Left arm, Chest, Abdomen, Front perineal area, Buttocks, Right upper leg, Left upper leg, Left lower leg, Back Body parts bathed by helper: Back, Buttocks  Bathing assist Assist Level: More than reasonable time      Upper Body Dressing/Undressing Upper body dressing   What is the patient wearing?: Pull over shirt/dress     Pull over shirt/dress - Perfomed by patient: Thread/unthread right sleeve, Thread/unthread left sleeve, Put head through opening, Pull shirt over trunk          Upper body assist Assist Level: No help, No cues   Set up : To obtain clothing/put away  Lower Body Dressing/Undressing Lower body dressing   What is the patient wearing?: Underwear, Pants, Non-skid slipper socks Underwear - Performed by patient: Thread/unthread right underwear leg, Thread/unthread left underwear leg, Pull underwear up/down Underwear - Performed by helper: Pull underwear up/down Pants- Performed by patient: Thread/unthread right pants leg, Thread/unthread left pants leg, Pull pants up/down Pants- Performed by helper: Pull pants up/down Non-skid slipper socks- Performed by patient: Don/doff left sock (R N/A)   Socks - Performed by patient: Don/doff left sock   Shoes - Performed by patient: Don/doff left shoe            Lower body assist Assist for lower body  dressing: Supervision or verbal cues      Toileting Toileting Toileting activity did not occur: No continent bowel/bladder event Toileting steps completed by patient: Adjust clothing prior to toileting, Adjust clothing after toileting, Performs perineal hygiene    Toileting Assistive Devices: Grab bar or rail  Toileting assist Assist level: Supervision or verbal cues   Transfers Chair/bed transfer   Chair/bed transfer method: Squat pivot Chair/bed transfer assist level: Supervision or verbal cues Chair/bed transfer assistive device: Bedrails, Armrests     Locomotion Ambulation     Max distance: 60 Assist level: Supervision or verbal cues   Wheelchair   Type: Manual Max wheelchair distance: 150 Assist Level: No help, No cues, assistive device, takes more than reasonable amount of time  Cognition Comprehension Comprehension assist level: Understands complex 90% of the time/cues 10% of the time  Expression Expression assist level: Expresses complex 90% of the time/cues < 10% of the time  Social Interaction Social Interaction assist level: Interacts appropriately 75 - 89% of the time - Needs redirection for appropriate language or to initiate interaction.  Problem Solving Problem solving assist level: Solves basic problems with no assist  Memory Memory assist level: Recognizes or recalls 90% of the time/requires cueing < 10% of the time   Medical Problem List and Plan:  1. 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  -continue therapies. ELOS 1/6 2. DVT Prophylaxis/Anticoagulation: Subcutaneous Lovenox. Monitor platelet counts and any signs of bleeding  3. Pain Management: Hydrocodone as needed with control 4. Acute blood loss anemia. hgb up to 11  -no signs of substantial blood loss.  5. Neuropsych: This patient is capable of making decisions on his own behalf.   -team to provide ego support as needed---remains very flat 6. Skin/Wound Care: xeroform, gauze, ACE daily can continue 7. Fluids/Electrolytes/Nutrition: pt with good po intake 8. Diabetes mellitus with peripheral neuropathy. Hemoglobin A1c 8.2. NovoLog 4 units 3 times a day with meals, Lantus  insulin 30 units daily.   -will convert him back to bedtime admin (receiving in AM here) 9. Constipation. Laxative assistance  10. ID: pt continues on Ancef --through 01/27/16   LOS (Days) 5 A FACE TO FACE EVALUATION WAS PERFORMED  Ranelle OysterSWARTZ,ZACHARY T, MD 01/25/2016 8:35 AM

## 2016-01-25 NOTE — Significant Event (Signed)
Hypoglycemic Event  CBG: 51  Treatment: 15 GM carbohydrate snack  Symptoms: None  Follow-up CBG: Time:1308 CBG Result:88  Possible Reasons for Event: Change in activity  Comments/MD notified: Deatra Inaan Angiulli, PA-C aware of status. Patient given snack and lunch tray. Patient informed to relay to staff when he needs an additional snack.    Marlyne BeardsJENNINGS, Suzi RootsSTACEY G

## 2016-01-25 NOTE — Progress Notes (Signed)
Physical Therapy Session Note  Patient Details  Name: Gordon CousinFrederick Duffin MRN: [id number] Date of Birth: [birth year]  Today's Date: 01/25/2016 PT Individual Time: [id number] and 1415-1455 PT Individual Time Calculation (min): 85 min and 40 min (total 125 min)    Short Term Goals: Week 1:  PT Short Term Goal 1 (Week 1): Pt will ambulate 7825' with RW and min assist PT Short Term Goal 2 (Week 1): Pt will initiate stair training with PT PT Short Term Goal 3 (Week 1): Pt will demo dynamic standing balance with min assist  Skilled Therapeutic Interventions/Progress Updates:   Tx 1: Pt received seated in w/c, denies pain and agreeable to treatment. Discussed with pt goal of walking to gym with RW and wc available for rest breaks as needed; ambulated whole distance to gym 207' total with S. Occasionally catches toe on floor with poor foot clearance towards end of trial due to fatigue. 2x10 reps sit <>Stand from mat table for LLE strengthening; cues for keeping one UE on support surface when initiating stand. Stairs with B handrails and S x12 total, 3" and 6" stairs with seated rest break after completing first round of corner stairs due to fatigue. Standing balance/endurance while shooting basketball; able to remove B hands from RW long enough to shoot ball and then return hands to RW for balance. Initially required use of mat table to support LLE, improved to not leaning on table and maintaining balance. UE ergometer level 8 x12 min for UE strengthening and aerobic endurance. W/c propulsion on level surface and ramp for endurance and UE strengthening; requires occasional rest breaks due to UE fatigue. Remained seated in w/c at end of session, all needs in reach.   Tx 2: Pt received supine in bed, denies pain and agreeable to treatment. Pt not wearing KI while in bed at this time; reports he does wear it at night but it is very heavy and makes it difficult to sleep. Discussed with pt importance of maintaining  hamstring ROM and pt verbalizes understanding. Supine>sit with modI, dons L shoe with modI and increased time. Performed UE strengthening exercises on PowerPlus; mod cues for technique and form with lat pull downs, tricep extensions, scapular rows. 3 sets 15 reps all exercises with 45 sec-1 min rest between. Gait x215' with RW and S overall; min cues for w/c setup at beginning of trial to remove amputee pad before standing. Remained seated in w/c at end of session, all needs in reach.   Therapy Documentation Precautions:  Precautions Precautions: Fall Required Braces or Orthoses: Knee Immobilizer - Right Restrictions Weight Bearing Restrictions: Yes RLE Weight Bearing: Non weight bearing   See Function Navigator for Current Functional Status.   Therapy/Group: Individual Therapy  Vista Lawmanlizabeth J Tygielski 01/25/2016, 10:30 AM

## 2016-01-25 NOTE — Progress Notes (Signed)
Occupational Therapy Session Note  Patient Details  Name: Gordon CousinFrederick Sween MRN: [id number] Date of Birth: 13-Dec-1965  Today's Date: 01/25/2016 OT Individual Time: 4098-11910730-0830 OT Individual Time Calculation (min): 60 min     Short Term Goals:Week 1:  OT Short Term Goal 1 (Week 1): LTG=STG 2/2 esitmated short LOS  Skilled Therapeutic Interventions/Progress Updates:    Pt seen for OT ADL bathing/dressing session. Pt in supine upon arrival finishing breakfast, agreeable to tx session and bathing at shower level. Pt stated he plans to use RW for ambulation within house as house is not conducive to w/c use. He ambulated with supervision to gather clothing items and complete toilet and shower transfers. He bathed seated on tub transfer bench mod I. He stood with use of grab bars and wall for stabilization support to dry off. Encourage pt to attempt mobility in ways he will be forced to at home and not rely on grab bars as he does not have those in home shower. He dressed seated EOB with increased time, standing with RW to pull pants up. He returned to bathroom and completed toilet transfer with BSC over toilet for increased steady assist during sit <> stand. Recommended this for use at d/c. Then practiced simulated shower stall transfer, using RW to hop over "shower threshold" (simulated with towel). Initially completed with min steadying assist, however progressed to supervision on subsequent trials. Pt returned to w/c at end of session, left seated in chair with all needs in reach.  Discussed d/c planning including meal prep. Pt plans to have wife prepare meals for him for day time and he will just be reposible for obtaining items and using microwave. Will practice in future sessions.  Also discussed recommendations for DME use at [facility] and as shower chair.   Therapy Documentation Precautions:  Precautions Precautions: Fall Required Braces or Orthoses: Knee Immobilizer -  Right Restrictions Weight Bearing Restrictions: Yes RLE Weight Bearing: Non weight bearing Pain: Pain Assessment Pain Assessment: 0-10 Pain Score: No/ denies pain ADL: ADL ADL Comments: See functional navigator  See Function Navigator for Current Functional Status.   Therapy/Group: Individual Therapy  Lewis, Maira Christon C 01/25/2016, 7:13 AM

## 2016-01-26 ENCOUNTER — Inpatient Hospital Stay (HOSPITAL_COMMUNITY): Payer: BLUE CROSS/BLUE SHIELD | Admitting: Physical Therapy

## 2016-01-26 ENCOUNTER — Inpatient Hospital Stay (HOSPITAL_COMMUNITY): Payer: BLUE CROSS/BLUE SHIELD | Admitting: Occupational Therapy

## 2016-01-26 LAB — GLUCOSE, CAPILLARY
GLUCOSE-CAPILLARY: 88 mg/dL (ref 65–99)
GLUCOSE-CAPILLARY: 205 mg/dL — AB (ref 65–99)
GLUCOSE-CAPILLARY: 89 mg/dL (ref 65–99)
Glucose-Capillary: 180 mg/dL — ABNORMAL HIGH (ref 65–99)

## 2016-01-26 MED ORDER — INSULIN GLARGINE 100 UNIT/ML ~~LOC~~ SOLN
20.0000 [IU] | Freq: Every day | SUBCUTANEOUS | Status: DC
  Administered 2016-01-26: 20 [IU] via SUBCUTANEOUS
  Filled 2016-01-26: qty 0.2

## 2016-01-26 NOTE — Progress Notes (Signed)
Occupational Therapy Session Note  Patient Details  Name: Gordon Walker MRN: [id number] Date of Birth: October 18, 1965  Today's Date: 01/26/2016 OT Individual Time: 0945-1100 OT Individual Time Calculation (min): 75 min     Short Term Goals:Week 1:  OT Short Term Goal 1 (Week 1): LTG=STG 2/2 esitmated short LOS  Skilled Therapeutic Interventions/Progress Updates:    Pt seen for OT ADL bathing/dressing session. Pt sitting up in w/c upon arrival, ready for bathing session at shower level. He gathered clothing from w/c level with min VCs for safety awareness as pt at high risk of flipping w/c/ falling out when leaning outside BOS of chair.  Bathroom shower set up in simulation of home environment. Pt ambulated into bathroom with RW, hopping over shower ledge to BSC inside shower. Emphasis on practicing without utilization of grab bars as he does not have these at home. Encouraged pt to brace self with UE in shower for dynamic balance in shower. He returned to w/c and dressed supervision- mod I.  In ADL apartment, pt completed simulated simple meal prep for RW level, obtaining items from refrigerator and overhead cabinets. Recommended pt keep commonly used items on counter level and also discussed how to transport items while also managing RW. Pt declined wanting walker bag. Following seated rest break on couch, pt made bed in apartment, occasional VCs required for safetty awareness/ problem solving when reaching to access all parts of bed.  He voiced need for snack and applesauce given.  Pt ambulated from ADL apartment back to room at end of session, left seated in w/c with all needs in reach.   Therapy Documentation Precautions:  Precautions Precautions: Fall Required Braces or Orthoses: Knee Immobilizer - Right Restrictions Weight Bearing Restrictions: Yes RLE Weight Bearing: Non weight bearing Pain: Pain Assessment Pain Score: 0-No pain ADL: ADL ADL Comments: See functional  navigator  See Function Navigator for Current Functional Status.   Therapy/Group: Individual Therapy  Lewis, Jazmina Muhlenkamp C 01/26/2016, 10:08 AM

## 2016-01-26 NOTE — Discharge Summary (Signed)
Discharge summary job # [id number]

## 2016-01-26 NOTE — Progress Notes (Signed)
Social Work Patient ID: Gordon Walker, male   DOB: 06/05/1965, 50 y.o.   MRN: [id number]   Met with pt following team conference and spoke with his wife via phone to review team conference.  Both aware and agreeable to targeted d/c date of 1/6 and mod independent goals.  Discussed HH and DME recs. No concerns.  , , LCSW  

## 2016-01-26 NOTE — Discharge Summary (Signed)
Gordon Walker:  Walker, Gordon           ACCOUNT NO.:  [id number]  MEDICAL RECORD NO.:  [id number]  LOCATION:  A11C                         FACILITY:  [facility]  PHYSICIAN:  Ranelle OysterZachary T. Swartz, M.D.DATE OF BIRTH:  1965/12/05  DATE OF ADMISSION:  01/20/2016 DATE OF DISCHARGE:  01/28/2016                              DISCHARGE SUMMARY   DISCHARGE DIAGNOSES: 1. 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 2. Subcutaneous Lovenox for deep venous thrombosis prophylaxis. 3. Pain management. 4. Acute blood loss anemia. 5. Diabetes mellitus peripheral neuropathy. 6. Constipation. 7. Escherichia coli bacteremia.  HISTORY OF PRESENT ILLNESS:  This is a 51 year old right-handed male with history of diabetes mellitus, hypertension, peripheral vascular disease with a left TMA on July 15, 2015, as well as right 5th ray amputation December 30, 2015.  Lives with wife, independent prior to admission.  His wife works 2nd shift.  Presented January 14, 2016, with ongoing bouts of nausea, associated epigastric pain, creatinine 2.40 from baseline 1.36. WBC 24000, found to be in DKA.  Blood cultures, E coli bacteremia, placed on broad-spectrum antibiotics, simplified Ancef. Follow up orthopedic Services for poor healing gangrenous changes, right foot, and limb was no longer felt to be salvageable and underwent right below-knee amputation January 18, 2016, per Dr. Ophelia CharterYates.  HOSPITAL COURSE:  Pain management.  Rapid response, January 18, 2016, for reported unresponsiveness.  He did receive Narcan and mental status improved.  Subcutaneous Lovenox for DVT prophylaxis.  Acute on chronic anemia 7.0, he was transfused as well as hyponatremia, 129 and monitored Physical and occupational therapy ongoing.  The patient was admitted for comprehensive rehab program.  PAST MEDICAL HISTORY:  See discharge diagnoses.  SOCIAL HISTORY:  Married. Lives with his  wife, independent prior to admission.  Functional status upon admission to rehab services was min assist 2 feet for ambulation; minimal assist, stand pivot transfers; min to mod assist for activities of daily living.  PHYSICAL EXAMINATION:  VITAL SIGNS:  Blood pressure 143/85, pulse 88, temperature 98, respirations 17. GENERAL:  This was an alert male, well developed. HEENT:  Pupils round and reactive to light.  Normocephalic. NECK:  No JVD.  No tracheal deviation. CARDIAC:  Regular rate and rhythm.  Normal heart sounds. LUNGS:  Clear to auscultation.  No respiratory distress. ABDOMEN:  Soft, nontender, no distention. EXTREMITIES:  Right below-knee amputation site was dressed with a small amount of serosanguineous drainage.  Left TMA intact.  REHABILITATION AND HOSPITAL COURSE:  The patient was admitted to inpatient rehab services with therapies initiated on a 3-hour daily basis, consisting of physical therapy, occupational therapy, and rehabilitation nursing.  The following issues were addressed during the patient's rehabilitation stay.  Pertaining to Mr. Gordon Walker right BKA January 18, 2016, related to E coli bacteremia.  Surgical site healing nicely.  Await plan for possible stump shrinker.  He would follow up Dr. Ophelia CharterYates.  History of left TMA well healed.  Subcutaneous Lovenox for DVT prophylaxis.  No bleeding episodes.  He was using hydrocodone on a limited basis for pain.  Acute blood loss anemia of 11 remaining stable. Blood sugars controlled.  Hemoglobin A1c of 8.2.  He  remained on insulin therapy, was full diabetic teaching.  Bouts of constipation resolved with laxative assistance.  The patient remained on Ancef for E. coli bacteremia, completed January 27, 2016 and transitioned to Keflex 1 week discharge, remaining afebrile.  The patient received weekly collaborative interdisciplinary team conferences to discuss estimated length of stay, family teaching, any barriers to  his discharge.  He was ambulating with a rolling Walker.  Extended distances supervision.  Stairs with bilateral hand rails.  Standing balance was good.  He was able to remove his hands from his rolling Walker.  Long enough to shoot a basketball and return his hands to the rolling Walker for balance.  He could gather belongings for activities of daily living and homemaking.  Ambulated with supervision to gather his items.  Full family teaching was completed and plan discharge to home.  DISCHARGE MEDICATIONS:  Included: 1. NovoLog 4 units t.i.d. 2. Lantus insulin 15 units at bedtime. 3. Hydrocodone 1-2 tablets every 4 hours as needed for pain. 4. Lisinopril 10 mg daily 5. Aspirin 81 mg daily 6. Keflex 500 mg 4 times a day 1 week and stop 7. Ferrous sulfate 325 mg twice a day 8. Lipitor 20 mg daily  DIET:  Diabetic diet.  FOLLOWUP:  He would follow up Dr. Faith Rogue at the outpatient rehab service office as directed; Dr. Annell Greening call for appointment; Dr. Docia Chuck medical management.     Mariam Dollar, P.A.   ______________________________ Ranelle Oyster, M.D.    DA/MEDQ  D:  01/26/2016  T:  01/26/2016  Job:  [id number]  cc:   Veverly Fells. Ophelia Charter, M.D. Ranelle Oyster, M.D. Carilyn Goodpasture Docia Chuck, MD

## 2016-01-26 NOTE — Progress Notes (Signed)
Physical Therapy Session Note  Patient Details  Name: Gordon Walker MRN: [id number] Date of Birth: [birth year]  Today's Date: 01/26/2016 PT Individual Time: 4098-11910730-0855 and 1400-1500 PT Individual Time Calculation (min): 85 min and 60 min (total 145 min)    Short Term Goals: Week 1:  PT Short Term Goal 1 (Week 1): Pt will ambulate 3425' with RW and min assist PT Short Term Goal 2 (Week 1): Pt will initiate stair training with PT PT Short Term Goal 3 (Week 1): Pt will demo dynamic standing balance with min assist  Skilled Therapeutic Interventions/Progress Updates:   Tx 1: Pt received seated in w/c at sink completing grooming; denies pain and agreeable to treatment. Pt performs sit <>stand at sink while grooming with S and light UE use on sink for balance, modI overall. Gait 3x150-200' per trial with RW and distant S, including gait on carpeted surface and transfers from various chair/sofa heights without assist. Seated/supine/sidelying exercises including long arc quad, hip flexor stretch, straight leg raise, bridging, crunches, side lying hip abduction, seated hamstring stretch. Provided pt with handout packet with pictures and written directions for all exercises. 1.5# ankle cuff weight on RLE at knee to increase resistance. Pt's wife present for last 30 min of session; provided education regarding modI goals, S recommended for stairs and stressed importance of having a second rail installed for safety. Pt demonstrated ascent/descent of stairs with B handrails and S, car transfer with S using RW, and gait on ramp, uneven surface and curb step with RW and S. Wife with no further questions regarding d/c or safety at home. Returned to room w/c propulsion modI and remained seated in w/c at end of session, all needs in reach.   Tx 2: Pt received seated in w/c, denies pain and agreeable to treatment. W/c propulsion modI to gym. Performed semi-reclined sit ups and fully reclined sit ups with weighted ball  toss for core/UE strengthening and endurance. Rotational crunches 2x15 reps alternating UE/LE reaching. Seated on stability ball with min/modA to maintain balance; reduced assist with repetition and increased coordination of trunk musculature. Gait x225' with RW and distant S. Seated nustep x10 min with BUE/LLE for strengthening and aerobic endurance. Pt continues to require cueing for safe setup of w/c prior to transfer to remove all leg rests; pt states "I know you want me to do it but it's just more work". Discussed with pt importance of reducing all obstacles when attempting to transfer to make as safe as possible and reduce risk of falls, educated pt regarding importance of slowing down activities to ensure safety rather than worrying about speed of completing task. Returned to room with w/c propulsion modI. Remained seated in w/c at end of session, all needs in reach.   Therapy Documentation Precautions:  Precautions Precautions: Fall Required Braces or Orthoses: Knee Immobilizer - Right Restrictions Weight Bearing Restrictions: Yes RLE Weight Bearing: Non weight bearing   See Function Navigator for Current Functional Status.   Therapy/Group: Individual Therapy  Vista Lawmanlizabeth J Tygielski 01/26/2016, 8:57 AM

## 2016-01-26 NOTE — Patient Care Conference (Signed)
Inpatient RehabilitationTeam Conference and Plan of Care Update Date: 01/24/2016   Time: 2:05 pm    Patient Name: Gordon CousinFrederick Berringer      Medical Record Number: [id number]  Date of Birth: Dec 17, 1965 Sex: Male         Room/Bed: 4W05C/4W05C-01 Payor Info: Payor: BLUE CROSS BLUE [employer]IELD / Plan: BCBS OTHER / Product Type: *No Product type* /    Admitting Diagnosis: R BKA  Admit Date/Time:  01/20/2016  4:28 PM Admission Comments: No comment available   Primary Diagnosis:  Amputation of right lower extremity below knee (HCC) Principal Problem: Amputation of right lower extremity below knee [facility](HCC)  Patient Active Problem List   Diagnosis Date Noted  . Acute blood loss anemia 01/20/2016  . Amputation of right lower extremity below knee (HCC) 01/20/2016  . Hyponatremia 01/17/2016  . AKI (acute kidney injury) (HCC) 01/17/2016  . Hyperbilirubinemia 01/17/2016  . Bacteremia due to Escherichia coli 01/17/2016  . Sepsis (HCC) 01/17/2016  . DKA (diabetic ketoacidoses) (HCC) 01/15/2016  . Gas gangrene of foot (HCC)   . Foot amputation status, right (HCC) 01/05/2016  . Ulcer of toe of right foot, with necrosis of bone (HCC) 12/27/2015  . Status post transmetatarsal amputation of foot, left (HCC) 11/24/2015  . Controlled diabetes mellitus with diabetic peripheral angiopathy and gangrene (HCC)   . 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 Transaminitis 01/07/2015  . Osteomyelitis of left foot (HCC) 01/06/2015  . Uncontrolled diabetes mellitus with foot ulcer, with long-term current use of insulin (HCC) 01/06/2015  . Anemia 01/06/2015  . Osteomyelitis (HCC) 01/05/2015    Expected Discharge Date: Expected Discharge Date: 01/28/16  Team Members Present: Physician leading conference: Dr. Faith RogueZachary Swartz Social Worker Present: Amada JupiterLucy Sherrice Creekmore, LCSW Nurse Present: Ronny BaconWhitney Reardon, RN PT Present: Alyson ReedyElizabeth Tygielski, PT OT  Present: Johnsie CancelAmy Lewis, OT SLP Present: Feliberto Gottronourtney Payne, SLP PPS Coordinator present : Tora DuckMarie Noel, RN, CRRN     Current Status/Progress Goal Weekly Team Focus  Medical   right bka. wound healing, daily dressing. pain controlled. hgb recovering  improve functional mobility  wound care, diabetes mgt, pre-pros ed   Bowel/Bladder   continent of b/b, last bm 12/31, pt states normal for him, needs assist to empty urinal, no stool softeners or laxatives ordered at thistime  maintain b/b status with min assist  assess q shift and prn,    Swallow/Nutrition/ Hydration             ADL's   Supervision- min A overall  Mod I  w/c safety/ mobility, functional transfers, ADL re-training; d/c planning   Mobility   S gait and transfers, min guard stairs  modI overall, S car transfers, stairs and community gait  activity tolerance, LE strengthening, gait and stairs   Communication             Safety/Cognition/ Behavioral Observations            Pain   norco 7.5/325 one or two q 4 hours ATC presently maintaining pt with pain control  maintain pain <2 min assist  monitor pain level q 4 hours and give norco 1 or 2  tabs prn to maintain pain <2   Skin   surgical wound R BKA, vaseline gauze, gauze pad and compression wrap, change daily and prn. no other skin issue  wound healing, pt or family independent with dressing / compression wrap  monitor dressing and change daily or prn,  teach dressing change / compression wrap to pt/family.    Rehab Goals Patient on target to meet rehab goals: Yes *See Care Plan and progress notes for long and short-term goals.  Barriers to Discharge: non id'ed    Possible Resolutions to Barriers:  n/a    Discharge Planning/Teaching Needs:  home with intermittent assist of family/ wife      Team Discussion:  Flat affect but participating and expecting mod independent goals.  No concerns from team.  Discussion of follow up and DME needs.  Revisions to Treatment Plan:  None    Continued Need for Acute Rehabilitation Level of Care: The patient requires daily medical management by a physician with specialized training in physical medicine and rehabilitation for the following conditions: Daily direction of a multidisciplinary physical rehabilitation program to ensure safe treatment while eliciting the highest outcome that is of practical value to the patient.: Yes Daily medical management of patient stability for increased activity during participation in an intensive rehabilitation regime.: Yes Daily analysis of laboratory values and/or radiology reports with any subsequent need for medication adjustment of medical intervention for : Diabetes problems;Post surgical problems  Senta Kantor 01/26/2016, 10:54 AM

## 2016-01-26 NOTE — Progress Notes (Addendum)
[facility] PHYSICAL MEDICINE & REHABILITATION     PROGRESS NOTE    Subjective/Complaints: Up with PT in the gym. No new complaints. Asked about shrinker.  ROS: pt denies nausea, vomiting, diarrhea, cough, shortness of breath or chest pain   Objective: Vital Signs: Blood pressure (!) 147/85, pulse 87, temperature 98.5 F (36.9 C), temperature source Oral, resp. rate 18, height 6' (1.829 m), weight 80 kg (176 lb 5.9 oz), SpO2 97 %. No results found. No results for input(s): WBC, HGB, HCT, PLT in the last 72 hours. No results for input(s): NA, K, CL, GLUCOSE, BUN, CREATININE, CALCIUM in the last 72 hours.  Invalid input(s): CO CBG (last 3)   Recent Labs  01/25/16 1647 01/25/16 2051 01/26/16 0620  GLUCAP 157* 81 180*    Wt Readings from Last 3 Encounters:  01/26/16 80 kg (176 lb 5.9 oz)  01/20/16 78.7 kg (173 lb 9.6 oz)  12/30/15 84.4 kg (186 lb)    Physical Exam:  Constitutional: He appears well-developed. No distress HENT:  Head: Normocephalic.  Eyes: EOM are normal.  Neck: Normal range of motion. Neck supple. No tracheal deviation present. No thyromegaly present.  Cardiovascular: RRR.  Respiratory:  CTA B  GI: Soft. Bowel sounds are normal. He exhibits no distension. There is no tenderness.  Musculoskeletal:  Right bka with edema. Left TMA intact.  Neurological: Alert Follows full commands. Bilateral UE 5/5. RLE: HF 3-4/5. KE limited by pain. LLE grossly 4-5/5. Senses pain and gross touch in left foot/leg  Skin: Callus left lateral foot stable. Right BK incision clean with minimal s/s drainage decreasing.  Psych: very flat/stoic  Assessment/Plan: 1. Functional deficits secondary to right BKA which require 3+ hours per day of interdisciplinary therapy in a comprehensive inpatient rehab setting. Physiatrist is providing close team supervision and 24 hour management of active medical problems listed below. Physiatrist and rehab team continue to assess barriers to  discharge/monitor patient progress toward functional and medical goals.  Function:  Bathing Bathing position   Position: Shower  Bathing parts Body parts bathed by patient: Right arm, Left arm, Chest, Abdomen, Front perineal area, Buttocks, Right upper leg, Left upper leg, Left lower leg, Back Body parts bathed by helper: Back, Buttocks  Bathing assist Assist Level: More than reasonable time      Upper Body Dressing/Undressing Upper body dressing   What is the patient wearing?: Pull over shirt/dress     Pull over shirt/dress - Perfomed by patient: Thread/unthread right sleeve, Thread/unthread left sleeve, Put head through opening, Pull shirt over trunk          Upper body assist Assist Level: No help, No cues   Set up : To obtain clothing/put away  Lower Body Dressing/Undressing Lower body dressing   What is the patient wearing?: Underwear, Pants, Non-skid slipper socks Underwear - Performed by patient: Thread/unthread right underwear leg, Thread/unthread left underwear leg, Pull underwear up/down Underwear - Performed by helper: Pull underwear up/down Pants- Performed by patient: Thread/unthread right pants leg, Thread/unthread left pants leg, Pull pants up/down Pants- Performed by helper: Pull pants up/down Non-skid slipper socks- Performed by patient: Don/doff left sock (R N/A)   Socks - Performed by patient: Don/doff left sock   Shoes - Performed by patient: Don/doff left shoe            Lower body assist Assist for lower body dressing: Supervision or verbal cues      Toileting Toileting Toileting activity did not occur: No continent bowel/bladder event  Toileting steps completed by patient: Adjust clothing prior to toileting, Performs perineal hygiene, Adjust clothing after toileting   Toileting Assistive Devices: Grab bar or rail  Toileting assist Assist level: Supervision or verbal cues   Transfers Chair/bed transfer   Chair/bed transfer method: Squat  pivot Chair/bed transfer assist level: No Help, no cues, assistive device, takes more than a reasonable amount of time Chair/bed transfer assistive device: Bedrails     Locomotion Ambulation     Max distance: 207 Assist level: Supervision or verbal cues   Wheelchair   Type: Manual Max wheelchair distance: 300 Assist Level: No help, No cues, assistive device, takes more than reasonable amount of time  Cognition Comprehension Comprehension assist level: Follows complex conversation/direction with extra time/assistive device  Expression Expression assist level: Expresses complex ideas: With extra time/assistive device  Social Interaction Social Interaction assist level: Interacts appropriately 90% of the time - Needs monitoring or encouragement for participation or interaction.  Problem Solving Problem solving assist level: Solves complex problems: With extra time  Memory Memory assist level: Recognizes or recalls 90% of the time/requires cueing < 10% of the time   Medical Problem List and Plan:  1. 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  -continue therapies. ELOS 1/6 -will try shrinker tomorrow if wound ready 2. DVT Prophylaxis/Anticoagulation: Subcutaneous Lovenox. Monitor platelet counts and any signs of bleeding  3. Pain Management: Hydrocodone as needed with control 4. Acute blood loss anemia. hgb up to 11  -no signs of substantial blood loss.  5. Neuropsych: This patient is capable of making decisions on his own behalf.   -team to provide ego support as needed---remains very flat 6. Skin/Wound Care: xeroform, gauze, ACE daily can continue 7. Fluids/Electrolytes/Nutrition: pt with good po intake 8. Diabetes mellitus with peripheral neuropathy. Hemoglobin A1c 8.2. NovoLog 4 units 3 times a day with meals, Lantus insulin currently at 10mg  qhs.   -converting back to bedtime admin increase to  20mg  tonight 9. Constipation. Laxative assistance  10. ID: pt continues on Ancef --through 01/27/16   LOS (Days) 6 A FACE TO FACE EVALUATION WAS PERFORMED  Ranelle Oyster, MD 01/26/2016 8:34 AM

## 2016-01-27 ENCOUNTER — Inpatient Hospital Stay (HOSPITAL_COMMUNITY): Payer: BLUE CROSS/BLUE SHIELD | Admitting: Occupational Therapy

## 2016-01-27 ENCOUNTER — Inpatient Hospital Stay (HOSPITAL_COMMUNITY): Payer: BLUE CROSS/BLUE SHIELD | Admitting: Physical Therapy

## 2016-01-27 LAB — GLUCOSE, CAPILLARY
GLUCOSE-CAPILLARY: 57 mg/dL — AB (ref 65–99)
GLUCOSE-CAPILLARY: 137 mg/dL — AB (ref 65–99)
GLUCOSE-CAPILLARY: 60 mg/dL — AB (ref 65–99)
Glucose-Capillary: 130 mg/dL — ABNORMAL HIGH (ref 65–99)
Glucose-Capillary: 234 mg/dL — ABNORMAL HIGH (ref 65–99)
Glucose-Capillary: 184 mg/dL — ABNORMAL HIGH (ref 65–99)

## 2016-01-27 MED ORDER — INSULIN GLARGINE 100 UNIT/ML ~~LOC~~ SOLN: 20.0000 [IU] | Freq: Every day | SUBCUTANEOUS | 11 refills | Status: DC

## 2016-01-27 MED ORDER — CEPHALEXIN 500 MG PO CAPS: 500.0000 mg | ORAL_CAPSULE | Freq: Four times a day (QID) | ORAL | 0 refills | Status: AC

## 2016-01-27 MED ORDER — LISINOPRIL 10 MG PO TABS: 10.0000 mg | ORAL_TABLET | Freq: Every day | ORAL | 0 refills | Status: DC

## 2016-01-27 MED ORDER — INSULIN GLARGINE 100 UNIT/ML ~~LOC~~ SOLN: 15.0000 [IU] | Freq: Every day | SUBCUTANEOUS | 11 refills | Status: DC

## 2016-01-27 MED ORDER — FERROUS SULFATE 325 (65 FE) MG PO TABS: 325.0000 mg | ORAL_TABLET | Freq: Two times a day (BID) | ORAL | 0 refills | Status: DC

## 2016-01-27 MED ORDER — HYDROCODONE-ACETAMINOPHEN 7.5-325 MG PO TABS: 1.0000 | ORAL_TABLET | ORAL | 0 refills | Status: DC | PRN

## 2016-01-27 MED ORDER — ATORVASTATIN CALCIUM 20 MG PO TABS: 20.0000 mg | ORAL_TABLET | Freq: Every day | ORAL | 0 refills | Status: DC

## 2016-01-27 MED ORDER — INSULIN GLARGINE 100 UNIT/ML ~~LOC~~ SOLN
15.0000 [IU] | Freq: Every day | SUBCUTANEOUS | Status: DC
  Administered 2016-01-27: 15 [IU] via SUBCUTANEOUS
  Filled 2016-01-27 (×2): qty 0.15

## 2016-01-27 NOTE — Progress Notes (Signed)
[facility] PHYSICAL MEDICINE & REHABILITATION     PROGRESS NOTE    Subjective/Complaints: Up in bed. No new issues except for sugar being 57 this morning.   ROS: pt denies nausea, vomiting, diarrhea, cough, shortness of breath or chest pain   Objective: Vital Signs: Blood pressure 126/80, pulse 77, temperature 98.5 F (36.9 C), temperature source Oral, resp. rate 18, height 6' (1.829 m), weight 80.5 kg (177 lb 8 oz), SpO2 99 %. No results found. No results for input(s): WBC, HGB, HCT, PLT in the last 72 hours. No results for input(s): NA, K, CL, GLUCOSE, BUN, CREATININE, CALCIUM in the last 72 hours.  Invalid input(s): CO CBG (last 3)   Recent Labs  01/26/16 2159 01/27/16 0636 01/27/16 0730  GLUCAP 89 57* 130*    Wt Readings from Last 3 Encounters:  01/27/16 80.5 kg (177 lb 8 oz)  01/20/16 78.7 kg (173 lb 9.6 oz)  12/30/15 84.4 kg (186 lb)    Physical Exam:  Constitutional: He appears well-developed. No distress HENT:  Head: Normocephalic.  Eyes: EOM are normal.  Neck: Normal range of motion. Neck supple. No tracheal deviation present. No thyromegaly present.  Cardiovascular: RRR.  Respiratory:  CTA B  GI: Soft. Bowel sounds are normal. He exhibits no distension. There is no tenderness.  Musculoskeletal:  Right bka with decreased edema. Left TMA intact.  Neurological: Alert Follows full commands. Bilateral UE 5/5. RLE: HF 3-4/5. KE limited by pain. LLE grossly 4-5/5. Senses pain and gross touch in left foot/leg  Skin: Callus left lateral foot stable. Right BK incision with scab and small areas of serosanguinous drainage Psych: very flat/stoic  Assessment/Plan: 1. Functional deficits secondary to right BKA which require 3+ hours per day of interdisciplinary therapy in a comprehensive inpatient rehab setting. Physiatrist is providing close team supervision and 24 hour management of active medical problems listed below. Physiatrist and rehab team continue to  assess barriers to discharge/monitor patient progress toward functional and medical goals.  Function:  Bathing Bathing position   Position: Shower  Bathing parts Body parts bathed by patient: Right arm, Left arm, Chest, Abdomen, Front perineal area, Buttocks, Right upper leg, Left upper leg, Left lower leg, Back Body parts bathed by helper: Back, Buttocks  Bathing assist Assist Level: More than reasonable time      Upper Body Dressing/Undressing Upper body dressing   What is the patient wearing?: Pull over shirt/dress     Pull over shirt/dress - Perfomed by patient: Thread/unthread right sleeve, Thread/unthread left sleeve, Put head through opening, Pull shirt over trunk          Upper body assist Assist Level: No help, No cues   Set up : To obtain clothing/put away  Lower Body Dressing/Undressing Lower body dressing   What is the patient wearing?: Underwear, Pants, Non-skid slipper socks Underwear - Performed by patient: Thread/unthread right underwear leg, Thread/unthread left underwear leg, Pull underwear up/down Underwear - Performed by helper: Pull underwear up/down Pants- Performed by patient: Thread/unthread right pants leg, Thread/unthread left pants leg, Pull pants up/down Pants- Performed by helper: Pull pants up/down Non-skid slipper socks- Performed by patient: Don/doff left sock (R N/A)   Socks - Performed by patient: Don/doff left sock   Shoes - Performed by patient: Don/doff left shoe            Lower body assist Assist for lower body dressing: Supervision or verbal cues      Toileting Toileting Toileting activity did not occur: No  continent bowel/bladder event Toileting steps completed by patient: Adjust clothing prior to toileting, Performs perineal hygiene, Adjust clothing after toileting   Toileting Assistive Devices: Grab bar or rail  Toileting assist Assist level: Set up/obtain supplies   Transfers Chair/bed transfer   Chair/bed transfer  method: Squat pivot, Stand pivot Chair/bed transfer assist level: No Help, no cues, assistive device, takes more than a reasonable amount of time Chair/bed transfer assistive device: Walker, Designer, fashion/clothing     Max distance: 200 Assist level: Supervision or verbal cues   Wheelchair   Type: Manual Max wheelchair distance: 300 Assist Level: No help, No cues, assistive device, takes more than reasonable amount of time  Cognition Comprehension Comprehension assist level: Follows complex conversation/direction with extra time/assistive device  Expression Expression assist level: Expresses complex ideas: With extra time/assistive device  Social Interaction Social Interaction assist level: Interacts appropriately with others with medication or extra time (anti-anxiety, antidepressant).  Problem Solving Problem solving assist level: Solves complex 90% of the time/cues < 10% of the time  Memory Memory assist level: Recognizes or recalls 90% of the time/requires cueing < 10% of the time   Medical Problem List and Plan:  1. 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  -continue therapies. ELOS 1/6 -Patient to see me in the office for transitional care encounter in 1-2 weeks.  2. DVT Prophylaxis/Anticoagulation: Subcutaneous Lovenox. Monitor platelet counts and any signs of bleeding  3. Pain Management: Hydrocodone as needed with control 4. Acute blood loss anemia. hgb up to 11  -no signs of substantial blood loss.  5. Neuropsych: This patient is capable of making decisions on his own behalf.   -team to provide ego support as needed---remains very flat 6. Skin/Wound Care: can dc xeroform, but still needs 4x4's, kerlix, and ACE as wound still with slight drainage   -can switch to shrinker as outpt 7. Fluids/Electrolytes/Nutrition: pt with good po intake 8. Diabetes mellitus with  peripheral neuropathy. Hemoglobin A1c 8.2. NovoLog 4 units 3 times a day with meals, Lantus insulin currently at 10mg  qhs. But cbg's running low especially in am  -decrease hs lantus to 15 units (i suspect he's eating better here)  -encouraged HS snack 9. Constipation. Laxative assistance  10. ID: pt continues on Ancef --through today   LOS (Days) 7 A FACE TO FACE EVALUATION WAS PERFORMED  Ranelle Oyster, MD 01/27/2016 9:02 AM

## 2016-01-27 NOTE — Discharge Instructions (Signed)
Inpatient Rehab Discharge Instructions  Charletta CousinFrederick Ogarro Discharge date and time: No discharge date for patient encounter.   Activities/Precautions/ Functional Status: Activity: activity as tolerated Diet: diabetic diet Wound Care: keep wound clean and dry Functional status:  ___ No restrictions     ___ Walk up steps independently ___ 24/7 supervision/assistance   ___ Walk up steps with assistance ___ Intermittent supervision/assistance  ___ Bathe/dress independently ___ Walk with walker     ___ Bathe/dress with assistance ___ Walk Independently    ___ Shower independently ___ Walk with assistance    ___ Shower with assistance ___ No alcohol     ___ Return to work/school ________     COMMUNITY REFERRALS UPON DISCHARGE:    Home Health:   PT       RN                     Agency:  [facility]      Phone: [phone]   Medical Equipment/Items Ordered: wheelchair, cushion, commode                                                     Agency/Supplier:  [facility] @ [phone]   GENERAL COMMUNITY RESOURCES FOR PATIENT/FAMILY:  Support Groups:  Amputee Support Group (handout)      Special Instructions:  Resume aspirin therapy 81 mg daily  My questions have been answered and I understand these instructions. I will adhere to these goals and the provided educational materials after my discharge from the hospital.  Patient/Caregiver Signature _______________________________ Date __________  Clinician Signature _______________________________________ Date __________  Please bring this form and your medication list with you to all your follow-up doctor's appointments.

## 2016-01-27 NOTE — Progress Notes (Signed)
Occupational Therapy Session Note  Patient Details  Name: Gordon Walker MRN: [id number] Date of Birth: 1965-12-21  Today's Date: 01/27/2016 OT Individual Time: 0845-1000 and 1300-1330 OT Individual Time Calculation (min): 75 min and 30 min    Short Term Goals:Week 1:  OT Short Term Goal 1 (Week 1): LTG=STG 2/2 esitmated short LOS  Skilled Therapeutic Interventions/Progress Updates:    Session One: Pt seen for OT ADL bathing/dressing session. Pt sitting up in w/c upon arrival, agreeable to tx session. He ambulated throughout session using RW mod I. He bathed seated on BSC mod I. He returned to EOB and dressed, completing lateral leans to pull pants up. Grooming completed in standing mod I at sink. He self propelled w/c to therapy gym where modifications were made to w/c for increased support/ positioning for residual R LE. Pt transferred to mat via squat pivot mod I. VCs provided for proper set-up of w/c prior to pivot transfer, however, pt stated he wouldn't do it that way at home and therefore declined to do it that way in tx session today. Supine on mat, pt completed HEP for hip AB/ADduction and x10 reps of leg lifts.  Pt returned to room in w/ mod I, left seated with all needs in reach. Pt concerned as he has noticied R ankle swelling. Will make PA aware. Encouraged pt to take it easy walking/standing on L LE and to utilize w/c for further distance mobility.   Session Two: Pt seen for OT session focusing on kitchen mobility at RW and w/c level. Pt sitting up in w/c upon arrival, agreeable to tx session. He self propelled w/c to ADL apartment mod I. Completed kitchen mobility with RW mod I, intermittent VCs for safety awareness/ problem solving. Recommended using w/c during some kitchen tasks to ease transporting items/ increase safety as pt with LOB when attempting to carry items while managing RW. Maneuvered in w/c throughout kitchen and practiced sit <> stands to cabinet without RW. Pt  returned to room at end of session. Made mod I in room in prep for d/c home tomorrow. All needs in reach.   Therapy Documentation Precautions:  Precautions Precautions: Fall Required Braces or Orthoses: Knee Immobilizer - Right Restrictions Weight Bearing Restrictions: Yes RLE Weight Bearing: Non weight bearing Pain:   No/denies pain ADL: ADL ADL Comments: See functional navigator  See Function Navigator for Current Functional Status.   Therapy/Group: Individual Therapy  Lewis, Pattrick Bady C 01/27/2016, 6:57 AM

## 2016-01-27 NOTE — Progress Notes (Signed)
Occupational Therapy Discharge Summary  Patient Details  Name: Keir Viernes MRN: [id number] Date of Birth: 09-09-1965   Patient has met 8 of 8 long term goals due to improved activity tolerance, improved balance, postural control, ability to compensate for deficits and improved coordination.  Patient to discharge at overall Modified Independent level.  Patient d/c home with wife, however, will be home alone during the day.   Recommendation:  Patient with no further OT needs  Equipment: [facility]  Reasons for discharge: treatment goals met and discharge from hospital  Patient/family agrees with progress made and goals achieved: Yes  OT Discharge Precautions/Restrictions  Precautions Precautions: Fall Restrictions Weight Bearing Restrictions: Yes RLE Weight Bearing: Non weight bearing ADL ADL ADL Comments: See functional navigator Vision/Perception  Vision- History Baseline Vision/History: Wears glasses (Contacts) Wears Glasses: At all times Patient Visual Report: No change from baseline  Cognition Overall Cognitive Status: Within Functional Limits for tasks assessed Arousal/Alertness: Awake/alert Orientation Level: Oriented X4 Memory: Appears intact Awareness: Appears intact Safety/Judgment: Appears intact Comments: Slight decreased safety awareness Sensation Sensation Light Touch: Appears Intact Proprioception: Appears Intact Motor  Motor Motor: Within Functional Limits Trunk/Postural Assessment  Cervical Assessment Cervical Assessment: Within Functional Limits Thoracic Assessment Thoracic Assessment: Within Functional Limits Lumbar Assessment Lumbar Assessment: Within Functional Limits Postural Control Postural Control: Within Functional Limits  Balance Balance Balance Assessed: Yes Static Standing Balance Static Standing - Balance Support: During functional activity;Right upper extremity supported;Left upper extremity supported Static Standing - Level of  Assistance: 6: Modified independent (Device/Increase time) Dynamic Standing Balance Dynamic Standing - Balance Support: During functional activity;Right upper extremity supported;Left upper extremity supported Dynamic Standing - Level of Assistance: 6: Modified independent (Device/Increase time) Extremity/Trunk Assessment RUE Assessment RUE Assessment: Within Functional Limits LUE Assessment LUE Assessment: Within Functional Limits   See Function Navigator for Current Functional Status.  Lewis, Zalia Hautala C 01/27/2016, 3:00 PM

## 2016-01-27 NOTE — Progress Notes (Signed)
Physical Therapy Discharge Summary  Patient Details  Name: Gordon Walker MRN: [id number] Date of Birth: 01-26-1965  Today's Date: 01/27/2016 PT Individual Time: 1030-1130 PT Individual Time Calculation (min): 60 min   Pt performed w/c mobility in home, controlled and community environment with mod I.  UBE x 8 minutes alternating fwd/bkwd level 5.  Supine ab and LE therex with pt able to perform from HEP without cuing.  Stair negotiation with supervision, gait in home and controlled enviornments with RW with mod I, car, bed and chair transfers all with mod I. Pt reports he feels safe to d/c home with wife tomorrow.  Patient has met 10 of 10 long term goals due to improved activity tolerance, improved balance, improved postural control, increased strength, increased range of motion, decreased pain and ability to compensate for deficits.  Patient to discharge at an ambulatory level Modified Independent.   Patient's care partner is independent to provide the necessary intermittent supervision assistance at discharge.  Reasons goals not met: n/a  Recommendation:  Patient will benefit from ongoing skilled PT services in home health setting to continue to advance safe functional mobility, address ongoing impairments in strength, gait, ROM, and minimize fall risk.  Equipment: w/c  Reasons for discharge: treatment goals met and discharge from hospital  Patient/family agrees with progress made and goals achieved: Yes  PT Discharge Precautions/Restrictions Precautions Precautions: Fall Restrictions Weight Bearing Restrictions: Yes RLE Weight Bearing: Non weight bearing Pain Pain Assessment Pain Assessment: No/denies pain  Cognition Overall Cognitive Status: Within Functional Limits for tasks assessed Arousal/Alertness: Awake/alert Orientation Level: Oriented X4 Sensation Sensation Light Touch: Appears Intact Proprioception: Appears Intact Coordination Gross Motor Movements are  Fluid and Coordinated: Yes Fine Motor Movements are Fluid and Coordinated: Yes Motor  Motor Motor: Within Functional Limits   Trunk/Postural Assessment  Cervical Assessment Cervical Assessment: Within Functional Limits Thoracic Assessment Thoracic Assessment: Within Functional Limits Lumbar Assessment Lumbar Assessment: Within Functional Limits Postural Control Postural Limitations: improved  Balance Static Standing Balance Static Standing - Level of Assistance: 6: Modified independent (Device/Increase time) Extremity Assessment      RLE Assessment RLE Assessment: Within Functional Limits LLE Assessment LLE Assessment: Within Functional Limits   See Function Navigator for Current Functional Status.  DONAWERTH,KAREN 01/27/2016, 10:58 AM

## 2016-01-28 LAB — GLUCOSE, CAPILLARY
GLUCOSE-CAPILLARY: 50 mg/dL — AB (ref 65–99)
GLUCOSE-CAPILLARY: 129 mg/dL — AB (ref 65–99)
Glucose-Capillary: 105 mg/dL — ABNORMAL HIGH (ref 65–99)

## 2016-01-28 NOTE — Progress Notes (Signed)
Pt discharged approximately 1630 accompanied by his wife. Pt's wife verbalized understanding of dressing changes and wrapping Rt BKA. They deny questions at this time. Pt in possession of discharge instructions and scripts. Nurse tech escorted patient and his wife to lobby.  Luane SchoolLaurie Terren Jandreau, RN

## 2016-01-28 NOTE — Progress Notes (Signed)
PHYSICAL MEDICINE & REHABILITATION     PROGRESS NOTE    Subjective/Complaints: Up in bed. Low blood sugar this a.m., recovered with food. ROS: pt denies nausea, vomiting, diarrhea, cough, shortness of breath or chest pain   Objective: Vital Signs: Blood pressure (!) 149/85, pulse 76, temperature 98 F (36.7 C), temperature source Oral, resp. rate 18, height 6' (1.829 m), weight 78.8 kg (173 lb 12.8 oz), SpO2 100 %. No results found. No results for input(s): WBC, HGB, HCT, PLT in the last 72 hours. No results for input(s): NA, K, CL, GLUCOSE, BUN, CREATININE, CALCIUM in the last 72 hours.  Invalid input(s): CO CBG (last 3)   Recent Labs  01/27/16 2101 01/28/16 0630 01/28/16 0704  GLUCAP 184* 50* 105*    Wt Readings from Last 3 Encounters:  01/28/16 78.8 kg (173 lb 12.8 oz)  01/20/16 78.7 kg (173 lb 9.6 oz)  12/30/15 84.4 kg (186 lb)    Physical Exam:  Constitutional: He appears well-developed. No distress HENT:  Head: Normocephalic.  Eyes: EOM are normal.  Neck: Normal range of motion. Neck supple. No tracheal deviation present. No thyromegaly present.  Cardiovascular: RRR.  Respiratory:  CTA B  GI: Soft. Bowel sounds are normal. He exhibits no distension. There is no tenderness.  Musculoskeletal:  Right bka with decreased edema. Left TMA intact. No tenderness around the BK distal aspect  Neurological: Alert Follows full commands. Bilateral UE 5/5. RLE: HF 3-4/5. KE limited by pain. LLE grossly 4-5/5. Senses pain and gross touch in left foot/leg  Skin: Callus left lateral foot stable. Right BK incision with scab and small areas of serosanguinous drainage, no erythema, Psych: very flat/stoic  Assessment/Plan: 1. Functional deficits secondary to right BKA  Stable for D/C today F/u PCP in 3-4 weeks F/u PM&R 2 weeks See D/C summary See D/C instructions Function:  Bathing Bathing position   Position: Shower  Bathing parts Body parts bathed by  patient: Right arm, Left arm, Chest, Abdomen, Front perineal area, Buttocks, Right upper leg, Left upper leg, Left lower leg, Back Body parts bathed by helper: Back, Buttocks  Bathing assist Assist Level: More than reasonable time      Upper Body Dressing/Undressing Upper body dressing   What is the patient wearing?: Pull over shirt/dress     Pull over shirt/dress - Perfomed by patient: Thread/unthread right sleeve, Thread/unthread left sleeve, Put head through opening, Pull shirt over trunk          Upper body assist Assist Level: No help, No cues   Set up : To obtain clothing/put away  Lower Body Dressing/Undressing Lower body dressing   What is the patient wearing?: Underwear, Pants, Non-skid slipper socks Underwear - Performed by patient: Thread/unthread right underwear leg, Thread/unthread left underwear leg, Pull underwear up/down Underwear - Performed by helper: Pull underwear up/down Pants- Performed by patient: Thread/unthread right pants leg, Thread/unthread left pants leg, Pull pants up/down Pants- Performed by helper: Pull pants up/down Non-skid slipper socks- Performed by patient: Don/doff left sock (R N/A)   Socks - Performed by patient: Don/doff left sock   Shoes - Performed by patient: Don/doff left shoe            Lower body assist Assist for lower body dressing: Supervision or verbal cues      Toileting Toileting Toileting activity did not occur: No continent bowel/bladder event Toileting steps completed by patient: Adjust clothing prior to toileting, Performs perineal hygiene, Adjust clothing after toileting   Toileting  Assistive Devices: Grab bar or rail  Toileting assist Assist level: Set up/obtain supplies   Transfers Chair/bed transfer   Chair/bed transfer method: Squat pivot, Stand pivot Chair/bed transfer assist level: No Help, no cues, assistive device, takes more than a reasonable amount of time Chair/bed transfer assistive device: Walker,  Designer, fashion/clothing     Max distance: 200 Assist level: No help, No cues, assistive device, takes more than a reasonable amount of time   Wheelchair   Type: Manual Max wheelchair distance: 300 Assist Level: No help, No cues, assistive device, takes more than reasonable amount of time  Cognition Comprehension Comprehension assist level: Follows complex conversation/direction with extra time/assistive device  Expression Expression assist level: Expresses complex ideas: With extra time/assistive device  Social Interaction Social Interaction assist level: Interacts appropriately with others with medication or extra time (anti-anxiety, antidepressant).  Problem Solving Problem solving assist level: Solves complex 90% of the time/cues < 10% of the time  Memory Memory assist level: Recognizes or recalls 90% of the time/requires cueing < 10% of the time   Medical Problem List and Plan:  1. 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  -continue therapies. ELOS 1/6 -Patient to see me in the office for transitional care encounter in 1-2 weeks.  2. DVT Prophylaxis/Anticoagulation: Subcutaneous Lovenox. Monitor platelet counts and any signs of bleeding  3. Pain Management: Hydrocodone as needed with control 4. Acute blood loss anemia. hgb up to 11  -no signs of substantial blood loss.  5. Neuropsych: This patient is capable of making decisions on his own behalf.   -team to provide ego support as needed---remains very flat 6. Skin/Wound Care: can dc xeroform, but still needs 4x4's, kerlix, and ACE as wound still with slight drainage   -can switch to shrinker as outpt 7. Fluids/Electrolytes/Nutrition: pt with good po intake 8. Diabetes mellitus with peripheral neuropathy. Hemoglobin A1c 8.2. NovoLog 4 units 3 times a day with meals, Lantus insulin currently at 10mg  qhs. But cbg's running low  especially in am  -decreased hs lantus to 15 units, last night. Patient has been on higher insulin dose at home. Likely is not a strict with diet at home. Will discontinue sliding scale insulin. Has been getting an additional 3-11 units per day from the sliding scale. Follow up PCP as outpatient  -encouraged HS snack 9. Constipation. Laxative assistance  10. ID: pt continues on Ancef --through today   LOS (Days) 8 A FACE TO FACE EVALUATION WAS PERFORMED  Erick Colace, MD 01/28/2016 11:17 AM

## 2016-01-28 NOTE — Progress Notes (Signed)
Hypoglycemic Event  CBG: 50  Treatment: 15 GM carbohydrate snack  Symptoms: None  Follow-up CBG: Time:0705 CBG Result:108  Possible Reasons for Event: Unknown  Comments/MD notified:Pt asymptomatic, cont to monitor    Gordon Walker, Gordon Walker

## 2016-01-30 ENCOUNTER — Telehealth (INDEPENDENT_AMBULATORY_CARE_PROVIDER_SITE_OTHER): Payer: Self-pay | Admitting: *Deleted

## 2016-01-30 NOTE — Telephone Encounter (Signed)
Gordon Walker calling for verbal wound care to incision for right BKA zeroform with curlex and ace bandage, 3x this week 2x for 3 weeks 1x 2 weeks with 3 PRN visits.

## 2016-01-30 NOTE — Progress Notes (Signed)
Social Work  Discharge Note  The overall goal for the admission was met for:   Discharge location: Yes - home with wife and family to assist as needed  Length of Stay: Yes - 8 days  Discharge activity level: Yes - mod independent  Home/community participation: Yes  Services provided included: MD, RD, PT, OT, RN, TR, Pharmacy and SW  Financial Services: Private Insurance: [employer] of [place]  Follow-up services arranged: Home Health: Therapist, sports, PT via [facility], DME: 18x18 lighweight w/c with right amputee support pad, cushion, drop arm commode via [place] and Patient/Family has no preference for HH/DME agencies  Comments (or additional information):  Patient/Family verbalized understanding of follow-up arrangements: Yes  Individual responsible for coordination of the follow-up plan: pt  Confirmed correct DME delivered: [facility] 01/27/2016    Ardelia Wrede

## 2016-01-31 ENCOUNTER — Ambulatory Visit (INDEPENDENT_AMBULATORY_CARE_PROVIDER_SITE_OTHER): Payer: BLUE CROSS/BLUE SHIELD | Admitting: Orthopaedic Surgery

## 2016-01-31 ENCOUNTER — Encounter (INDEPENDENT_AMBULATORY_CARE_PROVIDER_SITE_OTHER): Payer: Self-pay | Admitting: Orthopaedic Surgery

## 2016-01-31 VITALS — BP 151/85 | HR 90 | Ht 72.0 in | Wt 165.0 lb

## 2016-01-31 DIAGNOSIS — Z89511 Acquired absence of right leg below knee: Secondary | ICD-10-CM

## 2016-01-31 NOTE — Telephone Encounter (Signed)
Ok for verbal order for wound care 

## 2016-01-31 NOTE — Telephone Encounter (Signed)
Needs ROV not HH dressing changes. Has not been back in since surgery. ucall thanks

## 2016-01-31 NOTE — Telephone Encounter (Signed)
Patient has follow up appt today in office.

## 2016-01-31 NOTE — Progress Notes (Signed)
Post-Op Visit Note   Patient: Gordon Walker           Date of Birth: 02/12/1965           MRN: [id number] Visit Date: 01/31/2016 PCP: Darrow Bussing, MD   Assessment & Plan:  Chief Complaint:  Chief Complaint  Patient presents with  . Right Leg - Routine Post Op   Visit Diagnoses:  1. Status post below knee amputation of right lower extremity (HCC)     Plan: Patient's preferences for [employer] prosthesis. He has swelling early blister formation and some serosanguineous drainage from his stump. He needs to do more elevation keep the stump above his heart I discussed the knee doesn't need to be doing therapy at this point just keep his stump elevated above his heart he can work on bending and straightening his knee some While he is in the spine position with his leg elevated. I'll recheck him in 2 weeks. He will call if he develops any fever or any purulent drainage. Follow-Up Instructions: Return in about 2 weeks (around 02/14/2016).   Orders:  No orders of the defined types were placed in this encounter.  No orders of the defined types were placed in this encounter.  HPI Patient returns for follow up status post right BKA on 01/18/2016.  He is one week and six days post op. He states that he is doing well. He takes Hydrocodone prior to exercising and takes antibiotic 4 times a day. Staples intact. Incision looks good.  PMFS History: Patient Active Problem List   Diagnosis Date Noted  . Acute blood loss anemia 01/20/2016  . Amputation of right lower extremity below knee (HCC) 01/20/2016  . Hyponatremia 01/17/2016  . AKI (acute kidney injury) (HCC) 01/17/2016  . Hyperbilirubinemia 01/17/2016  . Bacteremia due to Escherichia coli 01/17/2016  . Sepsis (HCC) 01/17/2016  . DKA (diabetic ketoacidoses) (HCC) 01/15/2016  . Gas gangrene of foot (HCC)   . Foot amputation status, right (HCC) 01/05/2016  . Ulcer of toe of right foot, with necrosis of bone (HCC) 12/27/2015  . Status  post transmetatarsal amputation of foot, left (HCC) 11/24/2015  . Controlled diabetes mellitus with diabetic peripheral angiopathy and gangrene (HCC)   . 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 Transaminitis 01/07/2015  . Osteomyelitis of left foot (HCC) 01/06/2015  . Uncontrolled diabetes mellitus with foot ulcer, with long-term current use of insulin (HCC) 01/06/2015  . Anemia 01/06/2015  . Osteomyelitis (HCC) 01/05/2015   Past Medical History:  Diagnosis Date  . Diabetes mellitus without complication (HCC)    Type 1  . Heart murmur    "years ago"  . Hypertension    "years ago"  . 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, with long-term current use of insulin (HCC)     Family History  Problem Relation Age of Onset  . Hypertension Mother   . Diabetes Mother   . Cancer Mother   . Prostate cancer Father     Past Surgical History:  Procedure Laterality Date  . AMPUTATION Left 01/07/2015   Procedure: Left Foot 1st Ray Amputation;  Surgeon: Nadara Mustard, MD;  Location: [facility] OR;  Service: Orthopedics;  Laterality: Left;  . AMPUTATION Left 07/15/2015   Procedure: Left Transmetatarsal Amputation;  Surgeon: Nadara Mustard, MD;  [facility];  Service: Orthopedics;  Laterality: Left;  . AMPUTATION Right 12/30/2015   Procedure:  5th Ray Amputation Right Foot;  Surgeon: Nadara MustardMarcus V Duda, MD;  [facility];  Service: Orthopedics;  Laterality: Right;  . AMPUTATION Right 01/15/2016   Procedure: GUILLOTINE AMPUTATION  ABOVE THE ANKLE AMPUTATION;  Surgeon: Eldred MangesMark C Yates, MD;  Location: [facility] OR;  Service: Orthopedics;  Laterality: Right;  . AMPUTATION Right 01/18/2016   Procedure: AMPUTATION BELOW KNEE;  Surgeon: Eldred MangesMark C Yates, MD;  Location: [facility] OR;  Service: Orthopedics;  Laterality: Right;   Social History   Occupational History  . Not on file.   Social History Main Topics  .  Smoking status: Never Smoker  . Smokeless tobacco: Never Used  . Alcohol use No  . Drug use: No  . Sexual activity: Not on file

## 2016-02-01 ENCOUNTER — Encounter (INDEPENDENT_AMBULATORY_CARE_PROVIDER_SITE_OTHER): Payer: Self-pay | Admitting: Family

## 2016-02-01 ENCOUNTER — Ambulatory Visit (INDEPENDENT_AMBULATORY_CARE_PROVIDER_SITE_OTHER): Payer: BLUE CROSS/BLUE SHIELD | Admitting: Family

## 2016-02-01 ENCOUNTER — Telehealth (INDEPENDENT_AMBULATORY_CARE_PROVIDER_SITE_OTHER): Payer: Self-pay | Admitting: Radiology

## 2016-02-01 VITALS — Ht 72.0 in | Wt 165.0 lb

## 2016-02-01 DIAGNOSIS — Z89511 Acquired absence of right leg below knee: Secondary | ICD-10-CM

## 2016-02-01 DIAGNOSIS — S88111A Complete traumatic amputation at level between knee and ankle, right lower leg, initial encounter: Secondary | ICD-10-CM

## 2016-02-01 NOTE — Telephone Encounter (Signed)
Patient's wife needed to schedule follow up appt on 1/25 with Dr. Ophelia CharterYates. I advised that he is in [place] office, but that they are more than welcome to see him there. They opted for 11am on 1/24.

## 2016-02-01 NOTE — Telephone Encounter (Signed)
Can you put patient on 02/15/16 at 11:00am on [facility] schedule for follow up s/p bka? Thanks.

## 2016-02-01 NOTE — Progress Notes (Signed)
Office Visit Note   Patient: Gordon Walker           Date of Birth: 09-Mar-1965           MRN: [id number] Visit Date: 02/01/2016              Requested by: Darrow Bussingibas Koirala, MD [address] [place], [place] [address] PCP: Darrow BussingKOIRALA,DIBAS, MD  Chief Complaint  Patient presents with  . Right Knee - Wound Check    R BKA 01/18/16    HPI: Patient presents in office today as a work in for a concern of increased drainage from right transtibial amputation. Surgery was done by Dr. Ophelia CharterYates on 01/18/16. Patient is 2 weeks post op. He was seen in the office yesterday by Dr. Ophelia CharterYates. He is status post fall this morning. He fell on bottom did not land on his residual limb. There is some bleeding from medial and central incision. Staples are intact. He has wrinkling of the skin showing some decrease in swelling. He is currently on oral antbiotics.       Assessment & Plan: Visit Diagnoses:  1. Amputation of right lower extremity below knee (HCC)     Plan: Continue oral antibiotics. Will continue elevating. Dry dressing and ace wrap applied. Will continue with home health dressing changes. Follow up in office as scheduled.  Follow-Up Instructions: No Follow-up on file.   Ortho Exam right below the knee amputation incision is proximate with staples. Him is beginning to have the little ischemic changes along the incision line. Centrally there is a 1 cm length with impending dehiscence. However has Minimal swelling to the residual limb. Has bloody drainage laterally and centrally. No odor no purulence no surrounding erythema or sign of infection.  Imaging: No results found.  Orders:  No orders of the defined types were placed in this encounter.  No orders of the defined types were placed in this encounter.    Procedures: No procedures performed  Clinical Data: No additional findings.  Subjective: Review of Systems  Constitutional: Negative for chills and fever.     Objective: Vital Signs: Ht 6' (1.829 m)   Wt 165 lb (74.8 kg)   BMI 22.38 kg/m   Specialty Comments:  No specialty comments available.  PMFS History: Patient Active Problem List   Diagnosis Date Noted  . Acute blood loss anemia 01/20/2016  . Amputation of right lower extremity below knee (HCC) 01/20/2016  . Hyponatremia 01/17/2016  . AKI (acute kidney injury) (HCC) 01/17/2016  . Hyperbilirubinemia 01/17/2016  . Bacteremia due to Escherichia coli 01/17/2016  . Sepsis (HCC) 01/17/2016  . DKA (diabetic ketoacidoses) (HCC) 01/15/2016  . Gas gangrene of foot (HCC)   . Foot amputation status, right (HCC) 01/05/2016  . Ulcer of toe of right foot, with necrosis of bone (HCC) 12/27/2015  . Status post transmetatarsal amputation of foot, left (HCC) 11/24/2015  . Controlled diabetes mellitus with diabetic peripheral angiopathy and gangrene (HCC)   . 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 Transaminitis 01/07/2015  . Osteomyelitis of left foot (HCC) 01/06/2015  . Uncontrolled diabetes mellitus with foot ulcer, with long-term current use of insulin (HCC) 01/06/2015  . Anemia 01/06/2015  . Osteomyelitis (HCC) 01/05/2015   Past Medical History:  Diagnosis Date  . Diabetes mellitus without complication (HCC)    Type 1  . Heart murmur    "years ago"  . Hypertension    "  years ago"  . 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, with long-term current use of insulin (HCC)     Family History  Problem Relation Age of Onset  . Hypertension Mother   . Diabetes Mother   . Cancer Mother   . Prostate cancer Father     Past Surgical History:  Procedure Laterality Date  . AMPUTATION Left 01/07/2015   Procedure: Left Foot 1st Ray Amputation;  Surgeon: Nadara Mustard, MD;  Location[facility];  Service: Orthopedics;  Laterality: Left;  . AMPUTATION Left 07/15/2015   Procedure: Left  Transmetatarsal Amputation;  Surgeon: Nadara Mustard, MD;  [facility] OR;  Service: Orthopedics;  Laterality: Left;  . AMPUTATION Right 12/30/2015   Procedure: 5th Ray Amputation Right Foot;  Surgeon: Nadara Mustard, MD;  Location: [facility] OR;  Service: Orthopedics;  Laterality: Right;  . AMPUTATION Right 01/15/2016   Procedure: GUILLOTINE AMPUTATION  ABOVE THE ANKLE AMPUTATION;  Surgeon: Eldred Manges, MD;  Location: [facility] OR;  Service: Orthopedics;  Laterality: Right;  . AMPUTATION Right 01/18/2016   Procedure: AMPUTATION BELOW KNEE;  Surgeon: Eldred Manges, MD;  Location: [facility] OR;  Service: Orthopedics;  Laterality: Right;   Social History   Occupational History  . Not on file.   Social History Main Topics  . Smoking status: Never Smoker  . Smokeless tobacco: Never Used  . Alcohol use No  . Drug use: No  . Sexual activity: Not on file

## 2016-02-03 ENCOUNTER — Telehealth (INDEPENDENT_AMBULATORY_CARE_PROVIDER_SITE_OTHER): Payer: Self-pay | Admitting: Orthopaedic Surgery

## 2016-02-03 NOTE — Telephone Encounter (Signed)
Please see below.

## 2016-02-06 ENCOUNTER — Telehealth (INDEPENDENT_AMBULATORY_CARE_PROVIDER_SITE_OTHER): Payer: Self-pay

## 2016-02-06 MED ORDER — CEPHALEXIN 500 MG PO CAPS: 500.0000 mg | ORAL_CAPSULE | Freq: Four times a day (QID) | ORAL | 0 refills | Status: DC

## 2016-02-06 NOTE — Addendum Note (Signed)
Addended by: Rogers SeedsYEATTS, Carols Clemence M on: 02/06/2016 05:51 PM   Modules accepted: Orders

## 2016-02-06 NOTE — Telephone Encounter (Signed)
I called and spoke with patient's wife. Sent in script for cephalexin 500mg  1po qid #28. I advised pts wife per Dr. Ophelia CharterYates, he feels that the blisters that patient has are from the swelling and wants the patient to continue to elevate. Per patient's wife, she will call if they have any concerns.

## 2016-02-06 NOTE — Telephone Encounter (Signed)
Please advise. Ok for refill?

## 2016-02-06 NOTE — Telephone Encounter (Signed)
Patient is S/P below knee amp (right)-01/18/16. He was seen by Denny PeonErin on 02/01/16 as a workin and states he still has blisters  and needs a RF on his Abx.  Please CB at [phone]

## 2016-02-06 NOTE — Telephone Encounter (Signed)
Ok refill thanks 

## 2016-02-13 ENCOUNTER — Telehealth (INDEPENDENT_AMBULATORY_CARE_PROVIDER_SITE_OTHER): Payer: Self-pay | Admitting: Orthopaedic Surgery

## 2016-02-13 ENCOUNTER — Encounter: Payer: BLUE CROSS/BLUE SHIELD | Admitting: Physical Medicine & Rehabilitation

## 2016-02-13 NOTE — Telephone Encounter (Signed)
I called patient. I spoke with Gordon Walker and gave her all of the information. I apologized that there had been some confusion as to returning her call. I did tell Gordon Walker 8-12 weeks for disability, but will see if Dr. Ophelia CharterYates can comment on that in next office note (1/24) so that we can get her that info in documentation.

## 2016-02-13 NOTE — Telephone Encounter (Signed)
Patient called advised he just spoke with someone from [place] and was told no one has called his nurse case manager back. The number to Corrie DandyMary is [phone]. Patient asked for a call back as soon as possible after information is faxed back to her. The number to contact patient is [phone]

## 2016-02-15 ENCOUNTER — Encounter (INDEPENDENT_AMBULATORY_CARE_PROVIDER_SITE_OTHER): Payer: Self-pay | Admitting: Orthopaedic Surgery

## 2016-02-15 ENCOUNTER — Ambulatory Visit (INDEPENDENT_AMBULATORY_CARE_PROVIDER_SITE_OTHER): Payer: BLUE CROSS/BLUE SHIELD | Admitting: Orthopaedic Surgery

## 2016-02-15 VITALS — BP 108/74 | HR 99

## 2016-02-15 DIAGNOSIS — Z89511 Acquired absence of right leg below knee: Secondary | ICD-10-CM

## 2016-02-15 NOTE — Progress Notes (Signed)
Office Visit Note   Patient: Gordon Walker           Date of Birth: October 20, 1965           MRN: [id number] Visit Date: 02/15/2016              Requested by: Darrow Bussing, MD [address] [place], [place] [address] PCP: Darrow Bussing, MD   Assessment & Plan: Visit Diagnoses:  1. Status post below knee amputation of right lower extremity (HCC)     Plan: Return 2 weeks for wound check. He has some edge necrosis and is still having some serous drainage. As the drainage decreases C could begin to use a stump shrinker sock.  Follow-Up Instructions: Return in about 2 weeks (around 02/29/2016).   Orders:  No orders of the defined types were placed in this encounter.  No orders of the defined types were placed in this encounter.     Procedures: No procedures performed   Clinical Data: No additional findings.   Subjective: Chief Complaint  Patient presents with  . Right Knee - Routine Post Op, Follow-up    Patient comes in today s/p R BKA (01/18/16). He has no pain. He has some bleeding/drainage. He states he changes dressing every other day. Ambulates with walker. He is out of antibiotics and needs a refill. Staples intact. Overall doing okay. Needs updated work note.     Review of Systems updated unchanged from last visit   Objective: Vital Signs: BP 108/74   Pulse 99   Physical Exam she has some serosanguineous drainage. No further blistering over the posterior flap. There is some 1-2 mm edge necrosis tissue which is white. Eschar laterally. Swelling is decreased due to his keeping a stump elevated.  Ortho Exam  Specialty Comments:  No specialty comments available.  Imaging: No results found.   PMFS History: Patient Active Problem List   Diagnosis Date Noted  . Acute blood loss anemia 01/20/2016  . Status post below knee amputation of right lower extremity (HCC) 01/20/2016  . Hyponatremia 01/17/2016  . AKI (acute kidney injury) (HCC)  01/17/2016  . Hyperbilirubinemia 01/17/2016  . Bacteremia due to Escherichia coli 01/17/2016  . Sepsis (HCC) 01/17/2016  . DKA (diabetic ketoacidoses) (HCC) 01/15/2016  . Gas gangrene of foot (HCC)   . Foot amputation status, right (HCC) 01/05/2016  . Ulcer of toe of right foot, with necrosis of bone (HCC) 12/27/2015  . Status post transmetatarsal amputation of foot, left (HCC) 11/24/2015  . Controlled diabetes mellitus with diabetic peripheral angiopathy and gangrene (HCC)   . 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 Transaminitis 01/07/2015  . Osteomyelitis of left foot (HCC) 01/06/2015  . Uncontrolled diabetes mellitus with foot ulcer, with long-term current use of insulin (HCC) 01/06/2015  . Anemia 01/06/2015  . Osteomyelitis (HCC) 01/05/2015   Past Medical History:  Diagnosis Date  . Diabetes mellitus without complication (HCC)    Type 1  . Heart murmur    "years ago"  . Hypertension    "years ago"  . 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, with long-term current use of insulin (HCC)     Family History  Problem Relation Age of Onset  . Hypertension Mother   . Diabetes Mother   . Cancer Mother   . Prostate cancer Father     Past Surgical History:  Procedure Laterality Date  .  AMPUTATION Left 01/07/2015   Procedure: Left Foot 1st Ray Amputation;  Surgeon: Nadara MustardMarcus Duda V, MD;  [facility];  Service: Orthopedics;  Laterality: Left;  . AMPUTATION Left 07/15/2015   Procedure: Left Transmetatarsal Amputation;  Surgeon: Nadara MustardMarcus V Duda, MD;  Location: [facility] OR;  Service: Orthopedics;  Laterality: Left;  . AMPUTATION Right 12/30/2015   Procedure: 5th Ray Amputation Right Foot;  Surgeon: Nadara MustardMarcus V Duda, MD;  [facility];  Service: Orthopedics;  Laterality: Right;  . AMPUTATION Right 01/15/2016   Procedure: GUILLOTINE AMPUTATION  ABOVE THE ANKLE AMPUTATION;   Surgeon: Eldred MangesMark C Yates, MD;  Location: [facility] OR;  Service: Orthopedics;  Laterality: Right;  . AMPUTATION Right 01/18/2016   Procedure: AMPUTATION BELOW KNEE;  Surgeon: Eldred MangesMark C Yates, MD;  Location: [facility] OR;  Service: Orthopedics;  Laterality: Right;   Social History   Occupational History  . Not on file.   Social History Main Topics  . Smoking status: Never Smoker  . Smokeless tobacco: Never Used  . Alcohol use No  . Drug use: No  . Sexual activity: Not on file

## 2016-02-16 ENCOUNTER — Telehealth (INDEPENDENT_AMBULATORY_CARE_PROVIDER_SITE_OTHER): Payer: Self-pay

## 2016-02-16 NOTE — Telephone Encounter (Signed)
I called patient and advised. 

## 2016-02-16 NOTE — Telephone Encounter (Signed)
I attempted to call patient to advise. I got a message that states the wireless customer is not available and to try back later.

## 2016-02-16 NOTE — Telephone Encounter (Signed)
Please advise 

## 2016-02-16 NOTE — Telephone Encounter (Signed)
Pt called and wants to know if he should continue with his antibiotic? If so he will need a refill. Uses the [facility] on [address].

## 2016-02-16 NOTE — Telephone Encounter (Signed)
OK to stop ABX now . ucall thanks

## 2016-02-23 ENCOUNTER — Ambulatory Visit (INDEPENDENT_AMBULATORY_CARE_PROVIDER_SITE_OTHER): Payer: BLUE CROSS/BLUE SHIELD | Admitting: Family

## 2016-02-23 DIAGNOSIS — Z89511 Acquired absence of right leg below knee: Secondary | ICD-10-CM

## 2016-02-23 NOTE — Progress Notes (Signed)
Office Visit Note   Patient: Gordon Walker           Date of Birth: 06-19-1965           MRN: [id number] Visit Date: 02/23/2016              Requested by: Darrow Bussing, MD [address] [place], [place] [address] PCP: Darrow Bussing, MD  Chief Complaint  Patient presents with  . Right Leg - Wound Check, Routine Post Op    HPI: The patient is a 51 year old woman seen today as a work in for a follow-up. He is 4 weeks status post transtibial amputation on the right. He sustained a fall yesterday landing directly on his amputation. Has had increased bloody drainage and was concerned for wound dehiscence.    Assessment & Plan: Visit Diagnoses:  1. Status post below knee amputation of right lower extremity (HCC)     Plan: continue with daily dry dressing changes following wound cleansing. Continue his shrinker. Will follow up with Dr. Ophelia Charter as scheduled.  Follow-Up Instructions: Return for as scheduled.   Ortho Exam right transtibial amputation is well approximated with staples. There is exudative tissue along the entire length of the incision with ischemic changes. There is no gaping of the incision. No dehiscence. Does have bloody drainage centrally. No purulence odor surrounding erythema or sign of infection. Imaging: No results found.  Orders:  No orders of the defined types were placed in this encounter.  No orders of the defined types were placed in this encounter.    Procedures: No procedures performed  Clinical Data: No additional findings.  Subjective: Review of Systems  Constitutional: Negative for chills and fever.    Objective: Vital Signs: There were no vitals taken for this visit.  Specialty Comments:  No specialty comments available.  PMFS History: Patient Active Problem List   Diagnosis Date Noted  . Acute blood loss anemia 01/20/2016  . Status post below knee amputation of right lower extremity (HCC) 01/20/2016  .  Hyponatremia 01/17/2016  . AKI (acute kidney injury) (HCC) 01/17/2016  . Hyperbilirubinemia 01/17/2016  . Bacteremia due to Escherichia coli 01/17/2016  . Sepsis (HCC) 01/17/2016  . DKA (diabetic ketoacidoses) (HCC) 01/15/2016  . Gas gangrene of foot (HCC)   . Foot amputation status, right (HCC) 01/05/2016  . Ulcer of toe of right foot, with necrosis of bone (HCC) 12/27/2015  . Controlled diabetes mellitus with diabetic peripheral angiopathy and gangrene (HCC)   . 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 Transaminitis 01/07/2015  . Osteomyelitis of left foot (HCC) 01/06/2015  . Uncontrolled diabetes mellitus with foot ulcer, with long-term current use of insulin (HCC) 01/06/2015  . Anemia 01/06/2015  . Osteomyelitis (HCC) 01/05/2015   Past Medical History:  Diagnosis Date  . Diabetes mellitus without complication (HCC)    Type 1  . Heart murmur    "years ago"  . Hypertension    "years ago"  . 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, with long-term current use of insulin (HCC)     Family History  Problem Relation Age of Onset  . Hypertension Mother   . Diabetes Mother   . Cancer Mother   . Prostate cancer Father     Past Surgical History:  Procedure Laterality Date  . AMPUTATION Left 01/07/2015   Procedure: Left Foot 1st Ray Amputation;  Surgeon: Berna Spare  Kandis Mannanuda V, MD;  [facility];  Service: Orthopedics;  Laterality: Left;  . AMPUTATION Left 07/15/2015   Procedure: Left Transmetatarsal Amputation;  Surgeon: Nadara MustardMarcus V Duda, MD;  [facility]: [facility];  Service: Orthopedics;  Laterality: Left;  . AMPUTATION Right 12/30/2015   Procedure: 5th Ray Amputation Right Foot;  Surgeon: Nadara MustardMarcus V Duda, MD;  [facility];  Service: Orthopedics;  Laterality: Right;  . AMPUTATION Right 01/15/2016   Procedure: GUILLOTINE AMPUTATION  ABOVE THE ANKLE AMPUTATION;  Surgeon: Eldred MangesMark C  Yates, MD;  Location: [facility] OR;  Service: Orthopedics;  Laterality: Right;  . AMPUTATION Right 01/18/2016   Procedure: AMPUTATION BELOW KNEE;  Surgeon: Eldred MangesMark C Yates, MD;  [facility]: [facility] OR;  Service: Orthopedics;  Laterality: Right;   Social History   Occupational History  . Not on file.   Social History Main Topics  . Smoking status: Never Smoker  . Smokeless tobacco: Never Used  . Alcohol use No  . Drug use: No  . Sexual activity: Not on file

## 2016-02-27 ENCOUNTER — Telehealth (INDEPENDENT_AMBULATORY_CARE_PROVIDER_SITE_OTHER): Payer: Self-pay

## 2016-02-27 NOTE — Telephone Encounter (Signed)
[facility] Office [facility] Experience and requested a call for Dr Ophelia CharterYates and Dr Lajoyce Cornersuda.  Please read the detail e-mail sent to you and let me know how you want to contact this patient.  Via office visit or phone. The patient just wants "clarity from the doctors' perspectives"

## 2016-02-27 NOTE — Telephone Encounter (Signed)
I called patient and we had a long discussion as we have when he was in the hospital concerning his type 1 diabetes started age 51. 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 Recently his A1c's have been better. After the toe surgery by Dr. Lajoyce Cornersuda he developed gas gangrene in his foot and was septic, high white blood cell count with gas in the soft tissue climbing up his leg to the ankle and had to have an emergent surgery with an amputation just above the ankle. This was a guillotine amputation and then we brought him back a few days later revised at the normal level for below-knee amputation. We discussed problems with neuropathy that occur on the sugars run high, microcirculatory damage that occurs, retinal damage that occurs, renal disease etc. all related to his type 1 diabetes. Fortunately as he got older he is paid more close attention to his diabetes is done a better job testing his sugars, a better job adjusting his insulin and his A1c has gotten better. His stump is healing he had a recent fall and the [employer] prosthetic Rep  made him a stump protector in case he has another fall to protect the stump to prevent dehiscence. Patient has appointment with me tomorrow in the office. He expressed understanding that was unfortunate that he had complications related to his diabetes, he understands that better diabetic control is his best way to prevent further complications related to diabetes. Patient states that he is satisfied and happy with the care that he has received. He stated that he understands now how important it is to future health that he continued to improve his diabetic control. I discussed with him again that when he develops gas gangrene and it begins to ascend up his leg that if emergent surgery is not performed then he  would die from sepsis and infection with multi-organ system failure. The discussion above was held with the patient before he had his guillotine amputation, after the amputation and before the revision surgery as well as after the revision surgery. Patient has appointment tomorrow for follow-up and once again we will have a discussion about the importance of diabetic monitoring, goal of improved A1c, and trying to minimize his chances of having further complications from his type 1 diabetes.

## 2016-02-28 ENCOUNTER — Encounter (INDEPENDENT_AMBULATORY_CARE_PROVIDER_SITE_OTHER): Payer: Self-pay | Admitting: Orthopaedic Surgery

## 2016-02-28 ENCOUNTER — Ambulatory Visit (INDEPENDENT_AMBULATORY_CARE_PROVIDER_SITE_OTHER): Payer: BLUE CROSS/BLUE SHIELD | Admitting: Endocrinology

## 2016-02-28 ENCOUNTER — Ambulatory Visit (INDEPENDENT_AMBULATORY_CARE_PROVIDER_SITE_OTHER): Payer: BLUE CROSS/BLUE SHIELD | Admitting: Orthopaedic Surgery

## 2016-02-28 ENCOUNTER — Encounter: Payer: Self-pay | Admitting: Endocrinology

## 2016-02-28 VITALS — BP 128/82 | HR 101 | Ht 72.0 in | Wt 160.0 lb

## 2016-02-28 VITALS — BP 143/85 | HR 97 | Ht 72.0 in | Wt 160.0 lb

## 2016-02-28 DIAGNOSIS — E1052 Type 1 diabetes mellitus with diabetic peripheral angiopathy with gangrene: Secondary | ICD-10-CM | POA: Diagnosis not present

## 2016-02-28 DIAGNOSIS — Z89511 Acquired absence of right leg below knee: Secondary | ICD-10-CM

## 2016-02-28 LAB — POCT GLYCOSYLATED HEMOGLOBIN (HGB A1C): HEMOGLOBIN A1C: 6.7

## 2016-02-28 MED ORDER — INSULIN ASPART 100 UNIT/ML FLEXPEN: 7.0000 [IU] | PEN_INJECTOR | Freq: Three times a day (TID) | SUBCUTANEOUS | 11 refills | Status: DC

## 2016-02-28 NOTE — Progress Notes (Signed)
Office Visit Note   Patient: Gordon Walker           Date of Birth: 25-Oct-1965           MRN: [id number] Visit Date: 02/28/2016              Requested by: Darrow Bussing, MD [address] [place], [place] [address] PCP: Darrow Bussing, MD   Assessment & Plan: Visit Diagnoses:  1. Status post below knee amputation of right lower extremity (HCC)     Plan: We will recheck him in 3 weeks. Staples are harvested. Steri-Strips applied. His last A1c was 6.7 and his control is significantly improved which should help his healing process. Once the stump is in good shape then will discuss prosthetic fitting with [employer] has been following him. He has a stump protector that was made afterwards had a second fall. To wear all the time to prevent injury. Recheck 3 weeks   Follow-Up Instructions: Return in about 3 weeks (around 03/20/2016).   Orders:  No orders of the defined types were placed in this encounter.  No orders of the defined types were placed in this encounter.     Procedures: No procedures performed   Clinical Data: No additional findings.   Subjective: Chief Complaint  Patient presents with  . Right Leg - Routine Post Op    Patient is here for a 2 week ROV, Right BKA.  States he is doing good, but fell last week.  Currently wearing a brace and stump shrinker, staples are intact and he has completed his antibiotics.    Review of Systems   Objective: Vital Signs: BP (!) 143/85   Pulse 97   Ht 6' (1.829 m)   Wt 160 lb (72.6 kg)   BMI 21.70 kg/m   Physical Exam  Ortho Exam  Specialty Comments:  No specialty comments available.  Imaging: No results found.   PMFS History: Patient Active Problem List   Diagnosis Date Noted  . Acute blood loss anemia 01/20/2016  . Status post below knee amputation of right lower extremity (HCC) 01/20/2016  . Hyponatremia 01/17/2016  . AKI (acute kidney injury) (HCC) 01/17/2016  .  Hyperbilirubinemia 01/17/2016  . Bacteremia due to Escherichia coli 01/17/2016  . Sepsis (HCC) 01/17/2016  . DKA (diabetic ketoacidoses) (HCC) 01/15/2016  . Gas gangrene of foot (HCC)   . Foot amputation status, right (HCC) 01/05/2016  . Ulcer of toe of right foot, with necrosis of bone (HCC) 12/27/2015  . Controlled diabetes mellitus with diabetic peripheral angiopathy and gangrene (HCC)   . 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 Transaminitis 01/07/2015  . Osteomyelitis of left foot (HCC) 01/06/2015  . Uncontrolled diabetes mellitus with foot ulcer, with long-term current use of insulin (HCC) 01/06/2015  . Anemia 01/06/2015  . Osteomyelitis (HCC) 01/05/2015   Past Medical History:  Diagnosis Date  . Diabetes mellitus without complication (HCC)    Type 1  . Heart murmur    "years ago"  . Hypertension    "years ago"  . 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, with long-term current use of insulin (HCC)     Family History  Problem Relation Age of Onset  . Hypertension Mother   . Diabetes Mother   . Cancer Mother   . Prostate cancer Father     Past Surgical History:  Procedure Laterality Date  .  AMPUTATION Left 01/07/2015   Procedure: Left Foot 1st Ray Amputation;  Surgeon: Nadara MustardMarcus Duda V, MD;  [facility];  Service: Orthopedics;  Laterality: Left;  . AMPUTATION Left 07/15/2015   Procedure: Left Transmetatarsal Amputation;  Surgeon: Nadara MustardMarcus V Duda, MD;  [facility];  Service: Orthopedics;  Laterality: Left;  . AMPUTATION Right 12/30/2015   Procedure: 5th Ray Amputation Right Foot;  Surgeon: Nadara MustardMarcus V Duda, MD;  [facility];  Service: Orthopedics;  Laterality: Right;  . AMPUTATION Right 01/15/2016   Procedure: GUILLOTINE AMPUTATION  ABOVE THE ANKLE AMPUTATION;  Surgeon: Eldred MangesMark C Briggitte Boline, MD;  [facility];  Service: Orthopedics;  Laterality: Right;  .  AMPUTATION Right 01/18/2016   Procedure: AMPUTATION BELOW KNEE;  Surgeon: Eldred MangesMark C Elchonon Maxson, MD;  [facility];  Service: Orthopedics;  Laterality: Right;   Social History   Occupational History  . Not on file.   Social History Main Topics  . Smoking status: Never Smoker  . Smokeless tobacco: Never Used  . Alcohol use No  . Drug use: No  . Sexual activity: Not on file

## 2016-02-28 NOTE — Progress Notes (Signed)
Subjective:    Patient ID: Gordon Walker, male    DOB: Mar 17, 1965, 51 y.o.   MRN: [id number]  HPI Pt returns for f/u of diabetes mellitus: DM type: Insulin-requiring type 2 (but he is presumed to be evolving type 1) Dx'ed: 1998 Complications: polyneuropathy, left foot amputation, nephropathy, and retinopathy. Therapy: insulin since dx DKA: never Severe hypoglycemia: last episode was Sept, 2017 Pancreatitis: never Other: he takes multiple daily injections.   Interval history:  He takes novolog, 6-8 units 3 times a day (just before each meal), and lantus, 15-20 units qhs (both insulins are ss).  no cbg record, but states cbg's vary from 55-200's.  It is in general higher as the day goes on Past Medical History:  Diagnosis Date  . Diabetes mellitus without complication (HCC)    Type 1  . Heart murmur    "years ago"  . Hypertension    "years ago"  . 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, with long-term current use of insulin [facility])     Past Surgical History:  Procedure Laterality Date  . AMPUTATION Left 01/07/2015   Procedure: Left Foot 1st Ray Amputation;  Surgeon: Nadara Mustard, MD;  Location: [facility] OR;  Service: Orthopedics;  Laterality: Left;  . AMPUTATION Left 07/15/2015   Procedure: Left Transmetatarsal Amputation;  Surgeon: Nadara Mustard, MD;  [facility];  Service: Orthopedics;  Laterality: Left;  . AMPUTATION Right 12/30/2015   Procedure: 5th Ray Amputation Right Foot;  Surgeon: Nadara Mustard, MD;  Location: [facility] OR;  Service: Orthopedics;  Laterality: Right;  . AMPUTATION Right 01/15/2016   Procedure: GUILLOTINE AMPUTATION  ABOVE THE ANKLE AMPUTATION;  Surgeon: Eldred Manges, MD;  [facility];  Service: Orthopedics;  Laterality: Right;  . AMPUTATION Right 01/18/2016   Procedure: AMPUTATION BELOW KNEE;  Surgeon: Eldred Manges, MD;  [facility]: [facility];  Service: Orthopedics;  Laterality: Right;    Social History   Social History  .  Marital status: Married    Spouse name: N/A  . Number of children: N/A  . Years of education: N/A   Occupational History  . Not on file.   Social History Main Topics  . Smoking status: Never Smoker  . Smokeless tobacco: Never Used  . Alcohol use No  . Drug use: No  . Sexual activity: Not on file   Other Topics Concern  . Not on file   Social History Narrative  . No narrative on file    Current Outpatient Prescriptions on File Prior to Visit  Medication Sig Dispense Refill  . aspirin EC 81 MG tablet Take 81 mg by mouth daily.    Marland Kitchen atorvastatin (LIPITOR) 20 MG tablet Take 1 tablet (20 mg total) by mouth daily. 30 tablet 0  . ferrous sulfate 325 (65 FE) MG tablet Take 1 tablet (325 mg total) by mouth 2 (two) times daily with a meal. 60 tablet 0  . insulin glargine (LANTUS) 100 UNIT/ML injection Inject 0.15 mLs (15 Units total) into the skin at bedtime. 10 mL 11  . lisinopril (PRINIVIL,ZESTRIL) 10 MG tablet Take 1 tablet (10 mg total) by mouth daily. 30 tablet 0  . Multiple Vitamin (MULTIVITAMIN WITH MINERALS) TABS tablet Take 1 tablet by mouth daily.     No current facility-administered medications on file prior to visit.     Allergies  Allergen Reactions  . No Known Allergies     Family History  Problem Relation Age of Onset  .  Hypertension Mother   . Diabetes Mother   . Cancer Mother   . Prostate cancer Father     BP 128/82   Pulse (!) 101   Ht 6' (1.829 m)   Wt 160 lb (72.6 kg)   SpO2 99%   BMI 21.70 kg/m    Review of Systems He denies hypoglycemia    Objective:   Physical Exam VITAL SIGNS:  See vs page GENERAL: no distress.   Pulses: left dorsalis pedis intact.   MSK:  Left foot transmetatarsal amputation, well-healed. Right BKA CV: trace left leg edema Skin: healed ulcer at the lateral aspect of the left foot.  normal color and temp on the left foot. Neuro: sensation is intact to touch on the left foot, but severely decreased from normal.      A1c=6.7%    Assessment & Plan:  BKA, new.  This has apparently resulted in reduced insulin requirement. Insulin-requiring type 2 DM, with retinopathy: Based on the pattern of her cbg's, she needs some adjustment in her therapy.   Patient is advised the following: Patient Instructions  check your blood sugar twice a day.  vary the time of day when you check, between before the 3 meals, and at bedtime.  also check if you have symptoms of your blood sugar being too high or too low.  please keep a record of the readings and bring it to your next appointment here (or you can bring the meter itself).  You can write it on any piece of paper.  please call us sooner if your blood sugar goes below 70, or if you have a lot of readings over 200. For now, please take: Lantus, 15 units at bedtime, and: novolog, 7 units 3 times a day (just before each meal).  You can take 1 extra unit for a high blood sugar (300), or for a larger than usual meal.   Please come back for a follow-up appointment in 1 month.

## 2016-02-28 NOTE — Patient Instructions (Addendum)
check your blood sugar twice a day.  vary the time of day when you check, between before the 3 meals, and at bedtime.  also check if you have symptoms of your blood sugar being too high or too low.  please keep a record of the readings and bring it to your next appointment here (or you can bring the meter itself).  You can write it on any piece of paper.  please call us sooner if your blood sugar goes below 70, or if you have a lot of readings over 200. For now, please take: Lantus, 15 units at bedtime, and: novolog, 7 units 3 times a day (just before each meal).  You can take 1 extra unit for a high blood sugar (300), or for a larger than usual meal.   Please come back for a follow-up appointment in 1 month.

## 2016-02-29 ENCOUNTER — Encounter: Payer: Self-pay | Admitting: Physical Medicine & Rehabilitation

## 2016-02-29 ENCOUNTER — Encounter
Payer: BLUE CROSS/BLUE SHIELD | Attending: Physical Medicine & Rehabilitation | Admitting: Physical Medicine & Rehabilitation

## 2016-02-29 VITALS — BP 132/81 | HR 90

## 2016-02-29 DIAGNOSIS — I10 Essential (primary) hypertension: Secondary | ICD-10-CM | POA: Insufficient documentation

## 2016-02-29 DIAGNOSIS — Z89511 Acquired absence of right leg below knee: Secondary | ICD-10-CM | POA: Diagnosis not present

## 2016-02-29 DIAGNOSIS — Z833 Family history of diabetes mellitus: Secondary | ICD-10-CM | POA: Diagnosis not present

## 2016-02-29 DIAGNOSIS — Z8249 Family history of ischemic heart disease and other diseases of the circulatory system: Secondary | ICD-10-CM | POA: Insufficient documentation

## 2016-02-29 DIAGNOSIS — E1042 Type 1 diabetes mellitus with diabetic polyneuropathy: Secondary | ICD-10-CM | POA: Insufficient documentation

## 2016-02-29 DIAGNOSIS — Z8042 Family history of malignant neoplasm of prostate: Secondary | ICD-10-CM | POA: Insufficient documentation

## 2016-02-29 NOTE — Patient Instructions (Signed)
PLEASE FEEL FREE TO CALL OUR OFFICE WITH ANY PROBLEMS OR QUESTIONS ([phone])      

## 2016-02-29 NOTE — Progress Notes (Signed)
Subjective:    Patient ID: Gordon Walker, male    DOB: Mar 21, 1965, 51 y.o.   MRN: [id number]  HPI   Gordon Walker is here in follow up of his right BKA. He is not having pain in the stump nor is he having phantom symptoms. He walked into our office today! [employer] is following him for his prosthetic care. Staples are out and he's wearing a shrinker.   He fell twice when his walker "slipped". He states he wasn't doing anything out of the ordinary  He is off all pain medications.    Pain Inventory Average Pain 0 Pain Right Now 0 My pain is na  In the last 24 hours, has pain interfered with the following? General activity 0 Relation with others 0 Enjoyment of life 0 What TIME of day is your pain at its worst? na Sleep (in general) Good  Pain is worse with: na Pain improves with: na Relief from Meds: na  Mobility use a walker ability to climb steps?  yes do you drive?  yes Do you have any goals in this area?  yes  Function employed # of hrs/week 50 Do you have any goals in this area?  yes  Neuro/Psych No problems in this area  Prior Studies Any changes since last visit?  no  Physicians involved in your care Any changes since last visit?  no   Family History  Problem Relation Age of Onset  . Hypertension Mother   . Diabetes Mother   . Cancer Mother   . Prostate cancer Father    Social History   Social History  . Marital status: Married    Spouse name: N/A  . Number of children: N/A  . Years of education: N/A   Social History Main Topics  . Smoking status: Never Smoker  . Smokeless tobacco: Never Used  . Alcohol use No  . Drug use: No  . Sexual activity: Not on file   Other Topics Concern  . Not on file   Social History Narrative  . No narrative on file   Past Surgical History:  Procedure Laterality Date  . AMPUTATION Left 01/07/2015   Procedure: Left Foot 1st Ray Amputation;  Surgeon: Nadara MustardMarcus Duda V, MD;  [facility];  Service:  Orthopedics;  Laterality: Left;  . AMPUTATION Left 07/15/2015   Procedure: Left Transmetatarsal Amputation;  Surgeon: Nadara MustardMarcus V Duda, MD;  [facility];  Service: Orthopedics;  Laterality: Left;  . AMPUTATION Right 12/30/2015   Procedure: 5th Ray Amputation Right Foot;  Surgeon: Nadara MustardMarcus V Duda, MD;  [facility];  Service: Orthopedics;  Laterality: Right;  . AMPUTATION Right 01/15/2016   Procedure: GUILLOTINE AMPUTATION  ABOVE THE ANKLE AMPUTATION;  Surgeon: Eldred MangesMark C Yates, MD;  [facility];  Service: Orthopedics;  Laterality: Right;  . AMPUTATION Right 01/18/2016   Procedure: AMPUTATION BELOW KNEE;  Surgeon: Eldred MangesMark C Yates, MD;  [facility];  Service: Orthopedics;  Laterality: Right;   Past Medical History:  Diagnosis Date  . Diabetes mellitus without complication (HCC)    Type 1  . Heart murmur    "years ago"  . Hypertension    "years ago"  . 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, with long-term current use of insulin (HCC)    There were no vitals taken for this visit.  Opioid Risk Score:   Fall Risk Score:  `1  Depression screen PHQ 2/9  No flowsheet data found. Review of  Systems  Constitutional: Positive for unexpected weight change.  HENT: Negative.   Eyes: Negative.   Respiratory: Negative.   Cardiovascular: Negative.   Gastrointestinal: Negative.   Endocrine: Negative.   Genitourinary: Negative.   Musculoskeletal: Negative.   Skin: Negative.   Allergic/Immunologic: Negative.   Neurological: Negative.   Hematological: Negative.   Psychiatric/Behavioral: Negative.   All other systems reviewed and are negative.      Objective:   Physical Exam  Constitutional: He appears well-developed. No distress HENT:  Head: Normocephalic.  Eyes: EOM are normal.  Neck: Normal range of motion. Neck supple. No tracheal deviation present. No thyromegaly present.  Cardiovascular: RRR  Respiratory:  CTA B  GI: Soft. Bowel sounds are  normal. He exhibits no distension. There is no tenderness.  Musculoskeletal:  Right bka well shaped. Central area with serous drainage. Central area healing with secondary intention. steristrips in place Neurological: Alert Follows full commands. Bilateral UE 5/5. RLE: HF 3-4/5. KE limited by pain. LLE grossly 4-5/5. Senses pain and gross touch in left foot/leg  Skin: left foot callus Psych: remains very flat/stoic      Assessment & Plan:  1. Hx of right BKA  -wound still healing  -probably a few weeks away from beginning the prosthetic process  -continue with HEP,gait  -will be an excellent K3 prosthetic candidate when ready  -will need an outpt PT referral as well. I would be happy to write rx.  2.  Pain Management: Hydrocodone as needed with control 3. Skin/Wound Care: staples out.   -continue dry dressing qd to bid  -shrinker  -wound was re-dressed by myself 4. Diabetes mellitus with peripheral neuropathy. Per primary  Follow up with me PRN. Fifteen minutes of face to face patient care time were spent during this visit. All questions were encouraged and answered.

## 2016-03-02 ENCOUNTER — Telehealth (INDEPENDENT_AMBULATORY_CARE_PROVIDER_SITE_OTHER): Payer: Self-pay | Admitting: Orthopaedic Surgery

## 2016-03-02 NOTE — Telephone Encounter (Signed)
[facility] requesting a fax to them of pt insurance card.  [phone]

## 2016-03-05 DIAGNOSIS — E1051 Type 1 diabetes mellitus with diabetic peripheral angiopathy without gangrene: Secondary | ICD-10-CM | POA: Diagnosis not present

## 2016-03-05 DIAGNOSIS — E1042 Type 1 diabetes mellitus with diabetic polyneuropathy: Secondary | ICD-10-CM | POA: Diagnosis not present

## 2016-03-05 DIAGNOSIS — Z89511 Acquired absence of right leg below knee: Secondary | ICD-10-CM

## 2016-03-05 DIAGNOSIS — D62 Acute posthemorrhagic anemia: Secondary | ICD-10-CM

## 2016-03-05 DIAGNOSIS — I10 Essential (primary) hypertension: Secondary | ICD-10-CM | POA: Diagnosis not present

## 2016-03-05 DIAGNOSIS — Z4781 Encounter for orthopedic aftercare following surgical amputation: Secondary | ICD-10-CM | POA: Diagnosis not present

## 2016-03-05 NOTE — Telephone Encounter (Signed)
Faxed per request.

## 2016-03-06 NOTE — Telephone Encounter (Signed)
[facility] called stating he needs paperwork that was faxed on 2/7 to be completed. Contact number is  [phone] [facility]

## 2016-03-08 NOTE — Telephone Encounter (Signed)
Can you f/u on this one?

## 2016-03-08 NOTE — Telephone Encounter (Signed)
See below

## 2016-03-12 NOTE — Telephone Encounter (Signed)
I have fax. Will have Dr. Ophelia CharterYates sign and complete and fax back tomorrow once done along with office notes.

## 2016-03-12 NOTE — Telephone Encounter (Signed)
I asked them to re-fax this to me so I could do this for you; but I never received. I am sorry

## 2016-03-12 NOTE — Telephone Encounter (Signed)
I called Gordon Walker and asked for her to refax. She has been faxing to [phone]. I asked her to fax to [phone] instead. She would also like office notes for prosthetic care.

## 2016-03-13 ENCOUNTER — Ambulatory Visit (INDEPENDENT_AMBULATORY_CARE_PROVIDER_SITE_OTHER): Payer: BLUE CROSS/BLUE SHIELD | Admitting: Orthopaedic Surgery

## 2016-03-16 NOTE — Telephone Encounter (Signed)
Completed and faxed per request

## 2016-03-20 ENCOUNTER — Ambulatory Visit (INDEPENDENT_AMBULATORY_CARE_PROVIDER_SITE_OTHER): Payer: BLUE CROSS/BLUE SHIELD | Admitting: Orthopaedic Surgery

## 2016-03-20 ENCOUNTER — Encounter (HOSPITAL_COMMUNITY): Payer: Self-pay | Admitting: *Deleted

## 2016-03-20 ENCOUNTER — Other Ambulatory Visit (INDEPENDENT_AMBULATORY_CARE_PROVIDER_SITE_OTHER): Payer: Self-pay | Admitting: Orthopaedic Surgery

## 2016-03-20 ENCOUNTER — Encounter (INDEPENDENT_AMBULATORY_CARE_PROVIDER_SITE_OTHER): Payer: Self-pay | Admitting: Orthopaedic Surgery

## 2016-03-20 VITALS — BP 142/82 | HR 99

## 2016-03-20 DIAGNOSIS — IMO0002 Reserved for concepts with insufficient information to code with codable children: Secondary | ICD-10-CM

## 2016-03-20 DIAGNOSIS — Z89511 Acquired absence of right leg below knee: Secondary | ICD-10-CM

## 2016-03-20 DIAGNOSIS — T8131XD Disruption of external operation (surgical) wound, not elsewhere classified, subsequent encounter: Secondary | ICD-10-CM

## 2016-03-20 NOTE — Progress Notes (Signed)
Office Visit Note   Patient: Gordon Walker           Date of Birth: 18-Nov-1965           MRN: [id number] Visit Date: 03/20/2016              Requested by: Darrow Bussing, MD [address] [place], [place] [address] PCP: Darrow Bussing, MD   Assessment & Plan: Visit Diagnoses:  1. Below knee amputation status, right (HCC)     Plan: Due to the slow healing and drainage that he is having we will schedule wound debridement and possible revision of the below knee amputation. We'll also plan to apply a wound VAC. Surgical procedure discussed with patient. All questions answered. See about getting this on the schedule this week.  Note for work keeping him out at least 6-8 more weeks but this obviously could be longer.  Follow-Up Instructions: Return for Return 1 week postop.   Orders:  No orders of the defined types were placed in this encounter.  No orders of the defined types were placed in this encounter.     Procedures: No procedures performed   Clinical Data: No additional findings.   Subjective: Chief Complaint  Patient presents with  . Right Knee - Follow-up    [name] is here to  follow up of his right BKA. He is weight bearing with walker. He is doing well. No pain. He has some drainage in the wound. Changes dressing every other day. Wears shrinker and Ampshield brace for protection. Overall doing fine.  Still been having some drainage from his wound.  Review of Systems  Constitutional: Negative.   HENT: Negative.   Skin: Positive for wound.  Psychiatric/Behavioral: Negative.      Objective: Vital Signs: BP (!) 142/82   Pulse 99   Physical Exam  Constitutional: He is oriented to person, place, and time. No distress.  HENT:  Head: Atraumatic.  Eyes: Pupils are equal, round, and reactive to light.  Musculoskeletal:  He does continue to have some serous drainage his stump wound. Some swelling. Areas of eschar. Some areas where the  wound is open.  Neurological: He is alert and oriented to person, place, and time.    Ortho Exam  Specialty Comments:  No specialty comments available.  Imaging: No results found.   PMFS History: Patient Active Problem List   Diagnosis Date Noted  . Acute blood loss anemia 01/20/2016  . Status post below knee amputation of right lower extremity (HCC) 01/20/2016  . Hyponatremia 01/17/2016  . AKI (acute kidney injury) (HCC) 01/17/2016  . Hyperbilirubinemia 01/17/2016  . Bacteremia due to Escherichia coli 01/17/2016  . Sepsis (HCC) 01/17/2016  . DKA (diabetic ketoacidoses) (HCC) 01/15/2016  . Gas gangrene of foot (HCC)   . Foot amputation status, right (HCC) 01/05/2016  . Ulcer of toe of right foot, with necrosis of bone (HCC) 12/27/2015  . Controlled diabetes mellitus with diabetic peripheral angiopathy and gangrene (HCC)   . 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 Transaminitis 01/07/2015  . Osteomyelitis of left foot (HCC) 01/06/2015  . Uncontrolled diabetes mellitus with foot ulcer, with long-term current use of insulin (HCC) 01/06/2015  . Anemia 01/06/2015  . Osteomyelitis (HCC) 01/05/2015   Past Medical History:  Diagnosis Date  . Diabetes mellitus without complication (HCC)    Type 1  . Heart murmur    "years ago"  .  Hypertension    "years ago"  . 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, with long-term current use of insulin (HCC)     Family History  Problem Relation Age of Onset  . Hypertension Mother   . Diabetes Mother   . Cancer Mother   . Prostate cancer Father     Past Surgical History:  Procedure Laterality Date  . AMPUTATION Left 01/07/2015   Procedure: Left Foot 1st Ray Amputation;  Surgeon: Nadara MustardMarcus Duda V, MD;  [facility];  Service: Orthopedics;  Laterality: Left;  . AMPUTATION Left 07/15/2015   Procedure: Left Transmetatarsal  Amputation;  Surgeon: Nadara MustardMarcus V Duda, MD;  Location: [facility] OR;  Service: Orthopedics;  Laterality: Left;  . AMPUTATION Right 12/30/2015   Procedure: 5th Ray Amputation Right Foot;  Surgeon: Nadara MustardMarcus V Duda, MD;  [facility];  Service: Orthopedics;  Laterality: Right;  . AMPUTATION Right 01/15/2016   Procedure: GUILLOTINE AMPUTATION  ABOVE THE ANKLE AMPUTATION;  Surgeon: Eldred MangesMark C Yates, MD;  Location: [facility] OR;  Service: Orthopedics;  Laterality: Right;  . AMPUTATION Right 01/18/2016   Procedure: AMPUTATION BELOW KNEE;  Surgeon: Eldred MangesMark C Yates, MD;  Location: [facility] OR;  Service: Orthopedics;  Laterality: Right;   Social History   Occupational History  . Not on file.   Social History Main Topics  . Smoking status: Never Smoker  . Smokeless tobacco: Never Used  . Alcohol use No  . Drug use: No  . Sexual activity: Not on file

## 2016-03-20 NOTE — Progress Notes (Signed)
Pt denies SOB, chest pain, and being under the care of a cardiologist. Pt denies having a stress test, echo and cardiac cath. Pt made aware to stop taking vitamins, fish oiland herbal medications. Do not take any NSAIDs ie: Ibuprofen, Advil, Naproxen, BC and Goody Powder or any medication containing Aspirin. Pt made aware to take 80% of Lantus insulin tonight ( 12-16 units instead of 15-20). Pt made aware to check BG every 2 hours prior to arrival. Pt made aware to drink 4 ounces of apple juice for a BG < 70 recheck, BG 15 minutes after drinking juice and phone # to SS if BG remains <70 after intervention. Pt verbalized understanding of al pre-op instructions.

## 2016-03-21 ENCOUNTER — Encounter (HOSPITAL_COMMUNITY): Payer: Self-pay | Admitting: *Deleted

## 2016-03-21 ENCOUNTER — Inpatient Hospital Stay (HOSPITAL_COMMUNITY)
Admission: RE | Admit: 2016-03-21 | Discharge: 2016-03-23 | DRG: 475 | Disposition: A | Discharge status: Home / Self Care | Payer: BLUE CROSS/BLUE SHIELD | Source: Ambulatory Visit | Attending: Orthopaedic Surgery | Admitting: Orthopaedic Surgery

## 2016-03-21 ENCOUNTER — Encounter (HOSPITAL_COMMUNITY)
Admission: RE | Disposition: A | Discharge status: Home / Self Care | Payer: Self-pay | Source: Ambulatory Visit | Attending: Orthopaedic Surgery

## 2016-03-21 ENCOUNTER — Inpatient Hospital Stay (HOSPITAL_COMMUNITY): Payer: BLUE CROSS/BLUE SHIELD | Admitting: Anesthesiology

## 2016-03-21 DIAGNOSIS — Z7982 Long term (current) use of aspirin: Secondary | ICD-10-CM | POA: Diagnosis not present

## 2016-03-21 DIAGNOSIS — Z794 Long term (current) use of insulin: Secondary | ICD-10-CM | POA: Diagnosis not present

## 2016-03-21 DIAGNOSIS — T8781 Dehiscence of amputation stump: Secondary | ICD-10-CM | POA: Diagnosis present

## 2016-03-21 DIAGNOSIS — Z8042 Family history of malignant neoplasm of prostate: Secondary | ICD-10-CM | POA: Diagnosis not present

## 2016-03-21 DIAGNOSIS — Y835 Amputation of limb(s) as the cause of abnormal reaction of the patient, or of later complication, without mention of misadventure at the time of the procedure: Secondary | ICD-10-CM | POA: Diagnosis present

## 2016-03-21 DIAGNOSIS — Z833 Family history of diabetes mellitus: Secondary | ICD-10-CM | POA: Diagnosis not present

## 2016-03-21 DIAGNOSIS — T8131XA Disruption of external operation (surgical) wound, not elsewhere classified, initial encounter: Secondary | ICD-10-CM

## 2016-03-21 DIAGNOSIS — E1052 Type 1 diabetes mellitus with diabetic peripheral angiopathy with gangrene: Secondary | ICD-10-CM | POA: Diagnosis present

## 2016-03-21 DIAGNOSIS — Z8249 Family history of ischemic heart disease and other diseases of the circulatory system: Secondary | ICD-10-CM

## 2016-03-21 DIAGNOSIS — T8130XA Disruption of wound, unspecified, initial encounter: Secondary | ICD-10-CM | POA: Diagnosis present

## 2016-03-21 DIAGNOSIS — T8131XD Disruption of external operation (surgical) wound, not elsewhere classified, subsequent encounter: Secondary | ICD-10-CM | POA: Diagnosis not present

## 2016-03-21 HISTORY — DX: Other complications of anesthesia, initial encounter: T88.59XA

## 2016-03-21 HISTORY — PX: STUMP REVISION: SHX6102

## 2016-03-21 HISTORY — DX: Adverse effect of unspecified anesthetic, initial encounter: T41.45XA

## 2016-03-21 HISTORY — DX: Dehiscence of amputation stump: T87.81

## 2016-03-21 LAB — GLUCOSE, CAPILLARY
GLUCOSE-CAPILLARY: 106 mg/dL — AB (ref 65–99)
GLUCOSE-CAPILLARY: 231 mg/dL — AB (ref 65–99)
GLUCOSE-CAPILLARY: 58 mg/dL — AB (ref 65–99)
Glucose-Capillary: 122 mg/dL — ABNORMAL HIGH (ref 65–99)

## 2016-03-21 LAB — CBC
HCT: 33.3 % — ABNORMAL LOW (ref 39.0–52.0)
Hemoglobin: 10.8 g/dL — ABNORMAL LOW (ref 13.0–17.0)
MCH: 27.8 pg (ref 26.0–34.0)
MCHC: 32.4 g/dL (ref 30.0–36.0)
MCV: 85.6 fL (ref 78.0–100.0)
Platelets: 317 10*3/uL (ref 150–400)
RBC: 3.89 MIL/uL — ABNORMAL LOW (ref 4.22–5.81)
RDW: 14.5 % (ref 11.5–15.5)
WBC: 4.9 10*3/uL (ref 4.0–10.5)

## 2016-03-21 LAB — COMPREHENSIVE METABOLIC PANEL
ALK PHOS: 115 U/L (ref 38–126)
ALT: 55 U/L (ref 17–63)
AST: 35 U/L (ref 15–41)
Albumin: 3.2 g/dL — ABNORMAL LOW (ref 3.5–5.0)
Anion gap: 5 (ref 5–15)
BUN: 13 mg/dL (ref 6–20)
CALCIUM: 9.2 mg/dL (ref 8.9–10.3)
CHLORIDE: 105 mmol/L (ref 101–111)
CO2: 29 mmol/L (ref 22–32)
CREATININE: 0.87 mg/dL (ref 0.61–1.24)
Glucose, Bld: 111 mg/dL — ABNORMAL HIGH (ref 65–99)
Potassium: 4.1 mmol/L (ref 3.5–5.1)
Sodium: 139 mmol/L (ref 135–145)
Total Bilirubin: 0.7 mg/dL (ref 0.3–1.2)
Total Protein: 6.4 g/dL — ABNORMAL LOW (ref 6.5–8.1)

## 2016-03-21 SURGERY — REVISION, AMPUTATION SITE
Anesthesia: General | Site: Leg Lower | Laterality: Right

## 2016-03-21 MED ORDER — CHLORHEXIDINE GLUCONATE 4 % EX LIQD: 60.0000 mL | Freq: Once | CUTANEOUS | Status: DC

## 2016-03-21 MED ORDER — METHOCARBAMOL 500 MG PO TABS
500.0000 mg | ORAL_TABLET | Freq: Four times a day (QID) | ORAL | Status: DC | PRN
  Administered 2016-03-22 – 2016-03-23 (×2): 500 mg via ORAL
  Filled 2016-03-21 (×3): qty 1

## 2016-03-21 MED ORDER — DEXTROSE 50 % IV SOLN
INTRAVENOUS | Status: AC
  Administered 2016-03-21: 25 mL via INTRAVENOUS
  Filled 2016-03-21: qty 50

## 2016-03-21 MED ORDER — ONDANSETRON HCL 4 MG/2ML IJ SOLN
INTRAMUSCULAR | Status: AC
  Filled 2016-03-21: qty 10

## 2016-03-21 MED ORDER — PHENYLEPHRINE 40 MCG/ML (10ML) SYRINGE FOR IV PUSH (FOR BLOOD PRESSURE SUPPORT)
PREFILLED_SYRINGE | INTRAVENOUS | Status: AC
  Filled 2016-03-21: qty 10

## 2016-03-21 MED ORDER — CEFAZOLIN SODIUM-DEXTROSE 2-4 GM/100ML-% IV SOLN
2.0000 g | INTRAVENOUS | Status: AC
  Administered 2016-03-21: 2 g via INTRAVENOUS
  Filled 2016-03-21: qty 100

## 2016-03-21 MED ORDER — OXYCODONE HCL 5 MG PO TABS: 5.0000 mg | ORAL_TABLET | Freq: Once | ORAL | Status: DC | PRN

## 2016-03-21 MED ORDER — FENTANYL CITRATE (PF) 100 MCG/2ML IJ SOLN
INTRAMUSCULAR | Status: AC
  Filled 2016-03-21: qty 4

## 2016-03-21 MED ORDER — LIDOCAINE HCL (CARDIAC) 20 MG/ML IV SOLN
INTRAVENOUS | Status: DC | PRN
  Administered 2016-03-21: 100 mg via INTRATRACHEAL

## 2016-03-21 MED ORDER — ONDANSETRON HCL 4 MG/2ML IJ SOLN
INTRAMUSCULAR | Status: DC | PRN
  Administered 2016-03-21: 4 mg via INTRAVENOUS

## 2016-03-21 MED ORDER — ONDANSETRON HCL 4 MG/2ML IJ SOLN: 4.0000 mg | Freq: Four times a day (QID) | INTRAMUSCULAR | Status: DC | PRN

## 2016-03-21 MED ORDER — FERROUS SULFATE 325 (65 FE) MG PO TABS
325.0000 mg | ORAL_TABLET | Freq: Two times a day (BID) | ORAL | Status: DC
  Administered 2016-03-22 – 2016-03-23 (×4): 325 mg via ORAL
  Filled 2016-03-21 (×4): qty 1

## 2016-03-21 MED ORDER — METHOCARBAMOL 1000 MG/10ML IJ SOLN
500.0000 mg | Freq: Four times a day (QID) | INTRAMUSCULAR | Status: DC | PRN
  Filled 2016-03-21: qty 5

## 2016-03-21 MED ORDER — LISINOPRIL 10 MG PO TABS
10.0000 mg | ORAL_TABLET | Freq: Every day | ORAL | Status: DC
  Administered 2016-03-21 – 2016-03-23 (×3): 10 mg via ORAL
  Filled 2016-03-21 (×3): qty 1

## 2016-03-21 MED ORDER — MIDAZOLAM HCL 5 MG/5ML IJ SOLN
INTRAMUSCULAR | Status: DC | PRN
  Administered 2016-03-21: 2 mg via INTRAVENOUS

## 2016-03-21 MED ORDER — CEFAZOLIN IN D5W 1 GM/50ML IV SOLN
1.0000 g | Freq: Three times a day (TID) | INTRAVENOUS | Status: AC
  Administered 2016-03-21 – 2016-03-22 (×3): 1 g via INTRAVENOUS
  Filled 2016-03-21 (×3): qty 50

## 2016-03-21 MED ORDER — LACTATED RINGERS IV SOLN
INTRAVENOUS | Status: DC | PRN
  Administered 2016-03-21 (×2): via INTRAVENOUS

## 2016-03-21 MED ORDER — PROPOFOL 10 MG/ML IV BOLUS
INTRAVENOUS | Status: AC
  Filled 2016-03-21: qty 20

## 2016-03-21 MED ORDER — HYDROMORPHONE HCL 2 MG/ML IJ SOLN: 1.0000 mg | INTRAMUSCULAR | Status: DC | PRN

## 2016-03-21 MED ORDER — PHENYLEPHRINE HCL 10 MG/ML IJ SOLN
INTRAMUSCULAR | Status: DC | PRN
  Administered 2016-03-21: 120 ug via INTRAVENOUS
  Administered 2016-03-21: 80 ug via INTRAVENOUS

## 2016-03-21 MED ORDER — FENTANYL CITRATE (PF) 100 MCG/2ML IJ SOLN
INTRAMUSCULAR | Status: DC | PRN
  Administered 2016-03-21 (×2): 50 ug via INTRAVENOUS

## 2016-03-21 MED ORDER — OXYCODONE HCL 5 MG PO TABS
5.0000 mg | ORAL_TABLET | ORAL | Status: DC | PRN
  Administered 2016-03-21 – 2016-03-23 (×9): 10 mg via ORAL
  Filled 2016-03-21 (×9): qty 2

## 2016-03-21 MED ORDER — ACETAMINOPHEN 325 MG PO TABS: 650.0000 mg | ORAL_TABLET | Freq: Four times a day (QID) | ORAL | Status: DC | PRN

## 2016-03-21 MED ORDER — HYDROMORPHONE HCL 1 MG/ML IJ SOLN: 0.2500 mg | INTRAMUSCULAR | Status: DC | PRN

## 2016-03-21 MED ORDER — MIDAZOLAM HCL 2 MG/2ML IJ SOLN
INTRAMUSCULAR | Status: AC
  Filled 2016-03-21: qty 2

## 2016-03-21 MED ORDER — METOCLOPRAMIDE HCL 5 MG/ML IJ SOLN: 5.0000 mg | Freq: Three times a day (TID) | INTRAMUSCULAR | Status: DC | PRN

## 2016-03-21 MED ORDER — SODIUM CHLORIDE 0.9 % IV SOLN
INTRAVENOUS | Status: DC
  Administered 2016-03-21 – 2016-03-22 (×2): via INTRAVENOUS

## 2016-03-21 MED ORDER — LACTATED RINGERS IV SOLN
Freq: Once | INTRAVENOUS | Status: AC
  Administered 2016-03-21: 13:00:00 via INTRAVENOUS

## 2016-03-21 MED ORDER — ACETAMINOPHEN 650 MG RE SUPP: 650.0000 mg | Freq: Four times a day (QID) | RECTAL | Status: DC | PRN

## 2016-03-21 MED ORDER — METOCLOPRAMIDE HCL 5 MG PO TABS: 5.0000 mg | ORAL_TABLET | Freq: Three times a day (TID) | ORAL | Status: DC | PRN

## 2016-03-21 MED ORDER — DEXTROSE 50 % IV SOLN
25.0000 mL | Freq: Once | INTRAVENOUS | Status: AC
Start: 2016-03-21 — End: 2016-03-21
  Administered 2016-03-21: 25 mL via INTRAVENOUS
  Filled 2016-03-21: qty 50

## 2016-03-21 MED ORDER — ONDANSETRON HCL 4 MG PO TABS: 4.0000 mg | ORAL_TABLET | Freq: Four times a day (QID) | ORAL | Status: DC | PRN

## 2016-03-21 MED ORDER — OXYCODONE HCL 5 MG/5ML PO SOLN: 5.0000 mg | Freq: Once | ORAL | Status: DC | PRN

## 2016-03-21 MED ORDER — HYPROMELLOSE (GONIOSCOPIC) 2.5 % OP SOLN
1.0000 [drp] | Freq: Four times a day (QID) | OPHTHALMIC | Status: DC | PRN
  Filled 2016-03-21: qty 15

## 2016-03-21 MED ORDER — ATORVASTATIN CALCIUM 20 MG PO TABS
20.0000 mg | ORAL_TABLET | Freq: Every day | ORAL | Status: DC
  Administered 2016-03-21 – 2016-03-23 (×3): 20 mg via ORAL
  Filled 2016-03-21 (×3): qty 1

## 2016-03-21 MED ORDER — INSULIN ASPART 100 UNIT/ML ~~LOC~~ SOLN
7.0000 [IU] | Freq: Three times a day (TID) | SUBCUTANEOUS | Status: DC
  Administered 2016-03-22: 7 [IU] via SUBCUTANEOUS

## 2016-03-21 MED ORDER — PROPOFOL 10 MG/ML IV BOLUS
INTRAVENOUS | Status: DC | PRN
  Administered 2016-03-21: 200 mg via INTRAVENOUS

## 2016-03-21 MED ORDER — DOCUSATE SODIUM 100 MG PO CAPS
100.0000 mg | ORAL_CAPSULE | Freq: Two times a day (BID) | ORAL | Status: DC
  Administered 2016-03-21 – 2016-03-23 (×4): 100 mg via ORAL
  Filled 2016-03-21 (×4): qty 1

## 2016-03-21 MED ORDER — 0.9 % SODIUM CHLORIDE (POUR BTL) OPTIME
TOPICAL | Status: DC | PRN
  Administered 2016-03-21 (×2): 1000 mL

## 2016-03-21 SURGICAL SUPPLY — 56 items
BANDAGE ACE 4X5 VEL STRL LF (GAUZE/BANDAGES/DRESSINGS) ×3 IMPLANT
BANDAGE ACE 6X5 VEL STRL LF (GAUZE/BANDAGES/DRESSINGS) ×3 IMPLANT
BANDAGE ELASTIC 6 VELCRO ST LF (GAUZE/BANDAGES/DRESSINGS) ×3 IMPLANT
BANDAGE ESMARK 6X9 LF (GAUZE/BANDAGES/DRESSINGS) ×1 IMPLANT
BLADE SAW SAG 29X58X.64 (BLADE) ×3 IMPLANT
BLADE SURG 10 STRL SS (BLADE) ×3 IMPLANT
BNDG COHESIVE 6X5 TAN STRL LF (GAUZE/BANDAGES/DRESSINGS) ×3 IMPLANT
BNDG ESMARK 6X9 LF (GAUZE/BANDAGES/DRESSINGS) ×3
BNDG GAUZE ELAST 4 BULKY (GAUZE/BANDAGES/DRESSINGS) ×3 IMPLANT
CANISTER WOUND CARE 500ML ATS (WOUND CARE) ×3 IMPLANT
CLEANER TIP ELECTROSURG 2X2 (MISCELLANEOUS) ×3 IMPLANT
COVER SURGICAL LIGHT HANDLE (MISCELLANEOUS) ×3 IMPLANT
CUFF TOURNIQUET SINGLE 24IN (TOURNIQUET CUFF) ×3 IMPLANT
CUFF TOURNIQUET SINGLE 34IN LL (TOURNIQUET CUFF) IMPLANT
CUFF TOURNIQUET SINGLE 44IN (TOURNIQUET CUFF) IMPLANT
DRAIN PENROSE 1/2X12 LTX STRL (WOUND CARE) IMPLANT
DRAPE HALF SHEET 40X57 (DRAPES) ×3 IMPLANT
DRAPE INCISE IOBAN 66X45 STRL (DRAPES) ×6 IMPLANT
DRAPE ORTHO SPLIT 77X108 STRL (DRAPES) ×2
DRAPE PROXIMA HALF (DRAPES) ×6 IMPLANT
DRAPE SURG ORHT 6 SPLT 77X108 (DRAPES) ×1 IMPLANT
DRAPE U-SHAPE 47X51 STRL (DRAPES) ×3 IMPLANT
DRSG VAC ATS SM SENSATRAC (GAUZE/BANDAGES/DRESSINGS) ×3 IMPLANT
DURAPREP 26ML APPLICATOR (WOUND CARE) ×3 IMPLANT
EVACUATOR 1/8 PVC DRAIN (DRAIN) IMPLANT
FACESHIELD WRAPAROUND (MASK) ×3 IMPLANT
GAUZE SPONGE 4X4 12PLY STRL (GAUZE/BANDAGES/DRESSINGS) ×3 IMPLANT
GAUZE XEROFORM 5X9 LF (GAUZE/BANDAGES/DRESSINGS) ×3 IMPLANT
GLOVE BIOGEL PI IND STRL 8 (GLOVE) ×2 IMPLANT
GLOVE BIOGEL PI INDICATOR 8 (GLOVE) ×4
GLOVE ORTHO TXT STRL SZ7.5 (GLOVE) ×6 IMPLANT
GOWN STRL REUS W/ TWL LRG LVL3 (GOWN DISPOSABLE) ×1 IMPLANT
GOWN STRL REUS W/ TWL XL LVL3 (GOWN DISPOSABLE) ×1 IMPLANT
GOWN STRL REUS W/TWL 2XL LVL3 (GOWN DISPOSABLE) ×3 IMPLANT
GOWN STRL REUS W/TWL LRG LVL3 (GOWN DISPOSABLE) ×2
GOWN STRL REUS W/TWL XL LVL3 (GOWN DISPOSABLE) ×2
KIT BASIN OR (CUSTOM PROCEDURE TRAY) ×3 IMPLANT
KIT ROOM TURNOVER OR (KITS) ×3 IMPLANT
MANIFOLD NEPTUNE II (INSTRUMENTS) ×3 IMPLANT
NEEDLE MAYO TROCAR (NEEDLE) ×3 IMPLANT
NS IRRIG 1000ML POUR BTL (IV SOLUTION) ×6 IMPLANT
PACK GENERAL/GYN (CUSTOM PROCEDURE TRAY) ×3 IMPLANT
PAD ARMBOARD 7.5X6 YLW CONV (MISCELLANEOUS) ×6 IMPLANT
SPONGE LAP 18X18 X RAY DECT (DISPOSABLE) IMPLANT
STAPLER VISISTAT 35W (STAPLE) ×3 IMPLANT
STOCKINETTE IMPERVIOUS LG (DRAPES) IMPLANT
SUT ETHILON 2 0 PSLX (SUTURE) ×9 IMPLANT
SUT SILK 3 0 SH CR/8 (SUTURE) ×3 IMPLANT
SUT VIC AB 0 CT1 27 (SUTURE) ×2
SUT VIC AB 0 CT1 27XBRD ANBCTR (SUTURE) ×1 IMPLANT
SUT VIC AB 1 CTX 18 (SUTURE) ×3 IMPLANT
SUT VICRYL 0 TIES 12 18 (SUTURE) ×3 IMPLANT
SUT VICRYL AB 2 0 TIES (SUTURE) ×3 IMPLANT
TOWEL OR 17X24 6PK STRL BLUE (TOWEL DISPOSABLE) ×3 IMPLANT
TOWEL OR 17X26 10 PK STRL BLUE (TOWEL DISPOSABLE) ×3 IMPLANT
WATER STERILE IRR 1000ML POUR (IV SOLUTION) ×3 IMPLANT

## 2016-03-21 NOTE — Transfer of Care (Signed)
Immediate Anesthesia Transfer of Care Note  Patient: Gordon Walker  Procedure(s) Performed: Procedure(s): Right Below Knee Amputation Stump Revision, WOUND VAC application (Right)  Patient Location: PACU  Anesthesia Type:General  Level of Consciousness: awake, alert  and oriented  Airway & Oxygen Therapy: Patient Spontanous Breathing and Patient connected to nasal cannula oxygen  Post-op Assessment: Report given to RN and Post -op Vital signs reviewed and stable  Post vital signs: Reviewed and stable  Last Vitals:  Vitals:   03/21/16 1250  BP: 116/79  Pulse: 75  Resp: 18  Temp: 36.9 C    Last Pain:  Vitals:   03/21/16 1306  TempSrc:   PainSc: 0-No pain      Patients Stated Pain Goal: 3 (03/21/16 1306)  Complications: No apparent anesthesia complications

## 2016-03-21 NOTE — Op Note (Signed)
Preop diagnosis: Right BKA wound incisional necrosis with partial dehiscence.  Postop diagnosis: Same  Procedure: Sharp excisional debridement of necrotic skin edges. Right below-knee amputation with revision with bone shortening and reclosure with incisional VAC  Surgeon: Annell GreeningMark Rita Prom M.D.  Assistant: Zonia KiefJames Owens PA-C medically necessary and present for the entire procedure.  Anesthesia : general LMA  Tourniquet: Less than 30 minutes.  This 51 year old male presented with septic shock necrotic foot gas gangrene had to have a guillotine amputation 01/15/2016 the day before Christmas and then had revision amputation to standard length ideal for prosthetic fitting done on 01/18/2016. He's been followed since that time in the office. He's had some gradual wound edge necrosis which increase now having serous drainage and is brought in for surgery for excision of necrotic tissue and revision date his healing before he developed osteomyelitis of the the stump of the tibia. Currently patient's having serous drainage and had necrotic skin edges with partial separation of the incision 2-3 cm.  Procedure after induction of the anesthesia LMA tube placement proximal thigh tourniquet with the standard the Betadine prep timeout procedure Ancef prophylaxis sterile skin Gordon Walker was used for [employer] Inccrosshatches. Areas of separation very between 2-3 cm with necrotic tissue and and midportion of the stump there was serosanguineous drainage. Complete debridement of necrotic skin subcutaneous tissue muscle and some of the gastroc fascia was performed with the 10 scalpel blade. There was no purulence encountered. 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 In the anterior portion where the wound was draining the most there was some gastroc fascia that had to be sharply  debrided. It was reported back to good-looking gastroc tendon. In a stump was inspected after a cutting the sutures as they attach the anterior periosteum sharply with the 10 blade. Periosteum was elevated proximally and 3 cm was resected off of the distal tibia the edge was beveled. Distal fibula was not long and was palpated no sharp spikes. Gastroc muscle with the tendon was mobilized over and resewn to the distal anterior periosteum overlying the stump where the tibia had been cut short and for revision. Intermittent 0 Vicryl sutures are placed. 2-0 Vicryl was used for zone mattress sutures with the knots tied on the anterior skin. Care was taken not to put excessive tension and it was able to be closed and then a wound VAC small was applied cleaned. Prior closure of the stump copious irrigation was used tibia is carefully inspected no evidence of deep infection was present and there is no purulence. 2 L saline lavage copiously used. 2-0 nylon the skin and then small VAC with low leak rate. Curlex and Ace wrap was applied over the top patient tolerated the procedure well.

## 2016-03-21 NOTE — H&P (Signed)
Gordon Walker is an 51 y.o. male.   Chief Complaint: Right BKA stump delayed healing HPI: Patient with the above complaint presents to the hospital today for surgical intervention.  Past Medical History:  Diagnosis Date  . Complication of anesthesia    anesthesia complication 01/18/16; pt unaware what happened   . Dehiscence of amputation stump (HCC)    right  . Diabetes mellitus without complication (HCC)    Type 1  . Heart murmur    "years ago"  . Hypertension    "years ago"  . 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, with long-term current use of insulin [facility])     Past Surgical History:  Procedure Laterality Date  . AMPUTATION Left 01/07/2015   Procedure: Left Foot 1st Ray Amputation;  Surgeon: Nadara Mustard, MD;  Location: [facility];  Service: Orthopedics;  Laterality: Left;  . AMPUTATION Left 07/15/2015   Procedure: Left Transmetatarsal Amputation;  Surgeon: Nadara Mustard, MD;  Location: [facility] OR;  Service: Orthopedics;  Laterality: Left;  . AMPUTATION Right 12/30/2015   Procedure: 5th Ray Amputation Right Foot;  Surgeon: Nadara Mustard, MD;  Location: [facility] OR;  Service: Orthopedics;  Laterality: Right;  . AMPUTATION Right 01/15/2016   Procedure: GUILLOTINE AMPUTATION  ABOVE THE ANKLE AMPUTATION;  Surgeon: Eldred Manges, MD;  Location: [facility] OR;  Service: Orthopedics;  Laterality: Right;  . AMPUTATION Right 01/18/2016   Procedure: AMPUTATION BELOW KNEE;  Surgeon: Eldred Manges, MD;  Location: [facility] OR;  Service: Orthopedics;  Laterality: Right;    Family History  Problem Relation Age of Onset  . Hypertension Mother   . Diabetes Mother   . Cancer Mother   . Prostate cancer Father    Social History:  reports that he has never smoked. He has never used smokeless tobacco. He reports that he does not drink alcohol or use drugs.  Allergies: No Known Allergies  Medications Prior to Admission  Medication Sig Dispense Refill  . aspirin EC 81 MG tablet Take  81 mg by mouth daily.    Marland Kitchen atorvastatin (LIPITOR) 20 MG tablet Take 1 tablet (20 mg total) by mouth daily. 30 tablet 0  . ferrous sulfate 325 (65 FE) MG tablet Take 1 tablet (325 mg total) by mouth 2 (two) times daily with a meal. 60 tablet 0  . hydroxypropyl methylcellulose / hypromellose (ISOPTO TEARS / GONIOVISC) 2.5 % ophthalmic solution Place 1 drop into both eyes 4 (four) times daily as needed for dry eyes.    . insulin aspart (NOVOLOG FLEXPEN) 100 UNIT/ML FlexPen Inject 7 Units into the skin 3 (three) times daily with meals. And pen needles 3/day (Patient taking differently: Inject 6-8 Units into the skin 3 (three) times daily with meals. Dose depends on blood sugar.) 15 mL 11  . insulin glargine (LANTUS) 100 UNIT/ML injection Inject 0.15 mLs (15 Units total) into the skin at bedtime. (Patient taking differently: Inject 15-20 Units into the skin at bedtime. Dose depends on blood sugar.) 10 mL 11  . lisinopril (PRINIVIL,ZESTRIL) 10 MG tablet Take 1 tablet (10 mg total) by mouth daily. 30 tablet 0  . Multiple Vitamin (MULTIVITAMIN WITH MINERALS) TABS tablet Take 1 tablet by mouth daily.      No results found for this or any previous visit (from the past 48 hour(s)). No results found.  Review of Systems  Constitutional: Negative.   HENT: Negative.   Eyes: Negative.   Respiratory: Negative.   Genitourinary: Negative.  Skin: Negative.   Neurological: Negative.   Psychiatric/Behavioral: Negative.     There were no vitals taken for this visit. Physical Exam  Constitutional: He is oriented to person, place, and time. He appears well-developed. No distress.  HENT:  Head: Normocephalic.  Eyes: EOM are normal.  Neck: Normal range of motion.  Respiratory: No respiratory distress.  GI: He exhibits no distension.  Musculoskeletal:  Patient does have drainage from his right BKA stump. Wound dehiscence. Stump swelling.  Neurological: He is alert and oriented to person, place, and time.   Skin: Skin is warm.  Psychiatric: He has a normal mood and affect.     Assessment/Plan Delayed healing right BKA stump. Persistent drainage.  We'll proceed with Right Below Knee Amputation Stump Revision, Probable VAC as scheduled. Surgical procedure discussed in detail. All questions answered.  Zonia KiefJames Tajay Muzzy, PA-C 03/21/2016, 12:44 PM

## 2016-03-21 NOTE — Anesthesia Postprocedure Evaluation (Signed)
Anesthesia Post Note  Patient: Charletta CousinFrederick Barua  Procedure(s) Performed: Procedure(s) (LRB): Right Below Knee Amputation Stump Revision, WOUND VAC application (Right)  Patient location during evaluation: PACU Anesthesia Type: General Level of consciousness: awake Pain management: pain level controlled Respiratory status: spontaneous breathing Cardiovascular status: stable Anesthetic complications: no       Last Vitals:  Vitals:   03/21/16 1250 03/21/16 1630  BP: 116/79 119/82  Pulse: 75 67  Resp: 18 14  Temp: 36.9 C 36.4 C    Last Pain:  Vitals:   03/21/16 1630  TempSrc:   PainSc: 0-No pain                 Rosamaria Donn

## 2016-03-21 NOTE — Anesthesia Postprocedure Evaluation (Signed)
Anesthesia Post Note  Patient: Gordon Walker  Procedure(s) Performed: Procedure(s) (LRB): Right Below Knee Amputation Stump Revision, WOUND VAC application (Right)  Patient location during evaluation: PACU Anesthesia Type: General Level of consciousness: awake Pain management: pain level controlled Respiratory status: spontaneous breathing Cardiovascular status: stable Anesthetic complications: no       Last Vitals:  Vitals:   03/21/16 1250 03/21/16 1630  BP: 116/79 119/82  Pulse: 75 67  Resp: 18 14  Temp: 36.9 C 36.4 C    Last Pain:  Vitals:   03/21/16 1630  TempSrc:   PainSc: 0-No pain                 Lewin Pellow     

## 2016-03-21 NOTE — Progress Notes (Addendum)
Pt upset over lantus not being ordered for nightly medications. Pt stated that he has had multiple issues within the past year with his various surgeries and infections. RN paged MD to try and get nightly lantus reordered. On call MD ordered 15 units of lantus for tonight and can reevaluate tomorrow. Pt typically takes between 15-20 units depending on blood sugars. This was discussed with patient and he is ok with taking the 15 units tonight.

## 2016-03-21 NOTE — Anesthesia Preprocedure Evaluation (Signed)
Anesthesia Evaluation  Patient identified by MRN, date of birth, ID band Patient awake    Reviewed: Allergy & Precautions, H&P , NPO status , Patient's Chart, lab work & pertinent test results  Airway Mallampati: II   Neck ROM: full    Dental   Pulmonary neg pulmonary ROS,    breath sounds clear to auscultation       Cardiovascular hypertension, + Peripheral Vascular Disease   Rhythm:regular Rate:Normal     Neuro/Psych    GI/Hepatic   Endo/Other  diabetes, Type 1, Insulin Dependent  Renal/GU      Musculoskeletal   Abdominal   Peds  Hematology  (+) anemia ,   Anesthesia Other Findings   Reproductive/Obstetrics                             Anesthesia Physical Anesthesia Plan  ASA: III  Anesthesia Plan: General   Post-op Pain Management:    Induction: Intravenous  Airway Management Planned: LMA  Additional Equipment:   Intra-op Plan:   Post-operative Plan:   Informed Consent: I have reviewed the patients History and Physical, chart, labs and discussed the procedure including the risks, benefits and alternatives for the proposed anesthesia with the patient or authorized representative who has indicated his/her understanding and acceptance.     Plan Discussed with: CRNA, Anesthesiologist and Surgeon  Anesthesia Plan Comments:         Anesthesia Quick Evaluation

## 2016-03-21 NOTE — Anesthesia Procedure Notes (Signed)
Procedure Name: LMA Insertion Date/Time: 03/21/2016 3:34 PM Performed by: Marena ChancyBECKNER, Earsie Humm S Pre-anesthesia Checklist: Patient identified, Emergency Drugs available, Suction available and Patient being monitored Patient Re-evaluated:Patient Re-evaluated prior to inductionOxygen Delivery Method: Circle System Utilized Preoxygenation: Pre-oxygenation with 100% oxygen Intubation Type: IV induction Ventilation: Mask ventilation without difficulty LMA: LMA inserted LMA Size: 4.0 Number of attempts: 1 Airway Equipment and Method: Bite block Placement Confirmation: positive ETCO2 Tube secured with: Tape Dental Injury: Teeth and Oropharynx as per pre-operative assessment

## 2016-03-22 ENCOUNTER — Encounter (HOSPITAL_COMMUNITY): Payer: Self-pay | Admitting: Orthopaedic Surgery

## 2016-03-22 LAB — GLUCOSE, CAPILLARY
GLUCOSE-CAPILLARY: 199 mg/dL — AB (ref 65–99)
GLUCOSE-CAPILLARY: 62 mg/dL — AB (ref 65–99)
GLUCOSE-CAPILLARY: 86 mg/dL (ref 65–99)
Glucose-Capillary: 199 mg/dL — ABNORMAL HIGH (ref 65–99)
Glucose-Capillary: 54 mg/dL — ABNORMAL LOW (ref 65–99)
Glucose-Capillary: 251 mg/dL — ABNORMAL HIGH (ref 65–99)
Glucose-Capillary: 245 mg/dL — ABNORMAL HIGH (ref 65–99)

## 2016-03-22 LAB — CBC WITH DIFFERENTIAL/PLATELET
BASOS ABS: 0 10*3/uL (ref 0.0–0.1)
BASOS PCT: 0 %
EOS ABS: 0.1 10*3/uL (ref 0.0–0.7)
Eosinophils Relative: 1 %
HCT: 32.7 % — ABNORMAL LOW (ref 39.0–52.0)
HEMOGLOBIN: 10.5 g/dL — AB (ref 13.0–17.0)
LYMPHS ABS: 1.7 10*3/uL (ref 0.7–4.0)
Lymphocytes Relative: 26 %
MCH: 27.7 pg (ref 26.0–34.0)
MCHC: 32.1 g/dL (ref 30.0–36.0)
MCV: 86.3 fL (ref 78.0–100.0)
Monocytes Absolute: 0.6 10*3/uL (ref 0.1–1.0)
Monocytes Relative: 9 %
NEUTROS PCT: 64 %
Neutro Abs: 4.1 10*3/uL (ref 1.7–7.7)
Platelets: 301 10*3/uL (ref 150–400)
RBC: 3.79 MIL/uL — AB (ref 4.22–5.81)
RDW: 14.3 % (ref 11.5–15.5)
WBC: 6.4 10*3/uL (ref 4.0–10.5)

## 2016-03-22 LAB — BASIC METABOLIC PANEL
Anion gap: 3 — ABNORMAL LOW (ref 5–15)
BUN: 10 mg/dL (ref 6–20)
CHLORIDE: 106 mmol/L (ref 101–111)
CO2: 28 mmol/L (ref 22–32)
CREATININE: 0.93 mg/dL (ref 0.61–1.24)
Calcium: 8.6 mg/dL — ABNORMAL LOW (ref 8.9–10.3)
Glucose, Bld: 208 mg/dL — ABNORMAL HIGH (ref 65–99)
POTASSIUM: 4 mmol/L (ref 3.5–5.1)
SODIUM: 137 mmol/L (ref 135–145)

## 2016-03-22 MED ORDER — INSULIN GLARGINE 100 UNIT/ML ~~LOC~~ SOLN
15.0000 [IU] | Freq: Every day | SUBCUTANEOUS | Status: DC
  Administered 2016-03-22: 15 [IU] via SUBCUTANEOUS
  Filled 2016-03-22 (×2): qty 0.15

## 2016-03-22 MED ORDER — INSULIN GLARGINE 100 UNIT/ML ~~LOC~~ SOLN
15.0000 [IU] | Freq: Once | SUBCUTANEOUS | Status: AC
  Administered 2016-03-22: 15 [IU] via SUBCUTANEOUS
  Filled 2016-03-22: qty 0.15

## 2016-03-22 MED ORDER — DEXTROSE 50 % IV SOLN
25.0000 mL | Freq: Once | INTRAVENOUS | Status: AC
  Administered 2016-03-22: 25 mL via INTRAVENOUS

## 2016-03-22 MED ORDER — INSULIN ASPART 100 UNIT/ML ~~LOC~~ SOLN
5.0000 [IU] | Freq: Three times a day (TID) | SUBCUTANEOUS | Status: DC
  Administered 2016-03-22: 5 [IU] via SUBCUTANEOUS
  Administered 2016-03-23: 3 [IU] via SUBCUTANEOUS
  Administered 2016-03-23: 5 [IU] via SUBCUTANEOUS

## 2016-03-22 MED ORDER — DEXTROSE 50 % IV SOLN
INTRAVENOUS | Status: AC
  Filled 2016-03-22: qty 50

## 2016-03-22 NOTE — Evaluation (Signed)
Physical Therapy Evaluation Patient Details Name: Gordon Walker MRN: [id number] DOB: 09-Aug-1965 Today's Date: 03/22/2016   History of Present Illness  51 y.o.malewith DM type 1, HTN, L transmetatarsal amputation, and PVD who underwent R BKA on 01/18/16 with a revision 03/21/16  Clinical Impression  Pt s/p revision R LE residual limb and presents with functional mobility limitations 2* post op pain.  Pt very motivated and with good awareness of safety and follow up rehab based on recent BKA.  Pt plans dc home with assist of family.    Follow Up Recommendations No PT follow up    Equipment Recommendations  None recommended by PT    Recommendations for Other Services       Precautions / Restrictions Precautions Precautions: Fall Restrictions Weight Bearing Restrictions: Yes RLE Weight Bearing: Non weight bearing      Mobility  Bed Mobility Overal bed mobility: Modified Independent             General bed mobility comments: Pt unassist supine<>sit  Transfers Overall transfer level: Needs assistance Equipment used: Rolling walker (2 wheeled) Transfers: Sit to/from Stand Sit to Stand: Supervision         General transfer comment: min cues for use of UEs to self assist  Ambulation/Gait Ambulation/Gait assistance: Min guard;Supervision Ambulation Distance (Feet): 140 Feet Assistive device: Rolling walker (2 wheeled) Gait Pattern/deviations: Step-to pattern;Shuffle Gait velocity: decr Gait velocity interpretation: Below normal speed for age/gender General Gait Details: min cues for posture and position from [employer]            Wheelchair Mobility    Modified Rankin (Stroke Patients Only)       Balance Overall balance assessment: Needs assistance Sitting-balance support: Feet supported;No upper extremity supported Sitting balance-Leahy Scale: Good     Standing balance support: Bilateral upper extremity supported Standing balance-Leahy Scale:  Fair Standing balance comment: Pt requires min outside support for balance                             Pertinent Vitals/Pain Pain Assessment: 0-10 Pain Score: 3  Pain Location: R residual limb Pain Descriptors / Indicators: Aching Pain Intervention(s): Limited activity within patient's tolerance;Monitored during session;Premedicated before session    Home Living Family/patient expects to be discharged to:: Private residence Living Arrangements: Spouse/significant other Available Help at Discharge: Family;Available PRN/intermittently Type of Home: House Home Access: Level entry     Home Layout: Two level Home Equipment: Walker - 2 wheels;Other (comment);Crutches      Prior Function Level of Independence: Independent with assistive device(s)         Comments: using RW at home with crutches to assist on stairs to bedroom     Hand Dominance        Extremity/Trunk Assessment   Upper Extremity Assessment Upper Extremity Assessment: Overall WFL for tasks assessed    Lower Extremity Assessment Lower Extremity Assessment: RLE deficits/detail    Cervical / Trunk Assessment Cervical / Trunk Assessment: Normal  Communication   Communication: No difficulties  Cognition Arousal/Alertness: Awake/alert Behavior During Therapy: WFL for tasks assessed/performed Overall Cognitive Status: Within Functional Limits for tasks assessed                      General Comments      Exercises     Assessment/Plan    PT Assessment Patient needs continued PT services  PT Problem List Decreased balance;Decreased knowledge of use  of DME;Pain       PT Treatment Interventions DME instruction;Gait training;Stair training;Functional mobility training;Therapeutic activities;Therapeutic exercise;Patient/family education    PT Goals (Current goals can be found in the Care Plan section)  Acute Rehab PT Goals Patient Stated Goal: Regain IND and return to work PT Goal  Formulation: With patient Time For Goal Achievement: 03/24/16 Potential to Achieve Goals: Good    Frequency Min 6X/week   Barriers to discharge        Co-evaluation               End of Session Equipment Utilized During Treatment: Gait belt Activity Tolerance: Patient tolerated treatment well Patient left: in bed;with call bell/phone within reach Nurse Communication: Mobility status PT Visit Diagnosis: Difficulty in walking, not elsewhere classified (R26.2)         Time: 2952-84131043-1105 PT Time Calculation (min) (ACUTE ONLY): 22 min   Charges:   PT Evaluation $PT Eval Low Complexity: 1 Procedure     PT G Codes:         Jari Dipasquale 03/22/2016, 12:34 PM

## 2016-03-22 NOTE — Progress Notes (Signed)
OT Cancellation Note  Patient Details Name: Gordon Walker MRN: [id number] DOB: 1965-10-30   Cancelled Treatment:    Reason Eval/Treat Not Completed: OT screened, no needs identified, will sign off  [facility], LLC[facility],HILLARY  Kamare Caspers, OT/L  [phone] 03/22/2016 03/22/2016, 1:10 PM

## 2016-03-22 NOTE — Progress Notes (Signed)
Inpatient Diabetes Program Recommendations  AACE/ADA: New Consensus Statement on Inpatient Glycemic Control (2015)  Target Ranges:  Prepandial:   less than 140 mg/dL      Peak postprandial:   less than 180 mg/dL (1-2 hours)      Critically ill patients:  140 - 180 mg/dL   Lab Results  Component Value Date   GLUCAP 199 (H) 03/22/2016   HGBA1C 6.7 02/28/2016    Review of Glycemic Control  Diabetes history: DM1  Inpatient Diabetes Program Recommendations:   Insulin - Basal: Agree with home dose of Lantus 15 units daily  Correction (SSI): Please consider sensitive scale Novolog 0-9 units tidac  Insulin - Meal Coverage: Please consider decreasing to Novolog 5 units tidac if patient eats > 50% of meal (current order = Novolog 7 units tidac and patient takes Novolog  6-8 units at home tidac)  Thank you,  Kristine LineaKaren Irem Stoneham, RN, MSN Diabetes Coordinator Inpatient Diabetes Program [phone] (Team Pager)

## 2016-03-22 NOTE — Progress Notes (Signed)
   Subjective: 1 Day Post-Op Procedure(s) (LRB): Right Below Knee Amputation Stump Revision, WOUND VAC application (Right) Patient reports pain as mild.    Objective: Vital signs in last 24 hours: Temp:  [97.5 F (36.4 C)-98.6 F (37 C)] 98.2 F (36.8 C) (03/01 0524) Pulse Rate:  [67-97] 79 (03/01 0524) Resp:  [12-19] 16 (03/01 0524) BP: (115-158)/(66-94) 115/69 (03/01 0524) SpO2:  [97 %-100 %] 99 % (03/01 0524) Weight:  [160 lb (72.6 kg)] 160 lb (72.6 kg) (02/28 1250)  Intake/Output from previous day: 02/28 0701 - 03/01 0700 In: 2430.9 [P.O.:240; I.V.:2090.9; IV Piggyback:100] Out: 520 [Urine:500; Blood:20] Intake/Output this shift: No intake/output data recorded.   Recent Labs  03/21/16 1254 03/22/16 0536  HGB 10.8* 10.5*    Recent Labs  03/21/16 1254 03/22/16 0536  WBC 4.9 6.4  RBC 3.89* 3.79*  HCT 33.3* 32.7*  PLT 317 301    Recent Labs  03/21/16 1254 03/22/16 0536  NA 139 137  K 4.1 4.0  CL 105 106  CO2 29 28  BUN 13 10  CREATININE 0.87 0.93  GLUCOSE 111* 208*  CALCIUM 9.2 8.6*   No results for input(s): LABPT, INR in the last 72 hours.  VAC working , some drainage. good seal No results found.  Assessment/Plan: 1 Day Post-Op Procedure(s) (LRB): Right Below Knee Amputation Stump Revision, WOUND VAC application (Right) Plan:   Home Friday after VAC change. Office next week Tuesday.   Eldred MangesMark C Kaeson Kleinert 03/22/2016, 8:00 AM

## 2016-03-22 NOTE — Consult Note (Signed)
WOC Nurse wound consult note Reason for Consult: NPWT dressing to the stump revision site RLE Wound type: surgical  Pressure Injury POA: No Measurement: 12cm x 0.1cm x 0.5cm  Wound bed: sutures to attempt early approximation of wound edges Drainage (amount, consistency, odor) minimal  Periwound: intact   Working with CM on placement of home NPWT device for DC.    Planning dressing change in the am, prior to DC to home.    Yaseen Gilberg [facility] MSN, RN,CWOCN, CNS [phone]

## 2016-03-22 NOTE — Progress Notes (Signed)
Orthopedic Tech Progress Note Patient Details:  Gordon CousinFrederick Walker 1965-05-30 [id number]  Ortho Devices Type of Ortho Device: Knee Immobilizer   Saul FordyceJennifer C Kelleigh Skerritt 03/22/2016, 3:11 PM

## 2016-03-23 LAB — CBC WITH DIFFERENTIAL/PLATELET
Basophils Absolute: 0 10*3/uL (ref 0.0–0.1)
Basophils Relative: 0 %
Eosinophils Absolute: 0.1 10*3/uL (ref 0.0–0.7)
Eosinophils Relative: 1 %
HEMATOCRIT: 28.5 % — AB (ref 39.0–52.0)
Hemoglobin: 9.2 g/dL — ABNORMAL LOW (ref 13.0–17.0)
LYMPHS ABS: 1.3 10*3/uL (ref 0.7–4.0)
Lymphocytes Relative: 21 %
MCH: 27.7 pg (ref 26.0–34.0)
MCHC: 32.3 g/dL (ref 30.0–36.0)
MCV: 85.8 fL (ref 78.0–100.0)
MONO ABS: 0.7 10*3/uL (ref 0.1–1.0)
MONOS PCT: 12 %
NEUTROS PCT: 66 %
Neutro Abs: 4.1 10*3/uL (ref 1.7–7.7)
Platelets: 264 10*3/uL (ref 150–400)
RBC: 3.32 MIL/uL — ABNORMAL LOW (ref 4.22–5.81)
RDW: 14 % (ref 11.5–15.5)
WBC: 6.3 10*3/uL (ref 4.0–10.5)

## 2016-03-23 LAB — GLUCOSE, CAPILLARY
GLUCOSE-CAPILLARY: 56 mg/dL — AB (ref 65–99)
GLUCOSE-CAPILLARY: 142 mg/dL — AB (ref 65–99)
Glucose-Capillary: 117 mg/dL — ABNORMAL HIGH (ref 65–99)
Glucose-Capillary: 57 mg/dL — ABNORMAL LOW (ref 65–99)
Glucose-Capillary: 150 mg/dL — ABNORMAL HIGH (ref 65–99)

## 2016-03-23 LAB — BASIC METABOLIC PANEL
Anion gap: 4 — ABNORMAL LOW (ref 5–15)
BUN: 7 mg/dL (ref 6–20)
CHLORIDE: 104 mmol/L (ref 101–111)
CO2: 31 mmol/L (ref 22–32)
CREATININE: 0.93 mg/dL (ref 0.61–1.24)
Calcium: 8.6 mg/dL — ABNORMAL LOW (ref 8.9–10.3)
GFR calc non Af Amer: >60 mL/min (ref >60)
GLUCOSE: 161 mg/dL — AB (ref 65–99)
Potassium: 3.9 mmol/L (ref 3.5–5.1)
Sodium: 139 mmol/L (ref 135–145)

## 2016-03-23 MED ORDER — OXYCODONE-ACETAMINOPHEN 7.5-325 MG PO TABS: 1.0000 | ORAL_TABLET | Freq: Four times a day (QID) | ORAL | 0 refills | Status: DC | PRN

## 2016-03-23 MED ORDER — METHOCARBAMOL 500 MG PO TABS: 500.0000 mg | ORAL_TABLET | Freq: Four times a day (QID) | ORAL | 0 refills | Status: DC | PRN

## 2016-03-23 NOTE — Progress Notes (Signed)
Inpatient Diabetes Program Recommendations  AACE/ADA: New Consensus Statement on Inpatient Glycemic Control (2015)  Target Ranges:  Prepandial:   less than 140 mg/dL      Peak postprandial:   less than 180 mg/dL (1-2 hours)      Critically ill patients:  140 - 180 mg/dL   Lab Results  Component Value Date   GLUCAP 56 (L) 03/23/2016   HGBA1C 6.7 02/28/2016    Patient being discharged today.  This RN spoke at bedside with patient regarding self-management of diabetes.  1.  Discussed with patient the following (teach back and/or return demonstration):  Hypo/Hyperglycemia  Medications at D/C (what these are, why taking, when taking, how taking, common S.E.'s)  CBG monitoring, A1C  Why exercise is important   Carb modified diet/ how to count carb's  2.  Identified barriers and facilitators to self-management goals:  Patient with a very good knowledge of DM1 and home management needs, able to identify when CBG's are low and high (spouse present and she is also able to descirbe symptoms of low and high CBG's).  Patient and spouse able to explain how to treat hypoglycemia and hyperglycemia.  Patient and spouse aware of warning signs of DKA, able to teach back when to call MD, target CBG range, and medication regimen.  3.  Identified support systems:  Spouse  Thank you,  Kristine LineaKaren Elener Custodio, RN, MSN Diabetes Coordinator Inpatient Diabetes Program [phone] (Team Pager)

## 2016-03-23 NOTE — Consult Note (Signed)
WOC Nurse wound follow up Wound type:surgical  Measurement: 12cm x 0.1cm x 0.5cm  Wound bed: clean Drainage (amount, consistency, odor) serosanguinous  Periwound: intact, some maceration Dressing procedure/placement/frequency: Prepped skin with skin prep. Incision covered with Mepitel. 1 strip of black foam placed over site, 1pc of black foam used as mushroom to accommodate TRAC pad and prevent injury to the periwound skin. Seal at 100mmHG.  Patient tolerated well. CM working with orthopaedic surgeon to get home unit delivered for DC. Discussed with bedside nurse to clamp dressing tubing and hook to home machine once it arrives to the floor and to please teach patient/family how to change canister on home VAC unit.  Discussed POC with patient and bedside nurse.  Re consult if needed, will not follow at this time. Thanks  Ranald Alessio M.D.[employer]. [facility]in MSN, RN,CWOCN, CNS [phone])

## 2016-03-23 NOTE — Care Management Note (Signed)
Case Management Note  Patient Details  Name: Charletta CousinFrederick Pagel MRN: [id number] Date of Birth: [birth year]  Subjective/Objective:   51 yr old gentleman admitted with Right BKA wound dediscence, patient underwent Right below-knee amputation with revision with bone shortening and reclosure with incisional VAC                Action/Plan: Patient will go home with wound vac that will not be changed until he sees Dr. Ophelia CharterYates in the office on Tues. March 27, 2016. CM has faxed orders for wound vac to [facility]. Vac will be delivered to patient's room.   Expected Discharge Date:   03/23/16               Expected Discharge Plan:  Home/Self Care  In-House Referral:  NA  Discharge planning Services  CM Consult  Post Acute Care Choice:  Durable Medical Equipment Choice offered to:  NA  DME Arranged:  Vac DME Agency:  [facility]  HH Arranged:  NA HH Agency:  NA  Status of Service:  In process, will continue to follow  If discussed at Long Length of Stay Meetings, dates discussed:    Additional Comments:  Durenda GuthrieBrady, Camdon Saetern Naomi, RN 03/23/2016, 11:21 AM

## 2016-03-23 NOTE — Progress Notes (Signed)
Physical Therapy Treatment Patient Details Name: Gordon Walker MRN: [id number] DOB: 12-01-1965 Today's Date: 03/23/2016    History of Present Illness 50 y.o.malewith DM type 1, HTN, L transmetatarsal amputation, and PVD who underwent R BKA on 01/18/16 with a revision 03/21/16    PT Comments    Patient is making good progress with PT.  From a mobility standpoint anticipate patient will be ready for DC home when medically ready.      Follow Up Recommendations  No PT follow up     Equipment Recommendations  None recommended by PT    Recommendations for Other Services       Precautions / Restrictions Precautions Precautions: Fall Restrictions Weight Bearing Restrictions: Yes RLE Weight Bearing: Non weight bearing    Mobility  Bed Mobility Overal bed mobility: Modified Independent             General bed mobility comments: increased time/effort; no physical assist needed  Transfers Overall transfer level: Needs assistance Equipment used: Rolling walker (2 wheeled) Transfers: Sit to/from Stand Sit to Stand: Supervision         General transfer comment: supervision for safety; assistance with lines/drains but no physical assist for transfer  Ambulation/Gait Ambulation/Gait assistance: Supervision Ambulation Distance (Feet): 170 Feet Assistive device: Rolling walker (2 wheeled) Gait Pattern/deviations: Step-to pattern Gait velocity: decr   General Gait Details: steady gait and safe use of AD   Stairs Stairs: Yes   Stair Management: One rail Right;Step to pattern;With crutches Number of Stairs: 8 (limited by IV line) General stair comments: pt with good demonstration of technique/sequencing and good safety awareness; R hand rail and crutch used  Wheelchair Mobility    Modified Rankin (Stroke Patients Only)       Balance Overall balance assessment: Needs assistance Sitting-balance support: Feet supported;No upper extremity supported Sitting  balance-Leahy Scale: Good     Standing balance support: Bilateral upper extremity supported Standing balance-Leahy Scale: Poor Standing balance comment: Pt requires min outside support for balance                    Cognition Arousal/Alertness: Awake/alert Behavior During Therapy: WFL for tasks assessed/performed Overall Cognitive Status: Within Functional Limits for tasks assessed                      Exercises      General Comments        Pertinent Vitals/Pain Pain Assessment: Faces Faces Pain Scale: Hurts a little bit Pain Location: R residual limb in dependent position Pain Descriptors / Indicators: Sore;Tightness Pain Intervention(s): Monitored during session;Premedicated before session;Repositioned    Home Living                      Prior Function            PT Goals (current goals can now be found in the care plan section) Acute Rehab PT Goals Patient Stated Goal: be able to use a prosthesis PT Goal Formulation: With patient Time For Goal Achievement: 03/24/16 Potential to Achieve Goals: Good Progress towards PT goals: Progressing toward goals    Frequency    Min 6X/week      PT Plan Current plan remains appropriate    Co-evaluation             End of Session Equipment Utilized During Treatment: Gait belt Activity Tolerance: Patient tolerated treatment well Patient left: in bed;with call bell/phone within reach Nurse Communication: Mobility status PT  Visit Diagnosis: Difficulty in walking, not elsewhere classified (R26.2)     Time: [id number] PT Time Calculation (min) (ACUTE ONLY): 26 min  Charges:  $Gait Training: 8-22 mins $Therapeutic Activity: 8-22 mins                    G Codes:       Derek MoundKellyn R Artin Mceuen Everlynn Sagun, PTA Pager: [phone]   03/23/2016, 12:53 PM

## 2016-03-23 NOTE — Progress Notes (Signed)
    Subjective: 2 Days Post-Op Procedure(s) (LRB): Right Below Knee Amputation Stump Revision, WOUND VAC application (Right) Patient reports pain as mild.   Denies CP or SOB.  Voiding without difficulty. Positive flatus. Wound VAC was changed this afternoon. Objective: Vital signs in last 24 hours: Temp:  [98.7 F (37.1 C)] 98.7 F (37.1 C) (03/02 0458) Pulse Rate:  [77-98] 77 (03/02 0458) Resp:  [18] 18 (03/02 0458) BP: (109-137)/(64-79) 109/64 (03/02 0458) SpO2:  [99 %] 99 % (03/02 0458)  Intake/Output from previous day: 03/01 0701 - 03/02 0700 In: 520 [P.O.:520] Out: 1200 [Urine:1200] Intake/Output this shift: Total I/O In: 840 [P.O.:840] Out: 1050 [Urine:1050]  Labs:  Recent Labs  03/21/16 1254 03/22/16 0536 03/23/16 0429  HGB 10.8* 10.5* 9.2*    Recent Labs  03/22/16 0536 03/23/16 0429  WBC 6.4 6.3  RBC 3.79* 3.32*  HCT 32.7* 28.5*  PLT 301 264    Recent Labs  03/22/16 0536 03/23/16 0429  NA 137 139  K 4.0 3.9  CL 106 104  CO2 28 31  BUN 10 7  CREATININE 0.93 0.93  GLUCOSE 208* 161*  CALCIUM 8.6* 8.6*   No results for input(s): LABPT, INR in the last 72 hours.  Physical Exam: Pleasant male alert and oriented 3 and in no acute distress. Dressing clean dry and intact. Wound VAC intact.  Assessment/Plan: 2 Days Post-Op Procedure(s) (LRB): Right Below Knee Amputation Stump Revision, WOUND VAC application (Right) Patient doing well. We will discharge home this afternoon. Follow-up the office next week for recheck.  Zonia KiefJames Kelie Gainey  03/23/2016, 4:24 PM

## 2016-03-26 ENCOUNTER — Telehealth (INDEPENDENT_AMBULATORY_CARE_PROVIDER_SITE_OTHER): Payer: Self-pay | Admitting: Orthopaedic Surgery

## 2016-03-26 ENCOUNTER — Telehealth (INDEPENDENT_AMBULATORY_CARE_PROVIDER_SITE_OTHER): Payer: Self-pay | Admitting: Radiology

## 2016-03-26 MED ORDER — CEPHALEXIN 500 MG PO CAPS: 500.0000 mg | ORAL_CAPSULE | Freq: Two times a day (BID) | ORAL | 0 refills | Status: DC

## 2016-03-26 NOTE — Telephone Encounter (Signed)
Pt requests home health for wound vac change, stated they don't have any orders to come and do it.   [phone]

## 2016-03-26 NOTE — Telephone Encounter (Signed)
I called pt. He gets VAC off tomorrow and does not need VAC after that. If wound looks questionable Gordon Walker can have Gordon Walker or another Careers advisersurgeon look at it. More VAC will keep skin edges wet which can be bad. Patient understands. When VAC comes off go to dressing and compression ace wrap and when drainage minimal do light dressing and use his stump shrinker sock which he already has at home.  He can see me in 2 wks. thanks

## 2016-03-26 NOTE — Telephone Encounter (Signed)
Dr. Ophelia CharterYates spoke with Gordon FearingJames and is not wanting to do additional wound vac, did send in keflex 500 mg 1 po bid, 10 day supply. Pravena is incisional wound vac intended for just short term use post operational that no additional vac is needed.

## 2016-03-26 NOTE — Telephone Encounter (Signed)
Mrs. Gordon Walker is calling states that Dr. Ophelia CharterYates was going send in an rx for antibiotics and they have not rec'd the rx at the pharm.  [facility]

## 2016-03-26 NOTE — Telephone Encounter (Signed)
I called patient on home number-thanks. Please let me know if you need me to do anything further.

## 2016-03-26 NOTE — Telephone Encounter (Signed)
Can you please see if Dr. Lajoyce Cornersuda can advise since Dr. Ophelia CharterYates is out of the office this week?

## 2016-03-26 NOTE — Telephone Encounter (Signed)
I called and spoke with patient. He is extremely concerned because he did not get the wound vac until late Friday. He states that he waited all day, the RN case manager for day shift left without saying anything to him, and the evening RN case manager had to get all of his updated insurance info. He was told that they did not use Pravena wound vacs and had to wait for someone from [facility] to bring [place] one [place]. He was not able to leave the hospital until after 7 waiting on the vac. I did let him know antibiotics were sent in. I told him I would try and reach Dr. Ophelia CharterYates and find out about the vac and if he should keep it on longer.  I did let him know that I would call him back either way.   He also inquired about a disability form he dropped off last week. I told him I would check on it prior to his appt tomorrow

## 2016-03-27 ENCOUNTER — Ambulatory Visit (INDEPENDENT_AMBULATORY_CARE_PROVIDER_SITE_OTHER): Payer: BLUE CROSS/BLUE SHIELD | Admitting: Surgery

## 2016-03-27 ENCOUNTER — Ambulatory Visit: Payer: BLUE CROSS/BLUE SHIELD | Admitting: Endocrinology

## 2016-03-27 ENCOUNTER — Encounter (INDEPENDENT_AMBULATORY_CARE_PROVIDER_SITE_OTHER): Payer: Self-pay | Admitting: Surgery

## 2016-03-27 VITALS — Ht 72.0 in | Wt 160.0 lb

## 2016-03-27 DIAGNOSIS — Z89511 Acquired absence of right leg below knee: Secondary | ICD-10-CM

## 2016-03-27 DIAGNOSIS — Z89512 Acquired absence of left leg below knee: Secondary | ICD-10-CM

## 2016-03-27 NOTE — Telephone Encounter (Signed)
Taken care of

## 2016-03-27 NOTE — Progress Notes (Signed)
Post-Op Visit Note   Patient: Charletta CousinFrederick Dillavou           Date of Birth: [birth year]           MRN: [id number] Visit Date: 03/27/2016 PCP: Darrow BussingKOIRALA,DIBAS, MD   Assessment & Plan:  Chief Complaint:  Chief Complaint  Patient presents with  . Right Knee - Routine Post Op   Visit Diagnoses:  1. S/P bilateral BKA (below knee amputation) (HCC)     Plan: Per Dr. Ophelia CharterYates patient will discontinue the wound VAC. Do daily dressing changes and keep the incision clean. Dressing applied today with Xeroform 4 x 4 gauze and Ace wrap. His insurance will allow 2 visits per week from home health and will have RN check his wound. Continue Keflex prescription. Follow-up the office in 2 weeks for recheck. If he begins to have any increased pain, swelling, drainage he should contact us immediately.  Follow-Up Instructions: Return in about 2 weeks (around 04/10/2016).   Orders:  No orders of the defined types were placed in this encounter.  No orders of the defined types were placed in this encounter.  HPI Patient returns status post right below knee amputation stump revision, wound vac application on 03/21/2016.  He is 6 days post op. RX for Keflex was sent to pharmacy yesterday. The patient states that he is picking it up today after he leaves the office. Wound vac intact. Patient ambulates with walker.   Imaging: No results found.  PMFS History: Patient Active Problem List   Diagnosis Date Noted  . Wound dehiscence 03/21/2016  . Wound dehiscence, surgical   . Acute blood loss anemia 01/20/2016  . Status post below knee amputation of right lower extremity (HCC) 01/20/2016  . Hyponatremia 01/17/2016  . AKI (acute kidney injury) (HCC) 01/17/2016  . Hyperbilirubinemia 01/17/2016  . Bacteremia due to Escherichia coli 01/17/2016  . Sepsis (HCC) 01/17/2016  . DKA (diabetic ketoacidoses) (HCC) 01/15/2016  . Gas gangrene of foot (HCC)   . Foot amputation status, right (HCC) 01/05/2016  . Ulcer of toe  of right foot, with necrosis of bone (HCC) 12/27/2015  . Controlled diabetes mellitus with diabetic peripheral angiopathy and gangrene (HCC)   . 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 Transaminitis 01/07/2015  . Osteomyelitis of left foot (HCC) 01/06/2015  . Uncontrolled diabetes mellitus with foot ulcer, with long-term current use of insulin (HCC) 01/06/2015  . Anemia 01/06/2015  . Osteomyelitis (HCC) 01/05/2015   Past Medical History:  Diagnosis Date  . Complication of anesthesia    anesthesia complication 01/18/16; pt unaware what happened   . Dehiscence of amputation stump (HCC)    right  . Diabetes mellitus without complication (HCC)    Type 1  . Heart murmur    "years ago"  . Hypertension    "years ago"  . 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, with long-term current use of insulin (HCC)     Family History  Problem Relation Age of Onset  . Hypertension Mother   . Diabetes Mother   . Cancer Mother   . Prostate cancer Father     Past Surgical History:  Procedure Laterality Date  . AMPUTATION Left 01/07/2015   Procedure: Left Foot 1st Ray Amputation;  Surgeon: Nadara MustardMarcus Duda V, MD;  [facility];  Service: Orthopedics;  Laterality: Left;  . AMPUTATION Left 07/15/2015   Procedure: Left Transmetatarsal Amputation;  Surgeon: Nadara Mustard, MD;  Location[facility];  Service: Orthopedics;  Laterality: Left;  . AMPUTATION Right 12/30/2015   Procedure: 5th Ray Amputation Right Foot;  Surgeon: Nadara Mustard, MD;  Location: [facility] OR;  Service: Orthopedics;  Laterality: Right;  . AMPUTATION Right 01/15/2016   Procedure: GUILLOTINE AMPUTATION  ABOVE THE ANKLE AMPUTATION;  Surgeon: Eldred Manges, MD;  Location: [facility] OR;  Service: Orthopedics;  Laterality: Right;  . AMPUTATION Right 01/18/2016   Procedure: AMPUTATION BELOW KNEE;  Surgeon: Eldred Manges, MD;  Location: [facility] OR;   Service: Orthopedics;  Laterality: Right;  . STUMP REVISION Right 03/21/2016   Procedure: Right Below Knee Amputation Stump Revision, WOUND VAC application;  Surgeon: Eldred Manges, MD;  Location: [facility] OR;  Service: Orthopedics;  Laterality: Right;   Social History   Occupational History  . Not on file.   Social History Main Topics  . Smoking status: Never Smoker  . Smokeless tobacco: Never Used  . Alcohol use No  . Drug use: No  . Sexual activity: Not on file   Exam: Pleasant white male alert and oriented in no acute distress. Stump wound is healing well at this point. Does have some slight serosanguineous drainage but not too bad. Some swelling although not extreme. No gross signs of infection.

## 2016-03-29 ENCOUNTER — Telehealth (INDEPENDENT_AMBULATORY_CARE_PROVIDER_SITE_OTHER): Payer: Self-pay | Admitting: Orthopaedic Surgery

## 2016-03-29 NOTE — Telephone Encounter (Signed)
Vernona RiegerLaura calling to verify they are going to do wound care 2x a week for 2 weeks, 1 x week for 1 week and 1 time for discharge. She thinks he will do great. He and his wife are doing great with dressing changes. She will call if they need anything further.

## 2016-03-29 NOTE — Telephone Encounter (Signed)
[facility] calling for a verbal order for home health, Vernona RiegerLaura- [phone]

## 2016-03-30 ENCOUNTER — Ambulatory Visit (INDEPENDENT_AMBULATORY_CARE_PROVIDER_SITE_OTHER): Payer: BLUE CROSS/BLUE SHIELD | Admitting: Orthopedic Surgery

## 2016-03-30 DIAGNOSIS — Z89511 Acquired absence of right leg below knee: Secondary | ICD-10-CM

## 2016-03-30 NOTE — Progress Notes (Signed)
Office Visit Note   Patient: Gordon Walker           Date of Birth: May 01, 1965           MRN: [id number] Visit Date: 03/30/2016              Requested by: Darrow Bussingibas Koirala, MD [address] [place], [place] [address] PCP: Darrow BussingKOIRALA,DIBAS, MD  No chief complaint on file.   HPI: Patient is a 51 year old gentleman seen as a work in today for a concern of tissue necrosis along right below knee amputation incision. No injury. No dehiscence. Has been doing daily dressing changes with a petrolatum gauze and ace wrap. Wearing stump protector from [employer].     Assessment & Plan: Visit Diagnoses:  1. Status post below knee amputation of right lower extremity (HCC)     Plan: Reassurance provided. continue with current wound care. Will follow up with Yates as scheduled.  Follow-Up Instructions: Return for as scheduled.   Ortho Exam Right below knee amputation is healing well medially. Laterally has open a little. No depth. Does have exudative tissue in wound bed laterally. There is some eschar as well a length of 2 cm x 1 cm. No drainage. No odor. No surrounding erythema. No sign of infection.   Imaging: No results found.  Labs: Lab Results  Component Value Date   HGBA1C 6.7 02/28/2016   HGBA1C 8.2 12/28/2015   HGBA1C 13.5 (H) 01/06/2015   REPTSTATUS 01/19/2016 FINAL 01/17/2016   CULT MRSA DETECTED 01/17/2016   LABORGA ESCHERICHIA COLI 01/14/2016    Orders:  No orders of the defined types were placed in this encounter.  No orders of the defined types were placed in this encounter.    Procedures: No procedures performed  Clinical Data: No additional findings.  Subjective: Review of Systems  Constitutional: Negative for chills and fever.  Cardiovascular: Negative for leg swelling.  Skin: Negative for color change.    Objective: Vital Signs: There were no vitals taken for this visit.  Specialty Comments:  No specialty comments available.  PMFS  History: Patient Active Problem List   Diagnosis Date Noted  . Wound dehiscence, surgical   . Acute blood loss anemia 01/20/2016  . Status post below knee amputation of right lower extremity (HCC) 01/20/2016  . Hyponatremia 01/17/2016  . AKI (acute kidney injury) (HCC) 01/17/2016  . Hyperbilirubinemia 01/17/2016  . Bacteremia due to Escherichia coli 01/17/2016  . Sepsis (HCC) 01/17/2016  . DKA (diabetic ketoacidoses) (HCC) 01/15/2016  . Controlled diabetes mellitus with diabetic peripheral angiopathy and gangrene (HCC)   . 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 Transaminitis 01/07/2015  . Osteomyelitis of left foot (HCC) 01/06/2015  . Uncontrolled diabetes mellitus with foot ulcer, with long-term current use of insulin (HCC) 01/06/2015  . Anemia 01/06/2015  . Osteomyelitis (HCC) 01/05/2015   Past Medical History:  Diagnosis Date  . Complication of anesthesia    anesthesia complication 01/18/16; pt unaware what happened   . Dehiscence of amputation stump (HCC)    right  . Diabetes mellitus without complication (HCC)    Type 1  . Heart murmur    "years ago"  . Hypertension    "years ago"  . 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, with long-term current use of insulin (HCC)     Family History  Problem Relation Age of Onset  .  Hypertension Mother   . Diabetes Mother   . Cancer Mother   . Prostate cancer Father     Past Surgical History:  Procedure Laterality Date  . AMPUTATION Left 01/07/2015   Procedure: Left Foot 1st Ray Amputation;  Surgeon: Nadara Mustard, MD;  Location[facility];  Service: Orthopedics;  Laterality: Left;  . AMPUTATION Left 07/15/2015   Procedure: Left Transmetatarsal Amputation;  Surgeon: Nadara Mustard, MD;  Location: [facility] OR;  Service: Orthopedics;  Laterality: Left;  . AMPUTATION Right 12/30/2015   Procedure: 5th Ray Amputation Right Foot;   Surgeon: Nadara Mustard, MD;  Location[facility] OR;  Service: Orthopedics;  Laterality: Right;  . AMPUTATION Right 01/15/2016   Procedure: GUILLOTINE AMPUTATION  ABOVE THE ANKLE AMPUTATION;  Surgeon: Eldred Manges, MD;  Location: [facility] OR;  Service: Orthopedics;  Laterality: Right;  . AMPUTATION Right 01/18/2016   Procedure: AMPUTATION BELOW KNEE;  Surgeon: Eldred Manges, MD;  Location: [facility] OR;  Service: Orthopedics;  Laterality: Right;  . STUMP REVISION Right 03/21/2016   Procedure: Right Below Knee Amputation Stump Revision, WOUND VAC application;  Surgeon: Eldred Manges, MD;  Location: [facility] OR;  Service: Orthopedics;  Laterality: Right;   Social History   Occupational History  . Not on file.   Social History Main Topics  . Smoking status: Never Smoker  . Smokeless tobacco: Never Used  . Alcohol use No  . Drug use: No  . Sexual activity: Not on file

## 2016-04-01 NOTE — Discharge Summary (Signed)
Patient ID: Gordon Walker MRN: [id number] DOB/AGE: 1965-02-27 51 y.o.  Admit date: 03/21/2016 Discharge date: 04/01/2016  Admission Diagnoses:  Active Problems:   Wound dehiscence, surgical   Discharge Diagnoses:  Active Problems:   Wound dehiscence, surgical  status post Procedure(s): Right Below Knee Amputation Stump Revision, WOUND VAC application  Past Medical History:  Diagnosis Date  . Complication of anesthesia    anesthesia complication 01/18/16; pt unaware what happened   . Dehiscence of amputation stump (HCC)    right  . Diabetes mellitus without complication (HCC)    Type 1  . Heart murmur    "years ago"  . Hypertension    "years ago"  . 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, with long-term current use of insulin (HCC)     Surgeries: Procedure(s): Right Below Knee Amputation Stump Revision, WOUND VAC application on 03/21/2016   Consultants:   Discharged Condition: Improved  Hospital Course: Gordon Walker is an 51 y.o. male who was admitted 03/21/2016 for operative treatment of BKA stump wound dehiscence. Patient failed conservative treatments (please see the history and physical for the specifics) and had severe unremitting pain that affects sleep, daily activities and work/hobbies. After pre-op clearance, the patient was taken to the operating room on 03/21/2016 and underwent  Procedure(s): Right Below Knee Amputation Stump Revision, WOUND VAC application.    Patient was given perioperative antibiotics:  Anti-infectives    Start     Dose/Rate Route Frequency Ordered Stop   03/22/16 0600  ceFAZolin (ANCEF) IVPB 2g/100 mL premix     2 g 200 mL/hr over 30 Minutes Intravenous On call to O.R. 03/21/16 1246 03/21/16 1527   03/21/16 2200  ceFAZolin (ANCEF) IVPB 1 g/50 mL premix     1 g 100 mL/hr over 30 Minutes Intravenous Every 8 hours 03/21/16 1813 03/22/16 1450       Patient was given sequential compression  devices and early ambulation to prevent DVT.   Patient benefited maximally from hospital stay and there were no complications. At the time of discharge, the patient was urinating/moving their bowels without difficulty, tolerating a regular diet, pain is controlled with oral pain medications and they have been cleared by PT/OT.   Recent vital signs: No data found.    Recent laboratory studies: No results for input(s): WBC, HGB, HCT, PLT, NA, K, CL, CO2, BUN, CREATININE, GLUCOSE, INR, CALCIUM in the last 72 hours.  Invalid input(s): PT, 2   Discharge Medications:   Allergies as of 03/23/2016   No Known Allergies     Medication List    TAKE these medications   aspirin EC 81 MG tablet Take 81 mg by mouth daily.   atorvastatin 20 MG tablet Commonly known as:  LIPITOR Take 1 tablet (20 mg total) by mouth daily.   ferrous sulfate 325 (65 FE) MG tablet Take 1 tablet (325 mg total) by mouth 2 (two) times daily with a meal.   hydroxypropyl methylcellulose / hypromellose 2.5 % ophthalmic solution Commonly known as:  ISOPTO TEARS / GONIOVISC Place 1 drop into both eyes 4 (four) times daily as needed for dry eyes.   insulin aspart 100 UNIT/ML FlexPen Commonly known as:  NOVOLOG FLEXPEN Inject 7 Units into the skin 3 (three) times daily with meals. And pen needles 3/day What changed:  how much to take  additional instructions   insulin glargine 100 UNIT/ML injection Commonly known as:  LANTUS Inject 0.15 mLs (15 Units total) into the  skin at bedtime. What changed:  how much to take  additional instructions   lisinopril 10 MG tablet Commonly known as:  PRINIVIL,ZESTRIL Take 1 tablet (10 mg total) by mouth daily.   methocarbamol 500 MG tablet Commonly known as:  ROBAXIN Take 1 tablet (500 mg total) by mouth every 6 (six) hours as needed for muscle spasms.   multivitamin with minerals Tabs tablet Take 1 tablet by mouth daily.   oxyCODONE-acetaminophen 7.5-325 MG  tablet Commonly known as:  PERCOCET Take 1 tablet by mouth every 6 (six) hours as needed for severe pain.       Diagnostic Studies: No results found.    Follow-up Information    Eldred MangesMark C Yates, MD. Schedule an appointment as soon as possible for a visit today.   Specialty:  Orthopedic Surgery Why:  call to schedule return office visit next week.  Contact information: [address] [place] [address] [phone]           Discharge Plan:  discharge to home  Disposition:     Signed: Zonia KiefJames Maisie Hauser 04/01/2016, 11:15 PM

## 2016-04-06 ENCOUNTER — Ambulatory Visit (INDEPENDENT_AMBULATORY_CARE_PROVIDER_SITE_OTHER): Payer: BLUE CROSS/BLUE SHIELD | Admitting: Orthopaedic Surgery

## 2016-04-06 ENCOUNTER — Encounter (INDEPENDENT_AMBULATORY_CARE_PROVIDER_SITE_OTHER): Payer: Self-pay | Admitting: Orthopaedic Surgery

## 2016-04-06 DIAGNOSIS — M868X7 Other osteomyelitis, ankle and foot: Secondary | ICD-10-CM

## 2016-04-06 NOTE — Progress Notes (Signed)
Post-Op Visit Note   Patient: Gordon Walker           Date of Birth: 03/09/1965           MRN: [id number] Visit Date: 04/06/2016 PCP: Darrow BussingKOIRALA,DIBAS, MD   Assessment & Plan:  Chief Complaint:  Chief Complaint  Patient presents with  . Right Knee - Wound Check   Visit Diagnoses:  1. Other osteomyelitis of left foot (HCC)         Posterior right BKA revision for dehiscence.  Plan: Recheck 2 weeks. Nitroglycerin patches, silver to the area that of opened it continue to wash it daily keep clean and dry keep it elevated for the swelling. Wound recheck 2 weeks.  Follow-Up Instructions: Return in about 2 weeks (around 04/20/2016).   Orders:  No orders of the defined types were placed in this encounter.  No orders of the defined types were placed in this encounter.  HPI Patient is worked in today for a wound check. He is status post Right BKA stump revision and wound vac application on 03/21/2016.  His wife was redressing his stump today and noticed that the skin on the bottom was peeling off and looked green.  He has decreased drainage, has no pain, and finished his antibiotics yesterday. They were concerned with the coloring of the skin and came in for wound check.   EXAM--- patient has some peeling skin at stump. 1 cm x 3 cm area of eschar is present in the skin. Ears he has 2-3 mm of open. Will apply some silver sulfadiazine to the area that has had some early partial dehiscence. He has slight serous drainage no tendon is exposed. Imaging: No results found.  PMFS History: Patient Active Problem List   Diagnosis Date Noted  . Wound dehiscence, surgical   . Acute blood loss anemia 01/20/2016  . Status post below knee amputation of right lower extremity (HCC) 01/20/2016  . Hyponatremia 01/17/2016  . AKI (acute kidney injury) (HCC) 01/17/2016  . Hyperbilirubinemia 01/17/2016  . Bacteremia due to Escherichia coli 01/17/2016  . Sepsis (HCC) 01/17/2016  . DKA (diabetic  ketoacidoses) (HCC) 01/15/2016  . Controlled diabetes mellitus with diabetic peripheral angiopathy and gangrene (HCC)   . 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 Transaminitis 01/07/2015  . Osteomyelitis of left foot (HCC) 01/06/2015  . Uncontrolled diabetes mellitus with foot ulcer, with long-term current use of insulin (HCC) 01/06/2015  . Anemia 01/06/2015  . Osteomyelitis (HCC) 01/05/2015   Past Medical History:  Diagnosis Date  . Complication of anesthesia    anesthesia complication 01/18/16; pt unaware what happened   . Dehiscence of amputation stump (HCC)    right  . Diabetes mellitus without complication (HCC)    Type 1  . Heart murmur    "years ago"  . Hypertension    "years ago"  . 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, with long-term current use of insulin (HCC)     Family History  Problem Relation Age of Onset  . Hypertension Mother   . Diabetes Mother   . Cancer Mother   . Prostate cancer Father     Past Surgical History:  Procedure Laterality Date  . AMPUTATION Left 01/07/2015   Procedure: Left Foot 1st Ray Amputation;  Surgeon: Nadara MustardMarcus Duda V, MD;  [facility];  Service: Orthopedics;  Laterality: Left;  . AMPUTATION Left 07/15/2015  Procedure: Left Transmetatarsal Amputation;  Surgeon: Nadara Mustard, MD;  Location[facility];  Service: Orthopedics;  Laterality: Left;  . AMPUTATION Right 12/30/2015   Procedure: 5th Ray Amputation Right Foot;  Surgeon: Nadara Mustard, MD;  Location: [facility] OR;  Service: Orthopedics;  Laterality: Right;  . AMPUTATION Right 01/15/2016   Procedure: GUILLOTINE AMPUTATION  ABOVE THE ANKLE AMPUTATION;  Surgeon: Eldred Manges, MD;  Location: [facility] OR;  Service: Orthopedics;  Laterality: Right;  . AMPUTATION Right 01/18/2016   Procedure: AMPUTATION BELOW KNEE;  Surgeon: Eldred Manges, MD;  Location: [facility] OR;  Service: Orthopedics;   Laterality: Right;  . STUMP REVISION Right 03/21/2016   Procedure: Right Below Knee Amputation Stump Revision, WOUND VAC application;  Surgeon: Eldred Manges, MD;  Location: [facility] OR;  Service: Orthopedics;  Laterality: Right;   Social History   Occupational History  . Not on file.   Social History Main Topics  . Smoking status: Never Smoker  . Smokeless tobacco: Never Used  . Alcohol use No  . Drug use: No  . Sexual activity: Not on file

## 2016-04-10 ENCOUNTER — Ambulatory Visit (INDEPENDENT_AMBULATORY_CARE_PROVIDER_SITE_OTHER): Payer: BLUE CROSS/BLUE SHIELD | Admitting: Orthopaedic Surgery

## 2016-04-10 ENCOUNTER — Telehealth (INDEPENDENT_AMBULATORY_CARE_PROVIDER_SITE_OTHER): Payer: Self-pay | Admitting: Orthopaedic Surgery

## 2016-04-10 NOTE — Telephone Encounter (Signed)
Please advise 

## 2016-04-10 NOTE — Telephone Encounter (Signed)
I called discussed. Can stop zeroform. Using silver plus nitro patch. Wash and dry then dressing. Some edge necrosis with eschar.

## 2016-04-10 NOTE — Telephone Encounter (Signed)
Caryl AspKellie from [facility] called asking if the patients wound care had changed? CB # [phone] Also said you can leave a voicemail on her phone, its confidential. Thank you!

## 2016-04-17 ENCOUNTER — Encounter: Payer: Self-pay | Admitting: Endocrinology

## 2016-04-17 ENCOUNTER — Ambulatory Visit (INDEPENDENT_AMBULATORY_CARE_PROVIDER_SITE_OTHER): Payer: BLUE CROSS/BLUE SHIELD | Admitting: Endocrinology

## 2016-04-17 VITALS — BP 134/72 | HR 90 | Ht 72.0 in | Wt 168.0 lb

## 2016-04-17 DIAGNOSIS — E1052 Type 1 diabetes mellitus with diabetic peripheral angiopathy with gangrene: Secondary | ICD-10-CM

## 2016-04-17 MED ORDER — INSULIN ASPART 100 UNIT/ML FLEXPEN: PEN_INJECTOR | SUBCUTANEOUS | 11 refills | Status: DC

## 2016-04-17 MED ORDER — INSULIN GLARGINE 100 UNIT/ML ~~LOC~~ SOLN: 14.0000 [IU] | Freq: Every day | SUBCUTANEOUS | 11 refills | Status: DC

## 2016-04-17 NOTE — Patient Instructions (Addendum)
check your blood sugar 4 times a day: before the 3 meals, and at bedtime.  also check if you have symptoms of your blood sugar being too high or too low.  please keep a record of the readings and bring it to your next appointment here (or you can bring the meter itself).  You can write it on any piece of paper.  please call us sooner if your blood sugar goes below 70, or if you have a lot of readings over 200. For now, please take: Lantus, 14 units at bedtime, and:  novolog: 3 times a day (just before each meal), 7-7-8 units.  You can take 1 extra unit for a high blood sugar (300), or for a larger than usual meal.   Please come back for a follow-up appointment in 2 months.

## 2016-04-17 NOTE — Progress Notes (Signed)
Subjective:    Patient ID: Gordon Walker, male    DOB: 1965/05/08, 51 y.o.   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  Therapy: insulin since dx DKA: never Severe hypoglycemia: last episode was Sept, 2017.  Pancreatitis: never Other: he takes multiple daily injections.   Interval history:  He has mild hypoglycemia approx twice a week.  This happens in the afternoon, or fasting.  no cbg record, but states cbg's vary from 60-200's.  It is highest at HS.  Past Medical History:  Diagnosis Date  . Complication of anesthesia    anesthesia complication 01/18/16; pt unaware what happened   . Dehiscence of amputation stump (HCC)    right  . Diabetes mellitus without complication (HCC)    Type 1  . Heart murmur    "years ago"  . Hypertension    "years ago"  . 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, with long-term current use of insulin [facility]     Past Surgical History:  Procedure Laterality Date  . AMPUTATION Left 01/07/2015   Procedure: Left Foot 1st Ray Amputation;  Surgeon: Nadara Mustard, MD;  Location: [facility] OR;  Service: Orthopedics;  Laterality: Left;  . AMPUTATION Left 07/15/2015   Procedure: Left Transmetatarsal Amputation;  Surgeon: Nadara Mustard, MD;  Location: [facility] OR;  Service: Orthopedics;  Laterality: Left;  . AMPUTATION Right 12/30/2015   Procedure: 5th Ray Amputation Right Foot;  Surgeon: Nadara Mustard, MD;  Location: [facility] OR;  Service: Orthopedics;  Laterality: Right;  . AMPUTATION Right 01/15/2016   Procedure: GUILLOTINE AMPUTATION  ABOVE THE ANKLE AMPUTATION;  Surgeon: Eldred Manges, MD;  Location: [facility] OR;  Service: Orthopedics;  Laterality: Right;  . AMPUTATION Right 01/18/2016   Procedure: AMPUTATION BELOW KNEE;  Surgeon: Eldred Manges, MD;   Location: [facility] OR;  Service: Orthopedics;  Laterality: Right;  . STUMP REVISION Right 03/21/2016   Procedure: Right Below Knee Amputation Stump Revision, WOUND VAC application;  Surgeon: Eldred Manges, MD;  Location: [facility] OR;  Service: Orthopedics;  Laterality: Right;    Social History   Social History  . Marital status: Married    Spouse name: N/A  . Number of children: N/A  . Years of education: N/A   Occupational History  . Not on file.   Social History Main Topics  . Smoking status: Never Smoker  . Smokeless tobacco: Never Used  . Alcohol use No  . Drug use: No  . Sexual activity: Not on file   Other Topics Concern  . Not on file   Social History Narrative  . No narrative on file    Current Outpatient Prescriptions on File Prior to Visit  Medication Sig Dispense Refill  . aspirin EC 81 MG tablet Take 81 mg by mouth daily.    Marland Kitchen atorvastatin (LIPITOR) 20 MG tablet Take 1 tablet (20 mg total) by mouth daily. 30 tablet 0  . ferrous sulfate 325 (65 FE) MG tablet Take 1 tablet (325 mg total) by mouth 2 (two) times daily with a meal. 60 tablet 0  . hydroxypropyl methylcellulose / hypromellose (ISOPTO TEARS / GONIOVISC) 2.5 % ophthalmic solution Place 1 drop into both eyes 4 (four) times daily as needed for dry eyes.    Marland Kitchen lisinopril (PRINIVIL,ZESTRIL) 10 MG tablet Take 1 tablet (10  mg total) by mouth daily. 30 tablet 0  . methocarbamol (ROBAXIN) 500 MG tablet Take 1 tablet (500 mg total) by mouth every 6 (six) hours as needed for muscle spasms. 60 tablet 0  . Multiple Vitamin (MULTIVITAMIN WITH MINERALS) TABS tablet Take 1 tablet by mouth daily.    Marland Kitchen. oxyCODONE-acetaminophen (PERCOCET) 7.5-325 MG tablet Take 1 tablet by mouth every 6 (six) hours as needed for severe pain. 60 tablet 0   No current facility-administered medications on file prior to visit.     No Known Allergies  Family History  Problem Relation Age of Onset  . Hypertension Mother   . Diabetes Mother   . Cancer  Mother   . Prostate cancer Father     BP 134/72   Pulse 90   Ht 6' (1.829 m)   Wt 168 lb (76.2 kg)   SpO2 95%   BMI 22.78 kg/m    Review of Systems Denies LOC    Objective:   Physical Exam VITAL SIGNS:  See vs page GENERAL: no distress.   Pulses: left dorsalis pedis intact.   MSK:  Left foot transmetatarsal amputation, well-healed. Right BKA.  CV: trace left leg edema Skin: healed ulcer at the lateral aspect of the left foot.  normal color and temp on the left foot. Neuro: sensation is intact to touch on the left foot, but severely decreased from normal.        Assessment & Plan:  Insulin-requiring type 2 DM, withretinopathy: The pattern of his cbg's indicates he needs some adjustment in his therapy.  Patient is advised the following: Patient Instructions  check your blood sugar 4 times a day: before the 3 meals, and at bedtime.  also check if you have symptoms of your blood sugar being too high or too low.  please keep a record of the readings and bring it to your next appointment here (or you can bring the meter itself).  You can write it on any piece of paper.  please call us sooner if your blood sugar goes below 70, or if you have a lot of readings over 200. For now, please take: Lantus, 14 units at bedtime, and:  novolog: 3 times a day (just before each meal), 7-7-8 units.  You can take 1 extra unit for a high blood sugar (300), or for a larger than usual meal.   Please come back for a follow-up appointment in 2 months.

## 2016-04-18 ENCOUNTER — Ambulatory Visit (INDEPENDENT_AMBULATORY_CARE_PROVIDER_SITE_OTHER): Payer: BLUE CROSS/BLUE SHIELD | Admitting: Orthopaedic Surgery

## 2016-04-18 ENCOUNTER — Encounter (INDEPENDENT_AMBULATORY_CARE_PROVIDER_SITE_OTHER): Payer: Self-pay | Admitting: Orthopaedic Surgery

## 2016-04-18 DIAGNOSIS — Z89511 Acquired absence of right leg below knee: Secondary | ICD-10-CM

## 2016-04-18 NOTE — Progress Notes (Signed)
Post-Op Visit Note   Patient: Gordon Walker           Date of Birth: 02/23/1965           MRN: [id number] Visit Date: 04/18/2016 PCP: Gordon Walker,DIBAS, Gordon Walker   Assessment & Plan:  Chief Complaint:  Chief Complaint  Patient presents with  . Right Leg - Routine Post Op   Visit Diagnoses:  1. Status post below knee amputation of right lower extremity (HCC)     Plan: resume desk job tomorrow, work slip given.   Follow-Up Instructions: Return in about 1 month (around 05/19/2016).  Exam: No increase in size of the eschar. No cellulitis no drainage. Capillary refill. Is good. Remaining sutures are harvested. Orders:  No orders of the defined types were placed in this encounter.  No orders of the defined types were placed in this encounter.   Imaging: No results found.  PMFS History: Patient Active Problem List   Diagnosis Date Noted  . Wound dehiscence, surgical   . Acute blood loss anemia 01/20/2016  . Status post below knee amputation of right lower extremity (HCC) 01/20/2016  . Hyponatremia 01/17/2016  . AKI (acute kidney injury) (HCC) 01/17/2016  . Hyperbilirubinemia 01/17/2016  . Bacteremia due to Escherichia coli 01/17/2016  . Sepsis (HCC) 01/17/2016  . DKA (diabetic ketoacidoses) (HCC) 01/15/2016  . Controlled diabetes mellitus with diabetic peripheral angiopathy and gangrene (HCC)   . 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 Transaminitis 01/07/2015  . Osteomyelitis of left foot (HCC) 01/06/2015  . Uncontrolled diabetes mellitus with foot ulcer, with long-term current use of insulin (HCC) 01/06/2015  . Anemia 01/06/2015  . Osteomyelitis (HCC) 01/05/2015   Past Medical History:  Diagnosis Date  . Complication of anesthesia    anesthesia complication 01/18/16; pt unaware what happened   . Dehiscence of amputation stump (HCC)    right  . Diabetes mellitus without complication  (HCC)    Type 1  . Heart murmur    "years ago"  . Hypertension    "years ago"  . 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, with long-term current use of insulin (HCC)     Family History  Problem Relation Age of Onset  . Hypertension Mother   . Diabetes Mother   . Cancer Mother   . Prostate cancer Father     Past Surgical History:  Procedure Laterality Date  . AMPUTATION Left 01/07/2015   Procedure: Left Foot 1st Ray Amputation;  Surgeon: Nadara MustardMarcus Duda V, Gordon Walker;  [facility];  Service: Orthopedics;  Laterality: Left;  . AMPUTATION Left 07/15/2015   Procedure: Left Transmetatarsal Amputation;  Surgeon: Nadara MustardMarcus V Duda, Gordon Walker;  Location: [facility] OR;  Service: Orthopedics;  Laterality: Left;  . AMPUTATION Right 12/30/2015   Procedure: 5th Ray Amputation Right Foot;  Surgeon: Nadara MustardMarcus V Duda, Gordon Walker;  [facility];  Service: Orthopedics;  Laterality: Right;  . AMPUTATION Right 01/15/2016   Procedure: GUILLOTINE AMPUTATION  ABOVE THE ANKLE AMPUTATION;  Surgeon: Eldred MangesMark C Avie Checo, Gordon Walker;  Location: [facility] OR;  Service: Orthopedics;  Laterality: Right;  . AMPUTATION Right 01/18/2016   Procedure: AMPUTATION BELOW KNEE;  Surgeon: Eldred MangesMark C Chanetta Moosman, Gordon Walker;  Location: [facility] OR;  Service: Orthopedics;  Laterality: Right;  . STUMP REVISION Right 03/21/2016   Procedure: Right Below Knee Amputation Stump Revision, WOUND VAC application;  Surgeon: Eldred MangesMark C Ruberta Holck, Gordon Walker;  Location: [facility] OR;  Service: Orthopedics;  Laterality: Right;   Social History   Occupational History  . Not on file.   Social History Main Topics  . Smoking status: Never Smoker  . Smokeless tobacco: Never Used  . Alcohol use No  . Drug use: No  . Sexual activity: Not on file

## 2016-05-16 ENCOUNTER — Encounter (INDEPENDENT_AMBULATORY_CARE_PROVIDER_SITE_OTHER): Payer: Self-pay | Admitting: Orthopaedic Surgery

## 2016-05-16 ENCOUNTER — Ambulatory Visit (INDEPENDENT_AMBULATORY_CARE_PROVIDER_SITE_OTHER): Payer: BLUE CROSS/BLUE SHIELD | Admitting: Orthopaedic Surgery

## 2016-05-16 VITALS — BP 165/83 | HR 94 | Ht 72.0 in | Wt 168.0 lb

## 2016-05-16 DIAGNOSIS — Z89511 Acquired absence of right leg below knee: Secondary | ICD-10-CM

## 2016-05-16 DIAGNOSIS — IMO0002 Reserved for concepts with insufficient information to code with codable children: Secondary | ICD-10-CM

## 2016-05-16 NOTE — Progress Notes (Signed)
Office Visit Note   Patient: Gordon Walker           Date of Birth: 25-Jun-1965           MRN: [id number] Visit Date: 05/16/2016              Requested by: Darrow Bussing, MD [address] [place], [place] [address] PCP: Darrow Bussing, MD   Assessment & Plan: Visit Diagnoses:  1. Below knee amputation status, right (HCC)     Plan: Patient's raver stump shrinker sock. Once his the eschar is completely healed he'll be ready for prosthetic fitting. Plan a recheck him in 3 months. 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 Once his prosthetic fitting is used to wearing it that he should be able to resume his other part-time job. He'll call let us know about that if he needs a work note. Office follow-up 3 months.  Follow-Up Instructions: Return in about 3 months (around 08/15/2016).   Orders:  No orders of the defined types were placed in this encounter.  No orders of the defined types were placed in this encounter.     Procedures: No procedures performed   Clinical Data: No additional findings.   Subjective: Chief Complaint  Patient presents with  . Right Knee - Routine Post Op    HPI patient returns post right BKA stump ReVision 03/21/2016 with VAC application. One small area of eschar. Swelling stand he is ready for a stump shrinker sock. Using some nitroglycerin patches. He is doing sitdown work at his regular job he is not quite ready to resume his part-time job. Dressing is dry he's been elevating it.  Review of Systems no change in 14 point review of systems no fever, chills.   Objective: Vital Signs: BP (!) 165/83   Pulse 94   Ht 6' (1.829 m)   Wt 168 lb (76.2 kg)   BMI 22.78 kg/m   Physical Exam  Constitutional: He is oriented to person, place, and time. He appears well-developed and well-nourished.  HENT:  Head: Normocephalic and atraumatic.  Eyes: EOM are normal. Pupils are equal, round, and reactive to light.  Neck:  No tracheal deviation present. No thyromegaly present.  Cardiovascular: Normal rate.   Pulmonary/Chest: Effort normal. He has no wheezes.  Abdominal: Soft. Bowel sounds are normal.  Musculoskeletal:  2 x 1 cm eschar lateral aspect of his BK closure on the right. No cellulitis. Knee range of motion is good edema stump is mild.  Neurological: He is alert and oriented to person, place, and time.  Skin: Skin is warm and dry. Capillary refill takes less than 2 seconds.  Psychiatric: He has a normal mood and affect. His behavior is normal. Judgment and thought content normal.    Ortho Exam  Specialty Comments:  No specialty comments available.  Imaging: No results found.   PMFS History: Patient Active Problem List   Diagnosis Date Noted  . Wound dehiscence, surgical   . Acute blood loss anemia 01/20/2016  . Status post below knee amputation of right lower extremity (HCC) 01/20/2016  . Hyponatremia 01/17/2016  . AKI (acute kidney injury) (HCC) 01/17/2016  . Hyperbilirubinemia 01/17/2016  . Bacteremia due to Escherichia coli 01/17/2016  . Sepsis (HCC) 01/17/2016  . DKA (diabetic ketoacidoses) (HCC) 01/15/2016  . Controlled diabetes mellitus with diabetic peripheral angiopathy and gangrene (HCC)   . 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, with long-term current use of  insulin (HCC)   . Transaminitis 01/07/2015  . Osteomyelitis of left foot (HCC) 01/06/2015  . Uncontrolled diabetes mellitus with foot ulcer, with long-term current use of insulin (HCC) 01/06/2015  . Anemia 01/06/2015  . Osteomyelitis (HCC) 01/05/2015   Past Medical History:  Diagnosis Date  . Complication of anesthesia    anesthesia complication 01/18/16; pt unaware what happened   . Dehiscence of amputation stump (HCC)    right  . Diabetes mellitus without complication (HCC)    Type 1  . Heart murmur    "years ago"  . Hypertension    "years ago"  . Type 1 diabetes mellitus  with diabetic peripheral angiopathy and gangrene, with long-term current use of insulin (HCC)     Family History  Problem Relation Age of Onset  . Hypertension Mother   . Diabetes Mother   . Cancer Mother   . Prostate cancer Father     Past Surgical History:  Procedure Laterality Date  . AMPUTATION Left 01/07/2015   Procedure: Left Foot 1st Ray Amputation;  Surgeon: Nadara Mustard, MD;  [facility];  Service: Orthopedics;  Laterality: Left;  . AMPUTATION Left 07/15/2015   Procedure: Left Transmetatarsal Amputation;  Surgeon: Nadara Mustard, MD;  Location: [facility] OR;  Service: Orthopedics;  Laterality: Left;  . AMPUTATION Right 12/30/2015   Procedure: 5th Ray Amputation Right Foot;  Surgeon: Nadara Mustard, MD;  Location: [facility] OR;  Service: Orthopedics;  Laterality: Right;  . AMPUTATION Right 01/15/2016   Procedure: GUILLOTINE AMPUTATION  ABOVE THE ANKLE AMPUTATION;  Surgeon: Eldred Manges, MD;  Location: [facility] OR;  Service: Orthopedics;  Laterality: Right;  . AMPUTATION Right 01/18/2016   Procedure: AMPUTATION BELOW KNEE;  Surgeon: Eldred Manges, MD;  Location: [facility] OR;  Service: Orthopedics;  Laterality: Right;  . STUMP REVISION Right 03/21/2016   Procedure: Right Below Knee Amputation Stump Revision, WOUND VAC application;  Surgeon: Eldred Manges, MD;  Location: [facility] OR;  Service: Orthopedics;  Laterality: Right;   Social History   Occupational History  . Not on file.   Social History Main Topics  . Smoking status: Never Smoker  . Smokeless tobacco: Never Used  . Alcohol use No  . Drug use: No  . Sexual activity: Not on file

## 2016-06-04 ENCOUNTER — Telehealth (INDEPENDENT_AMBULATORY_CARE_PROVIDER_SITE_OTHER): Payer: Self-pay | Admitting: Orthopaedic Surgery

## 2016-06-04 NOTE — Telephone Encounter (Signed)
Ok for note 

## 2016-06-04 NOTE — Telephone Encounter (Signed)
OK - thanks

## 2016-06-04 NOTE — Telephone Encounter (Signed)
Work note entered and placed at the front desk for pick up. I left voicemail for patient advising.

## 2016-06-04 NOTE — Telephone Encounter (Signed)
Patient called needing a note from Dr. Ophelia CharterYates stating that he will be able to go back to his part time job on June 16th. CB # [phone]

## 2016-06-13 ENCOUNTER — Telehealth (INDEPENDENT_AMBULATORY_CARE_PROVIDER_SITE_OTHER): Payer: Self-pay

## 2016-06-13 NOTE — Telephone Encounter (Signed)
Cody with [employer] [facility] would like to know if you received a fax for a CGM for patient.  CB# is [phone] ext. [phone]

## 2016-06-13 NOTE — Telephone Encounter (Signed)
I left voicemail for Cord[place]. I have paperwork and will give to Dr. Ophelia CharterYates in [facility] tomorrow to review.

## 2016-06-15 ENCOUNTER — Encounter (INDEPENDENT_AMBULATORY_CARE_PROVIDER_SITE_OTHER): Payer: Self-pay | Admitting: Orthopaedic Surgery

## 2016-06-15 ENCOUNTER — Ambulatory Visit (INDEPENDENT_AMBULATORY_CARE_PROVIDER_SITE_OTHER): Payer: BLUE CROSS/BLUE SHIELD | Admitting: Orthopaedic Surgery

## 2016-06-15 DIAGNOSIS — Z89511 Acquired absence of right leg below knee: Secondary | ICD-10-CM

## 2016-06-15 DIAGNOSIS — IMO0002 Reserved for concepts with insufficient information to code with codable children: Secondary | ICD-10-CM

## 2016-06-15 NOTE — Progress Notes (Signed)
Patient is 86 days status post right low the knee amputation and wound VAC placement. He comes in with a superficial blister. He denies any constitutional symptoms. On exam he has a serous fluid-filled superficial blister without any signs of infection. He has a small surgical wound dehiscence and superficial with good granulation tissue. No signs of infection again. I unroofed the blister and were start wet-to-dry dressings twice a day to the open wound. Daily Xeroform to the blister to allow to re-epithelialize. He is to follow-up with Dr. Ophelia CharterYates or his PA Zonia KiefJames Owens in about a week or 2 for wound check. He was told not to wear his prosthesis that he's been back on the walker or crutches.

## 2016-06-19 ENCOUNTER — Encounter: Payer: Self-pay | Admitting: Endocrinology

## 2016-06-19 ENCOUNTER — Ambulatory Visit (INDEPENDENT_AMBULATORY_CARE_PROVIDER_SITE_OTHER): Payer: BLUE CROSS/BLUE SHIELD | Admitting: Endocrinology

## 2016-06-19 ENCOUNTER — Telehealth (INDEPENDENT_AMBULATORY_CARE_PROVIDER_SITE_OTHER): Payer: Self-pay

## 2016-06-19 VITALS — BP 138/66 | HR 80 | Ht 72.0 in | Wt 173.0 lb

## 2016-06-19 DIAGNOSIS — E1052 Type 1 diabetes mellitus with diabetic peripheral angiopathy with gangrene: Secondary | ICD-10-CM

## 2016-06-19 LAB — POCT GLYCOSYLATED HEMOGLOBIN (HGB A1C): Hemoglobin A1C: 7.2

## 2016-06-19 MED ORDER — INSULIN GLARGINE 100 UNIT/ML ~~LOC~~ SOLN: 13.0000 [IU] | Freq: Every day | SUBCUTANEOUS | 11 refills | Status: DC

## 2016-06-19 MED ORDER — INSULIN ASPART 100 UNIT/ML FLEXPEN: PEN_INJECTOR | SUBCUTANEOUS | 11 refills | Status: DC

## 2016-06-19 NOTE — Progress Notes (Signed)
Subjective:    Patient ID: Gordon Walker, male    DOB: [birth year], 51 y.o.   MRN: [id number]  HPI Pt returns for f/u of diabetes mellitus: DM type: 1 Dx'ed: 1998 Complications: polyneuropathy, bilat LE amputations, nephropathy, and retinopathy.  Therapy: insulin since dx DKA: never Severe hypoglycemia: last episode was Sept, 2017.   Pancreatitis: never.   Other: he takes multiple daily injections; he declines pump rx.  Interval history:  He still has mild hypoglycemia approx twice per week.  This usually happens fasting.  no cbg record, but states cbg's vary from 60-200's.  It is still highest at HS.  Past Medical History:  Diagnosis Date  . Complication of anesthesia    anesthesia complication 01/18/16; pt unaware what happened   . Dehiscence of amputation stump (HCC)    right  . Diabetes mellitus without complication (HCC)    Type 1  . Heart murmur    "years ago"  . Hypertension    "years ago"  . 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, with long-term current use of insulin [facility](HCC)     Past Surgical History:  Procedure Laterality Date  . AMPUTATION Left 01/07/2015   Procedure: Left Foot 1st Ray Amputation;  Surgeon: Nadara MustardMarcus Duda V, MD;  [facility];  Service: Orthopedics;  Laterality: Left;  . AMPUTATION Left 07/15/2015   Procedure: Left Transmetatarsal Amputation;  Surgeon: Nadara MustardMarcus V Duda, MD;  Location: [facility] OR;  Service: Orthopedics;  Laterality: Left;  . AMPUTATION Right 12/30/2015   Procedure: 5th Ray Amputation Right Foot;  Surgeon: Nadara MustardMarcus V Duda, MD;  [facility];  Service: Orthopedics;  Laterality: Right;  . AMPUTATION Right 01/15/2016   Procedure: GUILLOTINE AMPUTATION  ABOVE THE ANKLE AMPUTATION;  Surgeon: Eldred MangesMark C Yates, MD;  Location: [facility] OR;  Service: Orthopedics;  Laterality: Right;  . AMPUTATION Right 01/18/2016   Procedure: AMPUTATION BELOW KNEE;  Surgeon: Eldred MangesMark C Yates, MD;  Location: [facility] OR;  Service: Orthopedics;   Laterality: Right;  . STUMP REVISION Right 03/21/2016   Procedure: Right Below Knee Amputation Stump Revision, WOUND VAC application;  Surgeon: Eldred MangesMark C Yates, MD;  Location: [facility] OR;  Service: Orthopedics;  Laterality: Right;    Social History   Social History  . Marital status: Married    Spouse name: N/A  . Number of children: N/A  . Years of education: N/A   Occupational History  . Not on file.   Social History Main Topics  . Smoking status: Never Smoker  . Smokeless tobacco: Never Used  . Alcohol use No  . Drug use: No  . Sexual activity: Not on file   Other Topics Concern  . Not on file   Social History Narrative  . No narrative on file    Current Outpatient Prescriptions on File Prior to Visit  Medication Sig Dispense Refill  . aspirin EC 81 MG tablet Take 81 mg by mouth daily.    Marland Kitchen. atorvastatin (LIPITOR) 20 MG tablet Take 1 tablet (20 mg total) by mouth daily. 30 tablet 0  . ferrous sulfate 325 (65 FE) MG tablet Take 1 tablet (325 mg total) by mouth 2 (two) times daily with a meal. 60 tablet 0  . hydroxypropyl methylcellulose / hypromellose (ISOPTO TEARS / GONIOVISC) 2.5 % ophthalmic solution Place 1 drop into both eyes 4 (four) times daily as needed for dry eyes.    Marland Kitchen. lisinopril (PRINIVIL,ZESTRIL) 10 MG tablet Take 1 tablet (10 mg total) by mouth daily.  30 tablet 0  . Multiple Vitamin (MULTIVITAMIN WITH MINERALS) TABS tablet Take 1 tablet by mouth daily.    . nitroGLYCERIN (NITRODUR - DOSED IN MG/24 HR) 0.2 mg/hr patch Place 0.2 mg onto the skin daily.    . silver sulfADIAZINE (SILVADENE) 1 % cream Apply 1 application topically daily.    . methocarbamol (ROBAXIN) 500 MG tablet Take 1 tablet (500 mg total) by mouth every 6 (six) hours as needed for muscle spasms. (Patient not taking: Reported on 06/19/2016) 60 tablet 0  . oxyCODONE-acetaminophen (PERCOCET) 7.5-325 MG tablet Take 1 tablet by mouth every 6 (six) hours as needed for severe pain. (Patient not taking: Reported  on 06/19/2016) 60 tablet 0   No current facility-administered medications on file prior to visit.     No Known Allergies  Family History  Problem Relation Age of Onset  . Hypertension Mother   . Diabetes Mother   . Cancer Mother   . Prostate cancer Father     BP 138/66   Pulse 80   Ht 6' (1.829 m)   Wt 173 lb (78.5 kg)   SpO2 94%   BMI 23.46 kg/m    Review of Systems Denies LOC    Objective:   Physical Exam VITAL SIGNS:  See vs page GENERAL: no distress.   Pulses: left dorsalis pedis intact.   MSK:  Left foot transmetatarsal amputation, well-healed. Right BKA.  CV: 1+ left leg edema Skin: healed ulcer at the lateral aspect of the left foot.  normal color and temp on the left foot. Neuro: sensation is intact to touch on the left foot, but severely decreased from normal.   Lab Results  Component Value Date   HGBA1C 7.2 06/19/2016      Assessment & Plan:  Type 1 DM, with DR: The pattern of his cbg's indicates he needs some adjustment in his therapy.   Patient Instructions  check your blood sugar 4 times a day: before the 3 meals, and at bedtime.  also check if you have symptoms of your blood sugar being too high or too low.  please keep a record of the readings and bring it to your next appointment here (or you can bring the meter itself).  You can write it on any piece of paper.  please call us sooner if your blood sugar goes below 70, or if you have a lot of readings over 200. For now, please take: Lantus, 13 units at bedtime, and:  novolog: 3 times a day (just before each meal), 7-7-9 units.  You can take 1 extra unit for a high blood sugar (300), or for a larger than usual meal.   Please come back for a follow-up appointment in 3-4 months.

## 2016-06-19 NOTE — Patient Instructions (Addendum)
check your blood sugar 4 times a day: before the 3 meals, and at bedtime.  also check if you have symptoms of your blood sugar being too high or too low.  please keep a record of the readings and bring it to your next appointment here (or you can bring the meter itself).  You can write it on any piece of paper.  please call us sooner if your blood sugar goes below 70, or if you have a lot of readings over 200. For now, please take: Lantus, 13 units at bedtime, and:  novolog: 3 times a day (just before each meal), 7-7-9 units.  You can take 1 extra unit for a high blood sugar (300), or for a larger than usual meal.   Please come back for a follow-up appointment in 3-4 months.

## 2016-06-19 NOTE — Telephone Encounter (Signed)
Cody with [employer] wanting to know if you have received paperwork on patient for Glucose Monitoring System.  CB# is [phone]  [phone].  Thank You

## 2016-06-19 NOTE — Telephone Encounter (Signed)
I attempted to reach Gearyody. I have faxed paperwork back to him to advise Dr. Ophelia CharterYates does not follow patient's for diabetes and that he would need to get this information from patient's PCP.

## 2016-06-22 ENCOUNTER — Telehealth (INDEPENDENT_AMBULATORY_CARE_PROVIDER_SITE_OTHER): Payer: Self-pay | Admitting: *Deleted

## 2016-06-22 ENCOUNTER — Encounter (INDEPENDENT_AMBULATORY_CARE_PROVIDER_SITE_OTHER): Payer: Self-pay | Admitting: Orthopaedic Surgery

## 2016-06-22 ENCOUNTER — Ambulatory Visit (INDEPENDENT_AMBULATORY_CARE_PROVIDER_SITE_OTHER): Payer: BLUE CROSS/BLUE SHIELD | Admitting: Orthopaedic Surgery

## 2016-06-22 VITALS — BP 158/85 | HR 76 | Ht 72.0 in | Wt 173.0 lb

## 2016-06-22 DIAGNOSIS — Z89511 Acquired absence of right leg below knee: Secondary | ICD-10-CM

## 2016-06-22 NOTE — Telephone Encounter (Signed)
Cody from [facility] [employer] called in regards to needing recent script notes and logs? If we could please fax those to [phone]. Thank you if you have any questions please CB # [phone].

## 2016-06-22 NOTE — Telephone Encounter (Signed)
I left voicemail for Gordon Walker again explaining that Dr. Ophelia CharterYates is an orthopedic surgeon and does not have access to or treat the patient for his diabetes. He will have to try PCP or endocrinologist.

## 2016-06-22 NOTE — Progress Notes (Signed)
Office Visit Note   Patient: Gordon Walker           Date of Birth: 03/31/1965           MRN: [id number] Visit Date: 06/22/2016              Requested by: Darrow Bussing, MD [address] [place], [place] [address] PCP: Darrow Bussing, MD   Assessment & Plan: Visit Diagnoses:  1. Status post below knee amputation of right lower extremity (HCC)     Plan: Patient will hold off on using his prosthesis to allow skin to heal. Follow-up in 4 weeks for recheck. Return sooner if needed.  Follow-Up Instructions: Return in about 4 weeks (around 07/20/2016).   Orders:  No orders of the defined types were placed in this encounter.  No orders of the defined types were placed in this encounter.     Procedures: No procedures performed   Clinical Data: No additional findings.   Subjective: Chief Complaint  Patient presents with  . Right Leg - Follow-up    HPI Patient returns for recheck of his skin. Status post BKA. A blister formed when he started to use his prosthesis. Prosthesis wrote stated that the sleeve possibly was not applied tight enough which may have caused the breakdown of the skin.  has not been using the prosthesis over last week.   Review of Systems  Constitutional: Negative.   HENT: Negative.   Respiratory: Negative.   Cardiovascular: Negative.   Gastrointestinal: Negative.   Musculoskeletal: Negative.   Psychiatric/Behavioral: Negative.      Objective: Vital Signs: BP (!) 158/85 (BP Location: Left Arm, Patient Position: Sitting)   Pulse 76   Ht 6' (1.829 m)   Wt 173 lb (78.5 kg)   BMI 23.46 kg/m   Physical Exam  Constitutional: He is oriented to person, place, and time. No distress.  HENT:  Head: Normocephalic and atraumatic.  Eyes: Pupils are equal, round, and reactive to light.  Neck: Normal range of motion.  Abdominal: He exhibits no distension.  Musculoskeletal:  There is some swelling of the stump. Slight serous  drainage from the blister that has been unroofed. No gross signs of infection. a scab has also been removed from the area along his previous surgical incision. This area is not completely healed yet and measures about a centimeter. No signs of infection there.  Neurological: He is alert and oriented to person, place, and time.  Skin: Skin is warm and dry.    Ortho Exam  Specialty Comments:  No specialty comments available.  Imaging: No results found.   PMFS History: Patient Active Problem List   Diagnosis Date Noted  . Wound dehiscence, surgical   . Acute blood loss anemia 01/20/2016  . Status post below knee amputation of right lower extremity (HCC) 01/20/2016  . Hyponatremia 01/17/2016  . AKI (acute kidney injury) (HCC) 01/17/2016  . Hyperbilirubinemia 01/17/2016  . Bacteremia due to Escherichia coli 01/17/2016  . Sepsis (HCC) 01/17/2016  . DKA (diabetic ketoacidoses) (HCC) 01/15/2016  . Controlled diabetes mellitus with diabetic peripheral angiopathy and gangrene (HCC)   . 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 Transaminitis 01/07/2015  . Osteomyelitis of left foot (HCC) 01/06/2015  . Uncontrolled diabetes mellitus with foot ulcer, with long-term current use of insulin (HCC) 01/06/2015  . Anemia 01/06/2015  . Osteomyelitis (HCC) 01/05/2015   Past Medical History:  Diagnosis Date  . Complication of anesthesia    anesthesia complication 01/18/16; pt unaware what happened   . Dehiscence of amputation stump (HCC)    right  . Diabetes mellitus without complication (HCC)    Type 1  . Heart murmur    "years ago"  . Hypertension    "years ago"  . 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, with long-term current use of insulin (HCC)     Family History  Problem Relation Age of Onset  . Hypertension Mother   . Diabetes Mother   . Cancer Mother   . Prostate  cancer Father     Past Surgical History:  Procedure Laterality Date  . AMPUTATION Left 01/07/2015   Procedure: Left Foot 1st Ray Amputation;  Surgeon: Nadara MustardMarcus Duda V, MD;  [facility];  Service: Orthopedics;  Laterality: Left;  . AMPUTATION Left 07/15/2015   Procedure: Left Transmetatarsal Amputation;  Surgeon: Nadara MustardMarcus V Duda, MD;  Location: [facility] OR;  Service: Orthopedics;  Laterality: Left;  . AMPUTATION Right 12/30/2015   Procedure: 5th Ray Amputation Right Foot;  Surgeon: Nadara MustardMarcus V Duda, MD;  [facility];  Service: Orthopedics;  Laterality: Right;  . AMPUTATION Right 01/15/2016   Procedure: GUILLOTINE AMPUTATION  ABOVE THE ANKLE AMPUTATION;  Surgeon: Eldred MangesMark C Yates, MD;  Location: [facility] OR;  Service: Orthopedics;  Laterality: Right;  . AMPUTATION Right 01/18/2016   Procedure: AMPUTATION BELOW KNEE;  Surgeon: Eldred MangesMark C Yates, MD;  Location: [facility] OR;  Service: Orthopedics;  Laterality: Right;  . STUMP REVISION Right 03/21/2016   Procedure: Right Below Knee Amputation Stump Revision, WOUND VAC application;  Surgeon: Eldred MangesMark C Yates, MD;  Location: [facility] OR;  Service: Orthopedics;  Laterality: Right;   Social History   Occupational History  . Not on file.   Social History Main Topics  . Smoking status: Never Smoker  . Smokeless tobacco: Never Used  . Alcohol use No  . Drug use: No  . Sexual activity: Not on file

## 2016-06-25 ENCOUNTER — Other Ambulatory Visit (INDEPENDENT_AMBULATORY_CARE_PROVIDER_SITE_OTHER): Payer: Self-pay | Admitting: Radiology

## 2016-06-25 MED ORDER — NITROGLYCERIN 0.2 MG/HR TD PT24: 0.2000 mg | MEDICATED_PATCH | Freq: Every day | TRANSDERMAL | 0 refills | Status: DC

## 2016-06-25 NOTE — Telephone Encounter (Signed)
Ok for refill? 

## 2016-07-06 ENCOUNTER — Ambulatory Visit (INDEPENDENT_AMBULATORY_CARE_PROVIDER_SITE_OTHER): Payer: BLUE CROSS/BLUE SHIELD | Admitting: Orthopaedic Surgery

## 2016-07-06 ENCOUNTER — Encounter (INDEPENDENT_AMBULATORY_CARE_PROVIDER_SITE_OTHER): Payer: Self-pay | Admitting: Orthopaedic Surgery

## 2016-07-06 VITALS — BP 164/95 | HR 85 | Ht 72.0 in | Wt 173.0 lb

## 2016-07-06 DIAGNOSIS — T8789 Other complications of amputation stump: Secondary | ICD-10-CM

## 2016-07-06 DIAGNOSIS — L89891 Pressure ulcer of other site, stage 1: Secondary | ICD-10-CM | POA: Diagnosis not present

## 2016-07-06 NOTE — Progress Notes (Signed)
Office Visit Note   Patient: Gordon Walker           Date of Birth: [birth year]           MRN: [id number] Visit Date: 07/06/2016              Requested by: Darrow Bussing, MD [address] [place], [place] [address] PCP: Darrow Bussing, MD   Assessment & Plan: Visit Diagnoses:  1. Pressure ulcer of BKA stump, stage 1 (HCC)     Plan: Patient can occasionally apply some Bactroban to keep it clean. Adaptic to help was sticking and continue to keep it clean watches CBGs carefully and follow his diabetes carefully. He likely will have to ramp up on his prosthetic usage much slower than normal due to the sensitive nature of his stump from microvascular problems with poor diabetic control over long number of years. He has no active cellulitis. I'll check him back again on appearing basis and as this heals he can follow-up with the prosthetist.  Follow-Up Instructions: No Follow-up on file.   Orders:  No orders of the defined types were placed in this encounter.  No orders of the defined types were placed in this encounter.     Procedures: No procedures performed   Clinical Data: No additional findings.   Subjective: Chief Complaint  Patient presents with  . Right Knee - Wound Check    HPI Patient had the pressure ulcer BKA stump after only using his prosthesis for 2 days. Tibial stump is shiny and the ulceration is just through the epidermis. He has some the red granulation tissue present on the air that's about size of a half-dollar. He's had a secondary that's the development smaller and is elliptical shape and lateral to the midline of his BK incision. He has no cellulitis. He's been applying nitroglycerin patch. No fever or chills. He states his diabetes is doing better he's been paying much more attention to his diabetes and has an improved A1c at 7.2.    Review of Systems 14 point review of systems updated and is unchanged from last office visit other  than as mentioned above in history of present illness.   Objective: Vital Signs: BP (!) 164/95   Pulse 85   Ht 6' (1.829 m)   Wt 173 lb (78.5 kg)   BMI 23.46 kg/m   Physical Exam  Constitutional: He is oriented to person, place, and time. He appears well-developed and well-nourished.  HENT:  Head: Normocephalic and atraumatic.  Eyes: EOM are normal. Pupils are equal, round, and reactive to light.  Neck: No tracheal deviation present. No thyromegaly present.  Cardiovascular: Normal rate.   Pulmonary/Chest: Effort normal. He has no wheezes.  Abdominal: Soft. Bowel sounds are normal.  Musculoskeletal:  No lesions in his opposite right foot. Left BKA stump shows silver dollar size area with some granulation. He's been applying some Adaptic after soap water and lotion. Tip of skin is thin and shiny. No bony prominences with palpation of the stump no pitting edema.  Neurological: He is alert and oriented to person, place, and time.  Skin: Skin is warm and dry. Capillary refill takes less than 2 seconds.  Psychiatric: He has a normal mood and affect. His behavior is normal. Judgment and thought content normal.    Ortho Exam  Specialty Comments:  No specialty comments available.  Imaging: No results found.   PMFS History: Patient Active Problem List   Diagnosis Date Noted  .  Wound dehiscence, surgical   . Acute blood loss anemia 01/20/2016  . Status post below knee amputation of right lower extremity (HCC) 01/20/2016  . Hyponatremia 01/17/2016  . AKI (acute kidney injury) (HCC) 01/17/2016  . Hyperbilirubinemia 01/17/2016  . Bacteremia due to Escherichia coli 01/17/2016  . Sepsis (HCC) 01/17/2016  . DKA (diabetic ketoacidoses) (HCC) 01/15/2016  . Controlled diabetes mellitus with diabetic peripheral angiopathy and gangrene (HCC)   . 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 Transaminitis  01/07/2015  . Osteomyelitis of left foot (HCC) 01/06/2015  . Uncontrolled diabetes mellitus with foot ulcer, with long-term current use of insulin (HCC) 01/06/2015  . Anemia 01/06/2015  . Osteomyelitis (HCC) 01/05/2015   Past Medical History:  Diagnosis Date  . Complication of anesthesia    anesthesia complication 01/18/16; pt unaware what happened   . Dehiscence of amputation stump (HCC)    right  . Diabetes mellitus without complication (HCC)    Type 1  . Heart murmur    "years ago"  . Hypertension    "years ago"  . 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, with long-term current use of insulin (HCC)     Family History  Problem Relation Age of Onset  . Hypertension Mother   . Diabetes Mother   . Cancer Mother   . Prostate cancer Father     Past Surgical History:  Procedure Laterality Date  . AMPUTATION Left 01/07/2015   Procedure: Left Foot 1st Ray Amputation;  Surgeon: Nadara MustardMarcus Duda V, MD;  [facility];  Service: Orthopedics;  Laterality: Left;  . AMPUTATION Left 07/15/2015   Procedure: Left Transmetatarsal Amputation;  Surgeon: Nadara MustardMarcus V Duda, MD;  Location[facility];  Service: Orthopedics;  Laterality: Left;  . AMPUTATION Right 12/30/2015   Procedure: 5th Ray Amputation Right Foot;  Surgeon: Nadara MustardMarcus V Duda, MD;  [facility];  Service: Orthopedics;  Laterality: Right;  . AMPUTATION Right 01/15/2016   Procedure: GUILLOTINE AMPUTATION  ABOVE THE ANKLE AMPUTATION;  Surgeon: Eldred MangesMark C Murphy Bundick, MD;  Location: [facility] OR;  Service: Orthopedics;  Laterality: Right;  . AMPUTATION Right 01/18/2016   Procedure: AMPUTATION BELOW KNEE;  Surgeon: Eldred MangesMark C Chee Kinslow, MD;  Location: [facility];  Service: Orthopedics;  Laterality: Right;  . STUMP REVISION Right 03/21/2016   Procedure: Right Below Knee Amputation Stump Revision, WOUND VAC application;  Surgeon: Eldred MangesMark C Kamdyn Covel, MD;  Location: [facility] OR;  Service: Orthopedics;  Laterality: Right;   Social History   Occupational  History  . Not on file.   Social History Main Topics  . Smoking status: Never Smoker  . Smokeless tobacco: Never Used  . Alcohol use No  . Drug use: No  . Sexual activity: Not on file

## 2016-07-20 ENCOUNTER — Ambulatory Visit (INDEPENDENT_AMBULATORY_CARE_PROVIDER_SITE_OTHER): Payer: BLUE CROSS/BLUE SHIELD | Admitting: Orthopaedic Surgery

## 2016-07-24 ENCOUNTER — Ambulatory Visit (INDEPENDENT_AMBULATORY_CARE_PROVIDER_SITE_OTHER): Payer: BLUE CROSS/BLUE SHIELD | Admitting: Orthopaedic Surgery

## 2016-07-31 ENCOUNTER — Ambulatory Visit (INDEPENDENT_AMBULATORY_CARE_PROVIDER_SITE_OTHER): Payer: BLUE CROSS/BLUE SHIELD | Admitting: Orthopaedic Surgery

## 2016-08-15 ENCOUNTER — Ambulatory Visit (INDEPENDENT_AMBULATORY_CARE_PROVIDER_SITE_OTHER): Payer: BLUE CROSS/BLUE SHIELD | Admitting: Orthopaedic Surgery

## 2016-08-15 ENCOUNTER — Encounter (INDEPENDENT_AMBULATORY_CARE_PROVIDER_SITE_OTHER): Payer: Self-pay | Admitting: Orthopaedic Surgery

## 2016-08-15 VITALS — BP 145/92 | HR 76 | Ht 72.0 in | Wt 173.0 lb

## 2016-08-15 DIAGNOSIS — Z89511 Acquired absence of right leg below knee: Secondary | ICD-10-CM | POA: Diagnosis not present

## 2016-08-15 NOTE — Progress Notes (Signed)
Office Visit Note   Patient: Gordon CousinFrederick Chiusano           Date of Birth: 18-Oct-1965           MRN: [id number] Visit Date: 08/15/2016              Requested by: Darrow BussingKoirala, Dibas, MD [address] [place], [place] [id number] PCP: Darrow BussingKoirala, Dibas, MD   Assessment & Plan: Visit Diagnoses:  1. Status post below knee amputation of right lower extremity (HCC)     Plan: The left to discontinue to give this more time continuous stump shrinkage and lateral skin gradually heal. Is keeping it clean washing it daily with soap and water. He has no cellulitis. He understands his microcirculation for years of diabetes has not been good and with time this should continue to improve.  Follow-Up Instructions: No Follow-up on file.   Orders:  No orders of the defined types were placed in this encounter.  No orders of the defined types were placed in this encounter.     Procedures: No procedures performed   Clinical Data: No additional findings.   Subjective: Chief Complaint  Patient presents with  . Right Knee - Follow-up    HPI patient returns post diabetic infection of his foot and had the right BKA. He's been going on for last several months trying to get fitted with a prosthesis and each time after short periods of time 15-30 minutes Ms. developed blisters over the end of his stump. Has some mild edema residual in the stump. Skin over the end of the stump is been shiny. He has one airway had previous blister which is in the process of healing. Review of Systems review of systems is reviewed and updated. He has type 1 diabetes with the previous foot gangrene long-term insulin usage and has had significant improvement in his diabetic control since his  BKA.    Objective: Vital Signs: BP (!) 145/92   Pulse 76   Ht 6' (1.829 m)   Wt 173 lb (78.5 kg)   BMI 23.46 kg/m   Physical Exam  Constitutional: He is oriented to person, place, and time. He appears well-developed  and well-nourished.  HENT:  Head: Normocephalic and atraumatic.  Eyes: Pupils are equal, round, and reactive to light. EOM are normal.  Neck: No tracheal deviation present. No thyromegaly present.  Cardiovascular: Normal rate.   Pulmonary/Chest: Effort normal. He has no wheezes.  Abdominal: Soft. Bowel sounds are normal.  Neurological: He is alert and oriented to person, place, and time.  Skin: Skin is warm and dry. Capillary refill takes less than 2 seconds.  Psychiatric: He has a normal mood and affect. His behavior is normal. Judgment and thought content normal.    Ortho Exam the tip stump shows a 1 cm round healing area where the blister just proximal to the incision directly over the tibia. Previously he said some blister formation over the individual's BKA and this is now anteriorly over the tibia. There is remote medial lateral show less shininess and skin is of more normal texture. He has good capillary refill.   Specialty Comments:  No specialty comments available.  Imaging: No results found.   PMFS History: Patient Active Problem List   Diagnosis Date Noted  . Wound dehiscence, surgical   . Acute blood loss anemia 01/20/2016  . Status post below knee amputation of right lower extremity (HCC) 01/20/2016  . Hyponatremia 01/17/2016  . AKI (acute kidney injury) (  HCC) 01/17/2016  . Hyperbilirubinemia 01/17/2016  . Bacteremia due to Escherichia coli 01/17/2016  . Sepsis (HCC) 01/17/2016  . DKA (diabetic ketoacidoses) (HCC) 01/15/2016  . Controlled diabetes mellitus with diabetic peripheral angiopathy and gangrene (HCC)   . 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 Transaminitis 01/07/2015  . Osteomyelitis of left foot (HCC) 01/06/2015  . Uncontrolled diabetes mellitus with foot ulcer, with long-term current use of insulin (HCC) 01/06/2015  . Anemia 01/06/2015  . Osteomyelitis (HCC) 01/05/2015     Past Medical History:  Diagnosis Date  . Complication of anesthesia    anesthesia complication 01/18/16; pt unaware what happened   . Dehiscence of amputation stump (HCC)    right  . Diabetes mellitus without complication (HCC)    Type 1  . Heart murmur    "years ago"  . Hypertension    "years ago"  . 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, with long-term current use of insulin (HCC)     Family History  Problem Relation Age of Onset  . Hypertension Mother   . Diabetes Mother   . Cancer Mother   . Prostate cancer Father     Past Surgical History:  Procedure Laterality Date  . AMPUTATION Left 01/07/2015   Procedure: Left Foot 1st Ray Amputation;  Surgeon: Nadara MustardMarcus Duda V, MD;  [facility];  Service: Orthopedics;  Laterality: Left;  . AMPUTATION Left 07/15/2015   Procedure: Left Transmetatarsal Amputation;  Surgeon: Nadara MustardMarcus V Duda, MD;  [facility];  Service: Orthopedics;  Laterality: Left;  . AMPUTATION Right 12/30/2015   Procedure: 5th Ray Amputation Right Foot;  Surgeon: Nadara MustardMarcus V Duda, MD;  [facility];  Service: Orthopedics;  Laterality: Right;  . AMPUTATION Right 01/15/2016   Procedure: GUILLOTINE AMPUTATION  ABOVE THE ANKLE AMPUTATION;  Surgeon: Eldred MangesMark C Isayah Ignasiak, MD;  [facility];  Service: Orthopedics;  Laterality: Right;  . AMPUTATION Right 01/18/2016   Procedure: AMPUTATION BELOW KNEE;  Surgeon: Eldred MangesMark C Kandyce Dieguez, MD;  [facility];  Service: Orthopedics;  Laterality: Right;  . STUMP REVISION Right 03/21/2016   Procedure: Right Below Knee Amputation Stump Revision, WOUND VAC application;  Surgeon: Eldred MangesMark C Chania Kochanski, MD;  [facility];  Service: Orthopedics;  Laterality: Right;   Social History   Occupational History  . Not on file.   Social History Main Topics  . Smoking status: Never Smoker  . Smokeless tobacco: Never Used  . Alcohol use No  . Drug use: No  . Sexual activity: Not on file

## 2016-08-28 ENCOUNTER — Other Ambulatory Visit (INDEPENDENT_AMBULATORY_CARE_PROVIDER_SITE_OTHER): Payer: Self-pay | Admitting: Orthopaedic Surgery

## 2016-08-28 NOTE — Telephone Encounter (Signed)
Ok to refill 

## 2016-09-06 ENCOUNTER — Encounter: Payer: Self-pay | Admitting: Endocrinology

## 2016-09-21 ENCOUNTER — Other Ambulatory Visit (INDEPENDENT_AMBULATORY_CARE_PROVIDER_SITE_OTHER): Payer: Self-pay | Admitting: Orthopaedic Surgery

## 2016-09-21 NOTE — Telephone Encounter (Signed)
Can you please advise?

## 2016-09-28 ENCOUNTER — Other Ambulatory Visit: Payer: Self-pay | Admitting: Family Medicine

## 2016-09-28 DIAGNOSIS — R7401 Elevation of levels of liver transaminase levels: Secondary | ICD-10-CM

## 2016-09-28 DIAGNOSIS — R740 Nonspecific elevation of levels of transaminase and lactic acid dehydrogenase [LDH]: Principal | ICD-10-CM

## 2016-10-03 ENCOUNTER — Ambulatory Visit: Payer: BLUE CROSS/BLUE SHIELD | Admitting: Endocrinology

## 2016-10-03 ENCOUNTER — Observation Stay (HOSPITAL_COMMUNITY): Payer: BLUE CROSS/BLUE SHIELD

## 2016-10-03 ENCOUNTER — Encounter (HOSPITAL_COMMUNITY): Payer: Self-pay | Admitting: Physician Assistant

## 2016-10-03 ENCOUNTER — Emergency Department (HOSPITAL_COMMUNITY): Payer: BLUE CROSS/BLUE SHIELD

## 2016-10-03 ENCOUNTER — Observation Stay (HOSPITAL_COMMUNITY)
Admission: EM | Admit: 2016-10-03 | Discharge: 2016-10-05 | Disposition: A | Discharge status: Home / Self Care | Payer: BLUE CROSS/BLUE SHIELD | Attending: Internal Medicine | Admitting: Internal Medicine

## 2016-10-03 DIAGNOSIS — Z7982 Long term (current) use of aspirin: Secondary | ICD-10-CM | POA: Diagnosis not present

## 2016-10-03 DIAGNOSIS — I96 Gangrene, not elsewhere classified: Secondary | ICD-10-CM | POA: Insufficient documentation

## 2016-10-03 DIAGNOSIS — Z89432 Acquired absence of left foot: Secondary | ICD-10-CM | POA: Diagnosis not present

## 2016-10-03 DIAGNOSIS — K3184 Gastroparesis: Secondary | ICD-10-CM | POA: Diagnosis not present

## 2016-10-03 DIAGNOSIS — I10 Essential (primary) hypertension: Secondary | ICD-10-CM | POA: Insufficient documentation

## 2016-10-03 DIAGNOSIS — Z794 Long term (current) use of insulin: Secondary | ICD-10-CM | POA: Insufficient documentation

## 2016-10-03 DIAGNOSIS — Z79899 Other long term (current) drug therapy: Secondary | ICD-10-CM | POA: Diagnosis not present

## 2016-10-03 DIAGNOSIS — Z7901 Long term (current) use of anticoagulants: Secondary | ICD-10-CM | POA: Diagnosis not present

## 2016-10-03 DIAGNOSIS — Z0289 Encounter for other administrative examinations: Secondary | ICD-10-CM

## 2016-10-03 DIAGNOSIS — Z89511 Acquired absence of right leg below knee: Secondary | ICD-10-CM | POA: Insufficient documentation

## 2016-10-03 DIAGNOSIS — I152 Hypertension secondary to endocrine disorders: Secondary | ICD-10-CM

## 2016-10-03 DIAGNOSIS — Z01818 Encounter for other preprocedural examination: Secondary | ICD-10-CM

## 2016-10-03 DIAGNOSIS — R7989 Other specified abnormal findings of blood chemistry: Secondary | ICD-10-CM | POA: Insufficient documentation

## 2016-10-03 DIAGNOSIS — E1052 Type 1 diabetes mellitus with diabetic peripheral angiopathy with gangrene: Secondary | ICD-10-CM | POA: Diagnosis not present

## 2016-10-03 DIAGNOSIS — E785 Hyperlipidemia, unspecified: Secondary | ICD-10-CM | POA: Insufficient documentation

## 2016-10-03 DIAGNOSIS — R112 Nausea with vomiting, unspecified: Secondary | ICD-10-CM | POA: Diagnosis not present

## 2016-10-03 DIAGNOSIS — R7401 Elevation of levels of liver transaminase levels: Secondary | ICD-10-CM | POA: Diagnosis present

## 2016-10-03 DIAGNOSIS — R1011 Right upper quadrant pain: Secondary | ICD-10-CM | POA: Diagnosis present

## 2016-10-03 DIAGNOSIS — R945 Abnormal results of liver function studies: Secondary | ICD-10-CM

## 2016-10-03 DIAGNOSIS — R740 Nonspecific elevation of levels of transaminase and lactic acid dehydrogenase [LDH]: Secondary | ICD-10-CM | POA: Diagnosis not present

## 2016-10-03 DIAGNOSIS — E1043 Type 1 diabetes mellitus with diabetic autonomic (poly)neuropathy: Principal | ICD-10-CM | POA: Insufficient documentation

## 2016-10-03 DIAGNOSIS — K828 Other specified diseases of gallbladder: Secondary | ICD-10-CM | POA: Diagnosis not present

## 2016-10-03 LAB — LIPID PANEL
CHOLESTEROL: 118 mg/dL (ref 0–200)
HDL: 75 mg/dL (ref >40)
LDL Cholesterol: 33 mg/dL (ref 0–99)
TRIGLYCERIDES: 48 mg/dL (ref <150)
Total CHOL/HDL Ratio: 1.6 RATIO
VLDL: 10 mg/dL (ref 0–40)

## 2016-10-03 LAB — COMPREHENSIVE METABOLIC PANEL
ALT: 69 U/L — ABNORMAL HIGH (ref 17–63)
AST: 42 U/L — ABNORMAL HIGH (ref 15–41)
Albumin: 4 g/dL (ref 3.5–5.0)
Alkaline Phosphatase: 99 U/L (ref 38–126)
Anion gap: 10 (ref 5–15)
BILIRUBIN TOTAL: 1.7 mg/dL — AB (ref 0.3–1.2)
BUN: 21 mg/dL — ABNORMAL HIGH (ref 6–20)
CHLORIDE: 100 mmol/L — AB (ref 101–111)
CO2: 24 mmol/L (ref 22–32)
Calcium: 9.4 mg/dL (ref 8.9–10.3)
Creatinine, Ser: 1.19 mg/dL (ref 0.61–1.24)
Glucose, Bld: 281 mg/dL — ABNORMAL HIGH (ref 65–99)
POTASSIUM: 3.6 mmol/L (ref 3.5–5.1)
Sodium: 134 mmol/L — ABNORMAL LOW (ref 135–145)
Total Protein: 7.5 g/dL (ref 6.5–8.1)

## 2016-10-03 LAB — URINALYSIS, ROUTINE W REFLEX MICROSCOPIC
BILIRUBIN URINE: NEGATIVE
Glucose, UA: >=500 mg/dL — AB
HGB URINE DIPSTICK: NEGATIVE
KETONES UR: 80 mg/dL — AB
LEUKOCYTES UA: NEGATIVE
NITRITE: NEGATIVE
PH: 5 (ref 5.0–8.0)
Protein, ur: 100 mg/dL — AB
SPECIFIC GRAVITY, URINE: 1.029 (ref 1.005–1.030)

## 2016-10-03 LAB — HEPATIC FUNCTION PANEL
ALK PHOS: 99 U/L (ref 38–126)
ALT: 69 U/L — ABNORMAL HIGH (ref 17–63)
AST: 47 U/L — ABNORMAL HIGH (ref 15–41)
Albumin: 3.9 g/dL (ref 3.5–5.0)
BILIRUBIN DIRECT: 0.2 mg/dL (ref 0.1–0.5)
BILIRUBIN TOTAL: 1.8 mg/dL — AB (ref 0.3–1.2)
Indirect Bilirubin: 1.6 mg/dL — ABNORMAL HIGH (ref 0.3–0.9)
Total Protein: 7.4 g/dL (ref 6.5–8.1)

## 2016-10-03 LAB — GLUCOSE, CAPILLARY: Glucose-Capillary: 375 mg/dL — ABNORMAL HIGH (ref 65–99)

## 2016-10-03 LAB — CBC
HEMATOCRIT: 38.9 % — AB (ref 39.0–52.0)
Hemoglobin: 13.7 g/dL (ref 13.0–17.0)
MCH: 29.1 pg (ref 26.0–34.0)
MCHC: 35.2 g/dL (ref 30.0–36.0)
MCV: 82.6 fL (ref 78.0–100.0)
PLATELETS: 271 10*3/uL (ref 150–400)
RBC: 4.71 MIL/uL (ref 4.22–5.81)
RDW: 12.3 % (ref 11.5–15.5)
WBC: 10 10*3/uL (ref 4.0–10.5)

## 2016-10-03 LAB — HEMOGLOBIN A1C
HEMOGLOBIN A1C: 7 % — AB (ref 4.8–5.6)
Mean Plasma Glucose: 154.2 mg/dL

## 2016-10-03 LAB — CBG MONITORING, ED
Glucose-Capillary: 291 mg/dL — ABNORMAL HIGH (ref 65–99)
Glucose-Capillary: 395 mg/dL — ABNORMAL HIGH (ref 65–99)

## 2016-10-03 LAB — LIPASE, BLOOD: LIPASE: 26 U/L (ref 11–51)

## 2016-10-03 MED ORDER — SODIUM CHLORIDE 0.9 % IV BOLUS (SEPSIS)
1000.0000 mL | Freq: Once | INTRAVENOUS | Status: AC
  Administered 2016-10-03: 1000 mL via INTRAVENOUS

## 2016-10-03 MED ORDER — LISINOPRIL 10 MG PO TABS
10.0000 mg | ORAL_TABLET | Freq: Every day | ORAL | Status: DC
  Administered 2016-10-03 – 2016-10-05 (×3): 10 mg via ORAL
  Filled 2016-10-03 (×3): qty 1

## 2016-10-03 MED ORDER — ONDANSETRON 4 MG PO TBDP
ORAL_TABLET | ORAL | Status: AC
  Filled 2016-10-03: qty 1

## 2016-10-03 MED ORDER — ACETAMINOPHEN 650 MG RE SUPP: 650.0000 mg | Freq: Four times a day (QID) | RECTAL | Status: DC | PRN

## 2016-10-03 MED ORDER — SENNOSIDES-DOCUSATE SODIUM 8.6-50 MG PO TABS
1.0000 | ORAL_TABLET | Freq: Every evening | ORAL | Status: DC | PRN
  Filled 2016-10-03: qty 1

## 2016-10-03 MED ORDER — INSULIN GLARGINE 100 UNIT/ML ~~LOC~~ SOLN
10.0000 [IU] | Freq: Every day | SUBCUTANEOUS | Status: DC
  Administered 2016-10-03: 10 [IU] via SUBCUTANEOUS
  Filled 2016-10-03 (×2): qty 0.1

## 2016-10-03 MED ORDER — PANTOPRAZOLE SODIUM 40 MG IV SOLR
40.0000 mg | Freq: Once | INTRAVENOUS | Status: AC
  Administered 2016-10-03: 40 mg via INTRAVENOUS
  Filled 2016-10-03: qty 40

## 2016-10-03 MED ORDER — BISACODYL 10 MG RE SUPP: 10.0000 mg | Freq: Every day | RECTAL | Status: DC | PRN

## 2016-10-03 MED ORDER — ONDANSETRON HCL 4 MG/2ML IJ SOLN
4.0000 mg | Freq: Four times a day (QID) | INTRAMUSCULAR | Status: DC | PRN
Start: 2016-10-03 — End: 2016-10-05
  Administered 2016-10-04: 4 mg via INTRAVENOUS
  Filled 2016-10-03: qty 2

## 2016-10-03 MED ORDER — ATORVASTATIN CALCIUM 10 MG PO TABS
20.0000 mg | ORAL_TABLET | Freq: Every day | ORAL | Status: DC
  Administered 2016-10-04 – 2016-10-05 (×2): 20 mg via ORAL
  Filled 2016-10-03: qty 1
  Filled 2016-10-03 (×2): qty 2

## 2016-10-03 MED ORDER — INSULIN ASPART 100 UNIT/ML ~~LOC~~ SOLN
0.0000 [IU] | Freq: Three times a day (TID) | SUBCUTANEOUS | Status: DC
  Administered 2016-10-03: 9 [IU] via SUBCUTANEOUS
  Administered 2016-10-04: 5 [IU] via SUBCUTANEOUS
  Filled 2016-10-03: qty 1

## 2016-10-03 MED ORDER — ONDANSETRON HCL 4 MG/2ML IJ SOLN
4.0000 mg | Freq: Once | INTRAMUSCULAR | Status: AC
  Administered 2016-10-03: 4 mg via INTRAVENOUS
  Filled 2016-10-03: qty 2

## 2016-10-03 MED ORDER — TECHNETIUM TC 99M TETROFOSMIN IV KIT
5.2000 | PACK | Freq: Once | INTRAVENOUS | Status: AC
  Administered 2016-10-03: 5.2 via INTRAVENOUS

## 2016-10-03 MED ORDER — ONDANSETRON HCL 4 MG PO TABS
4.0000 mg | ORAL_TABLET | Freq: Four times a day (QID) | ORAL | Status: DC | PRN
  Filled 2016-10-03: qty 1

## 2016-10-03 MED ORDER — PROMETHAZINE HCL 25 MG/ML IJ SOLN
12.5000 mg | Freq: Once | INTRAMUSCULAR | Status: AC
  Administered 2016-10-03: 12.5 mg via INTRAVENOUS
  Filled 2016-10-03: qty 1

## 2016-10-03 MED ORDER — SODIUM CHLORIDE 0.9 % IV SOLN
INTRAVENOUS | Status: DC
  Administered 2016-10-03 – 2016-10-05 (×3): via INTRAVENOUS

## 2016-10-03 MED ORDER — ASPIRIN EC 81 MG PO TBEC
81.0000 mg | DELAYED_RELEASE_TABLET | Freq: Every day | ORAL | Status: DC
  Administered 2016-10-04 – 2016-10-05 (×2): 81 mg via ORAL
  Filled 2016-10-03 (×2): qty 1

## 2016-10-03 MED ORDER — HYDRALAZINE HCL 20 MG/ML IJ SOLN
5.0000 mg | Freq: Three times a day (TID) | INTRAMUSCULAR | Status: DC | PRN
  Administered 2016-10-03 – 2016-10-04 (×2): 10 mg via INTRAVENOUS
  Filled 2016-10-03 (×2): qty 1

## 2016-10-03 MED ORDER — HYDROCODONE-ACETAMINOPHEN 5-325 MG PO TABS
1.0000 | ORAL_TABLET | ORAL | Status: DC | PRN
  Administered 2016-10-03: 1 via ORAL
  Filled 2016-10-03: qty 1

## 2016-10-03 MED ORDER — ONDANSETRON 4 MG PO TBDP
4.0000 mg | ORAL_TABLET | Freq: Once | ORAL | Status: AC | PRN
  Administered 2016-10-03: 4 mg via ORAL

## 2016-10-03 MED ORDER — ACETAMINOPHEN 325 MG PO TABS: 650.0000 mg | ORAL_TABLET | Freq: Four times a day (QID) | ORAL | Status: DC | PRN

## 2016-10-03 MED ORDER — NITROGLYCERIN 0.2 MG/HR TD PT24
0.2000 mg | MEDICATED_PATCH | Freq: Every day | TRANSDERMAL | Status: DC
  Administered 2016-10-03 – 2016-10-05 (×3): 0.2 mg via TRANSDERMAL
  Filled 2016-10-03 (×4): qty 1

## 2016-10-03 NOTE — ED Notes (Signed)
Patient remains in nuclear medicine .

## 2016-10-03 NOTE — H&P (Signed)
History and Physical    Gordon Walker EXB:[id number] DOB: [birth year] DOA: 10/03/2016   PCP: Lujean Amel, MD   Patient coming from:  Home    Chief Complaint: Abdominal pain and emesis   HPI: Gordon Walker is a 51 y.o. male with medical history significant for HTN, HLD,DM1,s/p RLE BKA due to poor vascular issues,  presenting with 2 day history of right upper quadrant pain, accompanied by nausea and vomiting. Initially, he had seen some blood tinge in his emesis, but this has resolved prior to admission. Initially, the patient had made arrangements to have an ultrasound of the abdomen as OP,  but the symptoms were severe, thus presenting to the ED . Hereceived IV Zofran, IV antiemetics, and pain control, with improvement of his symptoms. Denies fevers, chills, night sweats, vision changes, or mucositis. Denies any respiratory complaints. Denies any chest pain or palpitations. Denies left lower extremity swelling. Denies nausea, heartburn or change in bowel habits  Appetite is normal. Denies any dysuria. Denies abnormal skin rashes, or neuropathy. Denies any bleeding issues such as epistaxis, hematemesis, hematuria or hematochezia.    ED Course:  BP (!) 198/87   Pulse (!) 104   Temp 98.4 F (36.9 C) (Oral)   Resp 16   Ht 6' (1.829 m)   Wt 79.4 kg (175 lb)   SpO2 94%   BMI 23.73 kg/m    Received IV Zofran, IV Protonix, IV Phenergan, and 2 Lbolus with normal saline Normal WBC, total bilirubin 1.7, rest of the labs are unremarkable Lipase normal  Abd US shows sludge but no discrete lithiasis, no apparent cholecystitis,  Review of Systems:  As per HPI otherwise all other systems reviewed and are negative  Past Medical History:  Diagnosis Date  . Complication of anesthesia    anesthesia complication 11/18/23; pt unaware what happened   . Dehiscence of amputation stump (HCC)    right  . Diabetes mellitus without complication (HCC)    Type 1  . Heart murmur    "years ago"  .  Hypertension    "years ago"  . 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, with long-term current use of insulin [facility])     Past Surgical History:  Procedure Laterality Date  . AMPUTATION Left 01/07/2015   Procedure: Left Foot 1st Ray Amputation;  Surgeon: Newt Minion, MD;  Location: [place];  Service: Orthopedics;  Laterality: Left;  . AMPUTATION Left 07/15/2015   Procedure: Left Transmetatarsal Amputation;  Surgeon: Newt Minion, MD;  Location: [place];  Service: Orthopedics;  Laterality: Left;  . AMPUTATION Right 12/30/2015   Procedure: 5th Ray Amputation Right Foot;  Surgeon: Newt Minion, MD;  Location: [place];  Service: Orthopedics;  Laterality: Right;  . AMPUTATION Right 01/15/2016   Procedure: GUILLOTINE AMPUTATION  ABOVE THE ANKLE AMPUTATION;  Surgeon: Marybelle Killings, MD;  Location: [place];  Service: Orthopedics;  Laterality: Right;  . AMPUTATION Right 01/18/2016   Procedure: AMPUTATION BELOW KNEE;  Surgeon: Marybelle Killings, MD;  Location: [place];  Service: Orthopedics;  Laterality: Right;  . STUMP REVISION Right 03/21/2016   Procedure: Right Below Knee Amputation Stump Revision, WOUND VAC application;  Surgeon: Marybelle Killings, MD;  Location: [place];  Service: Orthopedics;  Laterality: Right;    Social History Social History   Social History  . Marital status: Married    Spouse name: N/A  . Number of children: N/A  . Years of education: N/A  Occupational History  . Not on file.   Social History Main Topics  . Smoking status: Never Smoker  . Smokeless tobacco: Never Used  . Alcohol use No  . Drug use: No  . Sexual activity: Not on file   Other Topics Concern  . Not on file   Social History Narrative  . No narrative on file     No Known Allergies  Family History  Problem Relation Age of Onset  . Hypertension Mother   . Diabetes Mother   . Cancer Mother   . Prostate cancer Father       Prior to Admission medications   Medication Sig  Start Date End Date Taking? Authorizing Provider  aspirin EC 81 MG tablet Take 81 mg by mouth daily.   Yes [provider]  atorvastatin (LIPITOR) 20 MG tablet Take 1 tablet (20 mg total) by mouth daily. 01/27/16  Yes Angiulli, Lavon Paganini, PA-C  hydroxypropyl methylcellulose / hypromellose (ISOPTO TEARS / GONIOVISC) 2.5 % ophthalmic solution Place 1 drop into both eyes 4 (four) times daily as needed for dry eyes.   Yes [provider]  insulin aspart (NOVOLOG FLEXPEN) 100 UNIT/ML FlexPen 3 times a day (just before each meal), 7-7-9 units, and pen needles 3/day Patient taking differently: Inject 9 Units into the skin 3 (three) times daily with meals.  06/19/16  Yes Renato Shin, MD  insulin glargine (LANTUS) 100 UNIT/ML injection Inject 0.13 mLs (13 Units total) into the skin at bedtime. Patient taking differently: Inject 17 Units into the skin at bedtime.  06/19/16  Yes Renato Shin, MD  lisinopril (PRINIVIL,ZESTRIL) 10 MG tablet Take 1 tablet (10 mg total) by mouth daily. 01/27/16  Yes Angiulli, Lavon Paganini, PA-C  Multiple Vitamin (MULTIVITAMIN WITH MINERALS) TABS tablet Take 1 tablet by mouth daily.   Yes [provider]  nitroGLYCERIN (NITRODUR - DOSED IN MG/24 HR) 0.2 mg/hr patch APPLY 1 PATCH TOPICALLY ONCE DAILY 09/21/16  Yes Dondra Prader R, NP  ferrous sulfate 325 (65 FE) MG tablet Take 1 tablet (325 mg total) by mouth 2 (two) times daily with a meal. Patient not taking: Reported on 10/03/2016 01/27/16   Angiulli, Lavon Paganini, PA-C  methocarbamol (ROBAXIN) 500 MG tablet Take 1 tablet (500 mg total) by mouth every 6 (six) hours as needed for muscle spasms. Patient not taking: Reported on 08/15/2016 03/23/16   Lanae Crumbly, PA-C  oxyCODONE-acetaminophen (PERCOCET) 7.5-325 MG tablet Take 1 tablet by mouth every 6 (six) hours as needed for severe pain. Patient not taking: Reported on 08/15/2016 03/23/16   Lanae Crumbly, PA-C    Physical Exam:  Vitals:   10/03/16 1130 10/03/16 1230  10/03/16 1245 10/03/16 1315  BP: (!) 198/89 (!) 199/91 (!) 196/100 (!) 198/87  Pulse: (!) 102 (!) 103 100 (!) 104  Resp: 16 16 16    Temp:      TempSrc:      SpO2: 98% 98% 97% 94%  Weight:      Height:       Constitutional: NAD, comfortable  Eyes: PERRL, lids and conjunctivae normal ENMT: Mucous membranes are moist, without exudate or lesions  Neck: normal, supple, no masses, no thyromegaly Respiratory: clear to auscultation bilaterally, no wheezing, no crackles. Normal respiratory effort  Cardiovascular: Regular rate and rhythm,  murmur, rubs or gallops. No extremity edema. 2+ pedal pulses. No carotid bruits.  Abdomen: Soft, non tender, No hepatosplenomegaly. Bowel sounds positive.  Musculoskeletal: no clubbing / cyanosis. Moves all extremities. RLE  BKA Skin: no jaundice, No lesions.  Neurologic: Sensation intact  Strength equal in all extremities Psychiatric:   Alert and oriented x 3. Mildly anxious mood.     Labs on Admission: I have personally reviewed following labs and imaging studies  CBC:  Recent Labs Lab 10/03/16 0140  WBC 10.0  HGB 13.7  HCT 38.9*  MCV 82.6  PLT 950    Basic Metabolic Panel:  Recent Labs Lab 10/03/16 0140  NA 134*  K 3.6  CL 100*  CO2 24  GLUCOSE 281*  BUN 21*  CREATININE 1.19  CALCIUM 9.4    GFR: Estimated Creatinine Clearance: 82.4 mL/min (by C-G formula based on SCr of 1.19 mg/dL).  Liver Function Tests:  Recent Labs Lab 10/03/16 0140  AST 42*  ALT 69*  ALKPHOS 99  BILITOT 1.7*  PROT 7.5  ALBUMIN 4.0    Recent Labs Lab 10/03/16 0140  LIPASE 26   No results for input(s): AMMONIA in the last 168 hours.  Coagulation Profile: No results for input(s): INR, PROTIME in the last 168 hours.  Cardiac Enzymes: No results for input(s): CKTOTAL, CKMB, CKMBINDEX, TROPONINI in the last 168 hours.  BNP (last 3 results) No results for input(s): PROBNP in the last 8760 hours.  HbA1C: No results for input(s): HGBA1C in  the last 72 hours.  CBG:  Recent Labs Lab 10/03/16 0807  GLUCAP 291*    Lipid Profile: No results for input(s): CHOL, HDL, LDLCALC, TRIG, CHOLHDL, LDLDIRECT in the last 72 hours.  Thyroid Function Tests: No results for input(s): TSH, T4TOTAL, FREET4, T3FREE, THYROIDAB in the last 72 hours.  Anemia Panel: No results for input(s): VITAMINB12, FOLATE, FERRITIN, TIBC, IRON, RETICCTPCT in the last 72 hours.  Urine analysis:    Component Value Date/Time   COLORURINE YELLOW 10/03/2016 0144   APPEARANCEUR CLEAR 10/03/2016 0144   LABSPEC 1.029 10/03/2016 0144   PHURINE 5.0 10/03/2016 0144   GLUCOSEU >=500 (A) 10/03/2016 0144   HGBUR NEGATIVE 10/03/2016 0144   BILIRUBINUR NEGATIVE 10/03/2016 0144   KETONESUR 80 (A) 10/03/2016 0144   PROTEINUR 100 (A) 10/03/2016 0144   NITRITE NEGATIVE 10/03/2016 0144   LEUKOCYTESUR NEGATIVE 10/03/2016 0144    Sepsis Labs: @LABRCNTIP (procalcitonin:4,lacticidven:4) )No results found for this or any previous visit (from the past 240 hour(s)).   Radiological Exams on Admission: US Abdomen Limited  Result Date: 10/03/2016 CLINICAL DATA:  Three-day history of right upper quadrant pain EXAM: ULTRASOUND ABDOMEN LIMITED RIGHT UPPER QUADRANT COMPARISON:  January 15, 2016 FINDINGS: Gallbladder: There is layering sludge in the gallbladder. There are no echogenic foci which move and shadow as is indicative of gallstones. There is no appreciable gallbladder wall thickening or pericholecystic fluid. No sonographic Murphy sign noted by sonographer. Common bile duct: Diameter: 2 mm. No intrahepatic or extrahepatic biliary duct dilatation. Liver: No focal lesion identified. Within normal limits in parenchymal echogenicity. Portal vein is patent on color Doppler imaging with normal direction of blood flow towards the liver. IMPRESSION: Layering sludge in gallbladder. No gallstones evident. It should be noted that small gallstones could be obscured by this degree of  sludge. No gallbladder wall thickening or pericholecystic fluid. Study otherwise unremarkable. Electronically Signed   By: Lowella Grip III M.D.   On: 10/03/2016 09:43    EKG: Independently reviewed.  Assessment/Plan Active Problems:   RUQ pain   Transaminitis   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, with long-term current use of insulin (HCC)   Hyperbilirubinemia  Status post below knee amputation of right lower extremity (HCC)   Hypertension   RUQ Abdominal pain, suspect of Gallblader etiology.    Abd US shows sludge but no discrete lithiasis, no apparent cholecystitis, Afebrile, normal WBC. VSS  Lipase 26 Admit to Med Surg. He received IV anti-emetics, IV fluids, and pain control, with improvement of his symptoms. Admit to Medsurg HIDA scan   NPO until seen by Surgery.    IVF while NPO. No antibiotics for now Continue pain control and IV fluids, antiemetics CBC in am   Hypertension BP  198/87   Pulse 104  new vitals are pending. The patient did not get to take his morning medications prior to being seen at the ED. Continue home anti-hypertensive medications  Add Hydralazine Q8  hours as needed for BP 160/90   Hyperlipidemia Continue home statins Check lipid panel   Abnormal LFTs.   No history of  hepatitis per chart  Alk Phos 99 , AST/ALT 42-69 /. Bili 1.7, in the setting of above. The patient is receiving IV fluids. Height that scan is pending. Hepatitis panel Repeat LFTs in am IVF   TypeI Diabetes Current blood sugar level is 281 Lab Results  Component Value Date   HGBA1C 7.2 06/19/2016  Hgb A1C Lantus , SSI   DVT prophylaxis:  SCD Code Status:    Full code Family Communication:  Discussed with patient Disposition Plan: Expect patient to be discharged to home after condition improves Consults called:    General surgery Admission status: MedSurg observation   Rondel Jumbo, PA-C [facility]   10/03/2016,  2:14 PM

## 2016-10-03 NOTE — Consult Note (Signed)
[facility] Consult Note  Gordon Walker 1965-12-01  [id number].    Requesting MD: Nat Christen Chief Complaint/Reason for Consult: Elevated LFTs  HPI:  Gordon Walker is a 51yo male who presented to the [facility] earlier today complaining of 2 days of progressive RUQ pain. States that the pain has been intermittent, not necessarily worse with PO intake, but this morning it became sharp, severe, and constant. It is in his RUQ and does not radiate. Associated with nausea and vomiting. Denies fever, chills, dysuria, diarrhea. Last BM was yesterday and normal. He also reports anorexia, and his last meal was on Monday. States that he had similar pain once several months ago, but it resolved without intervention. Patient was seen by his PCP where LFTs were found to be slightly elevated. He was scheduled for an outpatient ultrasound but due to the pain he could not wait for his appointment and came to the ED.  Hospital workup: - u/s shows layering sludge in gallbladder, no gallstones, no gallbladder wall thickening or pericholecystic fluid - AST 42, ALT 69, bilirubin 1.7 - WBC 10  PMH significant for DM-I, HTN Abdominal surgical history: none Anticoagulants: none Nonsmoker Employment: works in Sales executive  ROS: Review of Systems  Constitutional: Negative.   HENT: Negative.   Eyes: Negative.   Respiratory: Negative.   Cardiovascular: Negative.   Gastrointestinal: Positive for abdominal pain, nausea and vomiting. Negative for constipation, diarrhea and heartburn.  Genitourinary: Negative.   Musculoskeletal: Negative.   Skin: Negative.   Neurological: Negative.   All systems reviewed and otherwise negative except for as above*  Family History  Problem Relation Age of Onset  . Hypertension Mother   . Diabetes Mother   . Cancer Mother   . Prostate cancer Father     Past Medical History:  Diagnosis Date  . Complication of anesthesia    anesthesia complication  34/19/62; pt unaware what happened   . Dehiscence of amputation stump (HCC)    right  . Diabetes mellitus without complication (HCC)    Type 1  . Heart murmur    "years ago"  . Hypertension    "years ago"  . 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, with long-term current use of insulin [facility])     Past Surgical History:  Procedure Laterality Date  . AMPUTATION Left 01/07/2015   Procedure: Left Foot 1st Ray Amputation;  Surgeon: Newt Minion, MD;  Location: [place];  Service: Orthopedics;  Laterality: Left;  . AMPUTATION Left 07/15/2015   Procedure: Left Transmetatarsal Amputation;  Surgeon: Newt Minion, MD;  Location: [place];  Service: Orthopedics;  Laterality: Left;  . AMPUTATION Right 12/30/2015   Procedure: 5th Ray Amputation Right Foot;  Surgeon: Newt Minion, MD;  Location: [place];  Service: Orthopedics;  Laterality: Right;  . AMPUTATION Right 01/15/2016   Procedure: GUILLOTINE AMPUTATION  ABOVE THE ANKLE AMPUTATION;  Surgeon: Marybelle Killings, MD;  Location: [place];  Service: Orthopedics;  Laterality: Right;  . AMPUTATION Right 01/18/2016   Procedure: AMPUTATION BELOW KNEE;  Surgeon: Marybelle Killings, MD;  Location: [place];  Service: Orthopedics;  Laterality: Right;  . STUMP REVISION Right 03/21/2016   Procedure: Right Below Knee Amputation Stump Revision, WOUND VAC application;  Surgeon: Marybelle Killings, MD;  Location: [place];  Service: Orthopedics;  Laterality: Right;    Social History:  reports that he has never smoked. He has never used smokeless tobacco. He reports that he does not  drink alcohol or use drugs.  Allergies: No Known Allergies   (Not in a hospital admission)  Prior to Admission medications   Medication Sig Start Date End Date Taking? Authorizing Provider  aspirin EC 81 MG tablet Take 81 mg by mouth daily.   Yes [provider]  atorvastatin (LIPITOR) 20 MG tablet Take 1 tablet (20 mg total) by mouth daily. 01/27/16  Yes Angiulli,  Lavon Paganini, PA-C  hydroxypropyl methylcellulose / hypromellose (ISOPTO TEARS / GONIOVISC) 2.5 % ophthalmic solution Place 1 drop into both eyes 4 (four) times daily as needed for dry eyes.   Yes [provider]  insulin aspart (NOVOLOG FLEXPEN) 100 UNIT/ML FlexPen 3 times a day (just before each meal), 7-7-9 units, and pen needles 3/day Patient taking differently: Inject 9 Units into the skin 3 (three) times daily with meals.  06/19/16  Yes Renato Shin, MD  insulin glargine (LANTUS) 100 UNIT/ML injection Inject 0.13 mLs (13 Units total) into the skin at bedtime. Patient taking differently: Inject 17 Units into the skin at bedtime.  06/19/16  Yes Renato Shin, MD  lisinopril (PRINIVIL,ZESTRIL) 10 MG tablet Take 1 tablet (10 mg total) by mouth daily. 01/27/16  Yes Angiulli, Lavon Paganini, PA-C  Multiple Vitamin (MULTIVITAMIN WITH MINERALS) TABS tablet Take 1 tablet by mouth daily.   Yes [provider]  nitroGLYCERIN (NITRODUR - DOSED IN MG/24 HR) 0.2 mg/hr patch APPLY 1 PATCH TOPICALLY ONCE DAILY 09/21/16  Yes Dondra Prader R, NP  ferrous sulfate 325 (65 FE) MG tablet Take 1 tablet (325 mg total) by mouth 2 (two) times daily with a meal. Patient not taking: Reported on 10/03/2016 01/27/16   Angiulli, Lavon Paganini, PA-C  methocarbamol (ROBAXIN) 500 MG tablet Take 1 tablet (500 mg total) by mouth every 6 (six) hours as needed for muscle spasms. Patient not taking: Reported on 08/15/2016 03/23/16   Lanae Crumbly, PA-C  oxyCODONE-acetaminophen (PERCOCET) 7.5-325 MG tablet Take 1 tablet by mouth every 6 (six) hours as needed for severe pain. Patient not taking: Reported on 08/15/2016 03/23/16   Lanae Crumbly, PA-C    Blood pressure (!) 198/87, pulse (!) 104, temperature 98.4 F (36.9 C), temperature source Oral, resp. rate 16, height 6' (1.829 m), weight 175 lb (79.4 kg), SpO2 94 %. Physical Exam: General: pleasant, WD/WN AA male who is laying in bed in NAD HEENT: head is normocephalic, atraumatic.   Sclera are noninjected.  Pupils equal and round.  Ears and nose without any masses or lesions.  Mouth is pink and moist. Dentition fair Heart: regular, rate, and rhythm.  Palpable pedal pulse LLE. Lungs: CTAB, no wheezes, rhonchi, or rales noted.  Respiratory effort nonlabored Abd: soft, ND, mild RUQ tenderness with no rebound or guarding, +BS, no masses, hernias, or organomegaly MS: RLE s/p BKA. LLE s/p transmetatarsal amputation. No erythema or edema noted to BUE Skin: warm and dry with no masses, lesions, or rashes Psych: A&Ox3 with an appropriate affect. Neuro: cranial nerves grossly intact, extremity CSM intact bilaterally, normal speech  Results for orders placed or performed during the hospital encounter of 10/03/16 (from the past 48 hour(s))  Lipase, blood     Status: None   Collection Time: 10/03/16  1:40 AM  Result Value Ref Range   Lipase 26 11 - 51 U/L  Comprehensive metabolic panel     Status: Abnormal   Collection Time: 10/03/16  1:40 AM  Result Value Ref Range   Sodium 134 (L) 135 - 145 mmol/L  Potassium 3.6 3.5 - 5.1 mmol/L   Chloride 100 (L) 101 - 111 mmol/L   CO2 24 22 - 32 mmol/L   Glucose, Bld 281 (H) 65 - 99 mg/dL   BUN 21 (H) 6 - 20 mg/dL   Creatinine, Ser 1.19 0.61 - 1.24 mg/dL   Calcium 9.4 8.9 - 10.3 mg/dL   Total Protein 7.5 6.5 - 8.1 g/dL   Albumin 4.0 3.5 - 5.0 g/dL   AST 42 (H) 15 - 41 U/L   ALT 69 (H) 17 - 63 U/L   Alkaline Phosphatase 99 38 - 126 U/L   Total Bilirubin 1.7 (H) 0.3 - 1.2 mg/dL   GFR calc non Af Amer >60 >60 mL/min   GFR calc Af Amer >60 >60 mL/min    Comment: (NOTE) The eGFR has been calculated using the CKD EPI equation. This calculation has not been validated in all clinical situations. eGFR's persistently <60 mL/min signify possible Chronic Kidney Disease.    Anion gap 10 5 - 15  CBC     Status: Abnormal   Collection Time: 10/03/16  1:40 AM  Result Value Ref Range   WBC 10.0 4.0 - 10.5 K/uL   RBC 4.71 4.22 - 5.81 MIL/uL    Hemoglobin 13.7 13.0 - 17.0 g/dL   HCT 38.9 (L) 39.0 - 52.0 %   MCV 82.6 78.0 - 100.0 fL   MCH 29.1 26.0 - 34.0 pg   MCHC 35.2 30.0 - 36.0 g/dL   RDW 12.3 11.5 - 15.5 %   Platelets 271 150 - 400 K/uL  Urinalysis, Routine w reflex microscopic     Status: Abnormal   Collection Time: 10/03/16  1:44 AM  Result Value Ref Range   Color, Urine YELLOW YELLOW   APPearance CLEAR CLEAR   Specific Gravity, Urine 1.029 1.005 - 1.030   pH 5.0 5.0 - 8.0   Glucose, UA >=500 (A) NEGATIVE mg/dL   Hgb urine dipstick NEGATIVE NEGATIVE   Bilirubin Urine NEGATIVE NEGATIVE   Ketones, ur 80 (A) NEGATIVE mg/dL   Protein, ur 100 (A) NEGATIVE mg/dL   Nitrite NEGATIVE NEGATIVE   Leukocytes, UA NEGATIVE NEGATIVE   RBC / HPF 0-5 0 - 5 RBC/hpf   WBC, UA 0-5 0 - 5 WBC/hpf   Bacteria, UA RARE (A) NONE SEEN   Squamous Epithelial / LPF 0-5 (A) NONE SEEN   Mucus PRESENT   CBG monitoring, ED     Status: Abnormal   Collection Time: 10/03/16  8:07 AM  Result Value Ref Range   Glucose-Capillary 291 (H) 65 - 99 mg/dL   US Abdomen Limited  Result Date: 10/03/2016 CLINICAL DATA:  Three-day history of right upper quadrant pain EXAM: ULTRASOUND ABDOMEN LIMITED RIGHT UPPER QUADRANT COMPARISON:  January 15, 2016 FINDINGS: Gallbladder: There is layering sludge in the gallbladder. There are no echogenic foci which move and shadow as is indicative of gallstones. There is no appreciable gallbladder wall thickening or pericholecystic fluid. No sonographic Murphy sign noted by sonographer. Common bile duct: Diameter: 2 mm. No intrahepatic or extrahepatic biliary duct dilatation. Liver: No focal lesion identified. Within normal limits in parenchymal echogenicity. Portal vein is patent on color Doppler imaging with normal direction of blood flow towards the liver. IMPRESSION: Layering sludge in gallbladder. No gallstones evident. It should be noted that small gallstones could be obscured by this degree of sludge. No gallbladder wall  thickening or pericholecystic fluid. Study otherwise unremarkable. Electronically Signed   By: Lowella Grip III M.D.  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 Hepatitis panel also pending. Will continue to follow.  Wellington Hampshire, [facility] 10/03/2016, 2:10 PM Pager: [phone] Consults: [phone] Mon-Fri 7:00 am-4:30 pm Sat-Sun 7:00 am-11:30 am

## 2016-10-03 NOTE — ED Notes (Signed)
Patient continues to vomit. EDP aware

## 2016-10-03 NOTE — ED Notes (Signed)
EDP aware of patient blood pressure.  

## 2016-10-03 NOTE — ED Triage Notes (Signed)
RUQ abdominal pain x2 days, emesis with dark blood as well. Denies pain right now. Hx diabetes, PCP is looking at liver d/t hx elevated labs.

## 2016-10-03 NOTE — ED Provider Notes (Addendum)
[facility]PT Provider Note   CSN: [id number] Arrival date & time: 10/03/16  0058     History   Chief Complaint Chief Complaint  Patient presents with  . Abdominal Pain  . Emesis    HPI Gordon Walker is a 51 y.o. male.  Right upper quadrant pain for the past 2 days with associated nausea and vomiting. He does report some blood in the emesis.  Patient is a type I diabetic.  His liver functions were elevated recently. He was scheduled get an ultrasound of his gallbladder.  He does report some tenderness in the right upper quadrant. Severity of symptoms is moderate. Nothing makes symptoms better or worse.      Past Medical History:  Diagnosis Date  . Complication of anesthesia    anesthesia complication 01/18/16; pt unaware what happened   . Dehiscence of amputation stump (HCC)    right  . Diabetes mellitus without complication (HCC)    Type 1  . Heart murmur    "years ago"  . Hypertension    "years ago"  . 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(HCC)     Patient Active Problem List   Diagnosis Date Noted  . Wound dehiscence, surgical   . Acute blood loss anemia 01/20/2016  . Status post below knee amputation of right lower extremity (HCC) 01/20/2016  . Hyponatremia 01/17/2016  . AKI (acute kidney injury) (HCC) 01/17/2016  . Hyperbilirubinemia 01/17/2016  . Bacteremia due to Escherichia coli 01/17/2016  . Sepsis (HCC) 01/17/2016  . DKA (diabetic ketoacidoses) (HCC) 01/15/2016  . Controlled diabetes mellitus with diabetic peripheral angiopathy and gangrene (HCC)   . 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 Transaminitis 01/07/2015  . Osteomyelitis of left foot (HCC) 01/06/2015  . Uncontrolled diabetes mellitus with foot ulcer, with long-term current use of insulin (HCC) 01/06/2015  . Anemia 01/06/2015    . Osteomyelitis (HCC) 01/05/2015    Past Surgical History:  Procedure Laterality Date  . AMPUTATION Left 01/07/2015   Procedure: Left Foot 1st Ray Amputation;  Surgeon: Nadara MustardMarcus Duda V, MD;  [facility];  Service: Orthopedics;  Laterality: Left;  . AMPUTATION Left 07/15/2015   Procedure: Left Transmetatarsal Amputation;  Surgeon: Nadara MustardMarcus V Duda, MD;  [facility];  Service: Orthopedics;  Laterality: Left;  . AMPUTATION Right 12/30/2015   Procedure: 5th Ray Amputation Right Foot;  Surgeon: Nadara MustardMarcus V Duda, MD;  [facility];  Service: Orthopedics;  Laterality: Right;  . AMPUTATION Right 01/15/2016   Procedure: GUILLOTINE AMPUTATION  ABOVE THE ANKLE AMPUTATION;  Surgeon: Eldred MangesMark C Yates, MD;  [facility];  Service: Orthopedics;  Laterality: Right;  . AMPUTATION Right 01/18/2016   Procedure: AMPUTATION BELOW KNEE;  Surgeon: Eldred MangesMark C Yates, MD;  [facility];  Service: Orthopedics;  Laterality: Right;  . STUMP REVISION Right 03/21/2016   Procedure: Right Below Knee Amputation Stump Revision, WOUND VAC application;  Surgeon: Eldred MangesMark C Yates, MD;  [facility];  Service: Orthopedics;  Laterality: Right;       Home Medications    Prior to Admission medications   Medication Sig Start Date End Date Taking? Authorizing Provider  aspirin EC 81 MG tablet Take 81 mg by mouth daily.   Yes [provider]  atorvastatin (LIPITOR) 20 MG tablet Take 1 tablet (20 mg total) by mouth daily. 01/27/16  Yes Mariam DollarAngiulli, Daniel  J, PA-C  hydroxypropyl methylcellulose / hypromellose (ISOPTO TEARS / GONIOVISC) 2.5 % ophthalmic solution Place 1 drop into both eyes 4 (four) times daily as needed for dry eyes.   Yes [provider]  insulin aspart (NOVOLOG FLEXPEN) 100 UNIT/ML FlexPen 3 times a day (just before each meal), 7-7-9 units, and pen needles 3/day Patient taking differently: Inject 9 Units into the skin 3 (three) times daily with meals.  06/19/16  Yes Romero Belling, MD  insulin glargine  (LANTUS) 100 UNIT/ML injection Inject 0.13 mLs (13 Units total) into the skin at bedtime. Patient taking differently: Inject 17 Units into the skin at bedtime.  06/19/16  Yes Romero Belling, MD  lisinopril (PRINIVIL,ZESTRIL) 10 MG tablet Take 1 tablet (10 mg total) by mouth daily. 01/27/16  Yes Angiulli, Mcarthur Rossetti, PA-C  Multiple Vitamin (MULTIVITAMIN WITH MINERALS) TABS tablet Take 1 tablet by mouth daily.   Yes [provider]  nitroGLYCERIN (NITRODUR - DOSED IN MG/24 HR) 0.2 mg/hr patch APPLY 1 PATCH TOPICALLY ONCE DAILY 09/21/16  Yes Barnie Del R, NP  ferrous sulfate 325 (65 FE) MG tablet Take 1 tablet (325 mg total) by mouth 2 (two) times daily with a meal. Patient not taking: Reported on 10/03/2016 01/27/16   Angiulli, Mcarthur Rossetti, PA-C  methocarbamol (ROBAXIN) 500 MG tablet Take 1 tablet (500 mg total) by mouth every 6 (six) hours as needed for muscle spasms. Patient not taking: Reported on 08/15/2016 03/23/16   Naida Sleight, PA-C  oxyCODONE-acetaminophen (PERCOCET) 7.5-325 MG tablet Take 1 tablet by mouth every 6 (six) hours as needed for severe pain. Patient not taking: Reported on 08/15/2016 03/23/16   Naida Sleight, PA-C    Family History Family History  Problem Relation Age of Onset  . Hypertension Mother   . Diabetes Mother   . Cancer Mother   . Prostate cancer Father     Social History Social History  Substance Use Topics  . Smoking status: Never Smoker  . Smokeless tobacco: Never Used  . Alcohol use No     Allergies   Patient has no known allergies.   Review of Systems Review of Systems  All other systems reviewed and are negative.    Physical Exam Updated Vital Signs BP (!) 199/91   Pulse (!) 103   Temp 98.4 F (36.9 C) (Oral)   Resp 16   Ht 6' (1.829 m)   Wt 79.4 kg (175 lb)   SpO2 98%   BMI 23.73 kg/m   Physical Exam  Constitutional: He is oriented to person, place, and time.  Vomiting;  No obvious blood.  HENT:  Head: Normocephalic and  atraumatic.  Eyes: Conjunctivae are normal.  Neck: Neck supple.  Cardiovascular: Normal rate and regular rhythm.   Pulmonary/Chest: Effort normal and breath sounds normal.  Abdominal:  Tender RUQ  Musculoskeletal: Normal range of motion.  Neurological: He is alert and oriented to person, place, and time.  Skin: Skin is warm and dry.  Psychiatric: He has a normal mood and affect. His behavior is normal.  Nursing note and vitals reviewed.    ED Treatments / Results  Labs (all labs ordered are listed, but only abnormal results are displayed) Labs Reviewed  COMPREHENSIVE METABOLIC PANEL - Abnormal; Notable for the following:       Result Value   Sodium 134 (*)    Chloride 100 (*)    Glucose, Bld 281 (*)    BUN 21 (*)    AST 42 (*)  ALT 69 (*)    Total Bilirubin 1.7 (*)    All other components within normal limits  CBC - Abnormal; Notable for the following:    HCT 38.9 (*)    All other components within normal limits  URINALYSIS, ROUTINE W REFLEX MICROSCOPIC - Abnormal; Notable for the following:    Glucose, UA >=500 (*)    Ketones, ur 80 (*)    Protein, ur 100 (*)    Bacteria, UA RARE (*)    Squamous Epithelial / LPF 0-5 (*)    All other components within normal limits  CBG MONITORING, ED - Abnormal; Notable for the following:    Glucose-Capillary 291 (*)    All other components within normal limits  LIPASE, BLOOD    EKG  EKG Interpretation None       Radiology US Abdomen Limited  Result Date: 10/03/2016 CLINICAL DATA:  Three-day history of right upper quadrant pain EXAM: ULTRASOUND ABDOMEN LIMITED RIGHT UPPER QUADRANT COMPARISON:  January 15, 2016 FINDINGS: Gallbladder: There is layering sludge in the gallbladder. There are no echogenic foci which move and shadow as is indicative of gallstones. There is no appreciable gallbladder wall thickening or pericholecystic fluid. No sonographic Murphy sign noted by sonographer. Common bile duct: Diameter: 2 mm. No  intrahepatic or extrahepatic biliary duct dilatation. Liver: No focal lesion identified. Within normal limits in parenchymal echogenicity. Portal vein is patent on color Doppler imaging with normal direction of blood flow towards the liver. IMPRESSION: Layering sludge in gallbladder. No gallstones evident. It should be noted that small gallstones could be obscured by this degree of sludge. No gallbladder wall thickening or pericholecystic fluid. Study otherwise unremarkable. Electronically Signed   By: Bretta Bang III M.D.   On: 10/03/2016 09:43    Procedures Procedures (including critical care time)  Medications Ordered in ED Medications  ondansetron (ZOFRAN-ODT) disintegrating tablet 4 mg (4 mg Oral Given 10/03/16 0135)  sodium chloride 0.9 % bolus 1,000 mL (1,000 mLs Intravenous New Bag/Given 10/03/16 0859)  ondansetron (ZOFRAN) injection 4 mg (4 mg Intravenous Given 10/03/16 0852)  pantoprazole (PROTONIX) injection 40 mg (40 mg Intravenous Given 10/03/16 0846)  sodium chloride 0.9 % bolus 1,000 mL (1,000 mLs Intravenous New Bag/Given 10/03/16 0845)  promethazine (PHENERGAN) injection 12.5 mg (12.5 mg Intravenous Given 10/03/16 1030)     Initial Impression / Assessment and Plan / ED Course  I have reviewed the triage vital signs and the nursing notes.  Pertinent labs & imaging results that were available during my care of the patient were reviewed by me and considered in my medical decision making (see chart for details).     Patient is feeling better with hydration and antiemetics. Will consult general surgery and admit to the hospitalist.  Final Clinical Impressions(s) / ED Diagnoses   Final diagnoses:  Intractable vomiting with nausea, unspecified vomiting type  Elevated liver function tests  Sludge in gallbladder    New Prescriptions New Prescriptions   No medications on file     Donnetta Hutching, MD 10/03/16 1246    Donnetta Hutching, MD 10/03/16 1414

## 2016-10-03 NOTE — ED Notes (Signed)
EDP aware of blood pressure ?

## 2016-10-03 NOTE — ED Notes (Signed)
Patient in nuclear medicine.

## 2016-10-04 ENCOUNTER — Observation Stay (HOSPITAL_COMMUNITY): Payer: BLUE CROSS/BLUE SHIELD

## 2016-10-04 DIAGNOSIS — R7989 Other specified abnormal findings of blood chemistry: Secondary | ICD-10-CM | POA: Diagnosis not present

## 2016-10-04 DIAGNOSIS — I10 Essential (primary) hypertension: Secondary | ICD-10-CM | POA: Diagnosis not present

## 2016-10-04 DIAGNOSIS — K828 Other specified diseases of gallbladder: Secondary | ICD-10-CM | POA: Diagnosis not present

## 2016-10-04 DIAGNOSIS — R1011 Right upper quadrant pain: Secondary | ICD-10-CM | POA: Diagnosis not present

## 2016-10-04 DIAGNOSIS — R112 Nausea with vomiting, unspecified: Secondary | ICD-10-CM | POA: Diagnosis not present

## 2016-10-04 LAB — URINALYSIS, ROUTINE W REFLEX MICROSCOPIC
Bacteria, UA: NONE SEEN
Bilirubin Urine: NEGATIVE
Hgb urine dipstick: NEGATIVE
Ketones, ur: 20 mg/dL — AB
Leukocytes, UA: NEGATIVE
Nitrite: NEGATIVE
PROTEIN: 30 mg/dL — AB
SPECIFIC GRAVITY, URINE: 1.023 (ref 1.005–1.030)
SQUAMOUS EPITHELIAL / LPF: NONE SEEN
pH: 5 (ref 5.0–8.0)

## 2016-10-04 LAB — CBC
HCT: 38.9 % — ABNORMAL LOW (ref 39.0–52.0)
HEMOGLOBIN: 13.6 g/dL (ref 13.0–17.0)
MCH: 29.6 pg (ref 26.0–34.0)
MCHC: 35 g/dL (ref 30.0–36.0)
MCV: 84.7 fL (ref 78.0–100.0)
PLATELETS: 239 10*3/uL (ref 150–400)
RBC: 4.59 MIL/uL (ref 4.22–5.81)
RDW: 13.2 % (ref 11.5–15.5)
WBC: 9.7 10*3/uL (ref 4.0–10.5)

## 2016-10-04 LAB — GLUCOSE, CAPILLARY
GLUCOSE-CAPILLARY: 227 mg/dL — AB (ref 65–99)
GLUCOSE-CAPILLARY: 225 mg/dL — AB (ref 65–99)
GLUCOSE-CAPILLARY: 239 mg/dL — AB (ref 65–99)
Glucose-Capillary: 299 mg/dL — ABNORMAL HIGH (ref 65–99)

## 2016-10-04 LAB — HEPATITIS PANEL, ACUTE
HEP A IGM: NEGATIVE
HEP B C IGM: NEGATIVE
HEP B S AG: NEGATIVE

## 2016-10-04 LAB — HEMOGLOBIN A1C
Hgb A1c MFr Bld: 7.1 % — ABNORMAL HIGH (ref 4.8–5.6)
MEAN PLASMA GLUCOSE: 157.07 mg/dL

## 2016-10-04 LAB — COMPREHENSIVE METABOLIC PANEL
ALT: 47 U/L (ref 17–63)
ANION GAP: 9 (ref 5–15)
AST: 20 U/L (ref 15–41)
Albumin: 3.4 g/dL — ABNORMAL LOW (ref 3.5–5.0)
Alkaline Phosphatase: 99 U/L (ref 38–126)
BUN: 22 mg/dL — ABNORMAL HIGH (ref 6–20)
CHLORIDE: 103 mmol/L (ref 101–111)
CO2: 24 mmol/L (ref 22–32)
CREATININE: 1.24 mg/dL (ref 0.61–1.24)
Calcium: 8.9 mg/dL (ref 8.9–10.3)
Glucose, Bld: 302 mg/dL — ABNORMAL HIGH (ref 65–99)
Potassium: 4.3 mmol/L (ref 3.5–5.1)
Sodium: 136 mmol/L (ref 135–145)
Total Bilirubin: 1.2 mg/dL (ref 0.3–1.2)
Total Protein: 6.9 g/dL (ref 6.5–8.1)

## 2016-10-04 MED ORDER — PANTOPRAZOLE SODIUM 40 MG IV SOLR
40.0000 mg | INTRAVENOUS | Status: DC
  Administered 2016-10-04 – 2016-10-05 (×2): 40 mg via INTRAVENOUS
  Filled 2016-10-04 (×2): qty 40

## 2016-10-04 MED ORDER — INSULIN GLARGINE 100 UNIT/ML ~~LOC~~ SOLN
13.0000 [IU] | Freq: Every day | SUBCUTANEOUS | Status: DC
  Administered 2016-10-04: 13 [IU] via SUBCUTANEOUS
  Filled 2016-10-04: qty 0.13

## 2016-10-04 MED ORDER — INSULIN ASPART 100 UNIT/ML ~~LOC~~ SOLN: 0.0000 [IU] | Freq: Every day | SUBCUTANEOUS | Status: DC

## 2016-10-04 MED ORDER — INSULIN ASPART 100 UNIT/ML ~~LOC~~ SOLN
0.0000 [IU] | Freq: Three times a day (TID) | SUBCUTANEOUS | Status: DC
  Administered 2016-10-04 – 2016-10-05 (×4): 5 [IU] via SUBCUTANEOUS

## 2016-10-04 MED ORDER — PROMETHAZINE HCL 25 MG/ML IJ SOLN: 12.5000 mg | Freq: Four times a day (QID) | INTRAMUSCULAR | Status: DC | PRN

## 2016-10-04 NOTE — Progress Notes (Signed)
[facility]                                                                              Patient Demographics  Gordon Walker, is a 51 y.o. male, DOB - June 30, 1965, ZOX:[id number]  Admit date - 10/03/2016   Admitting Physician Ozella Rocks, MD  Outpatient Primary MD for the patient is Darrow Bussing, MD  Outpatient specialists:   LOS - 0  days   Medical records reviewed and are as summarized below:    Chief Complaint  Patient presents with  . Abdominal Pain  . Emesis       Brief summary   per admit note by Dr. Margot Ables on 9/12  Gordon Walker is a 51 y.o. male with medical history significant for HTN, HLD,DM1,s/p RLE BKA due to poor vascular issues,  presenting with 2 day history of right upper quadrant pain, accompanied by nausea and vomiting. Initially, he had seen some blood tinge in his emesis, but this has resolved prior to admission. Initially, the patient had made arrangements to have an ultrasound of the abdomen as OP,  but the symptoms were severe, thus presenting to the ED . He received IV Zofran, IV antiemetics, and pain control, with improvement of his symptoms. Denied fevers, chills, night sweats, vision changes, or mucositis. Denied any respiratory complaints. Denied any chest pain or palpitations. Denied left lower extremity swelling. Denied nausea, heartburn or change in bowel habits     Assessment & Plan    Principal Problem:   RUQ pain With transaminitis - Right upper quadrant ultrasound showed sludge In gallbladder but no gallstones, no gallstones. No gallbladder wall thickening or pericholecystic fluid - HIDA scan normal EF 52%, no need of cholecystectomy -States abdominal pain has resolved, LFTs normalized. Patient may have had gallbladder stone which passed. - Still complaining of nausea since ordered gastric and getting study to rule out gastroparesis - Denies any history of heartburn or GERD - Start clear liquid diet, advanced  as tolerated, NPO after MN for gastric emptying study   Active Problems:   Transaminitis - Resolved, possibly patient may have had a gallstone which he passed    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, with long-term current use of insulin (HCC) - CBGs and controlled, increase Lantus to 30 units at bedtime, placed on moderate sliding scale insulin    Status post below knee amputation of right lower extremity (HCC)    Hypertension - Currently stable, continue lisinopril  Code Status: Full code  DVT Prophylaxis:  SCD's Family Communication: Discussed in detail with the patient, all imaging results, lab results explained to the patient   Disposition Plan: Possible in next 24-48 hours  Time Spent in minutes  25 minutes  Procedures:  HIDA scan  RUQ [place]  Consultants:   Surgery   Antimicrobials:      Medications  Scheduled Meds: . aspirin EC  81 mg Oral Daily  . atorvastatin  20 mg Oral q1800  . insulin aspart  0-15 Units Subcutaneous TID WC  . insulin aspart  0-5 Units Subcutaneous QHS  . insulin glargine  13 Units  Subcutaneous QHS  . lisinopril  10 mg Oral Daily  . nitroGLYCERIN  0.2 mg Transdermal Daily  . pantoprazole (PROTONIX) IV  40 mg Intravenous Q24H   Continuous Infusions: . sodium chloride 100 mL/hr at 10/03/16 1837   PRN Meds:.acetaminophen **OR** acetaminophen, bisacodyl, hydrALAZINE, HYDROcodone-acetaminophen, ondansetron **OR** ondansetron (ZOFRAN) IV, promethazine, senna-docusate   Antibiotics   Anti-infectives    None        Subjective:   Gordon Walker was seen and examined today.  Still has some nausea but no vomiting or abdominal pain. No fevers or chills. Patient denies dizziness, chest pain, shortness of breath, new weakness, numbess, tingling. No acute events overnight.    Objective:   Vitals:   10/03/16 2100 10/03/16 2134 10/04/16 0500 10/04/16 1026  BP: (!) 154/82 (!) 181/86 139/68 (!) 170/86    Pulse: (!) 111 (!) 107 98 98  Resp:  18  18  Temp:  98.8 F (37.1 C) 98.4 F (36.9 C) 99 F (37.2 C)  TempSrc:  Oral Oral Oral  SpO2: 97% 99% 100% 98%  Weight:  76.7 kg (169 lb)    Height:  6' (1.829 m)      Intake/Output Summary (Last 24 hours) at 10/04/16 1133 Last data filed at 10/04/16 1030  Gross per 24 hour  Intake             2000 ml  Output             1900 ml  Net              100 ml     Wt Readings from Last 3 Encounters:  10/03/16 76.7 kg (169 lb)  08/15/16 78.5 kg (173 lb)  07/06/16 78.5 kg (173 lb)     Exam  General: Alert and oriented x 3, NAD  Eyes:   HEENT:  Atraumatic, normocephalic  Cardiovascular: S1 S2 auscultated, no rubs, murmurs or gallops. Regular rate and rhythm.  Respiratory: Clear to auscultation bilaterally, no wheezing, rales or rhonchi  Gastrointestinal: Soft, nontender, nondistended, + bowel sounds  ZOX:WRUEA lower extremity BKA   Neuro: no new deficits   Musculoskeletal: No digital cyanosis, clubbing  Skin: No rashes  Psych: Normal affect and demeanor, alert and oriented x3    Data Reviewed:  I have personally reviewed following labs and imaging studies  Micro Results No results found for this or any previous visit (from the past 240 hour(s)).  Radiology Reports X-ray Chest Pa And Lateral  Result Date: 10/04/2016 CLINICAL DATA:  Chest pain. EXAM: CHEST  2 VIEW COMPARISON:  January 14, 2016. FINDINGS: The cardiomediastinal silhouette is normal in size. Normal pulmonary vascularity. No focal consolidation, pleural effusion, or pneumothorax. No acute osseous abnormality. IMPRESSION: No active cardiopulmonary disease. Electronically Signed   By: Obie Dredge M.D.   On: 10/04/2016 08:04   Nm Hepato W/eject Fract  Result Date: 10/03/2016 CLINICAL DATA:  Right upper quadrant pain EXAM: NUCLEAR MEDICINE HEPATOBILIARY IMAGING WITH GALLBLADDER EF TECHNIQUE: Sequential images of the abdomen were obtained out to 60 minutes  following intravenous administration of radiopharmaceutical. After oral ingestion of Ensure, gallbladder ejection fraction was determined. At 60 min, normal ejection fraction is greater than 33%. RADIOPHARMACEUTICALS:  5.2 mCi Tc-42m  Choletec IV COMPARISON:  Ultrasound 10/03/2016 FINDINGS: Prompt uptake and biliary excretion of activity by the liver is seen. Gallbladder activity is visualized, but is faint. Biliary activity passes into small bowel, consistent with patent common bile duct. Calculated gallbladder ejection fraction is 52%. (Normal gallbladder  ejection fraction with Ensure is greater than 33%.) IMPRESSION: Cystic duct is patent as some activity is seen within the gallbladder although activity is faint. Gallbladder ejection fraction is normal at 52%. Electronically Signed   By: Charlett NoseKevin  Dover M.D.   On: 10/03/2016 19:07   [place] Abdomen Limited  Result Date: 10/03/2016 CLINICAL DATA:  Three-day history of right upper quadrant pain EXAM: ULTRASOUND ABDOMEN LIMITED RIGHT UPPER QUADRANT COMPARISON:  January 15, 2016 FINDINGS: Gallbladder: There is layering sludge in the gallbladder. There are no echogenic foci which move and shadow as is indicative of gallstones. There is no appreciable gallbladder wall thickening or pericholecystic fluid. No sonographic Murphy sign noted by sonographer. Common bile duct: Diameter: 2 mm. No intrahepatic or extrahepatic biliary duct dilatation. Liver: No focal lesion identified. Within normal limits in parenchymal echogenicity. Portal vein is patent on color Doppler imaging with normal direction of blood flow towards the liver. IMPRESSION: Layering sludge in gallbladder. No gallstones evident. It should be noted that small gallstones could be obscured by this degree of sludge. No gallbladder wall thickening or pericholecystic fluid. Study otherwise unremarkable. Electronically Signed   By: Bretta BangWilliam  Woodruff III M.D.   On: 10/03/2016 09:43    Lab Data:  CBC:  Recent  Labs Lab 10/03/16 0140 10/04/16 0541  WBC 10.0 9.7  HGB 13.7 13.6  HCT 38.9* 38.9*  MCV 82.6 84.7  PLT 271 239   Basic Metabolic Panel:  Recent Labs Lab 10/03/16 0140 10/04/16 0541  NA 134* 136  K 3.6 4.3  CL 100* 103  CO2 24 24  GLUCOSE 281* 302*  BUN 21* 22*  CREATININE 1.19 1.24  CALCIUM 9.4 8.9   GFR: Estimated Creatinine Clearance: 78.2 mL/min (by C-G formula based on SCr of 1.24 mg/dL). Liver Function Tests:  Recent Labs Lab 10/03/16 0140 10/04/16 0541  AST 47*  42* 20  ALT 69*  69* 47  ALKPHOS 99  99 99  BILITOT 1.8*  1.7* 1.2  PROT 7.4  7.5 6.9  ALBUMIN 3.9  4.0 3.4*    Recent Labs Lab 10/03/16 0140  LIPASE 26   No results for input(s): AMMONIA in the last 168 hours. Coagulation Profile: No results for input(s): INR, PROTIME in the last 168 hours. Cardiac Enzymes: No results for input(s): CKTOTAL, CKMB, CKMBINDEX, TROPONINI in the last 168 hours. BNP (last 3 results) No results for input(s): PROBNP in the last 8760 hours. HbA1C:  Recent Labs  10/03/16 0140  HGBA1C 7.0*   CBG:  Recent Labs Lab 10/03/16 0807 10/03/16 1902 10/03/16 2141 10/04/16 0632 10/04/16 1129  GLUCAP 291* 395* 375* 299* 227*   Lipid Profile:  Recent Labs  10/03/16 1419  CHOL 118  HDL 75  LDLCALC 33  TRIG 48  CHOLHDL 1.6   Thyroid Function Tests: No results for input(s): TSH, T4TOTAL, FREET4, T3FREE, THYROIDAB in the last 72 hours. Anemia Panel: No results for input(s): VITAMINB12, FOLATE, FERRITIN, TIBC, IRON, RETICCTPCT in the last 72 hours. Urine analysis:    Component Value Date/Time   COLORURINE YELLOW 10/03/2016 0144   APPEARANCEUR CLEAR 10/03/2016 0144   LABSPEC 1.029 10/03/2016 0144   PHURINE 5.0 10/03/2016 0144   GLUCOSEU >=500 (A) 10/03/2016 0144   HGBUR NEGATIVE 10/03/2016 0144   BILIRUBINUR NEGATIVE 10/03/2016 0144   KETONESUR 80 (A) 10/03/2016 0144   PROTEINUR 100 (A) 10/03/2016 0144   NITRITE NEGATIVE 10/03/2016 0144    LEUKOCYTESUR NEGATIVE 10/03/2016 0144     Barb Shear M.D. [facility] 10/04/2016, 11:33  AM  Pager: [phone] Between 7am to 7pm - call Pager - [phone]  After 7pm go to [email or web address] - password [email or web address]  Call night coverage person covering after 7pm

## 2016-10-04 NOTE — Progress Notes (Addendum)
Inpatient Diabetes Program Recommendations  AACE/ADA: New Consensus Statement on Inpatient Glycemic Control (2015)  Target Ranges:  Prepandial:   less than 140 mg/dL      Peak postprandial:   less than 180 mg/dL (1-2 hours)      Critically ill patients:  140 - 180 mg/dL   Lab Results  Component Value Date   GLUCAP 299 (H) 10/04/2016   HGBA1C 7.0 (H) 10/03/2016   Review of Glycemic Control  Diabetes history: DM 1 Outpatient Diabetes medications: Lantus 13 units, Novolog 9 units tid meal coverage Current orders for Inpatient glycemic control: Lantus 10 units, Novolog Sensitive Correction 0-9 units tid  Inpatient Diabetes Program Recommendations:    Glucose elevated at 299 mg/dl after reduced dose of home basal insulin. Please consider increasing Lantus to home dose 13 units.  Also consider increasing increasing Correction scale to Novolog Moderate Correction 0-15 units tid.  Thanks,  Christena DeemShannon Akiya Morr RN, MSN, [facility] Inpatient Diabetes Coordinator Team Pager [phone] (8a-5p)

## 2016-10-04 NOTE — Progress Notes (Signed)
Patient ID: Gordon CousinFrederick Howell, male   DOB: 10/26/1965, 51 y.o.   MRN: [id number]  [facility]     Subjective: CC- n/v Patient states that he is no longer having any abdominal pain, but he is still nauseated. Vomited again this morning. He has no other complaints, denies CP, SOB, dysuria, fever, chills, diarrhea, constipation. WBC WNL and he is afebrile. LFTs WNL today.  Objective: Vital signs in last 24 hours: Temp:  [98.4 F (36.9 C)-98.8 F (37.1 C)] 98.4 F (36.9 C) (09/13 0500) Pulse Rate:  [95-120] 98 (09/13 0500) Resp:  [16-18] 18 (09/12 2134) BP: (139-200)/(68-100) 139/68 (09/13 0500) SpO2:  [57 %-100 %] 100 % (09/13 0500) Weight:  [169 lb (76.7 kg)] 169 lb (76.7 kg) (09/12 2134) Last BM Date: 10/03/16  Intake/Output from previous day: 09/12 0701 - 09/13 0700 In: 2000 [IV Piggyback:2000] Out: 1700 [Urine:1700] Intake/Output this shift: No intake/output data recorded.  PE: Gen:  Alert, NAD, pleasant HEENT: EOM's intact, pupils equal and round Card:  RRR, no M/G/R heard Pulm:  CTAB, no W/R/R, effort normal Abd: soft, ND, nontender, +BS, no masses, hernias, or organomegaly Ext:  RLE s/p BKA. LLE s/p transmetatarsal amputation. No erythema or edema noted to BUE Psych: A&Ox3  Skin: no rashes noted, warm and dry  Lab Results:   Recent Labs  10/03/16 0140 10/04/16 0541  WBC 10.0 9.7  HGB 13.7 13.6  HCT 38.9* 38.9*  PLT 271 239   BMET  Recent Labs  10/03/16 0140 10/04/16 0541  NA 134* 136  K 3.6 4.3  CL 100* 103  CO2 24 24  GLUCOSE 281* 302*  BUN 21* 22*  CREATININE 1.19 1.24  CALCIUM 9.4 8.9   PT/INR No results for input(s): LABPROT, INR in the last 72 hours. CMP     Component Value Date/Time   NA 136 10/04/2016 0541   K 4.3 10/04/2016 0541   CL 103 10/04/2016 0541   CO2 24 10/04/2016 0541   GLUCOSE 302 (H) 10/04/2016 0541   BUN 22 (H) 10/04/2016 0541   CREATININE 1.24 10/04/2016 0541   CALCIUM 8.9 10/04/2016 0541    PROT 6.9 10/04/2016 0541   ALBUMIN 3.4 (L) 10/04/2016 0541   AST 20 10/04/2016 0541   ALT 47 10/04/2016 0541   ALKPHOS 99 10/04/2016 0541   BILITOT 1.2 10/04/2016 0541   GFRNONAA >60 10/04/2016 0541   GFRAA >60 10/04/2016 0541   Lipase     Component Value Date/Time   LIPASE 26 10/03/2016 0140       Studies/Results: Nm Hepato W/eject Fract  Result Date: 10/03/2016 CLINICAL DATA:  Right upper quadrant pain EXAM: NUCLEAR MEDICINE HEPATOBILIARY IMAGING WITH GALLBLADDER EF TECHNIQUE: Sequential images of the abdomen were obtained out to 60 minutes following intravenous administration of radiopharmaceutical. After oral ingestion of Ensure, gallbladder ejection fraction was determined. At 60 min, normal ejection fraction is greater than 33%. RADIOPHARMACEUTICALS:  5.2 mCi Tc-4040m  Choletec IV COMPARISON:  Ultrasound 10/03/2016 FINDINGS: Prompt uptake and biliary excretion of activity by the liver is seen. Gallbladder activity is visualized, but is faint. Biliary activity passes into small bowel, consistent with patent common bile duct. Calculated gallbladder ejection fraction is 52%. (Normal gallbladder ejection fraction with Ensure is greater than 33%.) IMPRESSION: Cystic duct is patent as some activity is seen within the gallbladder although activity is faint. Gallbladder ejection fraction is normal at 52%. Electronically Signed   By: Charlett NoseKevin  Dover M.D.   On: 10/03/2016 19:07   [place]  Abdomen Limited  Result Date: 10/03/2016 CLINICAL DATA:  Three-day history of right upper quadrant pain EXAM: ULTRASOUND ABDOMEN LIMITED RIGHT UPPER QUADRANT COMPARISON:  January 15, 2016 FINDINGS: Gallbladder: There is layering sludge in the gallbladder. There are no echogenic foci which move and shadow as is indicative of gallstones. There is no appreciable gallbladder wall thickening or pericholecystic fluid. No sonographic Murphy sign noted by sonographer. Common bile duct: Diameter: 2 mm. No intrahepatic or  extrahepatic biliary duct dilatation. Liver: No focal lesion identified. Within normal limits in parenchymal echogenicity. Portal vein is patent on color Doppler imaging with normal direction of blood flow towards the liver. IMPRESSION: Layering sludge in gallbladder. No gallstones evident. It should be noted that small gallstones could be obscured by this degree of sludge. No gallbladder wall thickening or pericholecystic fluid. Study otherwise unremarkable. Electronically Signed   By: Bretta Bang III M.D.   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 LFTs WNL and HIDA fairly normal. Hepatitis panel negative. Differential diagnosis: viral, gastroparesis?    LOS: 0 days    Franne Forts , [facility] 10/04/2016, 7:42 AM Pager: [phone] Consults: [phone] Mon-Fri 7:00 am-4:30 pm Sat-Sun 7:00 am-11:30 am

## 2016-10-04 NOTE — Care Management Note (Signed)
Case Management Note  Patient Details  Name: Charletta CousinFrederick Valencia MRN: [id number] Date of Birth: 1965-05-11  Subjective/Objective:   Pt in with transaminitis. He is from home with his spouse.                 Action/Plan: Plan is for him to return home when medically stable. CM following for d/c needs, physician orders.   Expected Discharge Date:                  Expected Discharge Plan:  Home/Self Care  In-House Referral:     Discharge planning Services     Post Acute Care Choice:    Choice offered to:     DME Arranged:    DME Agency:     HH Arranged:    HH Agency:     Status of Service:  In process, will continue to follow  If discussed at Long Length of Stay Meetings, dates discussed:    Additional Comments:  Kermit BaloKelli F Breely Panik, RN 10/04/2016, 2:39 PM

## 2016-10-05 ENCOUNTER — Observation Stay (HOSPITAL_COMMUNITY): Payer: BLUE CROSS/BLUE SHIELD

## 2016-10-05 DIAGNOSIS — R7989 Other specified abnormal findings of blood chemistry: Secondary | ICD-10-CM | POA: Diagnosis not present

## 2016-10-05 DIAGNOSIS — R112 Nausea with vomiting, unspecified: Secondary | ICD-10-CM | POA: Diagnosis not present

## 2016-10-05 DIAGNOSIS — R1011 Right upper quadrant pain: Secondary | ICD-10-CM | POA: Diagnosis not present

## 2016-10-05 DIAGNOSIS — K828 Other specified diseases of gallbladder: Secondary | ICD-10-CM

## 2016-10-05 LAB — GLUCOSE, CAPILLARY
Glucose-Capillary: 230 mg/dL — ABNORMAL HIGH (ref 65–99)
Glucose-Capillary: 250 mg/dL — ABNORMAL HIGH (ref 65–99)

## 2016-10-05 MED ORDER — INSULIN ASPART 100 UNIT/ML ~~LOC~~ SOLN
3.0000 [IU] | Freq: Three times a day (TID) | SUBCUTANEOUS | Status: DC
  Administered 2016-10-05: 3 [IU] via SUBCUTANEOUS

## 2016-10-05 MED ORDER — TECHNETIUM TC 99M SULFUR COLLOID
2.0000 | Freq: Once | INTRAVENOUS | Status: AC | PRN
  Administered 2016-10-05: 2 via INTRAVENOUS

## 2016-10-05 MED ORDER — AMLODIPINE BESYLATE 5 MG PO TABS: 5.0000 mg | ORAL_TABLET | Freq: Every day | ORAL | 3 refills | Status: DC

## 2016-10-05 MED ORDER — PROMETHAZINE HCL 25 MG PO TABS: 25.0000 mg | ORAL_TABLET | Freq: Four times a day (QID) | ORAL | 0 refills | Status: DC | PRN

## 2016-10-05 MED ORDER — INSULIN GLARGINE 100 UNIT/ML ~~LOC~~ SOLN
16.0000 [IU] | Freq: Every day | SUBCUTANEOUS | Status: DC
  Filled 2016-10-05: qty 0.16

## 2016-10-05 MED ORDER — METOCLOPRAMIDE HCL 5 MG PO TABS: 5.0000 mg | ORAL_TABLET | Freq: Three times a day (TID) | ORAL | 0 refills | Status: DC

## 2016-10-05 MED ORDER — PANTOPRAZOLE SODIUM 40 MG PO TBEC: 40.0000 mg | DELAYED_RELEASE_TABLET | Freq: Every day | ORAL | 3 refills | Status: AC

## 2016-10-05 MED ORDER — AMLODIPINE BESYLATE 5 MG PO TABS
5.0000 mg | ORAL_TABLET | Freq: Every day | ORAL | Status: DC
  Administered 2016-10-05: 5 mg via ORAL
  Filled 2016-10-05: qty 1

## 2016-10-05 MED ORDER — HYDRALAZINE HCL 20 MG/ML IJ SOLN: 10.0000 mg | Freq: Four times a day (QID) | INTRAMUSCULAR | Status: DC | PRN

## 2016-10-05 NOTE — Progress Notes (Signed)
[facility]                                                                              Patient Demographics  Gordon Walker, is a 51 y.o. male, DOB - [birth year], ZOX:[id number]  Admit date - 10/03/2016   Admitting Physician Ozella Rocks, MD  Outpatient Primary MD for the patient is Darrow Bussing, MD  Outpatient specialists:   LOS - 0  days   Medical records reviewed and are as summarized below:    Chief Complaint  Patient presents with  . Abdominal Pain  . Emesis       Brief summary   per admit note by Dr. Margot Ables on 9/12  Gordon Walker is a 51 y.o. male with medical history significant for HTN, HLD,DM1,s/p RLE BKA due to poor vascular issues,  presenting with 2 day history of right upper quadrant pain, accompanied by nausea and vomiting. Initially, he had seen some blood tinge in his emesis, but this has resolved prior to admission. Initially, the patient had made arrangements to have an ultrasound of the abdomen as OP,  but the symptoms were severe, thus presenting to the ED . He received IV Zofran, IV antiemetics, and pain control, with improvement of his symptoms. Denied fevers, chills, night sweats, vision changes, or mucositis. Denied any respiratory complaints. Denied any chest pain or palpitations. Denied left lower extremity swelling. Denied nausea, heartburn or change in bowel habits     Assessment & Plan    Principal Problem:   RUQ pain With transaminitis - Right upper quadrant ultrasound showed sludge In gallbladder but no gallstones, no gallstones. No gallbladder wall thickening or pericholecystic fluid - HIDA scan normal EF 52%, no need of cholecystectomy -States abdominal pain has resolved, LFTs normalized. Patient may have had gallbladder stone which passed. - Still complaining of nausea since ordered gastric and getting study to rule out gastroparesis - Denies any history of heartburn or GERD - gastric emptying study today. Was  able to tolerate liquid diet yesterday without any difficulty - If GES is negative, will recommend outpatient endoscopy, placed on PPI  Active Problems:   Transaminitis - Resolved, possibly patient may have had a gallstone which he passed    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, with long-term current use of insulin (HCC) - CBGs and controlled, increase Lantus to 16 units daily at bedtime, add meal coverage 3 units with meals, continue moderate sliding scale insulin    Status post below knee amputation of right lower extremity (HCC)    Hypertension - BP elevated, add Norvasc 5 mg daily, and continue lisinopril, hydralazine as needed  Code Status: Full code  DVT Prophylaxis:  SCD's Family Communication: Discussed in detail with the patient, all imaging results, lab results explained to the patient   Disposition Plan: awaiting for GES results   Time Spent in minutes  25 minutes  Procedures:  HIDA scan  RUQ [place]  Consultants:   Surgery   Antimicrobials:      Medications  Scheduled Meds: . amLODipine  5 mg Oral Daily  . aspirin EC  81 mg Oral  Daily  . atorvastatin  20 mg Oral q1800  . insulin aspart  0-15 Units Subcutaneous TID WC  . insulin aspart  0-5 Units Subcutaneous QHS  . insulin aspart  3 Units Subcutaneous TID WC  . insulin glargine  16 Units Subcutaneous QHS  . lisinopril  10 mg Oral Daily  . nitroGLYCERIN  0.2 mg Transdermal Daily  . pantoprazole (PROTONIX) IV  40 mg Intravenous Q24H   Continuous Infusions: . sodium chloride 100 mL/hr at 10/05/16 0406   PRN Meds:.acetaminophen **OR** acetaminophen, bisacodyl, hydrALAZINE, HYDROcodone-acetaminophen, ondansetron **OR** ondansetron (ZOFRAN) IV, promethazine, senna-docusate   Antibiotics   Anti-infectives    None        Subjective:   Gordon Walker was seen and examined today. Slight nausea however was able to tolerate liquid diet without any difficulty. No  abdominal pain. No fevers or chills. Patient denies dizziness, chest pain, shortness of breath, new weakness, numbess, tingling. No acute events overnight.    Objective:   Vitals:   10/05/16 0052 10/05/16 0407 10/05/16 0517 10/05/16 0944  BP: (!) 163/75 (!) 165/80 (!) 167/93 (!) 166/99  Pulse: (!) 109 96 98 (!) 102  Resp: Temp: 99.1 F (37.3 C) 98.8 F (37.1 C) 99 F (37.2 C) 98.6 F (37 C)  TempSrc: Oral Oral  Oral  SpO2: 99% 99% 98% 100%  Weight:      Height:        Intake/Output Summary (Last 24 hours) at 10/05/16 1259 Last data filed at 10/05/16 0940  Gross per 24 hour  Intake          3348.33 ml  Output             1030 ml  Net          2318.33 ml     Wt Readings from Last 3 Encounters:  10/03/16 76.7 kg (169 lb)  08/15/16 78.5 kg (173 lb)  07/06/16 78.5 kg (173 lb)     Exam   General: Alert and oriented x 3, NAD  Eyes:   HEENT:  Atraumatic, normocephalic  Cardiovascular: S1 S2 auscultated, no rubs, murmurs or gallops. Regular rate and rhythm. No pedal edema b/l  Respiratory: Clear to auscultation bilaterally, no wheezing, rales or rhonchi  Gastrointestinal: Soft, nontender, nondistended, + bowel sounds  Ext: no pedal edema, RLE BKA  Neuro: no new deficits   Musculoskeletal: No digital cyanosis, clubbing  Skin: No rashes  Psych: Normal affect and demeanor, alert and oriented x3     Data Reviewed:  I have personally reviewed following labs and imaging studies  Micro Results No results found for this or any previous visit (from the past 240 hour(s)).  Radiology Reports X-ray Chest Pa And Lateral  Result Date: 10/04/2016 CLINICAL DATA:  Chest pain. EXAM: CHEST  2 VIEW COMPARISON:  January 14, 2016. FINDINGS: The cardiomediastinal silhouette is normal in size. Normal pulmonary vascularity. No focal consolidation, pleural effusion, or pneumothorax. No acute osseous abnormality. IMPRESSION: No active cardiopulmonary disease.  Electronically Signed   By: Obie Dredge M.D.   On: 10/04/2016 08:04   Nm Hepato W/eject Fract  Result Date: 10/03/2016 CLINICAL DATA:  Right upper quadrant pain EXAM: NUCLEAR MEDICINE HEPATOBILIARY IMAGING WITH GALLBLADDER EF TECHNIQUE: Sequential images of the abdomen were obtained out to 60 minutes following intravenous administration of radiopharmaceutical. After oral ingestion of Ensure, gallbladder ejection fraction was determined. At 60 min, normal ejection fraction is greater than 33%. RADIOPHARMACEUTICALS:  5.2 mCi Tc-22m  Choletec IV COMPARISON:  Ultrasound 10/03/2016 FINDINGS: Prompt uptake and biliary excretion of activity by the liver is seen. Gallbladder activity is visualized, but is faint. Biliary activity passes into small bowel, consistent with patent common bile duct. Calculated gallbladder ejection fraction is 52%. (Normal gallbladder ejection fraction with Ensure is greater than 33%.) IMPRESSION: Cystic duct is patent as some activity is seen within the gallbladder although activity is faint. Gallbladder ejection fraction is normal at 52%. Electronically Signed   By: Charlett Nose M.D.   On: 10/03/2016 19:07   US Abdomen Limited  Result Date: 10/03/2016 CLINICAL DATA:  Three-day history of right upper quadrant pain EXAM: ULTRASOUND ABDOMEN LIMITED RIGHT UPPER QUADRANT COMPARISON:  January 15, 2016 FINDINGS: Gallbladder: There is layering sludge in the gallbladder. There are no echogenic foci which move and shadow as is indicative of gallstones. There is no appreciable gallbladder wall thickening or pericholecystic fluid. No sonographic Murphy sign noted by sonographer. Common bile duct: Diameter: 2 mm. No intrahepatic or extrahepatic biliary duct dilatation. Liver: No focal lesion identified. Within normal limits in parenchymal echogenicity. Portal vein is patent on color Doppler imaging with normal direction of blood flow towards the liver. IMPRESSION: Layering sludge in gallbladder.  No gallstones evident. It should be noted that small gallstones could be obscured by this degree of sludge. No gallbladder wall thickening or pericholecystic fluid. Study otherwise unremarkable. Electronically Signed   By: Bretta Bang III M.D.   On: 10/03/2016 09:43    Lab Data:  CBC:  Recent Labs Lab 10/03/16 0140 10/04/16 0541  WBC 10.0 9.7  HGB 13.7 13.6  HCT 38.9* 38.9*  MCV 82.6 84.7  PLT 271 239   Basic Metabolic Panel:  Recent Labs Lab 10/03/16 0140 10/04/16 0541  NA 134* 136  K 3.6 4.3  CL 100* 103  CO2 24 24  GLUCOSE 281* 302*  BUN 21* 22*  CREATININE 1.19 1.24  CALCIUM 9.4 8.9   GFR: Estimated Creatinine Clearance: 78.2 mL/min (by C-G formula based on SCr of 1.24 mg/dL). Liver Function Tests:  Recent Labs Lab 10/03/16 0140 10/04/16 0541  AST 47*  42* 20  ALT 69*  69* 47  ALKPHOS 99  99 99  BILITOT 1.8*  1.7* 1.2  PROT 7.4  7.5 6.9  ALBUMIN 3.9  4.0 3.4*    Recent Labs Lab 10/03/16 0140  LIPASE 26   No results for input(s): AMMONIA in the last 168 hours. Coagulation Profile: No results for input(s): INR, PROTIME in the last 168 hours. Cardiac Enzymes: No results for input(s): CKTOTAL, CKMB, CKMBINDEX, TROPONINI in the last 168 hours. BNP (last 3 results) No results for input(s): PROBNP in the last 8760 hours. HbA1C:  Recent Labs  10/03/16 0140 10/04/16 1201  HGBA1C 7.0* 7.1*   CBG:  Recent Labs Lab 10/04/16 0632 10/04/16 1129 10/04/16 1627 10/04/16 2209 10/05/16 0651  GLUCAP 299* 227* 225* 239* 230*   Lipid Profile:  Recent Labs  10/03/16 1419  CHOL 118  HDL 75  LDLCALC 33  TRIG 48  CHOLHDL 1.6   Thyroid Function Tests: No results for input(s): TSH, T4TOTAL, FREET4, T3FREE, THYROIDAB in the last 72 hours. Anemia Panel: No results for input(s): VITAMINB12, FOLATE, FERRITIN, TIBC, IRON, RETICCTPCT in the last 72 hours. Urine analysis:    Component Value Date/Time   COLORURINE YELLOW 10/04/2016 1315    APPEARANCEUR CLEAR 10/04/2016 1315   LABSPEC 1.023 10/04/2016 1315   PHURINE 5.0 10/04/2016 1315   GLUCOSEU >=500 (A) 10/04/2016 1315  HGBUR NEGATIVE 10/04/2016 1315   BILIRUBINUR NEGATIVE 10/04/2016 1315   KETONESUR 20 (A) 10/04/2016 1315   PROTEINUR 30 (A) 10/04/2016 1315   NITRITE NEGATIVE 10/04/2016 1315   LEUKOCYTESUR NEGATIVE 10/04/2016 1315     Emelee Rodocker M.D. [facility] 10/05/2016, 12:59 PM  Pager: [phone] Between 7am to 7pm - call Pager - [phone]  After 7pm go to [email or web address] - password [email or web address]  Call night coverage person covering after 7pm

## 2016-10-05 NOTE — Progress Notes (Signed)
Patient discharge home. Discharge instructions were reviewed with patient and wife. Patient and wife verbalized understanding.

## 2016-10-05 NOTE — Discharge Summary (Signed)
Physician Discharge Summary   Patient ID: Gordon Walker MRN: [id number] DOB/AGE: November 30, 1965 51 y.o.  Admit date: 10/03/2016 Discharge date: 10/05/2016  Primary Care Physician:  Darrow Bussing, MD  Discharge Diagnoses:      Gastroparesis  . Transaminitis . 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, with long-term current use of insulin (HCC) . Hyperbilirubinemia   Consults: gen surgery   Recommendations for Outpatient Follow-up:  1. Patient is started on protonix daily and reglan  TID AC 2. If symptoms donot improve in next 2-3 weeks, recommend GI referral and outatient endoscopy   3. Please repeat CBC/BMET at next visit    DIET: carb modified diet     Allergies:  No Known Allergies   DISCHARGE MEDICATIONS: Current Discharge Medication List    START taking these medications   Details  amLODipine (NORVASC) 5 MG tablet Take 1 tablet (5 mg total) by mouth daily. Qty: 30 tablet, Refills: 3    metoCLOPramide (REGLAN) 5 MG tablet Take 1 tablet (5 mg total) by mouth 3 (three) times daily before meals. Qty: 90 tablet, Refills: 0    pantoprazole (PROTONIX) 40 MG tablet Take 1 tablet (40 mg total) by mouth daily. Qty: 30 tablet, Refills: 3    promethazine (PHENERGAN) 25 MG tablet Take 1 tablet (25 mg total) by mouth every 6 (six) hours as needed for nausea or vomiting. Qty: 30 tablet, Refills: 0      CONTINUE these medications which have NOT CHANGED   Details  aspirin EC 81 MG tablet Take 81 mg by mouth daily.    atorvastatin (LIPITOR) 20 MG tablet Take 1 tablet (20 mg total) by mouth daily. Qty: 30 tablet, Refills: 0    hydroxypropyl methylcellulose / hypromellose (ISOPTO TEARS / GONIOVISC) 2.5 % ophthalmic solution Place 1 drop into both eyes 4 (four) times daily as needed for dry eyes.    insulin aspart (NOVOLOG FLEXPEN) 100 UNIT/ML FlexPen 3 times a day (just before each meal), 7-7-9 units, and pen needles 3/day Qty: 15 mL,  Refills: 11    insulin glargine (LANTUS) 100 UNIT/ML injection Inject 0.13 mLs (13 Units total) into the skin at bedtime. Qty: 10 mL, Refills: 11    lisinopril (PRINIVIL,ZESTRIL) 10 MG tablet Take 1 tablet (10 mg total) by mouth daily. Qty: 30 tablet, Refills: 0    Multiple Vitamin (MULTIVITAMIN WITH MINERALS) TABS tablet Take 1 tablet by mouth daily.    nitroGLYCERIN (NITRODUR - DOSED IN MG/24 HR) 0.2 mg/hr patch APPLY 1 PATCH TOPICALLY ONCE DAILY Qty: 30 patch, Refills: 2      STOP taking these medications     ferrous sulfate 325 (65 FE) MG tablet      methocarbamol (ROBAXIN) 500 MG tablet      oxyCODONE-acetaminophen (PERCOCET) 7.5-325 MG tablet          Brief H and P: For complete details please refer to admission H and P, but in brief per admit note by Dr. Margot Ables on 9/12  Gordon Walker a 51 y.o.malewith medical history significant for HTN, HLD,DM1,s/p RLE BKA due to poor vascular issues, presenting with 2 day history of right upper quadrant pain, accompanied by nausea and vomiting. Initially, he had seen some blood tinge in his emesis, but this has resolved prior to admission. Initially, the patient had made arrangements to have an ultrasound of the abdomen as OP, but the symptoms were severe, thus presenting to the ED . He received IV Zofran, IV antiemetics, and pain control,  with improvement of his symptoms. Denied fevers, chills, night sweats, vision changes, or mucositis. Denied any respiratory complaints. Denied any chest pain or palpitations. Denied left lower extremity swelling. Denied nausea, heartburn or change in bowel habits   Hospital Course:     RUQ pain With transaminitis, nausea likely due to gastroparesis  - Right upper quadrant ultrasound showed sludge In gallbladder but no gallstones, no gallstones. No gallbladder wall thickening or pericholecystic fluid - HIDA scan normal EF 52%, no need of cholecystectomy -States abdominal pain has resolved,  LFTs normalized. Patient may have had gallbladder stone which passed. - Denies any history of heartburn or GERD - gastric emptying study was mildly delayed showed 60%% retention at 60 min and 41%% retention at 120 min (normal retention less than 30% at a 120 min). - patient was started on reglan  TID before meals and PPI - Patient was recommended to follow-up with PCP in 2 weeks, if symptoms are not improved, recommended GI referral for EGD.   - tolerating solid diet      Transaminitis - Resolved, possibly patient may have had a gallstone which he passed    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, with long-term current use of insulin (HCC) - Hb A1c 7.1 - continue insulin regimen per outpatient dose, adjust at follow-up.     Status post below knee amputation of right lower extremity (HCC)    Hypertension - BP elevated, add Norvasc 5 mg daily, and continue lisinopril. Please adjust antihypertensives on follow-up   Day of Discharge BP (!) 192/100 (BP Location: Left Arm) Comment: RN aware  Pulse 98   Temp 98.5 F (36.9 C) (Oral)   Resp 20   Ht 6' (1.829 m)   Wt 76.7 kg (169 lb)   SpO2 99%   BMI 22.92 kg/m   Physical Exam: General: Alert and awake oriented x3 not in any acute distress. HEENT: anicteric sclera, pupils reactive to light and accommodation CVS: S1-S2 clear no murmur rubs or gallops Chest: clear to auscultation bilaterally, no wheezing rales or rhonchi Abdomen: soft nontender, nondistended, normal bowel sounds Extremities: right BKA Neuro: Cranial nerves II-XII intact, no focal neurological deficits   The results of significant diagnostics from this hospitalization (including imaging, microbiology, ancillary and laboratory) are listed below for reference.    LAB RESULTS: Basic Metabolic Panel:  Recent Labs Lab 10/03/16 0140 10/04/16 0541  NA 134* 136  K 3.6 4.3  CL 100* 103  CO2 24 24  GLUCOSE 281* 302*  BUN 21*  22*  CREATININE 1.19 1.24  CALCIUM 9.4 8.9   Liver Function Tests:  Recent Labs Lab 10/03/16 0140 10/04/16 0541  AST 47*  42* 20  ALT 69*  69* 47  ALKPHOS 99  99 99  BILITOT 1.8*  1.7* 1.2  PROT 7.4  7.5 6.9  ALBUMIN 3.9  4.0 3.4*    Recent Labs Lab 10/03/16 0140  LIPASE 26   No results for input(s): AMMONIA in the last 168 hours. CBC:  Recent Labs Lab 10/03/16 0140 10/04/16 0541  WBC 10.0 9.7  HGB 13.7 13.6  HCT 38.9* 38.9*  MCV 82.6 84.7  PLT 271 239   Cardiac Enzymes: No results for input(s): CKTOTAL, CKMB, CKMBINDEX, TROPONINI in the last 168 hours. BNP: Invalid input(s): POCBNP CBG:  Recent Labs Lab 10/05/16 0651 10/05/16 1726  GLUCAP 230* 250*    Significant Diagnostic Studies:  X-ray Chest Pa And Lateral  Result Date: 10/04/2016 CLINICAL DATA:  Chest pain. EXAM: CHEST  2 VIEW COMPARISON:  January 14, 2016. FINDINGS: The cardiomediastinal silhouette is normal in size. Normal pulmonary vascularity. No focal consolidation, pleural effusion, or pneumothorax. No acute osseous abnormality. IMPRESSION: No active cardiopulmonary disease. Electronically Signed   By: Obie Dredge M.D.   On: 10/04/2016 08:04   Nm Hepato W/eject Fract  Result Date: 10/03/2016 CLINICAL DATA:  Right upper quadrant pain EXAM: NUCLEAR MEDICINE HEPATOBILIARY IMAGING WITH GALLBLADDER EF TECHNIQUE: Sequential images of the abdomen were obtained out to 60 minutes following intravenous administration of radiopharmaceutical. After oral ingestion of Ensure, gallbladder ejection fraction was determined. At 60 min, normal ejection fraction is greater than 33%. RADIOPHARMACEUTICALS:  5.2 mCi Tc-72m  Choletec IV COMPARISON:  Ultrasound 10/03/2016 FINDINGS: Prompt uptake and biliary excretion of activity by the liver is seen. Gallbladder activity is visualized, but is faint. Biliary activity passes into small bowel, consistent with patent common bile duct. Calculated gallbladder ejection  fraction is 52%. (Normal gallbladder ejection fraction with Ensure is greater than 33%.) IMPRESSION: Cystic duct is patent as some activity is seen within the gallbladder although activity is faint. Gallbladder ejection fraction is normal at 52%. Electronically Signed   By: Charlett Nose M.D.   On: 10/03/2016 19:07   US Abdomen Limited  Result Date: 10/03/2016 CLINICAL DATA:  Three-day history of right upper quadrant pain EXAM: ULTRASOUND ABDOMEN LIMITED RIGHT UPPER QUADRANT COMPARISON:  January 15, 2016 FINDINGS: Gallbladder: There is layering sludge in the gallbladder. There are no echogenic foci which move and shadow as is indicative of gallstones. There is no appreciable gallbladder wall thickening or pericholecystic fluid. No sonographic Murphy sign noted by sonographer. Common bile duct: Diameter: 2 mm. No intrahepatic or extrahepatic biliary duct dilatation. Liver: No focal lesion identified. Within normal limits in parenchymal echogenicity. Portal vein is patent on color Doppler imaging with normal direction of blood flow towards the liver. IMPRESSION: Layering sludge in gallbladder. No gallstones evident. It should be noted that small gallstones could be obscured by this degree of sludge. No gallbladder wall thickening or pericholecystic fluid. Study otherwise unremarkable. Electronically Signed   By: Bretta Bang III M.D.   On: 10/03/2016 09:43    2D ECHO:   Disposition and Follow-up: Discharge Instructions    Diet Carb Modified    Complete by:  As directed    Increase activity slowly    Complete by:  As directed        DISPOSITION: home    DISCHARGE FOLLOW-UP Follow-up Information    Koirala, Dibas, MD. Schedule an appointment as soon as possible for a visit in 2 week(s).   Specialty:  Family Medicine Contact information: [address] [place] [address] [phone]            Time spent on Discharge:   Signed:   Thad Ranger  M.D. [facility] 10/05/2016, 6:03 PM Pager: [phone]

## 2016-10-05 NOTE — Progress Notes (Signed)
Inpatient Diabetes Program Recommendations  AACE/ADA: New Consensus Statement on Inpatient Glycemic Control (2015)  Target Ranges:  Prepandial:   less than 140 mg/dL      Peak postprandial:   less than 180 mg/dL (1-2 hours)      Critically ill patients:  140 - 180 mg/dL   Lab Results  Component Value Date   GLUCAP 230 (H) 10/05/2016   HGBA1C 7.1 (H) 10/04/2016    Review of Glycemic Control  Results for Gordon Walker, Gordon Walker (MRN [id number]) as of 10/05/2016 12:49  Ref. Range 10/04/2016 06:32 10/04/2016 11:29 10/04/2016 16:27 10/04/2016 22:09 10/05/2016 06:51  Glucose-Capillary Latest Ref Range: 65 - 99 mg/dL 119 (H) 147 (H) 829 (H) 239 (H) 230 (H)   Diabetes history: DM 1  Outpatient Diabetes medications: Lantus 17 units, Novolog 9 units tid meal coverage  Current orders for Inpatient glycemic control: Lantus 13 units, Novolog Sensitive Correction 0-9 units tid  Inpatient Diabetes Program Recommendations:    Fasting blood sugar 230 mg/dl, please consider increasing Lantus to 17 units qhs.   Consider adding Novolog 5 units tid with meals (hold if patient eats less than 50%)- continue Novolog correction as ordered.   [name], RN, BA, MHA, CDE Diabetes Coordinator Inpatient Diabetes Program  [phone] (Team Pager) [phone] [facility] Office) 10/05/2016 1:01 PM

## 2016-10-05 NOTE — Progress Notes (Signed)
Subjective/Chief Complaint: nausea   Objective: Vital signs in last 24 hours: Temp:  [98.8 F (37.1 C)-99.6 F (37.6 C)] 99 F (37.2 C) (09/14 0517) Pulse Rate:  [96-109] 98 (09/14 0517) Resp:  [17-18] 18 (09/14 0517) BP: (163-197)/(69-96) 167/93 (09/14 0517) SpO2:  [97 %-100 %] 98 % (09/14 0517) Last BM Date: 10/03/16  Intake/Output from previous day: 09/13 0701 - 09/14 0700 In: 3468.3 [P.O.:120; I.V.:3348.3] Out: 980 [Urine:980] Intake/Output this shift: No intake/output data recorded.  GI: nontender RUQ   Lab Results:   Recent Labs  10/03/16 0140 10/04/16 0541  WBC 10.0 9.7  HGB 13.7 13.6  HCT 38.9* 38.9*  PLT 271 239   BMET  Recent Labs  10/03/16 0140 10/04/16 0541  NA 134* 136  K 3.6 4.3  CL 100* 103  CO2 24 24  GLUCOSE 281* 302*  BUN 21* 22*  CREATININE 1.19 1.24  CALCIUM 9.4 8.9   PT/INR No results for input(s): LABPROT, INR in the last 72 hours. ABG No results for input(s): PHART, HCO3 in the last 72 hours.  Invalid input(s): PCO2, PO2  Studies/Results: X-ray Chest Pa And Lateral  Result Date: 10/04/2016 CLINICAL DATA:  Chest pain. EXAM: CHEST  2 VIEW COMPARISON:  January 14, 2016. FINDINGS: The cardiomediastinal silhouette is normal in size. Normal pulmonary vascularity. No focal consolidation, pleural effusion, or pneumothorax. No acute osseous abnormality. IMPRESSION: No active cardiopulmonary disease. Electronically Signed   By: Obie Dredge M.D.   On: 10/04/2016 08:04   Nm Hepato W/eject Fract  Result Date: 10/03/2016 CLINICAL DATA:  Right upper quadrant pain EXAM: NUCLEAR MEDICINE HEPATOBILIARY IMAGING WITH GALLBLADDER EF TECHNIQUE: Sequential images of the abdomen were obtained out to 60 minutes following intravenous administration of radiopharmaceutical. After oral ingestion of Ensure, gallbladder ejection fraction was determined. At 60 min, normal ejection fraction is greater than 33%. RADIOPHARMACEUTICALS:  5.2 mCi Tc-75m   Choletec IV COMPARISON:  Ultrasound 10/03/2016 FINDINGS: Prompt uptake and biliary excretion of activity by the liver is seen. Gallbladder activity is visualized, but is faint. Biliary activity passes into small bowel, consistent with patent common bile duct. Calculated gallbladder ejection fraction is 52%. (Normal gallbladder ejection fraction with Ensure is greater than 33%.) IMPRESSION: Cystic duct is patent as some activity is seen within the gallbladder although activity is faint. Gallbladder ejection fraction is normal at 52%. Electronically Signed   By: Charlett Nose M.D.   On: 10/03/2016 19:07   US Abdomen Limited  Result Date: 10/03/2016 CLINICAL DATA:  Three-day history of right upper quadrant pain EXAM: ULTRASOUND ABDOMEN LIMITED RIGHT UPPER QUADRANT COMPARISON:  January 15, 2016 FINDINGS: Gallbladder: There is layering sludge in the gallbladder. There are no echogenic foci which move and shadow as is indicative of gallstones. There is no appreciable gallbladder wall thickening or pericholecystic fluid. No sonographic Murphy sign noted by sonographer. Common bile duct: Diameter: 2 mm. No intrahepatic or extrahepatic biliary duct dilatation. Liver: No focal lesion identified. Within normal limits in parenchymal echogenicity. Portal vein is patent on color Doppler imaging with normal direction of blood flow towards the liver. IMPRESSION: Layering sludge in gallbladder. No gallstones evident. It should be noted that small gallstones could be obscured by this degree of sludge. No gallbladder wall thickening or pericholecystic fluid. Study otherwise unremarkable. Electronically Signed   By: Bretta Bang III M.D.   On: 10/03/2016 09:43    Anti-infectives: Anti-infectives    None      Assessment/Plan: NAUSEA VOMITING   SLUDGE IN GB  WITHOUT EVIDENCE OF STONE  DM 2   Agree with gastric emptying study   HIDA normal   Follow for now  May benefit from EGD at some point    LOS: 0 days     Gordon Gilardi A. 10/05/2016

## 2016-10-07 ENCOUNTER — Encounter: Payer: Self-pay | Admitting: Endocrinology

## 2016-10-15 ENCOUNTER — Other Ambulatory Visit: Payer: BLUE CROSS/BLUE SHIELD

## 2016-10-26 ENCOUNTER — Encounter: Payer: Self-pay | Admitting: Endocrinology

## 2016-10-29 ENCOUNTER — Telehealth: Payer: Self-pay

## 2016-10-29 NOTE — Telephone Encounter (Signed)
Called [employer] because they were requesting 30 days of blood glucose logs to process order for CGM. However we do not have those so I was wanting to inquire if progress/OV notes would be sufficient instead?

## 2016-10-30 ENCOUNTER — Telehealth: Payer: Self-pay

## 2016-10-30 NOTE — Telephone Encounter (Signed)
Christiane Ha from [employer] called back & stated that he would reach out to patient to see if he had cbg logs to provide to insurance company. To get him approved for CGM.

## 2016-11-06 ENCOUNTER — Encounter: Payer: Self-pay | Admitting: Endocrinology

## 2016-11-06 ENCOUNTER — Ambulatory Visit (INDEPENDENT_AMBULATORY_CARE_PROVIDER_SITE_OTHER): Payer: BLUE CROSS/BLUE SHIELD | Admitting: Endocrinology

## 2016-11-06 VITALS — BP 142/76 | HR 84 | Ht 72.0 in | Wt 186.0 lb

## 2016-11-06 DIAGNOSIS — E1052 Type 1 diabetes mellitus with diabetic peripheral angiopathy with gangrene: Secondary | ICD-10-CM | POA: Diagnosis not present

## 2016-11-06 MED ORDER — INSULIN GLARGINE 100 UNIT/ML ~~LOC~~ SOLN: 14.0000 [IU] | Freq: Every day | SUBCUTANEOUS | 11 refills | Status: DC

## 2016-11-06 MED ORDER — INSULIN ASPART 100 UNIT/ML FLEXPEN: 7.0000 [IU] | PEN_INJECTOR | Freq: Three times a day (TID) | SUBCUTANEOUS | 11 refills | Status: DC

## 2016-11-06 NOTE — Patient Instructions (Addendum)
check your blood sugar 4 times a day: before the 3 meals, and at bedtime.  also check if you have symptoms of your blood sugar being too high or too low.  please keep a record of the readings and bring it to your next appointment here (or you can bring the meter itself).  You can write it on any piece of paper.  please call us sooner if your blood sugar goes below 70, or if you have a lot of readings over 200. For now, please take: Lantus, 14 units at bedtime, and:  novolog: 7-9 units 3 times a day (just before each meal), units.  You can take the 9 units for a high blood sugar (300), or for a larger than usual meal.  Please come back for a follow-up appointment in 3-4 months.

## 2016-11-06 NOTE — Progress Notes (Signed)
Subjective:    Patient ID: Gordon Walker, male    DOB: [birth year], 51 y.o.   MRN: [id number]  HPI Pt returns for f/u of diabetes mellitus: DM type: 1 Dx'ed: 1998 Complications: polyneuropathy, bilat LE amputations, renal insuff, and retinopathy.  Therapy: insulin since dx DKA: never Severe hypoglycemia: last episode was Sept, 2017.   Pancreatitis: never.   Other: he takes multiple daily injections; he declines pump rx.  Interval history:  He still has mild hypoglycemia approx once-twice per week.  This usually happens fasting.  no cbg record, but states cbg's vary from 60-200's.  It is still highest at HS. He takes lantus, 14-17 units qhs, and humalog 7-9 units 3 times a day (just before each meal).  Past Medical History:  Diagnosis Date  . Complication of anesthesia    anesthesia complication 01/18/16; pt unaware what happened   . Dehiscence of amputation stump (HCC)    right  . Diabetes mellitus without complication (HCC)    Type 1  . Heart murmur    "years ago"  . Hypertension    "years ago"  . 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, with long-term current use of insulin [facility])     Past Surgical History:  Procedure Laterality Date  . AMPUTATION Left 01/07/2015   Procedure: Left Foot 1st Ray Amputation;  Surgeon: Nadara Mustard, MD;  Location: [facility];  Service: Orthopedics;  Laterality: Left;  . AMPUTATION Left 07/15/2015   Procedure: Left Transmetatarsal Amputation;  Surgeon: Nadara Mustard, MD;  Location: [facility] OR;  Service: Orthopedics;  Laterality: Left;  . AMPUTATION Right 12/30/2015   Procedure: 5th Ray Amputation Right Foot;  Surgeon: Nadara Mustard, MD;  Location: [facility] OR;  Service: Orthopedics;  Laterality: Right;  . AMPUTATION Right 01/15/2016   Procedure: GUILLOTINE AMPUTATION  ABOVE THE ANKLE AMPUTATION;  Surgeon: Eldred Manges, MD;  Location: [facility] OR;  Service: Orthopedics;  Laterality: Right;  . AMPUTATION Right 01/18/2016   Procedure: AMPUTATION BELOW KNEE;  Surgeon: Eldred Manges, MD;  Location: [facility] OR;  Service: Orthopedics;  Laterality: Right;  . STUMP REVISION Right 03/21/2016   Procedure: Right Below Knee Amputation Stump Revision, WOUND VAC application;  Surgeon: Eldred Manges, MD;  Location: [facility] OR;  Service: Orthopedics;  Laterality: Right;    Social History   Social History  . Marital status: Married    Spouse name: N/A  . Number of children: N/A  . Years of education: N/A   Occupational History  . Not on file.   Social History Main Topics  . Smoking status: Never Smoker  . Smokeless tobacco: Never Used  . Alcohol use No  . Drug use: No  . Sexual activity: Not on file   Other Topics Concern  . Not on file   Social History Narrative  . No narrative on file    Current Outpatient Prescriptions on File Prior to Visit  Medication Sig Dispense Refill  . aspirin EC 81 MG tablet Take 81 mg by mouth daily.    Marland Kitchen atorvastatin (LIPITOR) 20 MG tablet Take 1 tablet (20 mg total) by mouth daily. 30 tablet 0  . hydroxypropyl methylcellulose / hypromellose (ISOPTO TEARS / GONIOVISC) 2.5 % ophthalmic solution Place 1 drop into both eyes 4 (four) times daily as needed for dry eyes.    Marland Kitchen lisinopril (PRINIVIL,ZESTRIL) 10 MG tablet Take 1 tablet (10 mg total) by mouth daily. 30 tablet 0  . Multiple Vitamin (MULTIVITAMIN WITH  MINERALS) TABS tablet Take 1 tablet by mouth daily.    . nitroGLYCERIN (NITRODUR - DOSED IN MG/24 HR) 0.2 mg/hr patch APPLY 1 PATCH TOPICALLY ONCE DAILY 30 patch 2  . amLODipine (NORVASC) 5 MG tablet Take 1 tablet (5 mg total) by mouth daily. (Patient not taking: Reported on 11/06/2016) 30 tablet 3  . metoCLOPramide (REGLAN) 5 MG tablet Take 1 tablet (5 mg total) by mouth 3 (three) times daily before meals. (Patient not taking: Reported on 11/06/2016) 90 tablet 0  . pantoprazole (PROTONIX) 40 MG tablet Take 1 tablet (40 mg total) by mouth daily. (Patient not taking: Reported on 11/06/2016) 30  tablet 3  . promethazine (PHENERGAN) 25 MG tablet Take 1 tablet (25 mg total) by mouth every 6 (six) hours as needed for nausea or vomiting. (Patient not taking: Reported on 11/06/2016) 30 tablet 0   No current facility-administered medications on file prior to visit.     No Known Allergies  Family History  Problem Relation Age of Onset  . Hypertension Mother   . Diabetes Mother   . Cancer Mother   . Prostate cancer Father     BP (!) 142/76   Pulse 84   Ht 6' (1.829 m)   Wt 186 lb (84.4 kg)   SpO2 97%   BMI 25.23 kg/m    Review of Systems He denies LOC.      Objective:   Physical Exam VITAL SIGNS:  See vs page GENERAL: no distress.   Pulses: left dorsalis pedis intact.   MSK:  Left foot transmetatarsal amputation, well-healed. Right BKA.  CV: 1+ left leg edema Skin: healed ulcer at the lateral aspect of the left foot.  normal color and temp on the left foot. Neuro: sensation is intact to touch on the left foot, but severely decreased from normal.    Lab Results  Component Value Date   HGBA1C 7.1 (H) 10/04/2016      Assessment & Plan:  Type 1 DM, with renal insuff: The pattern of his cbg's indicates he needs some adjustment in his therapy.   Patient Instructions  check your blood sugar 4 times a day: before the 3 meals, and at bedtime.  also check if you have symptoms of your blood sugar being too high or too low.  please keep a record of the readings and bring it to your next appointment here (or you can bring the meter itself).  You can write it on any piece of paper.  please call us sooner if your blood sugar goes below 70, or if you have a lot of readings over 200. For now, please take: Lantus, 14 units at bedtime, and:  novolog: 7-9 units 3 times a day (just before each meal), units.  You can take the 9 units for a high blood sugar (300), or for a larger than usual meal.  Please come back for a follow-up appointment in 3-4 months.

## 2016-11-14 NOTE — Telephone Encounter (Signed)
Note made in error

## 2016-11-16 ENCOUNTER — Other Ambulatory Visit: Payer: Self-pay

## 2016-11-16 MED ORDER — INSULIN ASPART 100 UNIT/ML ~~LOC~~ SOLN: 7.0000 [IU] | Freq: Three times a day (TID) | SUBCUTANEOUS | 11 refills | Status: DC

## 2016-11-16 MED ORDER — "INSULIN SYRINGE-NEEDLE U-100 31G X 5/16"" 0.3 ML MISC": 11 refills | Status: DC

## 2016-11-16 NOTE — Telephone Encounter (Signed)
Novolog FlexPen not covered script.  Patient notified and made aware.

## 2016-11-19 ENCOUNTER — Other Ambulatory Visit: Payer: Self-pay

## 2016-11-19 MED ORDER — INSULIN ASPART 100 UNIT/ML FLEXPEN: PEN_INJECTOR | SUBCUTANEOUS | 11 refills | Status: DC

## 2016-11-19 NOTE — Telephone Encounter (Signed)
Pt is calling about the vials not being covered according to his insurance the med is covered the pharmacy requested a 60 day supply but needs a 30 day supply  Pt is asking for us to call his pharmacy for further clarification on the vials he needs this today

## 2016-11-19 NOTE — Telephone Encounter (Signed)
I called patient's pharmacy & they seemed to be confused, so I called patient. When I called patient he asked if I would just call in pens instead of vials because he has never used vials & doesn't like them.

## 2016-12-24 ENCOUNTER — Other Ambulatory Visit (INDEPENDENT_AMBULATORY_CARE_PROVIDER_SITE_OTHER): Payer: Self-pay | Admitting: Family

## 2017-03-11 ENCOUNTER — Ambulatory Visit: Payer: BLUE CROSS/BLUE SHIELD | Admitting: Endocrinology

## 2017-03-11 ENCOUNTER — Encounter: Payer: Self-pay | Admitting: Endocrinology

## 2017-03-11 ENCOUNTER — Other Ambulatory Visit: Payer: Self-pay

## 2017-03-11 VITALS — BP 210/108 | HR 84 | Wt 194.8 lb

## 2017-03-11 DIAGNOSIS — E1052 Type 1 diabetes mellitus with diabetic peripheral angiopathy with gangrene: Secondary | ICD-10-CM

## 2017-03-11 LAB — POCT GLYCOSYLATED HEMOGLOBIN (HGB A1C): Hemoglobin A1C: 7.3

## 2017-03-11 MED ORDER — INSULIN GLARGINE 100 UNIT/ML ~~LOC~~ SOLN: 13.0000 [IU] | Freq: Every day | SUBCUTANEOUS | 11 refills | Status: DC

## 2017-03-11 MED ORDER — INSULIN ASPART 100 UNIT/ML FLEXPEN: 8.0000 [IU] | PEN_INJECTOR | Freq: Three times a day (TID) | SUBCUTANEOUS | 11 refills | Status: DC

## 2017-03-11 MED ORDER — INSULIN LISPRO 100 UNIT/ML (KWIKPEN): PEN_INJECTOR | SUBCUTANEOUS | 11 refills | Status: DC

## 2017-03-11 NOTE — Progress Notes (Signed)
Subjective:    Patient ID: Gordon Walker, male    DOB: 01-27-1965, 52 y.o.   MRN: [id number]  HPI Pt returns for f/u of diabetes mellitus: DM type: 1 Dx'ed: 1998 Complications: polyneuropathy, bilat LE amputations, renal insuff, and retinopathy.  Therapy: insulin since dx DKA: never Severe hypoglycemia: last episode was Sept, 2017.   Pancreatitis: never.   Other: he takes multiple daily injections; he declines pump rx.  Interval history:  He still has mild hypoglycemia approx once per week.  This usually happens fasting.  no cbg record, but states cbg's vary from 65-180.  It is still highest at HS, and lowest fasting.  Past Medical History:  Diagnosis Date  . Complication of anesthesia    anesthesia complication 01/18/16; pt unaware what happened   . Dehiscence of amputation stump (HCC)    right  . Diabetes mellitus without complication (HCC)    Type 1  . Heart murmur    "years ago"  . Hypertension    "years ago"  . 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, with long-term current use of insulin [facility](HCC)     Past Surgical History:  Procedure Laterality Date  . AMPUTATION Left 01/07/2015   Procedure: Left Foot 1st Ray Amputation;  Surgeon: Nadara MustardMarcus Duda V, MD;  [facility];  Service: Orthopedics;  Laterality: Left;  . AMPUTATION Left 07/15/2015   Procedure: Left Transmetatarsal Amputation;  Surgeon: Nadara MustardMarcus V Duda, MD;  Location: [facility] OR;  Service: Orthopedics;  Laterality: Left;  . AMPUTATION Right 12/30/2015   Procedure: 5th Ray Amputation Right Foot;  Surgeon: Nadara MustardMarcus V Duda, MD;  [facility];  Service: Orthopedics;  Laterality: Right;  . AMPUTATION Right 01/15/2016   Procedure: GUILLOTINE AMPUTATION  ABOVE THE ANKLE AMPUTATION;  Surgeon: Eldred MangesMark C Yates, MD;  Location: [facility] OR;  Service: Orthopedics;  Laterality: Right;  . AMPUTATION Right 01/18/2016   Procedure: AMPUTATION BELOW KNEE;  Surgeon: Eldred MangesMark C Yates, MD;  Location: [facility] OR;  Service:  Orthopedics;  Laterality: Right;  . STUMP REVISION Right 03/21/2016   Procedure: Right Below Knee Amputation Stump Revision, WOUND VAC application;  Surgeon: Eldred MangesMark C Yates, MD;  Location: [facility] OR;  Service: Orthopedics;  Laterality: Right;    Social History   Socioeconomic History  . Marital status: Married    Spouse name: Not on file  . Number of children: Not on file  . Years of education: Not on file  . Highest education level: Not on file  Social Needs  . Financial resource strain: Not on file  . Food insecurity - worry: Not on file  . Food insecurity - inability: Not on file  . Transportation needs - medical: Not on file  . Transportation needs - non-medical: Not on file  Occupational History  . Not on file  Tobacco Use  . Smoking status: Never Smoker  . Smokeless tobacco: Never Used  Substance and Sexual Activity  . Alcohol use: No  . Drug use: No  . Sexual activity: Not on file  Other Topics Concern  . Not on file  Social History Narrative  . Not on file    Current Outpatient Medications on File Prior to Visit  Medication Sig Dispense Refill  . amLODipine (NORVASC) 5 MG tablet Take 1 tablet (5 mg total) by mouth daily. (Patient not taking: Reported on 11/06/2016) 30 tablet 3  . aspirin EC 81 MG tablet Take 81 mg by mouth daily.    Marland Kitchen. atorvastatin (LIPITOR) 20  MG tablet Take 1 tablet (20 mg total) by mouth daily. 30 tablet 0  . hydroxypropyl methylcellulose / hypromellose (ISOPTO TEARS / GONIOVISC) 2.5 % ophthalmic solution Place 1 drop into both eyes 4 (four) times daily as needed for dry eyes.    . Insulin Syringe-Needle U-100 (B-D INS SYR ULTRAFINE .3CC/31G) 31G X 5/16" 0.3 ML MISC Use as directed 100 each 11  . lisinopril (PRINIVIL,ZESTRIL) 10 MG tablet Take 1 tablet (10 mg total) by mouth daily. 30 tablet 0  . metoCLOPramide (REGLAN) 5 MG tablet Take 1 tablet (5 mg total) by mouth 3 (three) times daily before meals. (Patient not taking: Reported on 11/06/2016) 90  tablet 0  . Multiple Vitamin (MULTIVITAMIN WITH MINERALS) TABS tablet Take 1 tablet by mouth daily.    . nitroGLYCERIN (NITRODUR - DOSED IN MG/24 HR) 0.2 mg/hr patch APPLY 1 PATCH TOPICALLY ONCE DAILY 30 patch 12  . pantoprazole (PROTONIX) 40 MG tablet Take 1 tablet (40 mg total) by mouth daily. (Patient not taking: Reported on 11/06/2016) 30 tablet 3  . promethazine (PHENERGAN) 25 MG tablet Take 1 tablet (25 mg total) by mouth every 6 (six) hours as needed for nausea or vomiting. (Patient not taking: Reported on 11/06/2016) 30 tablet 0   No current facility-administered medications on file prior to visit.     No Known Allergies  Family History  Problem Relation Age of Onset  . Hypertension Mother   . Diabetes Mother   . Cancer Mother   . Prostate cancer Father     BP (!) 210/108 (BP Location: Left Arm, Patient Position: Sitting, Cuff Size: Normal)   Pulse 84   Wt 194 lb 12.8 oz (88.4 kg)   SpO2 96%   BMI 26.42 kg/m    Review of Systems Denies LOC    Objective:   Physical Exam VITAL SIGNS:  See vs page GENERAL: no distress.   Pulses: left dorsalis pedis intact.   MSK:  Left foot transmetatarsal amputation, well-healed. Right BKA.    A1c=7.3%    Assessment & Plan:  Type 1 DM, with renal insuff: The pattern of his cbg's indicates he needs some adjustment in his therapy.   Patient Instructions  Your blood pressure is high today.  Please see your primary care provider soon, to have it rechecked.  check your blood sugar 4 times a day: before the 3 meals, and at bedtime.  also check if you have symptoms of your blood sugar being too high or too low.  please keep a record of the readings and bring it to your next appointment here (or you can bring the meter itself).  You can write it on any piece of paper.  please call us sooner if your blood sugar goes below 70, or if you have a lot of readings over 200. For now, please take: Lantus, 13 units at bedtime, and:  novolog: 8-10  units 3 times a day (just before each meal).  Please come back for a follow-up appointment in 3 months.

## 2017-03-11 NOTE — Patient Instructions (Addendum)
Your blood pressure is high today.  Please see your primary care provider soon, to have it rechecked.  check your blood sugar 4 times a day: before the 3 meals, and at bedtime.  also check if you have symptoms of your blood sugar being too high or too low.  please keep a record of the readings and bring it to your next appointment here (or you can bring the meter itself).  You can write it on any piece of paper.  please call us sooner if your blood sugar goes below 70, or if you have a lot of readings over 200. For now, please take: Lantus, 13 units at bedtime, and:  novolog: 8-10 units 3 times a day (just before each meal).  Please come back for a follow-up appointment in 3 months.

## 2017-03-19 ENCOUNTER — Telehealth: Payer: Self-pay | Admitting: Endocrinology

## 2017-03-19 MED ORDER — INSULIN REGULAR HUMAN 100 UNIT/ML IJ SOLN: INTRAMUSCULAR | 3 refills | Status: DC

## 2017-03-19 NOTE — Telephone Encounter (Signed)
See message. Could we switch to the [facility] brand?

## 2017-03-19 NOTE — Telephone Encounter (Signed)
[facility] reg, same dosage please

## 2017-03-19 NOTE — Telephone Encounter (Signed)
Pt is calling back about his Novalog pen and his insurance.  He states he spoke with the nurse earlier but wanted to follow up with her about this again   Please advise

## 2017-03-19 NOTE — Telephone Encounter (Signed)
Patient stated he went to the his pharmacy [facility], stated his insulin Novalog kwinpen was $ 500.00. Please advise on what to do  [phone]

## 2017-03-19 NOTE — Telephone Encounter (Signed)
Rx submitted per MD's request. Patient had no further questions at this time.

## 2017-03-19 NOTE — Telephone Encounter (Signed)
I contacted the patient and advised we would change the humalog to Novolin R to pick up at [facility]. Patient voiced understanding.

## 2017-03-27 ENCOUNTER — Telehealth: Payer: Self-pay | Admitting: Endocrinology

## 2017-03-27 NOTE — Telephone Encounter (Signed)
Patient stated that insulin Novalog flex pen is to expensive even with the coupon card, is there another alternative. Pleas advise

## 2017-03-27 NOTE — Telephone Encounter (Signed)
novolin R at same dosage.

## 2017-03-28 ENCOUNTER — Other Ambulatory Visit: Payer: Self-pay

## 2017-03-28 MED ORDER — INSULIN REGULAR HUMAN 100 UNIT/ML IJ SOLN: INTRAMUSCULAR | 3 refills | Status: DC

## 2017-03-28 NOTE — Telephone Encounter (Signed)
I called LVM that I was sending in prescription for Relion R insulin. It was on current med list back from 2/26 so I refilled it.

## 2017-06-04 ENCOUNTER — Telehealth (INDEPENDENT_AMBULATORY_CARE_PROVIDER_SITE_OTHER): Payer: Self-pay | Admitting: Orthopedic Surgery

## 2017-06-04 NOTE — Telephone Encounter (Signed)
Patient called to request a left sole insert, [employer] said he would have to request them from [place]. He said Gordon Walker wrote his last one in 2017. Patients # [phone]

## 2017-06-05 NOTE — Telephone Encounter (Signed)
Pt walked in office today. Per Dr. Lajoyce Corners order written for the left transmet extra depth shoes, custom orthotics, carbon plate and double up right brace if needed. This was signed and given to the pt while he was here.

## 2017-06-07 ENCOUNTER — Ambulatory Visit (INDEPENDENT_AMBULATORY_CARE_PROVIDER_SITE_OTHER): Payer: BLUE CROSS/BLUE SHIELD | Admitting: Endocrinology

## 2017-06-07 ENCOUNTER — Encounter: Payer: Self-pay | Admitting: Endocrinology

## 2017-06-07 VITALS — BP 140/90 | HR 69 | Wt 190.2 lb

## 2017-06-07 DIAGNOSIS — E1052 Type 1 diabetes mellitus with diabetic peripheral angiopathy with gangrene: Secondary | ICD-10-CM

## 2017-06-07 LAB — POCT GLYCOSYLATED HEMOGLOBIN (HGB A1C): Hemoglobin A1C: 6.9

## 2017-06-07 MED ORDER — INSULIN REGULAR HUMAN 100 UNIT/ML IJ SOLN: 9.0000 [IU] | Freq: Three times a day (TID) | INTRAMUSCULAR | 3 refills | Status: DC

## 2017-06-07 MED ORDER — INSULIN GLARGINE 100 UNIT/ML ~~LOC~~ SOLN: 10.0000 [IU] | Freq: Every day | SUBCUTANEOUS | 11 refills | Status: DC

## 2017-06-07 NOTE — Progress Notes (Signed)
Subjective:    Patient ID: Gordon Walker, male    DOB: 05-May-1965, 52 y.o.   MRN: [id number]  HPI Pt returns for f/u of diabetes mellitus: DM type: 1 Dx'ed: 1998 Complications: polyneuropathy, PAD, bilat LE amputations, renal insuff, and DR.  Therapy: insulin since dx DKA: never.   Severe hypoglycemia: last episode was 2017.   Pancreatitis: never.   Other: he takes multiple daily injections; he declines pump rx.  Interval history:  He still has mild hypoglycemia approx twice per week.  This usually happens fasting.  no cbg record, but states cbg's vary from 50-180.  It is still highest in the afternoon, and lowest fasting.  Past Medical History:  Diagnosis Date  . Complication of anesthesia    anesthesia complication 01/18/16; pt unaware what happened   . Dehiscence of amputation stump (HCC)    right  . Diabetes mellitus without complication (HCC)    Type 1  . Heart murmur    "years ago"  . Hypertension    "years ago"  . 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, with long-term current use of insulin [facility])     Past Surgical History:  Procedure Laterality Date  . AMPUTATION Left 01/07/2015   Procedure: Left Foot 1st Ray Amputation;  Surgeon: Nadara Mustard, MD;  Location: [facility] OR;  Service: Orthopedics;  Laterality: Left;  . AMPUTATION Left 07/15/2015   Procedure: Left Transmetatarsal Amputation;  Surgeon: Nadara Mustard, MD;  Location: [facility] OR;  Service: Orthopedics;  Laterality: Left;  . AMPUTATION Right 12/30/2015   Procedure: 5th Ray Amputation Right Foot;  Surgeon: Nadara Mustard, MD;  Location: [facility] OR;  Service: Orthopedics;  Laterality: Right;  . AMPUTATION Right 01/15/2016   Procedure: GUILLOTINE AMPUTATION  ABOVE THE ANKLE AMPUTATION;  Surgeon: Eldred Manges, MD;  Location: [facility] OR;  Service: Orthopedics;  Laterality: Right;  . AMPUTATION Right 01/18/2016   Procedure: AMPUTATION BELOW KNEE;  Surgeon: Eldred Manges, MD;  Location: [facility] OR;   Service: Orthopedics;  Laterality: Right;  . STUMP REVISION Right 03/21/2016   Procedure: Right Below Knee Amputation Stump Revision, WOUND VAC application;  Surgeon: Eldred Manges, MD;  Location: [facility] OR;  Service: Orthopedics;  Laterality: Right;    Social History   Socioeconomic History  . Marital status: Married    Spouse name: Not on file  . Number of children: Not on file  . Years of education: Not on file  . Highest education level: Not on file  Occupational History  . Not on file  Social Needs  . Financial resource strain: Not on file  . Food insecurity:    Worry: Not on file    Inability: Not on file  . Transportation needs:    Medical: Not on file    Non-medical: Not on file  Tobacco Use  . Smoking status: Never Smoker  . Smokeless tobacco: Never Used  Substance and Sexual Activity  . Alcohol use: No  . Drug use: No  . Sexual activity: Not on file  Lifestyle  . Physical activity:    Days per week: Not on file    Minutes per session: Not on file  . Stress: Not on file  Relationships  . Social connections:    Talks on phone: Not on file    Gets together: Not on file    Attends religious service: Not on file    Active member of club or organization: Not on file  Attends meetings of clubs or organizations: Not on file    Relationship status: Not on file  . Intimate partner violence:    Fear of current or ex partner: Not on file    Emotionally abused: Not on file    Physically abused: Not on file    Forced sexual activity: Not on file  Other Topics Concern  . Not on file  Social History Narrative  . Not on file    Current Outpatient Medications on File Prior to Visit  Medication Sig Dispense Refill  . aspirin EC 81 MG tablet Take 81 mg by mouth daily.    Marland Kitchen atorvastatin (LIPITOR) 20 MG tablet Take 1 tablet (20 mg total) by mouth daily. 30 tablet 0  . hydroxypropyl methylcellulose / hypromellose (ISOPTO TEARS / GONIOVISC) 2.5 % ophthalmic solution Place 1  drop into both eyes 4 (four) times daily as needed for dry eyes.    . Insulin Syringe-Needle U-100 (B-D INS SYR ULTRAFINE .3CC/31G) 31G X 5/16" 0.3 ML MISC Use as directed 100 each 11  . lisinopril (PRINIVIL,ZESTRIL) 10 MG tablet Take 1 tablet (10 mg total) by mouth daily. 30 tablet 0  . nitroGLYCERIN (NITRODUR - DOSED IN MG/24 HR) 0.2 mg/hr patch APPLY 1 PATCH TOPICALLY ONCE DAILY 30 patch 12  . amLODipine (NORVASC) 5 MG tablet Take 1 tablet (5 mg total) by mouth daily. (Patient not taking: Reported on 11/06/2016) 30 tablet 3  . metoCLOPramide (REGLAN) 5 MG tablet Take 1 tablet (5 mg total) by mouth 3 (three) times daily before meals. (Patient not taking: Reported on 11/06/2016) 90 tablet 0  . Multiple Vitamin (MULTIVITAMIN WITH MINERALS) TABS tablet Take 1 tablet by mouth daily.    . pantoprazole (PROTONIX) 40 MG tablet Take 1 tablet (40 mg total) by mouth daily. (Patient not taking: Reported on 11/06/2016) 30 tablet 3  . promethazine (PHENERGAN) 25 MG tablet Take 1 tablet (25 mg total) by mouth every 6 (six) hours as needed for nausea or vomiting. (Patient not taking: Reported on 11/06/2016) 30 tablet 0   No current facility-administered medications on file prior to visit.     No Known Allergies  Family History  Problem Relation Age of Onset  . Hypertension Mother   . Diabetes Mother   . Cancer Mother   . Prostate cancer Father     BP 140/90   Pulse 69   Wt 190 lb 3.2 oz (86.3 kg)   SpO2 95%   BMI 25.80 kg/m    Review of Systems Denies LOC.      Objective:   Physical Exam VITAL SIGNS:  See vs page GENERAL: no distress.   Pulses: left dorsalis pedis intact.   MSK:  Left foot transmetatarsal amputation, well-healed. Right BKA.   Lab Results  Component Value Date   HGBA1C 6.9 06/07/2017   Lab Results  Component Value Date   CREATININE 1.24 10/04/2016   BUN 22 (H) 10/04/2016   NA 136 10/04/2016   K 4.3 10/04/2016   CL 103 10/04/2016   CO2 24 10/04/2016         Assessment & Plan:  Type 1 DM, with PAD: The pattern of his cbg's indicates he needs some adjustment in his therapy Renal insuff: he needs little if any lantus Hypoglycemia: this limits aggressiveness of glycemic control.  HTN: is noted today.    Patient Instructions  Please change the insulins to the numbers below. Your blood pressure is high today.  Please see your primary care  provider soon, to have it rechecked.  check your blood sugar 4 times a day: before the 3 meals, and at bedtime.  also check if you have symptoms of your blood sugar being too high or too low.  please keep a record of the readings and bring it to your next appointment here (or you can bring the meter itself).  You can write it on any piece of paper.  please call us sooner if your blood sugar goes below 70, or if you have a lot of readings over 200. Please come back for a follow-up appointment in 4 months.

## 2017-06-07 NOTE — Patient Instructions (Signed)
Please change the insulins to the numbers below. Your blood pressure is high today.  Please see your primary care provider soon, to have it rechecked.  check your blood sugar 4 times a day: before the 3 meals, and at bedtime.  also check if you have symptoms of your blood sugar being too high or too low.  please keep a record of the readings and bring it to your next appointment here (or you can bring the meter itself).  You can write it on any piece of paper.  please call us sooner if your blood sugar goes below 70, or if you have a lot of readings over 200. Please come back for a follow-up appointment in 4 months.

## 2017-06-21 ENCOUNTER — Telehealth: Payer: Self-pay | Admitting: Endocrinology

## 2017-06-21 NOTE — Telephone Encounter (Signed)
Patient has met deductible and would like Dr Loanne Drilling to send in the [place] R insulin to his pharmacy    Dent, [place].  insulin regular (NOVOLIN R RELION) 100 units/mL injection

## 2017-06-25 MED ORDER — INSULIN REGULAR HUMAN 100 UNIT/ML IJ SOLN: 9.0000 [IU] | Freq: Three times a day (TID) | INTRAMUSCULAR | 3 refills | Status: DC

## 2017-06-25 NOTE — Addendum Note (Signed)
Addended by: Yolande JollyLAWSON, Natalin Bible on: 06/25/2017 11:51 AM   Modules accepted: Orders

## 2017-06-25 NOTE — Telephone Encounter (Signed)
Sent to pharmacy 

## 2017-08-07 ENCOUNTER — Encounter (INDEPENDENT_AMBULATORY_CARE_PROVIDER_SITE_OTHER): Payer: Self-pay | Admitting: Orthopaedic Surgery

## 2017-08-07 ENCOUNTER — Ambulatory Visit (INDEPENDENT_AMBULATORY_CARE_PROVIDER_SITE_OTHER): Payer: BLUE CROSS/BLUE SHIELD | Admitting: Orthopaedic Surgery

## 2017-08-07 VITALS — BP 136/89 | HR 68 | Ht 72.0 in | Wt 190.0 lb

## 2017-08-07 DIAGNOSIS — IMO0002 Reserved for concepts with insufficient information to code with codable children: Secondary | ICD-10-CM

## 2017-08-07 DIAGNOSIS — Z89511 Acquired absence of right leg below knee: Secondary | ICD-10-CM

## 2017-08-07 NOTE — Progress Notes (Signed)
Office Visit Note   Patient: Gordon Walker           Date of Birth: Sep 16, 1965           MRN: [id number] Visit Date: 08/07/2017              Requested by: Darrow BussingKoirala, Dibas, MD [address] [place], [place] [address] PCP: Darrow BussingKoirala, Dibas, MD   Assessment & Plan: Visit Diagnoses:  1. Below knee amputation status, right (HCC)     Plan: Patient needs a new BKA socket on the right since his amputation stump is shrunk he is now bottoming out despite extra padding and is at risk for developing osteomyelitis.  He is working full-time.  Prescription given office follow-up PRN.  Follow-Up Instructions: Return if symptoms worsen or fail to improve.   Orders:  No orders of the defined types were placed in this encounter.  No orders of the defined types were placed in this encounter.     Procedures: No procedures performed   Clinical Data: No additional findings.   Subjective: Chief Complaint  Patient presents with  . Right Leg - Follow-up    HPI 52 year old returns he is working full-time as a Midwifequality insurance specialist.  He had a amputation BKA on the right 03/21/2016.  Last A1c was under 7 which is excellent.  Tip of his stump has soft small crack from stump shrinkage he is bottoming out despite extra padding and needs to have a revision socket made due to stump shrinkage before he ends up with osteomyelitis and needs revision surgery.  Review of Systems reviewed updated unchanged from last or surgery.   Objective: Vital Signs: BP 136/89   Pulse 68   Ht 6' (1.829 m)   Wt 190 lb (86.2 kg)   BMI 25.77 kg/m   Physical Exam  Constitutional: He is oriented to person, place, and time. He appears well-developed and well-nourished.  HENT:  Head: Normocephalic and atraumatic.  Eyes: Pupils are equal, round, and reactive to light. EOM are normal.  Neck: No tracheal deviation present. No thyromegaly present.  Cardiovascular: Normal rate.  Pulmonary/Chest:  Effort normal. He has no wheezes.  Abdominal: Soft. Bowel sounds are normal.  Neurological: He is alert and oriented to person, place, and time.  Skin: Skin is warm and dry. Capillary refill takes less than 2 seconds.  Psychiatric: He has a normal mood and affect. His behavior is normal. Judgment and thought content normal.    Ortho Exam patient has a 8 mm x 6 mm 3 prong split the tip of his stump without visualized suture and no visualized tibia.  No cellulitis.  There is some erythema where he has been bumping the inferior pole the patella.  No problems with the distal fibula. Specialty Comments:  No specialty comments available.  Imaging: No results found.   PMFS History: Patient Active Problem List   Diagnosis Date Noted  . Hypertension 10/03/2016  . RUQ pain 10/03/2016  . Elevated liver function tests   . Intractable vomiting with nausea   . Sludge in gallbladder   . Wound dehiscence, surgical   . Acute blood loss anemia 01/20/2016  . Status post below knee amputation of right lower extremity (HCC) 01/20/2016  . Hyponatremia 01/17/2016  . AKI (acute kidney injury) (HCC) 01/17/2016  . Hyperbilirubinemia 01/17/2016  . Bacteremia due to Escherichia coli 01/17/2016  . Sepsis (HCC) 01/17/2016  . DKA (diabetic ketoacidoses) (HCC) 01/15/2016  . Controlled diabetes mellitus with  diabetic peripheral angiopathy and gangrene (HCC)   . 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 Transaminitis 01/07/2015  . Osteomyelitis of left foot (HCC) 01/06/2015  . Uncontrolled diabetes mellitus with foot ulcer, with long-term current use of insulin (HCC) 01/06/2015  . Anemia 01/06/2015  . Osteomyelitis (HCC) 01/05/2015   Past Medical History:  Diagnosis Date  . Complication of anesthesia    anesthesia complication 01/18/16; pt unaware what happened   . Dehiscence of amputation stump (HCC)    right  . Diabetes  mellitus without complication (HCC)    Type 1  . Heart murmur    "years ago"  . Hypertension    "years ago"  . 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, with long-term current use of insulin (HCC)     Family History  Problem Relation Age of Onset  . Hypertension Mother   . Diabetes Mother   . Cancer Mother   . Prostate cancer Father     Past Surgical History:  Procedure Laterality Date  . AMPUTATION Left 01/07/2015   Procedure: Left Foot 1st Ray Amputation;  Surgeon: Nadara Mustard, MD;  Location: [facility] OR;  Service: Orthopedics;  Laterality: Left;  . AMPUTATION Left 07/15/2015   Procedure: Left Transmetatarsal Amputation;  Surgeon: Nadara Mustard, MD;  Location: [facility] OR;  Service: Orthopedics;  Laterality: Left;  . AMPUTATION Right 12/30/2015   Procedure: 5th Ray Amputation Right Foot;  Surgeon: Nadara Mustard, MD;  Location: [facility] OR;  Service: Orthopedics;  Laterality: Right;  . AMPUTATION Right 01/15/2016   Procedure: GUILLOTINE AMPUTATION  ABOVE THE ANKLE AMPUTATION;  Surgeon: Eldred Manges, MD;  Location: [facility] OR;  Service: Orthopedics;  Laterality: Right;  . AMPUTATION Right 01/18/2016   Procedure: AMPUTATION BELOW KNEE;  Surgeon: Eldred Manges, MD;  Location: [facility] OR;  Service: Orthopedics;  Laterality: Right;  . STUMP REVISION Right 03/21/2016   Procedure: Right Below Knee Amputation Stump Revision, WOUND VAC application;  Surgeon: Eldred Manges, MD;  Location: [facility] OR;  Service: Orthopedics;  Laterality: Right;   Social History   Occupational History  . Not on file  Tobacco Use  . Smoking status: Never Smoker  . Smokeless tobacco: Never Used  Substance and Sexual Activity  . Alcohol use: No  . Drug use: No  . Sexual activity: Not on file

## 2017-08-12 ENCOUNTER — Telehealth (INDEPENDENT_AMBULATORY_CARE_PROVIDER_SITE_OTHER): Payer: Self-pay | Admitting: Orthopaedic Surgery

## 2017-08-12 NOTE — Telephone Encounter (Signed)
Marcelino DusterMichelle from [facility] called advised she faxed over a form to be completed and faxed back to her 08/09/17. The number to contact Marcelino DusterMichelle is [phone]

## 2017-08-13 NOTE — Telephone Encounter (Signed)
Form faxed back to [facility].

## 2017-08-13 NOTE — Telephone Encounter (Signed)
Have you seen this form?

## 2017-10-08 ENCOUNTER — Encounter: Payer: Self-pay | Admitting: Endocrinology

## 2017-10-08 ENCOUNTER — Ambulatory Visit (INDEPENDENT_AMBULATORY_CARE_PROVIDER_SITE_OTHER): Payer: BLUE CROSS/BLUE SHIELD | Admitting: Endocrinology

## 2017-10-08 VITALS — BP 136/80 | HR 81 | Ht 72.0 in | Wt 185.8 lb

## 2017-10-08 DIAGNOSIS — E1052 Type 1 diabetes mellitus with diabetic peripheral angiopathy with gangrene: Secondary | ICD-10-CM

## 2017-10-08 LAB — POCT GLYCOSYLATED HEMOGLOBIN (HGB A1C): HEMOGLOBIN A1C: 7 % — AB (ref 4.0–5.6)

## 2017-10-08 MED ORDER — INSULIN GLARGINE 100 UNIT/ML ~~LOC~~ SOLN: 9.0000 [IU] | Freq: Every day | SUBCUTANEOUS | 11 refills | Status: DC

## 2017-10-08 NOTE — Progress Notes (Signed)
Subjective:    Patient ID: Gordon Walker, male    DOB: [birth year], 52 y.o.   MRN: [id number]  HPI Pt returns for f/u of diabetes mellitus: DM type: 1 Dx'ed: 1998 Complications: polyneuropathy, PAD, bilat LE amputations, renal insuff, and DR.  Therapy: insulin since dx DKA: never.   Severe hypoglycemia: last episode was 2017.   Pancreatitis: never.   Other: he takes multiple daily injections; he declines pump rx.  Interval history:  He still has mild hypoglycemia approx 2-3 times per week.  This still usually happens fasting.  no cbg record, but states cbg's vary from 68-225.  It is still highest in the afternoon, and lowest fasting.  Past Medical History:  Diagnosis Date  . Complication of anesthesia    anesthesia complication 01/18/16; pt unaware what happened   . Dehiscence of amputation stump (HCC)    right  . Diabetes mellitus without complication (HCC)    Type 1  . Heart murmur    "years ago"  . Hypertension    "years ago"  . 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, with long-term current use of insulin [facility])     Past Surgical History:  Procedure Laterality Date  . AMPUTATION Left 01/07/2015   Procedure: Left Foot 1st Ray Amputation;  Surgeon: Nadara Mustard, MD;  Location: [facility] OR;  Service: Orthopedics;  Laterality: Left;  . AMPUTATION Left 07/15/2015   Procedure: Left Transmetatarsal Amputation;  Surgeon: Nadara Mustard, MD;  Location: [facility] OR;  Service: Orthopedics;  Laterality: Left;  . AMPUTATION Right 12/30/2015   Procedure: 5th Ray Amputation Right Foot;  Surgeon: Nadara Mustard, MD;  Location[facility] OR;  Service: Orthopedics;  Laterality: Right;  . AMPUTATION Right 01/15/2016   Procedure: GUILLOTINE AMPUTATION  ABOVE THE ANKLE AMPUTATION;  Surgeon: Eldred Manges, MD;  Location: [facility] OR;  Service: Orthopedics;  Laterality: Right;  . AMPUTATION Right 01/18/2016   Procedure: AMPUTATION BELOW KNEE;  Surgeon: Eldred Manges, MD;  Location: [facility]  OR;  Service: Orthopedics;  Laterality: Right;  . STUMP REVISION Right 03/21/2016   Procedure: Right Below Knee Amputation Stump Revision, WOUND VAC application;  Surgeon: Eldred Manges, MD;  Location: [facility] OR;  Service: Orthopedics;  Laterality: Right;    Social History   Socioeconomic History  . Marital status: Married    Spouse name: Not on file  . Number of children: Not on file  . Years of education: Not on file  . Highest education level: Not on file  Occupational History  . Not on file  Social Needs  . Financial resource strain: Not on file  . Food insecurity:    Worry: Not on file    Inability: Not on file  . Transportation needs:    Medical: Not on file    Non-medical: Not on file  Tobacco Use  . Smoking status: Never Smoker  . Smokeless tobacco: Never Used  Substance and Sexual Activity  . Alcohol use: No  . Drug use: No  . Sexual activity: Not on file  Lifestyle  . Physical activity:    Days per week: Not on file    Minutes per session: Not on file  . Stress: Not on file  Relationships  . Social connections:    Talks on phone: Not on file    Gets together: Not on file    Attends religious service: Not on file    Active member of club or organization: Not on file  Attends meetings of clubs or organizations: Not on file    Relationship status: Not on file  . Intimate partner violence:    Fear of current or ex partner: Not on file    Emotionally abused: Not on file    Physically abused: Not on file    Forced sexual activity: Not on file  Other Topics Concern  . Not on file  Social History Narrative  . Not on file    Current Outpatient Medications on File Prior to Visit  Medication Sig Dispense Refill  . aspirin EC 81 MG tablet Take 81 mg by mouth daily.    . insulin regular (NOVOLIN R RELION) 100 units/mL injection Inject 0.09-0.11 mLs (9-11 Units total) into the skin 3 (three) times daily before meals. 10 mL 3  . Insulin Syringe-Needle U-100 (B-D INS  SYR ULTRAFINE .3CC/31G) 31G X 5/16" 0.3 ML MISC Use as directed 100 each 11  . lisinopril (PRINIVIL,ZESTRIL) 10 MG tablet Take 1 tablet (10 mg total) by mouth daily. 30 tablet 0  . Multiple Vitamin (MULTIVITAMIN WITH MINERALS) TABS tablet Take 1 tablet by mouth daily.    . nitroGLYCERIN (NITRODUR - DOSED IN MG/24 HR) 0.2 mg/hr patch APPLY 1 PATCH TOPICALLY ONCE DAILY 30 patch 12  . pantoprazole (PROTONIX) 40 MG tablet Take 1 tablet (40 mg total) by mouth daily. 30 tablet 3  . promethazine (PHENERGAN) 25 MG tablet Take 1 tablet (25 mg total) by mouth every 6 (six) hours as needed for nausea or vomiting. 30 tablet 0   No current facility-administered medications on file prior to visit.     No Known Allergies  Family History  Problem Relation Age of Onset  . Hypertension Mother   . Diabetes Mother   . Cancer Mother   . Prostate cancer Father     BP 136/80 (BP Location: Left Arm)   Pulse 81   Ht 6' (1.829 m)   Wt 185 lb 12.8 oz (84.3 kg)   SpO2 98%   BMI 25.20 kg/m    Review of Systems Denies LOC    Objective:   Physical Exam VITAL SIGNS:  See vs page GENERAL: no distress.   Pulses: left dorsalis pedis intact.   MSK:  Left foot transmetatarsal amputation, well-healed.  Ext: Right BKA.  Neuro: sensation is intact to touch on the left foot   Lab Results  Component Value Date   HGBA1C 7.0 (A) 10/08/2017   Lab Results  Component Value Date   CREATININE 1.24 10/04/2016   BUN 22 (H) 10/04/2016   NA 136 10/04/2016   K 4.3 10/04/2016   CL 103 10/04/2016   CO2 24 10/04/2016      Assessment & Plan:  Type 1 DM, with PAD, on multiple daily injections. Hypoglycemia: this limits aggressiveness of glycemic control Renal insuff: in this setting, the basal insulin is chosen for reduction  Patient Instructions  Please reduce the lantus to 9 units at bedtime.   check your blood sugar 4 times a day: before the 3 meals, and at bedtime.  also check if you have symptoms of your  blood sugar being too high or too low.  please keep a record of the readings and bring it to your next appointment here (or you can bring the meter itself).  You can write it on any piece of paper.  please call us sooner if your blood sugar goes below 70, or if you have a lot of readings over 200. Please come back  for a follow-up appointment in 4 months.

## 2017-10-08 NOTE — Patient Instructions (Addendum)
Please reduce the lantus to 9 units at bedtime.   check your blood sugar 4 times a day: before the 3 meals, and at bedtime.  also check if you have symptoms of your blood sugar being too high or too low.  please keep a record of the readings and bring it to your next appointment here (or you can bring the meter itself).  You can write it on any piece of paper.  please call us sooner if your blood sugar goes below 70, or if you have a lot of readings over 200. Please come back for a follow-up appointment in 4 months.

## 2018-01-07 ENCOUNTER — Encounter: Payer: Self-pay | Admitting: Endocrinology

## 2018-01-07 ENCOUNTER — Ambulatory Visit: Payer: BLUE CROSS/BLUE SHIELD | Admitting: Endocrinology

## 2018-01-07 ENCOUNTER — Ambulatory Visit (INDEPENDENT_AMBULATORY_CARE_PROVIDER_SITE_OTHER): Payer: BLUE CROSS/BLUE SHIELD | Admitting: Endocrinology

## 2018-01-07 VITALS — BP 178/88 | HR 88 | Ht 72.0 in | Wt 192.6 lb

## 2018-01-07 DIAGNOSIS — E1052 Type 1 diabetes mellitus with diabetic peripheral angiopathy with gangrene: Secondary | ICD-10-CM

## 2018-01-07 LAB — POCT GLYCOSYLATED HEMOGLOBIN (HGB A1C): HEMOGLOBIN A1C: 6.4 % — AB (ref 4.0–5.6)

## 2018-01-07 MED ORDER — INSULIN GLARGINE 100 UNIT/ML ~~LOC~~ SOLN: 8.0000 [IU] | Freq: Every day | SUBCUTANEOUS | 11 refills | Status: DC

## 2018-01-07 NOTE — Patient Instructions (Addendum)
Your blood pressure is high today.  Please see your primary care provider soon, to have it rechecked Please reduce the lantus to 8 units at bedtime.   check your blood sugar 4 times a day: before the 3 meals, and at bedtime.  also check if you have symptoms of your blood sugar being too high or too low.  please keep a record of the readings and bring it to your next appointment here (or you can bring the meter itself).  You can write it on any piece of paper.  please call us sooner if your blood sugar goes below 70, or if you have a lot of readings over 200. Please come back for a follow-up appointment in 4 months.

## 2018-01-07 NOTE — Progress Notes (Signed)
Subjective:    Patient ID: Gordon CousinFrederick Malkiewicz, male    DOB: [birth year], 52 y.o.   MRN: [id number]  HPI Pt returns for f/u of diabetes mellitus:  DM type: 1 Dx'ed: 1998 Complications: polyneuropathy, PAD, bilat LE amputations, renal insuff, and DR.  Therapy: insulin since dx.  DKA: never.   Severe hypoglycemia: last episode was 2017.   Pancreatitis: never.   Other: he takes multiple daily injections; he declines pump rx.  Interval history:  He still has mild hypoglycemia approx 1-2 times per week.  This usually happens fasting.  no cbg record, but states cbg's vary from 70-171.  It is still highest in the afternoon, and lowest fasting.   Past Medical History:  Diagnosis Date  . Complication of anesthesia    anesthesia complication 01/18/16; pt unaware what happened   . Dehiscence of amputation stump (HCC)    right  . Diabetes mellitus without complication (HCC)    Type 1  . Heart murmur    "years ago"  . Hypertension    "years ago"  . 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 AMPUTATION Left 01/07/2015   Procedure: Left Foot 1st Ray Amputation;  Surgeon: Nadara MustardMarcus Duda V, MD;  [facility];  Service: Orthopedics;  Laterality: Left;  . AMPUTATION Left 07/15/2015   Procedure: Left Transmetatarsal Amputation;  Surgeon: Nadara MustardMarcus V Duda, MD;  Location: [facility] OR;  Service: Orthopedics;  Laterality: Left;  . AMPUTATION Right 12/30/2015   Procedure: 5th Ray Amputation Right Foot;  Surgeon: Nadara MustardMarcus V Duda, MD;  [facility];  Service: Orthopedics;  Laterality: Right;  . AMPUTATION Right 01/15/2016   Procedure: GUILLOTINE AMPUTATION  ABOVE THE ANKLE AMPUTATION;  Surgeon: Eldred MangesMark C Yates, MD;  Location: [facility] OR;  Service: Orthopedics;  Laterality: Right;  . AMPUTATION Right 01/18/2016   Procedure: AMPUTATION BELOW KNEE;  Surgeon: Eldred MangesMark C Yates, MD;  Location: [facility] OR;   Service: Orthopedics;  Laterality: Right;  . STUMP REVISION Right 03/21/2016   Procedure: Right Below Knee Amputation Stump Revision, WOUND VAC application;  Surgeon: Eldred MangesMark C Yates, MD;  Location: [facility] OR;  Service: Orthopedics;  Laterality: Right;    Social History   Socioeconomic History  . Marital status: Married    Spouse name: Not on file  . Number of children: Not on file  . Years of education: Not on file  . Highest education level: Not on file  Occupational History  . Not on file  Social Needs  . Financial resource strain: Not on file  . Food insecurity:    Worry: Not on file    Inability: Not on file  . Transportation needs:    Medical: Not on file    Non-medical: Not on file  Tobacco Use  . Smoking status: Never Smoker  . Smokeless tobacco: Never Used  Substance and Sexual Activity  . Alcohol use: No  . Drug use: No  . Sexual activity: Not on file  Lifestyle  . Physical activity:    Days per week: Not on file    Minutes per session: Not on file  . Stress: Not on file  Relationships  . Social connections:    Talks on phone: Not on file    Gets together: Not on file    Attends religious service: Not on file    Active member of club or organization: Not  on file    Attends meetings of clubs or organizations: Not on file    Relationship status: Not on file  . Intimate partner violence:    Fear of current or ex partner: Not on file    Emotionally abused: Not on file    Physically abused: Not on file    Forced sexual activity: Not on file  Other Topics Concern  . Not on file  Social History Narrative  . Not on file    Current Outpatient Medications on File Prior to Visit  Medication Sig Dispense Refill  . aspirin EC 81 MG tablet Take 81 mg by mouth daily.    . insulin regular (NOVOLIN R RELION) 100 units/mL injection Inject 0.09-0.11 mLs (9-11 Units total) into the skin 3 (three) times daily before meals. 10 mL 3  . Insulin Syringe-Needle U-100 (B-D INS SYR  ULTRAFINE .3CC/31G) 31G X 5/16" 0.3 ML MISC Use as directed 100 each 11  . lisinopril (PRINIVIL,ZESTRIL) 10 MG tablet Take 1 tablet (10 mg total) by mouth daily. 30 tablet 0  . Multiple Vitamin (MULTIVITAMIN WITH MINERALS) TABS tablet Take 1 tablet by mouth daily.    . nitroGLYCERIN (NITRODUR - DOSED IN MG/24 HR) 0.2 mg/hr patch APPLY 1 PATCH TOPICALLY ONCE DAILY 30 patch 12  . pantoprazole (PROTONIX) 40 MG tablet Take 1 tablet (40 mg total) by mouth daily. 30 tablet 3   No current facility-administered medications on file prior to visit.     No Known Allergies  Family History  Problem Relation Age of Onset  . Hypertension Mother   . Diabetes Mother   . Cancer Mother   . Prostate cancer Father     BP (!) 178/88 (BP Location: Right Arm, Cuff Size: Normal)   Pulse 88   Ht 6' (1.829 m)   Wt 192 lb 9.6 oz (87.4 kg)   SpO2 96%   BMI 26.12 kg/m    Review of Systems Denies LOC    Objective:   Physical Exam VITAL SIGNS:  See vs page GENERAL: no distress.   Pulses: left dorsalis pedis intact.   MSK:  Left foot transmetatarsal amputation, well-healed.  Ext: Right BKA.  Neuro: sensation is intact to touch on the left foot CV: no edema of the left leg.  Lab Results  Component Value Date   CREATININE 1.24 10/04/2016   BUN 22 (H) 10/04/2016   NA 136 10/04/2016   K 4.3 10/04/2016   CL 103 10/04/2016   CO2 24 10/04/2016        Assessment & Plan:  HTN: is noted today Type 1 DM, with PAD: overcontrolled Renal insuff: basal insulin is chosen for reduction.  This is supported by pattern of cbg's.   Patient Instructions  Your blood pressure is high today.  Please see your primary care provider soon, to have it rechecked Please reduce the lantus to 8 units at bedtime.   check your blood sugar 4 times a day: before the 3 meals, and at bedtime.  also check if you have symptoms of your blood sugar being too high or too low.  please keep a record of the readings and bring it to  your next appointment here (or you can bring the meter itself).  You can write it on any piece of paper.  please call us sooner if your blood sugar goes below 70, or if you have a lot of readings over 200. Please come back for a follow-up appointment in 4 months.

## 2018-01-13 ENCOUNTER — Other Ambulatory Visit (INDEPENDENT_AMBULATORY_CARE_PROVIDER_SITE_OTHER): Payer: Self-pay

## 2018-01-13 ENCOUNTER — Other Ambulatory Visit (INDEPENDENT_AMBULATORY_CARE_PROVIDER_SITE_OTHER): Payer: Self-pay | Admitting: Physician Assistant

## 2018-01-13 ENCOUNTER — Telehealth (INDEPENDENT_AMBULATORY_CARE_PROVIDER_SITE_OTHER): Payer: Self-pay | Admitting: Orthopaedic Surgery

## 2018-01-13 MED ORDER — NITROGLYCERIN 0.2 MG/HR TD PT24: 0.2000 mg | MEDICATED_PATCH | Freq: Every day | TRANSDERMAL | 11 refills | Status: DC

## 2018-01-13 MED ORDER — NITROGLYCERIN 0.2 MG/HR TD PT24: 0.2000 mg | MEDICATED_PATCH | Freq: Every day | TRANSDERMAL | 12 refills | Status: DC

## 2018-01-13 NOTE — Telephone Encounter (Signed)
Patient left a message this morning requesting an RX refill on his Nitroglycerin.  It was last filled by [name] in 2018.  CB#[phone].  Thank you.

## 2018-01-13 NOTE — Telephone Encounter (Signed)
I called and advised pt that rx has been faxed to pharm and called in as well.

## 2018-01-13 NOTE — Telephone Encounter (Signed)
Can you see if Gordon Walker can advise?  [name] last prescribed 12/2016 for #30 with 12 refills.

## 2018-02-06 ENCOUNTER — Telehealth: Payer: Self-pay | Admitting: Endocrinology

## 2018-02-06 NOTE — Telephone Encounter (Signed)
Patient states they pharmacy stated insulin glargine (LANTUS) 100 UNIT/ML injection is needing a prior auth before filling. Please Advise, thanks

## 2018-02-07 NOTE — Telephone Encounter (Signed)
done

## 2018-02-07 NOTE — Telephone Encounter (Signed)
Called # on back of pt insurance card and was given a website by a recording to download PA form, form has been filled out and placed on Dr. Everardo All desk for [employer]

## 2018-02-07 NOTE — Telephone Encounter (Signed)
Paperwork faxed °

## 2018-02-10 ENCOUNTER — Telehealth: Payer: Self-pay

## 2018-02-10 ENCOUNTER — Other Ambulatory Visit: Payer: Self-pay

## 2018-02-10 DIAGNOSIS — E1052 Type 1 diabetes mellitus with diabetic peripheral angiopathy with gangrene: Secondary | ICD-10-CM

## 2018-02-10 MED ORDER — BASAGLAR KWIKPEN 100 UNIT/ML ~~LOC~~ SOPN: 8.0000 [IU] | PEN_INJECTOR | Freq: Every day | SUBCUTANEOUS | 11 refills | Status: DC

## 2018-02-10 NOTE — Telephone Encounter (Signed)
Received notification from Rx Benefits stating Lantus has been denied. Basaglar or Levemir are preferred medications on pt insurance plan. New Rx sent to pt pharmacy for Basaglar. Called pt and made him aware. Verbalized acceptance and understanding.

## 2018-02-10 NOTE — Telephone Encounter (Signed)
PA initiated today through Cover My Meds for Lantus. Will await insurance response re: approval/denial.   Charletta Cousin (Key: [id number])  Need help? Call us at [phone]   Outcome  N/A  Next Steps  The plan will fax you a determination, typically within 1 to 5 business days. How do I follow up?  DrugLantus 100UNIT/ML solution  Form[employer] NON-MEDICARE Clinical Prior Authorization Criteria General Request FormPrior Authorization for [employer] non-Medicare members (NOT FOR MEDICARE [phone]

## 2018-02-12 ENCOUNTER — Telehealth: Payer: Self-pay | Admitting: Endocrinology

## 2018-02-12 NOTE — Telephone Encounter (Signed)
please call patient: Please change basaglar to [facility] "[facility]," 5 units qhs. I'll see you next time.

## 2018-02-12 NOTE — Telephone Encounter (Signed)
Patient came into the office stating he did not want to pay the money for Lantus and he did not want to take Basaglar or Levmir. Discussed with patient we could provide other alternatives but without his help we will not know what else is covered with insurance. He stated "ok, I just don't won't to pay that."  Please Advise, thanks

## 2018-02-12 NOTE — Telephone Encounter (Signed)
Please advise 

## 2018-02-13 ENCOUNTER — Other Ambulatory Visit: Payer: Self-pay

## 2018-02-13 NOTE — Telephone Encounter (Signed)
D/C'd basaglar and lft pt vm to return call to discuss change in medication

## 2018-02-13 NOTE — Telephone Encounter (Signed)
Spoke to pt and informed him of change

## 2018-04-08 IMAGING — NM NM GASTRIC EMPTYING
4 series · 4 of 4 positions shown · non-contrast
Comparison: None.

CLINICAL DATA: Initial evaluation for nonspecific abdominal pain.
Evaluate gastric emptying.

EXAM:
NUCLEAR MEDICINE GASTRIC EMPTYING SCAN
TECHNIQUE: After oral ingestion of radiolabeled meal, sequential abdominal
images were obtained for 120 minutes. Residual percentage of
activity remaining within the stomach was calculated at 60 and 120
minutes.
RADIOPHARMACEUTICALS:  2 millicurie mCi Jc-33m sulfur colloid in
standardized meal

[Series 1: 0 min · 4.14mm/px · 1 of 1 slices shown]
[im 1/1]
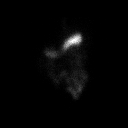

[ge gastric empty · 2.52mm/px · 1 of 1 slices shown (1 of 3)]
[im 1/1]
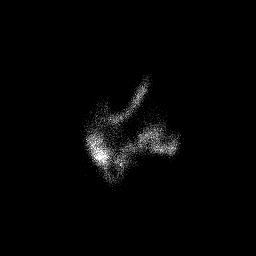

[ge gastric empty · 2.51mm/px · 1 of 1 slices shown (2 of 3)]
[im 1/1]
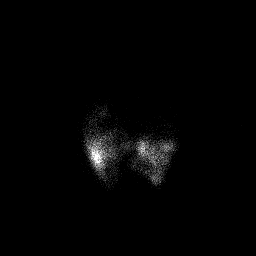

[ge gastric empty · 2.52mm/px · 1 of 1 slices shown (3 of 3)]
[im 1/1]
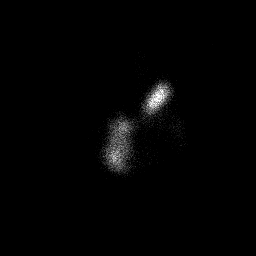

[4 of 4 positions shown; findings below may reference images not displayed]

FINDINGS: Expected location of the stomach in the left upper quadrant.
Ingested meal empties the stomach gradually over the course of the
study with 60%% retention at 60 min and 41%% retention at 120 min
(normal retention less than 30% at a 120 min).
IMPRESSION: Mildly delayed gastric emptying as detailed above.

## 2018-05-08 ENCOUNTER — Encounter: Payer: Self-pay | Admitting: Endocrinology

## 2018-05-09 ENCOUNTER — Ambulatory Visit (INDEPENDENT_AMBULATORY_CARE_PROVIDER_SITE_OTHER): Payer: BLUE CROSS/BLUE SHIELD

## 2018-05-09 ENCOUNTER — Other Ambulatory Visit: Payer: Self-pay

## 2018-05-09 ENCOUNTER — Ambulatory Visit (INDEPENDENT_AMBULATORY_CARE_PROVIDER_SITE_OTHER): Payer: BLUE CROSS/BLUE SHIELD | Admitting: Endocrinology

## 2018-05-09 DIAGNOSIS — L97509 Non-pressure chronic ulcer of other part of unspecified foot with unspecified severity: Secondary | ICD-10-CM | POA: Diagnosis not present

## 2018-05-09 DIAGNOSIS — Z794 Long term (current) use of insulin: Secondary | ICD-10-CM | POA: Diagnosis not present

## 2018-05-09 DIAGNOSIS — E11621 Type 2 diabetes mellitus with foot ulcer: Secondary | ICD-10-CM

## 2018-05-09 DIAGNOSIS — E1165 Type 2 diabetes mellitus with hyperglycemia: Secondary | ICD-10-CM | POA: Diagnosis not present

## 2018-05-09 DIAGNOSIS — E1052 Type 1 diabetes mellitus with diabetic peripheral angiopathy with gangrene: Secondary | ICD-10-CM

## 2018-05-09 DIAGNOSIS — IMO0002 Reserved for concepts with insufficient information to code with codable children: Secondary | ICD-10-CM

## 2018-05-09 LAB — POCT GLYCOSYLATED HEMOGLOBIN (HGB A1C): Hemoglobin A1C: 7 % — AB (ref 4.0–5.6)

## 2018-05-09 MED ORDER — INSULIN LISPRO (1 UNIT DIAL) 100 UNIT/ML (KWIKPEN): 9.0000 [IU] | PEN_INJECTOR | Freq: Three times a day (TID) | SUBCUTANEOUS | 11 refills | Status: DC

## 2018-05-09 MED ORDER — BASAGLAR KWIKPEN 100 UNIT/ML ~~LOC~~ SOPN: 11.0000 [IU] | PEN_INJECTOR | Freq: Every day | SUBCUTANEOUS | 11 refills | Status: DC

## 2018-05-09 NOTE — Progress Notes (Addendum)
Subjective:    Patient ID: Gordon Walker, male    DOB: 01-Jan-1966, 53 y.o.   MRN: [id number]  HPI  telehealth visit today via doxy video visit.  Alternatives to telehealth are presented to this patient, and the patient agrees to the telehealth visit. Pt is advised of the cost of the visit, and agrees to this, also.   Patient is at home, and I am at the office.   Pt returns for f/u of diabetes mellitus:  DM type: 1 Dx'ed: 1998 Complications: polyneuropathy, PAD, bilat LE amputations, renal insuff, and DR.  Therapy: insulin since dx.  DKA: never.   Severe hypoglycemia: last episode was 2017.   Pancreatitis: never.   Other: he takes multiple daily injections; he declines pump rx.   Interval history: He still has mild hypoglycemia, and these episodes are mild.  no cbg record, but states cbg's vary from 50-200's.  It is highest in the afternoon, and lowest at lunch.   Insurance wants him to change insulins. Past Medical History:  Diagnosis Date  . Complication of anesthesia    anesthesia complication 01/18/16; pt unaware what happened   . Dehiscence of amputation stump (HCC)    right  . Diabetes mellitus without complication (HCC)    Type 1  . Heart murmur    "years ago"  . Hypertension    "years ago"  . 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 AMPUTATION Left 01/07/2015   Procedure: Left Foot 1st Ray Amputation;  Surgeon: Nadara Mustard, MD;  Location: [facility] OR;  Service: Orthopedics;  Laterality: Left;  . AMPUTATION Left 07/15/2015   Procedure: Left Transmetatarsal Amputation;  Surgeon: Nadara Mustard, MD;  Location: [facility] OR;  Service: Orthopedics;  Laterality: Left;  . AMPUTATION Right 12/30/2015   Procedure: 5th Ray Amputation Right Foot;  Surgeon: Nadara Mustard, MD;  Location: [facility] OR;  Service: Orthopedics;  Laterality: Right;  . AMPUTATION Right  01/15/2016   Procedure: GUILLOTINE AMPUTATION  ABOVE THE ANKLE AMPUTATION;  Surgeon: Eldred Manges, MD;  Location: [facility] OR;  Service: Orthopedics;  Laterality: Right;  . AMPUTATION Right 01/18/2016   Procedure: AMPUTATION BELOW KNEE;  Surgeon: Eldred Manges, MD;  Location: [facility] OR;  Service: Orthopedics;  Laterality: Right;  . STUMP REVISION Right 03/21/2016   Procedure: Right Below Knee Amputation Stump Revision, WOUND VAC application;  Surgeon: Eldred Manges, MD;  Location: [facility] OR;  Service: Orthopedics;  Laterality: Right;    Social History   Socioeconomic History  . Marital status: Married    Spouse name: Not on file  . Number of children: Not on file  . Years of education: Not on file  . Highest education level: Not on file  Occupational History  . Not on file  Social Needs  . Financial resource strain: Not on file  . Food insecurity:    Worry: Not on file    Inability: Not on file  . Transportation needs:    Medical: Not on file    Non-medical: Not on file  Tobacco Use  . Smoking status: Never Smoker  . Smokeless tobacco: Never Used  Substance and Sexual Activity  . Alcohol use: No  . Drug use: No  . Sexual activity: Not on file  Lifestyle  . Physical activity:    Days per week: Not on file  Minutes per session: Not on file  . Stress: Not on file  Relationships  . Social connections:    Talks on phone: Not on file    Gets together: Not on file    Attends religious service: Not on file    Active member of club or organization: Not on file    Attends meetings of clubs or organizations: Not on file    Relationship status: Not on file  . Intimate partner violence:    Fear of current or ex partner: Not on file    Emotionally abused: Not on file    Physically abused: Not on file    Forced sexual activity: Not on file  Other Topics Concern  . Not on file  Social History Narrative  . Not on file    Current Outpatient Medications on File Prior to Visit  Medication Sig  Dispense Refill  . aspirin EC 81 MG tablet Take 81 mg by mouth daily.    . Insulin Syringe-Needle U-100 (B-D INS SYR ULTRAFINE .3CC/31G) 31G X 5/16" 0.3 ML MISC Use as directed 100 each 11  . lisinopril (PRINIVIL,ZESTRIL) 10 MG tablet Take 1 tablet (10 mg total) by mouth daily. 30 tablet 0  . Multiple Vitamin (MULTIVITAMIN WITH MINERALS) TABS tablet Take 1 tablet by mouth daily.    . nitroGLYCERIN (NITRODUR - DOSED IN MG/24 HR) 0.2 mg/hr patch Place 1 patch (0.2 mg total) onto the skin daily. 30 patch 11  . pantoprazole (PROTONIX) 40 MG tablet Take 1 tablet (40 mg total) by mouth daily. 30 tablet 3   No current facility-administered medications on file prior to visit.     No Known Allergies  Family History  Problem Relation Age of Onset  . Hypertension Mother   . Diabetes Mother   . Cancer Mother   . Prostate cancer Father      Review of Systems Denies LOC.      Objective:   Physical Exam   Lab Results  Component Value Date   CREATININE 1.24 10/04/2016   BUN 22 (H) 10/04/2016   NA 136 10/04/2016   K 4.3 10/04/2016   CL 103 10/04/2016   CO2 24 10/04/2016      Assessment & Plan:  Type 1 DM, with PAD: uncertain glycemic control Hypoglycemia: This limits aggressiveness of glycemic control Renal insuff: in this setting, he needs mostly mealtime insulin.    Patient Instructions  Please come in for a1c.   I have sent prescriptions to your pharmacy, to change to basaglar and humalog.   Please come back for a follow-up appointment in 3 months.

## 2018-05-09 NOTE — Progress Notes (Signed)
Pt presents today for nurse visit for completion of A1C. 

## 2018-05-09 NOTE — Patient Instructions (Signed)
Please come in for a1c.   I have sent prescriptions to your pharmacy, to change to basaglar and humalog.   Please come back for a follow-up appointment in 3 months.

## 2018-06-17 ENCOUNTER — Encounter: Payer: Self-pay | Admitting: Endocrinology

## 2018-06-28 IMAGING — US US ABDOMEN LIMITED
1 series · 14 of 25 positions shown · non-contrast
Comparison: None

CLINICAL DATA: Hyperbilirubinemia, type I diabetes mellitus,
hypertension

EXAM:
US ABDOMEN LIMITED - RIGHT UPPER QUADRANT

[Series 1: us abdomen limited · 0.22mm/px · 14 of 31 slices shown]
[im 1/31]
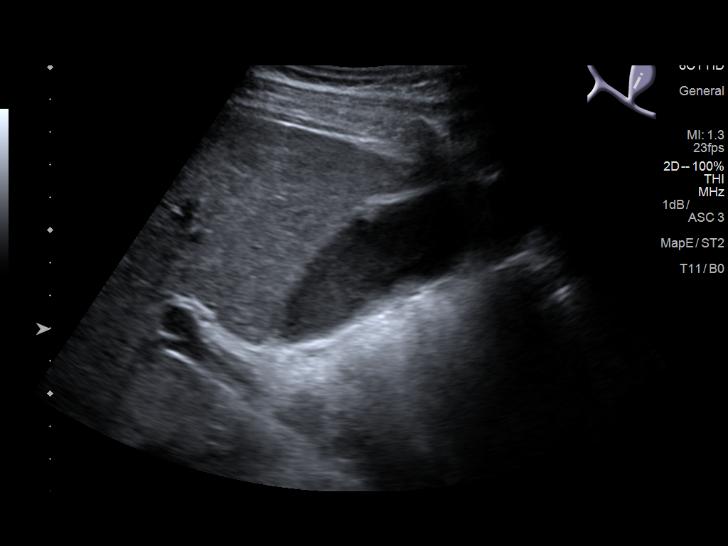
[im 3/31]
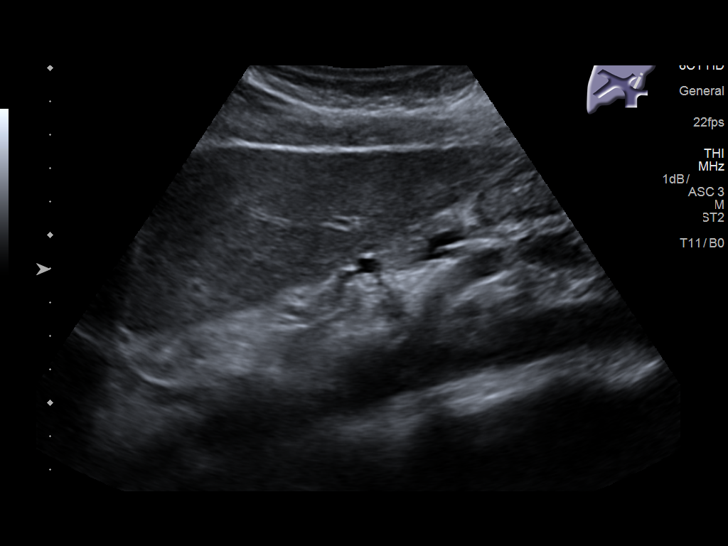
[im 6/31]
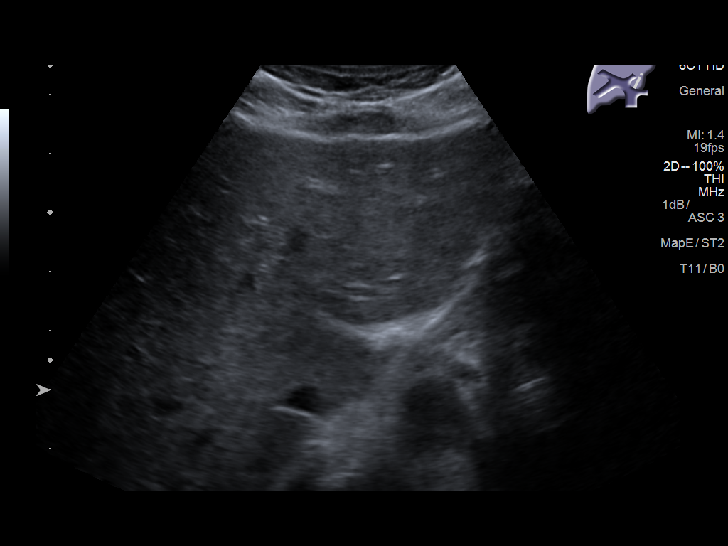
[im 8/31]
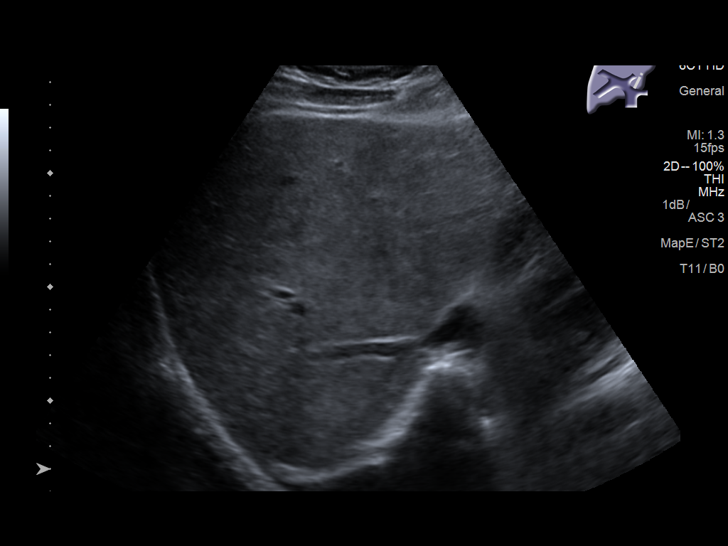
[im 11/31]
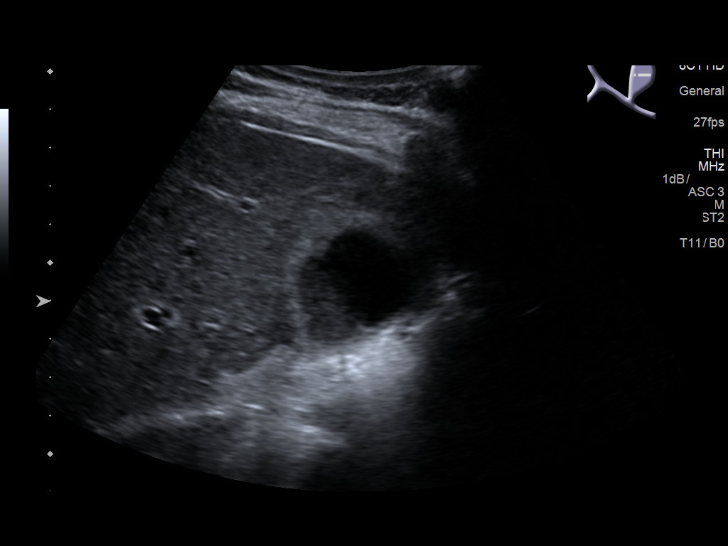
[im 12/31]
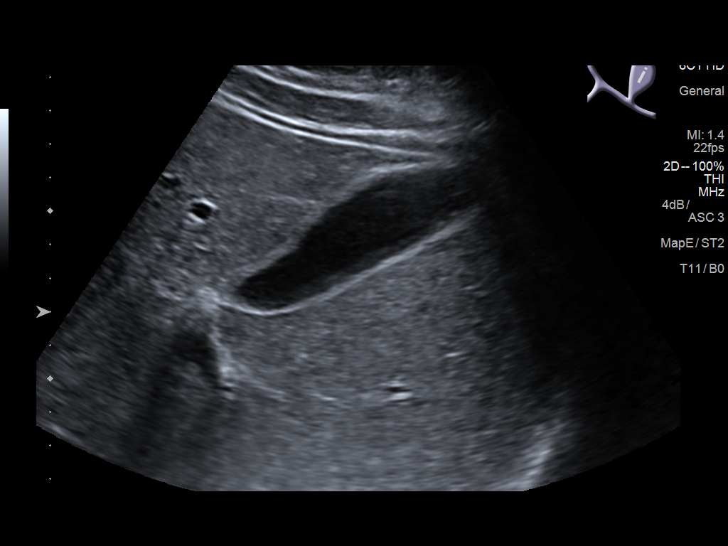
[im 14/31]
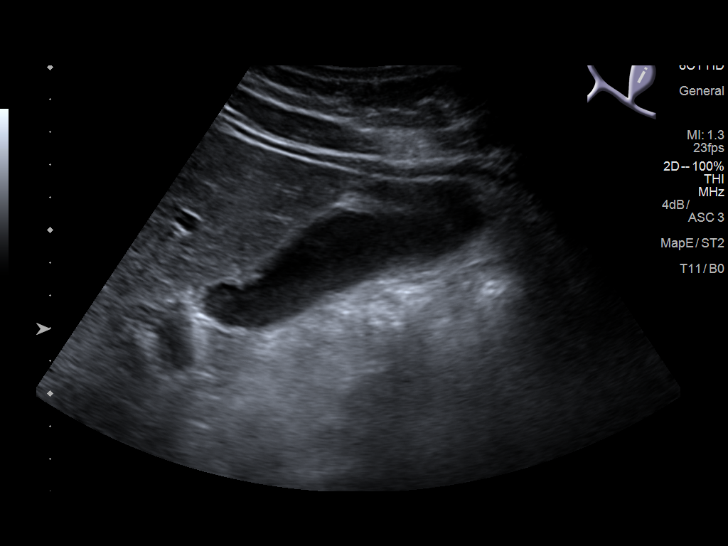
[im 17/31]
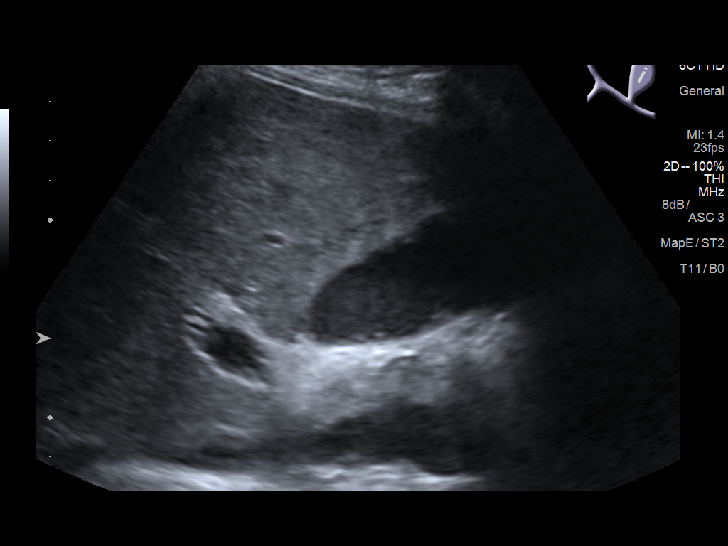
[im 19/31]
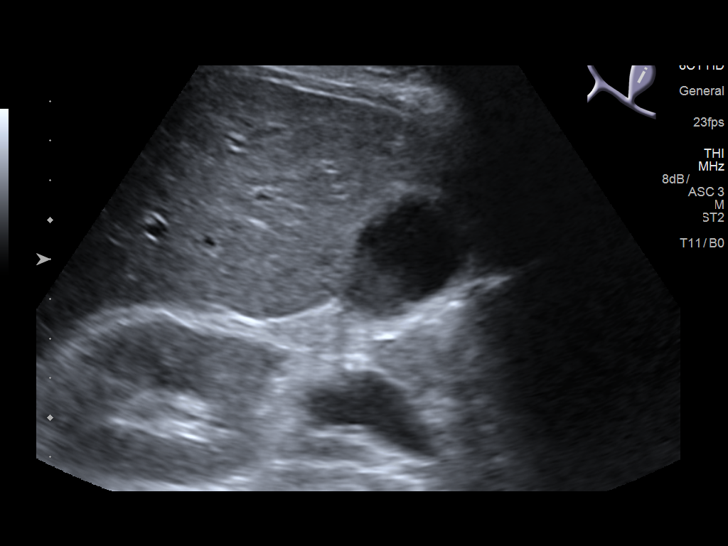
[im 21/31]
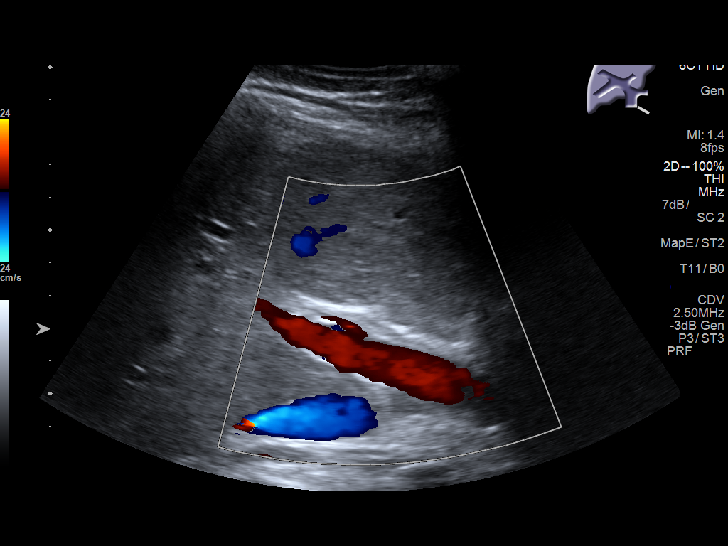
[im 23/31]
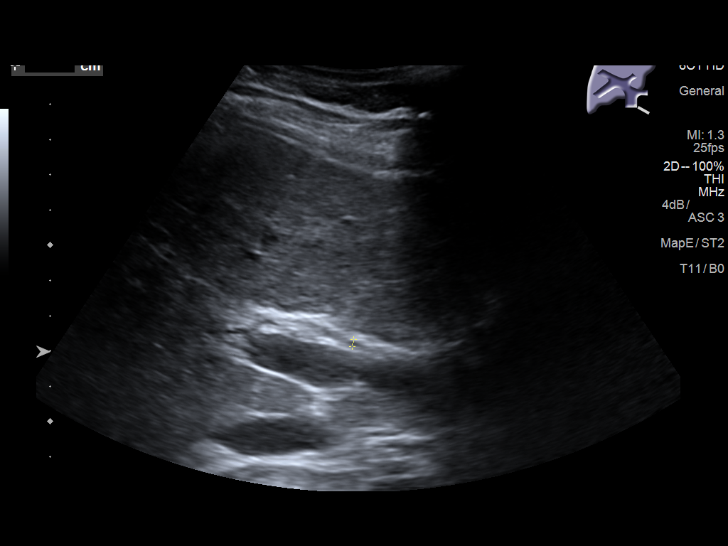
[im 26/31]
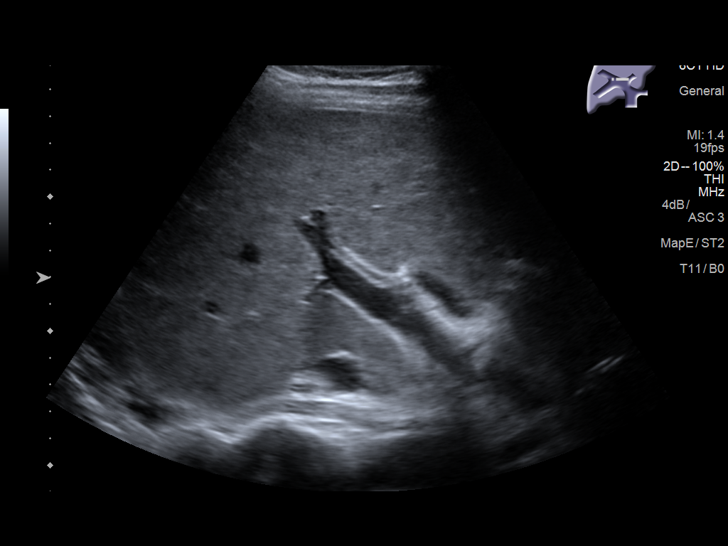
[im 28/31]
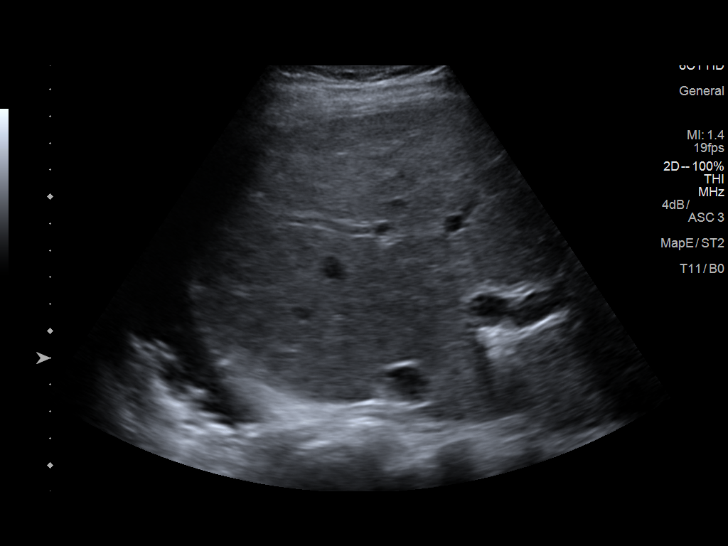
[im 31/31]
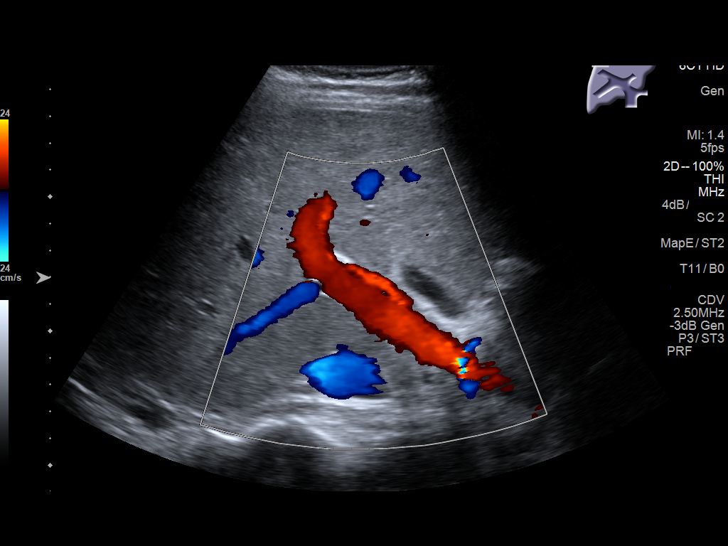

[14 of 25 positions shown; findings below may reference images not displayed]

FINDINGS: Gallbladder:

Normally distended with dependent sludge. No gallbladder wall
thickening, pericholecystic fluid, definite calculi, or sonographic
Murphy sign.

Common bile duct:

Diameter: Normal caliber 2 mm diameter

Liver:

Mildly heterogeneous. Hepatopetal portal venous flow. No definite
hepatic mass or nodularity.

No RIGHT upper quadrant ascites.

Small RIGHT pleural effusion incidentally noted.
IMPRESSION: Small none dependent sludge within gallbladder.

No evidence of cholelithiasis, cholecystitis or biliary dilatation.

Small RIGHT pleural effusion.

## 2018-07-30 ENCOUNTER — Telehealth: Payer: Self-pay | Admitting: Endocrinology

## 2018-07-30 NOTE — Telephone Encounter (Signed)
Patient called in regards to [place] [facility] getting in contact with [place] on Monday for his glucose monitor. They are needing a completed CGM form and medical info.  Fax # [phone]

## 2018-07-30 NOTE — Telephone Encounter (Signed)
This has already been completed and faxed successfully 07/29/18

## 2018-08-03 ENCOUNTER — Encounter: Payer: Self-pay | Admitting: Endocrinology

## 2018-08-05 NOTE — Telephone Encounter (Signed)
[place] [facility] called in regards to needing most recent chart notes.  Please Advise, Thanks

## 2018-08-06 ENCOUNTER — Other Ambulatory Visit: Payer: Self-pay

## 2018-08-08 ENCOUNTER — Ambulatory Visit (INDEPENDENT_AMBULATORY_CARE_PROVIDER_SITE_OTHER): Payer: BC Managed Care – PPO | Admitting: Endocrinology

## 2018-08-08 ENCOUNTER — Other Ambulatory Visit: Payer: Self-pay

## 2018-08-08 ENCOUNTER — Encounter: Payer: Self-pay | Admitting: Endocrinology

## 2018-08-08 VITALS — BP 164/72 | HR 78 | Ht 72.0 in | Wt 189.2 lb

## 2018-08-08 DIAGNOSIS — E1052 Type 1 diabetes mellitus with diabetic peripheral angiopathy with gangrene: Secondary | ICD-10-CM | POA: Diagnosis not present

## 2018-08-08 LAB — POCT GLYCOSYLATED HEMOGLOBIN (HGB A1C): Hemoglobin A1C: 6.7 % — AB (ref 4.0–5.6)

## 2018-08-08 MED ORDER — BASAGLAR KWIKPEN 100 UNIT/ML ~~LOC~~ SOPN: 10.0000 [IU] | PEN_INJECTOR | Freq: Every day | SUBCUTANEOUS | 11 refills | Status: DC

## 2018-08-08 NOTE — Progress Notes (Signed)
Subjective:    Patient ID: Gordon Walker, male    DOB: 10/20/1965, 53 y.o.   MRN: [id number]  HPI Pt returns for f/u of diabetes mellitus:  DM type: 1 Dx'ed: 1998 Complications: polyneuropathy, PAD, bilat LE amputations, renal insuff, and DR.  Therapy: insulin since dx.  DKA: never.   Severe hypoglycemia: last episode was 2017.   Pancreatitis: never.   Other: he takes multiple daily injections; he declines pump rx.   Interval history: He has mild hypoglycemia approx twice per week.  these episodes are mild.  This happens fasting.  no cbg record, but states cbg's vary from 40-300.  It is in general higher as the day goes on.  pt states he feels well in general. Past Medical History:  Diagnosis Date  . Complication of anesthesia    anesthesia complication 01/18/16; pt unaware what happened   . Dehiscence of amputation stump (HCC)    right  . Diabetes mellitus without complication (HCC)    Type 1  . Heart murmur    "years ago"  . Hypertension    "years ago"  . 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 AMPUTATION Left 01/07/2015   Procedure: Left Foot 1st Ray Amputation;  Surgeon: Nadara MustardMarcus Duda V, MD;  [facility];  Service: Orthopedics;  Laterality: Left;  . AMPUTATION Left 07/15/2015   Procedure: Left Transmetatarsal Amputation;  Surgeon: Nadara MustardMarcus V Duda, MD;  Location: [facility] OR;  Service: Orthopedics;  Laterality: Left;  . AMPUTATION Right 12/30/2015   Procedure: 5th Ray Amputation Right Foot;  Surgeon: Nadara MustardMarcus V Duda, MD;  [facility];  Service: Orthopedics;  Laterality: Right;  . AMPUTATION Right 01/15/2016   Procedure: GUILLOTINE AMPUTATION  ABOVE THE ANKLE AMPUTATION;  Surgeon: Eldred MangesMark C Yates, MD;  Location: [facility] OR;  Service: Orthopedics;  Laterality: Right;  . AMPUTATION Right 01/18/2016   Procedure: AMPUTATION BELOW KNEE;  Surgeon: Eldred MangesMark C  Yates, MD;  Location: [facility] OR;  Service: Orthopedics;  Laterality: Right;  . STUMP REVISION Right 03/21/2016   Procedure: Right Below Knee Amputation Stump Revision, WOUND VAC application;  Surgeon: Eldred MangesMark C Yates, MD;  Location: [facility] OR;  Service: Orthopedics;  Laterality: Right;    Social History   Socioeconomic History  . Marital status: Married    Spouse name: Not on file  . Number of children: Not on file  . Years of education: Not on file  . Highest education level: Not on file  Occupational History  . Not on file  Social Needs  . Financial resource strain: Not on file  . Food insecurity    Worry: Not on file    Inability: Not on file  . Transportation needs    Medical: Not on file    Non-medical: Not on file  Tobacco Use  . Smoking status: Never Smoker  . Smokeless tobacco: Never Used  Substance and Sexual Activity  . Alcohol use: No  . Drug use: No  . Sexual activity: Not on file  Lifestyle  . Physical activity    Days per week: Not on file    Minutes per session: Not on file  . Stress: Not on file  Relationships  . Social Musicianconnections    Talks on phone: Not on file    Gets together: Not on file    Attends religious service: Not on file  Active member of club or organization: Not on file    Attends meetings of clubs or organizations: Not on file    Relationship status: Not on file  . Intimate partner violence    Fear of current or ex partner: Not on file    Emotionally abused: Not on file    Physically abused: Not on file    Forced sexual activity: Not on file  Other Topics Concern  . Not on file  Social History Narrative  . Not on file    Current Outpatient Medications on File Prior to Visit  Medication Sig Dispense Refill  . aspirin EC 81 MG tablet Take 81 mg by mouth daily.    . insulin lispro (HUMALOG KWIKPEN) 100 UNIT/ML KwikPen Inject 0.09-0.11 mLs (9-11 Units total) into the skin 3 (three) times daily with meals. 15 mL 11  . Insulin Syringe-Needle  U-100 (B-D INS SYR ULTRAFINE .3CC/31G) 31G X 5/16" 0.3 ML MISC Use as directed 100 each 11  . lisinopril (PRINIVIL,ZESTRIL) 10 MG tablet Take 1 tablet (10 mg total) by mouth daily. 30 tablet 0  . Multiple Vitamin (MULTIVITAMIN WITH MINERALS) TABS tablet Take 1 tablet by mouth daily.    . nitroGLYCERIN (NITRODUR - DOSED IN MG/24 HR) 0.2 mg/hr patch Place 1 patch (0.2 mg total) onto the skin daily. 30 patch 11  . pantoprazole (PROTONIX) 40 MG tablet Take 1 tablet (40 mg total) by mouth daily. 30 tablet 3   No current facility-administered medications on file prior to visit.     No Known Allergies  Family History  Problem Relation Age of Onset  . Hypertension Mother   . Diabetes Mother   . Cancer Mother   . Prostate cancer Father     BP (!) 164/72 (BP Location: Left Arm, Patient Position: Sitting, Cuff Size: Normal)   Pulse 78   Ht 6' (1.829 m)   Wt 189 lb 3.2 oz (85.8 kg)   SpO2 98%   BMI 25.66 kg/m    Review of Systems Denies LOC    Objective:   Physical Exam VITAL SIGNS:  See vs page GENERAL: no distress.   Pulses: left dorsalis pedis intact.   MSK:  Left foot transmetatarsal amputation.  Old healed surgical scar Ext: Right BKA.  Neuro: sensation is intact to touch on the left foot CV: no edema of the left leg.  Lab Results  Component Value Date   CREATININE 1.24 10/04/2016   BUN 22 (H) 10/04/2016   NA 136 10/04/2016   K 4.3 10/04/2016   CL 103 10/04/2016   CO2 24 10/04/2016     Lab Results  Component Value Date   HGBA1C 6.7 (A) 08/08/2018       Assessment & Plan:  HTN: is noted today Type 1 DM, with PAD: well-controlled Hypoglycemia: this limits aggressiveness of glycemic control.    Patient Instructions  Your blood pressure is high today.  Please see your primary care provider soon, to have it rechecked.    please reduce the basaglar to 10 units daily, and: Please continue the same humalog. check your blood sugar 4 times a day: before the 3 meals,  and at bedtime.  also check if you have symptoms of your blood sugar being too high or too low.  please keep a record of the readings and bring it to your next appointment here (or you can bring the meter itself).  You can write it on any piece of paper.  please call us  sooner if your blood sugar goes below 70, or if you have a lot of readings over 200. Please come back for a follow-up appointment in 3-4 months.

## 2018-08-08 NOTE — Patient Instructions (Addendum)
Your blood pressure is high today.  Please see your primary care provider soon, to have it rechecked.    please reduce the basaglar to 10 units daily, and: Please continue the same humalog. check your blood sugar 4 times a day: before the 3 meals, and at bedtime.  also check if you have symptoms of your blood sugar being too high or too low.  please keep a record of the readings and bring it to your next appointment here (or you can bring the meter itself).  You can write it on any piece of paper.  please call us sooner if your blood sugar goes below 70, or if you have a lot of readings over 200. Please come back for a follow-up appointment in 3-4 months.

## 2018-08-13 ENCOUNTER — Telehealth: Payer: Self-pay | Admitting: Endocrinology

## 2018-08-13 NOTE — Telephone Encounter (Signed)
According to request received from [facility], there was no request made for office notes to be included. Only request made was for Dr. Loanne Drilling to complete and sign order. Recent office notes have been printed today and faxed along with completed and signed order (previously faxed successfully 07/29/18). Marked fax cover as EXPEDITED REQUEST. Confirmation received.

## 2018-08-13 NOTE — Telephone Encounter (Signed)
Cassandra with [facility] ph# [phone] called re; status of fax sent 08/01/18 & 08/05/18 requesting latest office visit notes. Cassandra would like to know how long it will take to get the above request back-patient is inquiring. Please call Cassandra at the ph# listed above to advise.

## 2018-08-27 ENCOUNTER — Ambulatory Visit: Payer: BC Managed Care – PPO | Admitting: Family

## 2018-08-27 ENCOUNTER — Telehealth: Payer: Self-pay | Admitting: Family

## 2018-08-27 ENCOUNTER — Telehealth: Payer: Self-pay | Admitting: Orthopedic Surgery

## 2018-08-27 NOTE — Telephone Encounter (Signed)
Patient was called and informed that we will fax Rx to [place], patient understood.

## 2018-08-27 NOTE — Telephone Encounter (Signed)
Done. Telephone visit scheduled for today at 3:30.

## 2018-08-27 NOTE — Telephone Encounter (Signed)
Pt called in requesting a prescription for an insert for her left shoe due to his amputation.   [phone]

## 2018-08-27 NOTE — Telephone Encounter (Signed)
Will you put on schedule for today for telephone visit

## 2018-08-27 NOTE — Telephone Encounter (Signed)
[name]- please advise. Thank you.

## 2018-08-27 NOTE — Telephone Encounter (Signed)
Spoke with patient he asked if he need to come by to pick up the Rx for [employer]. The number to contact patient is [phone]

## 2018-09-17 LAB — BASIC METABOLIC PANEL
BUN: 28 — AB (ref 4–21)
Creatinine: 1.2 (ref 0.6–1.3)

## 2018-11-12 ENCOUNTER — Other Ambulatory Visit: Payer: Self-pay | Admitting: Gastroenterology

## 2018-11-12 DIAGNOSIS — R7989 Other specified abnormal findings of blood chemistry: Secondary | ICD-10-CM

## 2018-11-12 DIAGNOSIS — R945 Abnormal results of liver function studies: Secondary | ICD-10-CM

## 2018-11-18 ENCOUNTER — Other Ambulatory Visit: Payer: Self-pay

## 2018-11-19 ENCOUNTER — Ambulatory Visit
Admission: RE | Admit: 2018-11-19 | Discharge: 2018-11-19 | Disposition: A | Discharge status: Home / Self Care | Payer: BC Managed Care – PPO | Source: Ambulatory Visit | Attending: Gastroenterology | Admitting: Gastroenterology

## 2018-11-19 DIAGNOSIS — R945 Abnormal results of liver function studies: Secondary | ICD-10-CM

## 2018-11-19 DIAGNOSIS — R7989 Other specified abnormal findings of blood chemistry: Secondary | ICD-10-CM

## 2018-11-20 ENCOUNTER — Telehealth: Payer: Self-pay

## 2018-11-20 ENCOUNTER — Encounter: Payer: Self-pay | Admitting: Endocrinology

## 2018-11-20 ENCOUNTER — Encounter: Payer: BC Managed Care – PPO | Attending: Family Medicine | Admitting: Dietician

## 2018-11-20 ENCOUNTER — Ambulatory Visit (INDEPENDENT_AMBULATORY_CARE_PROVIDER_SITE_OTHER): Payer: BC Managed Care – PPO | Admitting: Endocrinology

## 2018-11-20 ENCOUNTER — Other Ambulatory Visit: Payer: Self-pay

## 2018-11-20 VITALS — BP 152/70 | HR 82 | Ht 72.0 in | Wt 190.8 lb

## 2018-11-20 DIAGNOSIS — E1052 Type 1 diabetes mellitus with diabetic peripheral angiopathy with gangrene: Secondary | ICD-10-CM

## 2018-11-20 LAB — POCT GLYCOSYLATED HEMOGLOBIN (HGB A1C): Hemoglobin A1C: 6.6 % — AB (ref 4.0–5.6)

## 2018-11-20 MED ORDER — PEN NEEDLES 31G X 6 MM MISC: 1.0000 | Freq: Four times a day (QID) | 3 refills | Status: DC

## 2018-11-20 MED ORDER — INSULIN PEN NEEDLE 32G X 6 MM MISC: 1.0000 | Freq: Four times a day (QID) | 2 refills | Status: DC

## 2018-11-20 NOTE — Telephone Encounter (Signed)
Insulin Pen Needle 32G X 6 MM MISC 200 each 2 11/20/2018    Sig - Route: 1 each by Does not apply route 4 (four) times daily. E10.52 - Does not apply   Sent to pharmacy as: Insulin Pen Needle 32G X 6 MM Misc   E-Prescribing Status: Receipt confirmed by pharmacy (11/20/2018 10:56 AM EDT)

## 2018-11-20 NOTE — Patient Instructions (Addendum)
Your blood pressure is high today.  Please see your primary care provider soon, to have it rechecked.   Please continue the same insulins.   check your blood sugar 4 times a day: before the 3 meals, and at bedtime.  also check if you have symptoms of your blood sugar being too high or too low.  please keep a record of the readings and bring it to your next appointment here (or you can bring the meter itself).  You can write it on any piece of paper.  please call us sooner if your blood sugar goes below 70, or if you have a lot of readings over 200.   Please come back for a follow-up appointment in 2 weeks.

## 2018-11-20 NOTE — Progress Notes (Addendum)
Patient seen by Dr. Loanne Drilling today. Put a Free Style Libre Pro on patient and started this. Instructed him that he should return in 2 weeks and bring the sensor with him.   Instructed him to fill out the food, BG, and medication log daily and this will be compared to the [employer].  Patient with no questions. Appointment with Dr. Loanne Drilling in 2 weeks has been confirmed.  Antonieta Iba, RD, LDN, CDE

## 2018-11-20 NOTE — Progress Notes (Signed)
Subjective:    Patient ID: Gordon Walker, male    DOB: 06/30/1965, 53 y.o.   MRN: [id number]  HPI Pt returns for f/u of diabetes mellitus:  DM type: 1 Dx'ed: 8469 Complications: polyneuropathy, PAD, bilat LE amputations, renal insuff, and DR.  Therapy: insulin since dx.  DKA: never.   Severe hypoglycemia: last episode was 2017.   Pancreatitis: never.   Other: he takes multiple daily injections; he declines pump rx.   Interval history: no cbg record, but states cbg's vary from 77-180.  It is in general higher as the day goes on.  pt states he feels well in general.   Past Medical History:  Diagnosis Date  . Complication of anesthesia    anesthesia complication 62/95/28; pt unaware what happened   . Dehiscence of amputation stump (HCC)    right  . Diabetes mellitus without complication (HCC)    Type 1  . Heart murmur    "years ago"  . Hypertension    "years ago"  . 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 AMPUTATION Left 01/07/2015   Procedure: Left Foot 1st Ray Amputation;  Surgeon: Newt Minion, MD;  Location: [place];  Service: Orthopedics;  Laterality: Left;  . AMPUTATION Left 07/15/2015   Procedure: Left Transmetatarsal Amputation;  Surgeon: Newt Minion, MD;  Location: [place];  Service: Orthopedics;  Laterality: Left;  . AMPUTATION Right 12/30/2015   Procedure: 5th Ray Amputation Right Foot;  Surgeon: Newt Minion, MD;  Location: [place];  Service: Orthopedics;  Laterality: Right;  . AMPUTATION Right 01/15/2016   Procedure: GUILLOTINE AMPUTATION  ABOVE THE ANKLE AMPUTATION;  Surgeon: Marybelle Killings, MD;  Location: [place];  Service: Orthopedics;  Laterality: Right;  . AMPUTATION Right 01/18/2016   Procedure: AMPUTATION BELOW KNEE;  Surgeon: Marybelle Killings, MD;  Location: [place];  Service: Orthopedics;  Laterality: Right;  . STUMP REVISION  Right 03/21/2016   Procedure: Right Below Knee Amputation Stump Revision, WOUND VAC application;  Surgeon: Marybelle Killings, MD;  Location: [place];  Service: Orthopedics;  Laterality: Right;    Social History   Socioeconomic History  . Marital status: Married    Spouse name: Not on file  . Number of children: Not on file  . Years of education: Not on file  . Highest education level: Not on file  Occupational History  . Not on file  Social Needs  . Financial resource strain: Not on file  . Food insecurity    Worry: Not on file    Inability: Not on file  . Transportation needs    Medical: Not on file    Non-medical: Not on file  Tobacco Use  . Smoking status: Never Smoker  . Smokeless tobacco: Never Used  Substance and Sexual Activity  . Alcohol use: No  . Drug use: No  . Sexual activity: Not on file  Lifestyle  . Physical activity    Days per week: Not on file    Minutes per session: Not on file  . Stress: Not on file  Relationships  . Social Herbalist on phone: Not on file    Gets together: Not on file    Attends religious service: Not on file    Active member of club or organization: Not on file    Attends meetings of  clubs or organizations: Not on file    Relationship status: Not on file  . Intimate partner violence    Fear of current or ex partner: Not on file    Emotionally abused: Not on file    Physically abused: Not on file    Forced sexual activity: Not on file  Other Topics Concern  . Not on file  Social History Narrative  . Not on file    Current Outpatient Medications on File Prior to Visit  Medication Sig Dispense Refill  . aspirin EC 81 MG tablet Take 81 mg by mouth daily.    . Insulin Glargine (BASAGLAR KWIKPEN) 100 UNIT/ML SOPN Inject 0.1 mLs (10 Units total) into the skin daily. 5 pen 11  . insulin lispro (HUMALOG KWIKPEN) 100 UNIT/ML KwikPen Inject 0.09-0.11 mLs (9-11 Units total) into the skin 3 (three) times daily with meals. 15 mL 11   . lisinopril (PRINIVIL,ZESTRIL) 10 MG tablet Take 1 tablet (10 mg total) by mouth daily. 30 tablet 0  . Multiple Vitamin (MULTIVITAMIN WITH MINERALS) TABS tablet Take 1 tablet by mouth daily.    . nitroGLYCERIN (NITRODUR - DOSED IN MG/24 HR) 0.2 mg/hr patch Place 1 patch (0.2 mg total) onto the skin daily. 30 patch 11  . pantoprazole (PROTONIX) 40 MG tablet Take 1 tablet (40 mg total) by mouth daily. 30 tablet 3   No current facility-administered medications on file prior to visit.     No Known Allergies  Family History  Problem Relation Age of Onset  . Hypertension Mother   . Diabetes Mother   . Cancer Mother   . Prostate cancer Father     BP (!) 152/70 (BP Location: Left Arm, Patient Position: Sitting, Cuff Size: Normal)   Pulse 82   Ht 6' (1.829 m)   Wt 190 lb 12.8 oz (86.5 kg)   SpO2 96%   BMI 25.88 kg/m    Review of Systems He denies hypoglycemia    Objective:   Physical Exam VITAL SIGNS:  See vs page GENERAL: no distress Pulses: left dorsalis pedis intact.   MSK:  Left transmetatarsal amputation.  Old healed surgical scar Ext: Right BKA.  Neuro: sensation is intact to touch on the left foot.   CV: no edema of the left leg.    Lab Results  Component Value Date   HGBA1C 6.6 (A) 11/20/2018   Lab Results  Component Value Date   CREATININE 1.24 10/04/2016   BUN 22 (H) 10/04/2016   NA 136 10/04/2016   K 4.3 10/04/2016   CL 103 10/04/2016   CO2 24 10/04/2016        Assessment & Plan:  HTN: is noted today.  Type 1 DM, with PAD: overcontrolled. we discussed professional continuous glucose monitor.  He requests professional continuous glucose monitor today.   Renal failure: in this setting, pt needs mostly mealtime insulin.  Patient Instructions  Your blood pressure is high today.  Please see your primary care provider soon, to have it rechecked.   Please continue the same insulins.   check your blood sugar 4 times a day: before the 3 meals, and at  bedtime.  also check if you have symptoms of your blood sugar being too high or too low.  please keep a record of the readings and bring it to your next appointment here (or you can bring the meter itself).  You can write it on any piece of paper.  please call us sooner if your blood  sugar goes below 70, or if you have a lot of readings over 200.   Please come back for a follow-up appointment in 2 weeks.

## 2018-11-20 NOTE — Telephone Encounter (Signed)
Pharmacy called wanting to change needles to 32g x 6 so it can be covered by insurance   Please advise

## 2018-11-24 ENCOUNTER — Telehealth: Payer: Self-pay | Admitting: Nutrition

## 2018-11-24 NOTE — Telephone Encounter (Signed)
Phone call from [facility] that says he called saying that his sensor had come off.  He reports that he came in here Friday and another one had been put on.

## 2018-11-26 NOTE — Progress Notes (Signed)
Professional CGM was placed onto pt by L,Jobe,RD,CDE. CGM was placed on back of right arm, and explained how the CGM works. Pt was informed that if the CGM should come off to place it in a bag and bring it to the office. Pt was also informed that it should last 14 days, but if it comes off before then to bring it back in the bag. Pt verbalized understanding of this.

## 2018-12-02 ENCOUNTER — Other Ambulatory Visit: Payer: Self-pay

## 2018-12-04 ENCOUNTER — Other Ambulatory Visit: Payer: Self-pay

## 2018-12-04 ENCOUNTER — Ambulatory Visit (INDEPENDENT_AMBULATORY_CARE_PROVIDER_SITE_OTHER): Payer: BC Managed Care – PPO | Admitting: Endocrinology

## 2018-12-04 ENCOUNTER — Encounter: Payer: Self-pay | Admitting: Endocrinology

## 2018-12-04 VITALS — BP 138/80 | HR 83 | Ht 72.0 in | Wt 186.6 lb

## 2018-12-04 DIAGNOSIS — E1052 Type 1 diabetes mellitus with diabetic peripheral angiopathy with gangrene: Secondary | ICD-10-CM

## 2018-12-04 MED ORDER — BASAGLAR KWIKPEN 100 UNIT/ML ~~LOC~~ SOPN: 8.0000 [IU] | PEN_INJECTOR | Freq: Every day | SUBCUTANEOUS | 11 refills | Status: DC

## 2018-12-04 MED ORDER — DEXCOM G6 SENSOR MISC: 1.0000 | 3 refills | Status: DC

## 2018-12-04 MED ORDER — DEXCOM G6 TRANSMITTER MISC: 1.0000 | Freq: Once | 0 refills | Status: DC

## 2018-12-04 NOTE — Progress Notes (Signed)
Subjective:    Patient ID: Gordon CousinFrederick Linnemann, male    DOB: [birth year], 53 y.o.   MRN: [id number]  HPI Pt returns for f/u of diabetes mellitus:  DM type: 1 Dx'ed: 1998 Complications: polyneuropathy, PAD, bilat LE amputations, renal insuff, and DR.  Therapy: insulin since dx.  DKA: never.   Severe hypoglycemia: last episode was 2017.   Pancreatitis: never.   Other: he takes multiple daily injections; he declines pump rx.   Interval history: I reviewed continuous glucose monitor data.  glucose varies from 45-400.  It is in general highest in the afternoon, but there is little trend throughout the day.  pt states he feels well in general.  He requests dexcom continuous glucose monitor.   Past Medical History:  Diagnosis Date  . Complication of anesthesia    anesthesia complication 01/18/16; pt unaware what happened   . Dehiscence of amputation stump (HCC)    right  . Diabetes mellitus without complication (HCC)    Type 1  . Heart murmur    "years ago"  . Hypertension    "years ago"  . 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(HCC)     Past Surgical History:  Procedure Laterality Date  . AMPUTATION Left 01/07/2015   Procedure: Left Foot 1st Ray Amputation;  Surgeon: Nadara MustardMarcus Duda V, MD;  [facility];  Service: Orthopedics;  Laterality: Left;  . AMPUTATION Left 07/15/2015   Procedure: Left Transmetatarsal Amputation;  Surgeon: Nadara MustardMarcus V Duda, MD;  [facility];  Service: Orthopedics;  Laterality: Left;  . AMPUTATION Right 12/30/2015   Procedure: 5th Ray Amputation Right Foot;  Surgeon: Nadara MustardMarcus V Duda, MD;  [facility];  Service: Orthopedics;  Laterality: Right;  . AMPUTATION Right 01/15/2016   Procedure: GUILLOTINE AMPUTATION  ABOVE THE ANKLE AMPUTATION;  Surgeon: Eldred MangesMark C Yates, MD;  Location: [facility];  Service: Orthopedics;  Laterality: Right;  . AMPUTATION Right 01/18/2016   Procedure: AMPUTATION BELOW  KNEE;  Surgeon: Eldred MangesMark C Yates, MD;  [facility];  Service: Orthopedics;  Laterality: Right;  . STUMP REVISION Right 03/21/2016   Procedure: Right Below Knee Amputation Stump Revision, WOUND VAC application;  Surgeon: Eldred MangesMark C Yates, MD;  Location: [facility];  Service: Orthopedics;  Laterality: Right;    Social History   Socioeconomic History  . Marital status: Married    Spouse name: Not on file  . Number of children: Not on file  . Years of education: Not on file  . Highest education level: Not on file  Occupational History  . Not on file  Social Needs  . Financial resource strain: Not on file  . Food insecurity    Worry: Not on file    Inability: Not on file  . Transportation needs    Medical: Not on file    Non-medical: Not on file  Tobacco Use  . Smoking status: Never Smoker  . Smokeless tobacco: Never Used  Substance and Sexual Activity  . Alcohol use: No  . Drug use: No  . Sexual activity: Not on file  Lifestyle  . Physical activity    Days per week: Not on file    Minutes per session: Not on file  . Stress: Not on file  Relationships  . Social Musicianconnections    Talks on phone: Not on file    Gets together: Not on file    Attends religious service: Not on file  Active member of club or organization: Not on file    Attends meetings of clubs or organizations: Not on file    Relationship status: Not on file  . Intimate partner violence    Fear of current or ex partner: Not on file    Emotionally abused: Not on file    Physically abused: Not on file    Forced sexual activity: Not on file  Other Topics Concern  . Not on file  Social History Narrative  . Not on file    Current Outpatient Medications on File Prior to Visit  Medication Sig Dispense Refill  . aspirin EC 81 MG tablet Take 81 mg by mouth daily.    . insulin lispro (HUMALOG KWIKPEN) 100 UNIT/ML KwikPen Inject 0.09-0.11 mLs (9-11 Units total) into the skin 3 (three) times daily with meals. 15 mL 11  .  Insulin Pen Needle 32G X 6 MM MISC 1 each by Does not apply route 4 (four) times daily. E10.52 200 each 2  . lisinopril (PRINIVIL,ZESTRIL) 10 MG tablet Take 1 tablet (10 mg total) by mouth daily. 30 tablet 0  . Multiple Vitamin (MULTIVITAMIN WITH MINERALS) TABS tablet Take 1 tablet by mouth daily.    . nitroGLYCERIN (NITRODUR - DOSED IN MG/24 HR) 0.2 mg/hr patch Place 1 patch (0.2 mg total) onto the skin daily. 30 patch 11  . pantoprazole (PROTONIX) 40 MG tablet Take 1 tablet (40 mg total) by mouth daily. 30 tablet 3   No current facility-administered medications on file prior to visit.     No Known Allergies  Family History  Problem Relation Age of Onset  . Hypertension Mother   . Diabetes Mother   . Cancer Mother   . Prostate cancer Father     BP 138/80 (BP Location: Right Arm, Patient Position: Sitting, Cuff Size: Normal)   Pulse 83   Ht 6' (1.829 m)   Wt 186 lb 9.6 oz (84.6 kg)   SpO2 97%   BMI 25.31 kg/m    Review of Systems Denies LOC    Objective:   Physical Exam VITAL SIGNS:  See vs page GENERAL: no distress Pulses: left dorsalis pedis intact.   MSK:  Left transmetatarsal amputation.  Old healed surgical scar Ext: right BKA Neuro: sensation is intact to touch on the left foot CV: no edema of the left leg.   Lab Results  Component Value Date   HGBA1C 6.6 (A) 11/20/2018   Lab Results  Component Value Date   CREATININE 1.24 10/04/2016   BUN 22 (H) 10/04/2016   NA 136 10/04/2016   K 4.3 10/04/2016   CL 103 10/04/2016   CO2 24 10/04/2016       Assessment & Plan:  Type 1 DM, with PAD: overcontrolled Hypoglycemia: this limits aggressiveness of glycemic control.  Renal insuff: in this setting, he needs mostly mealtime insulin.   Patient Instructions  Please reduce the Basaglar to 8 units per day, and continue the same humalog  check your blood sugar 4 times a day: before the 3 meals, and at bedtime.  also check if you have symptoms of your blood sugar  being too high or too low.  please keep a record of the readings and bring it to your next appointment here (or you can bring the meter itself).  You can write it on any piece of paper.  please call us sooner if your blood sugar goes below 70, or if you have a lot of readings over  200.   I have sent a prescription to your pharmacy, for the dexcom continuous glucose monitor.  Please come back for a follow-up appointment in 2 months.

## 2018-12-04 NOTE — Patient Instructions (Addendum)
Please reduce the Basaglar to 8 units per day, and continue the same humalog  check your blood sugar 4 times a day: before the 3 meals, and at bedtime.  also check if you have symptoms of your blood sugar being too high or too low.  please keep a record of the readings and bring it to your next appointment here (or you can bring the meter itself).  You can write it on any piece of paper.  please call us sooner if your blood sugar goes below 70, or if you have a lot of readings over 200.   I have sent a prescription to your pharmacy, for the dexcom continuous glucose monitor.  Please come back for a follow-up appointment in 2 months.

## 2018-12-17 LAB — HM COLONOSCOPY

## 2019-01-05 ENCOUNTER — Other Ambulatory Visit: Payer: Self-pay

## 2019-01-05 ENCOUNTER — Telehealth: Payer: Self-pay

## 2019-01-05 DIAGNOSIS — E1052 Type 1 diabetes mellitus with diabetic peripheral angiopathy with gangrene: Secondary | ICD-10-CM

## 2019-01-05 MED ORDER — DEXCOM G6 SENSOR MISC: 1.0000 | 3 refills | Status: DC

## 2019-01-05 NOTE — Telephone Encounter (Signed)
MEDICATION: Continuous Blood Gluc Sensor (DEXCOM G6 SENSOR) MISC   PHARMACY:  [place], [place].  IS THIS A 90 DAY SUPPLY :   IS PATIENT OUT OF MEDICATION:   IF NOT; HOW MUCH IS LEFT:   LAST APPOINTMENT DATE: @11 /12/2018  NEXT APPOINTMENT DATE:@1 /11/2019  DO WE HAVE YOUR PERMISSION TO LEAVE A DETAILED MESSAGE:  OTHER COMMENTS:    **Let patient know to contact pharmacy at the end of the day to make sure medication is ready. **  ** Please notify patient to allow 48-72 hours to process**  **Encourage patient to contact the pharmacy for refills or they can request refills through [facility]**

## 2019-01-05 NOTE — Telephone Encounter (Signed)
Continuous Blood Gluc Sensor (DEXCOM G6 SENSOR) MISC 10 each 3 01/05/2019    Sig - Route: 1 Device by Does not apply route See admin instructions. Every 10 days - Does not apply   Sent to pharmacy as: Continuous Blood Gluc Sensor (DEXCOM G6 SENSOR) Misc   E-Prescribing Status: Sent to pharmacy (01/05/2019  9:44 AM EST)

## 2019-01-19 ENCOUNTER — Telehealth: Payer: Self-pay | Admitting: Orthopaedic Surgery

## 2019-01-19 MED ORDER — NITROGLYCERIN 0.2 MG/HR TD PT24: 0.2000 mg | MEDICATED_PATCH | Freq: Every day | TRANSDERMAL | 11 refills | Status: DC

## 2019-01-19 NOTE — Telephone Encounter (Signed)
Please advise. Ok for refill?

## 2019-01-19 NOTE — Telephone Encounter (Signed)
Sent to pharmacy. I called patient and advised. 

## 2019-01-19 NOTE — Telephone Encounter (Signed)
OK to do thanks 

## 2019-01-19 NOTE — Telephone Encounter (Signed)
Patient called requesting refill on nitroglycerin patch. Patient pharmacy is [facility] on [address]. Patient phone number is [phone].

## 2019-01-29 ENCOUNTER — Other Ambulatory Visit: Payer: Self-pay

## 2019-02-02 ENCOUNTER — Encounter: Payer: Self-pay | Admitting: Endocrinology

## 2019-02-02 ENCOUNTER — Ambulatory Visit (INDEPENDENT_AMBULATORY_CARE_PROVIDER_SITE_OTHER): Payer: BC Managed Care – PPO | Admitting: Endocrinology

## 2019-02-02 ENCOUNTER — Other Ambulatory Visit: Payer: Self-pay

## 2019-02-02 VITALS — BP 176/98 | HR 95 | Ht 72.0 in | Wt 187.2 lb

## 2019-02-02 DIAGNOSIS — E1052 Type 1 diabetes mellitus with diabetic peripheral angiopathy with gangrene: Secondary | ICD-10-CM | POA: Diagnosis not present

## 2019-02-02 DIAGNOSIS — L97329 Non-pressure chronic ulcer of left ankle with unspecified severity: Secondary | ICD-10-CM | POA: Diagnosis not present

## 2019-02-02 LAB — POCT GLYCOSYLATED HEMOGLOBIN (HGB A1C): Hemoglobin A1C: 6.2 % — AB (ref 4.0–5.6)

## 2019-02-02 MED ORDER — BASAGLAR KWIKPEN 100 UNIT/ML ~~LOC~~ SOPN: 6.0000 [IU] | PEN_INJECTOR | Freq: Every day | SUBCUTANEOUS | 11 refills | Status: DC

## 2019-02-02 MED ORDER — DEXCOM G6 SENSOR MISC: 1.0000 | 3 refills | Status: DC

## 2019-02-02 NOTE — Patient Instructions (Addendum)
Your blood pressure is high today.  Please see your primary care provider soon, to have it rechecked. Please see a wound specialist.  you will receive a phone call, about a day and time for an appointment. Keep the ulcer covered with antibiotic ointment and large bandaids, until you see the specialist.   Here are some places to get diabetic shoes from. Please reduce the Basaglar to 6 units per day, and continue the same humalog  check your blood sugar 4 times a day: before the 3 meals, and at bedtime.  also check if you have symptoms of your blood sugar being too high or too low.  please keep a record of the readings and bring it to your next appointment here (or you can bring the meter itself).  You can write it on any piece of paper.  please call us sooner if your blood sugar goes below 70, or if you have a lot of readings over 200.    Please come back for a follow-up appointment in 2 months.

## 2019-02-02 NOTE — Progress Notes (Signed)
Subjective:    Patient ID: Gordon Walker, male    DOB: Sep 21, 1965, 54 y.o.   MRN: [id number]  HPI Pt returns for f/u of diabetes mellitus:  DM type: 1 Dx'ed: 0998 Complications: polyneuropathy, PAD, bilat LE amputations, renal insuff, and DR.  Therapy: insulin since dx.  DKA: never.   Severe hypoglycemia: last episode was 2017.   Pancreatitis: never.   Other: he takes multiple daily injections; he declines pump rx; he uses dexcom G-6 continuous glucose monitor Interval history: I reviewed continuous glucose monitor data.  glucose varies from 50-400.  It is in general higher as the day goes on, but there is little trend throughout the day.  pt states he feels well in general, except for new ankle ulcer.  Past Medical History:  Diagnosis Date  . Complication of anesthesia    anesthesia complication 33/82/50; pt unaware what happened   . Dehiscence of amputation stump (HCC)    right  . Diabetes mellitus without complication (HCC)    Type 1  . Heart murmur    "years ago"  . Hypertension    "years ago"  . 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 AMPUTATION Left 01/07/2015   Procedure: Left Foot 1st Ray Amputation;  Surgeon: Newt Minion, MD;  Location: [employer];  Service: Orthopedics;  Laterality: Left;  . AMPUTATION Left 07/15/2015   Procedure: Left Transmetatarsal Amputation;  Surgeon: Newt Minion, MD;  Location: [place];  Service: Orthopedics;  Laterality: Left;  . AMPUTATION Right 12/30/2015   Procedure: 5th Ray Amputation Right Foot;  Surgeon: Newt Minion, MD;  Location: [place];  Service: Orthopedics;  Laterality: Right;  . AMPUTATION Right 01/15/2016   Procedure: GUILLOTINE AMPUTATION  ABOVE THE ANKLE AMPUTATION;  Surgeon: Marybelle Killings, MD;  Location: [place];  Service: Orthopedics;  Laterality: Right;  . AMPUTATION Right 01/18/2016   Procedure: AMPUTATION BELOW KNEE;  Surgeon: Marybelle Killings, MD;  Location: [place];  Service: Orthopedics;  Laterality: Right;  . STUMP REVISION Right 03/21/2016   Procedure: Right Below Knee Amputation Stump Revision, WOUND VAC application;  Surgeon: Marybelle Killings, MD;  Location: [place];  Service: Orthopedics;  Laterality: Right;    Social History   Socioeconomic History  . Marital status: Married    Spouse name: Not on file  . Number of children: Not on file  . Years of education: Not on file  . Highest education level: Not on file  Occupational History  . Not on file  Tobacco Use  . Smoking status: Never Smoker  . Smokeless tobacco: Never Used  Substance and Sexual Activity  . Alcohol use: No  . Drug use: No  . Sexual activity: Not on file  Other Topics Concern  . Not on file  Social History Narrative  . Not on file   Social Determinants of Health   Financial Resource Strain:   . Difficulty of Paying Living Expenses: Not on file  Food Insecurity:   . Worried About Charity fundraiser in the Last Year: Not on file  . Ran Out of Food in the Last Year: Not on file  Transportation Needs:   . Lack of Transportation (Medical): Not on file  . Lack of Transportation (Non-Medical): Not on file  Physical Activity:   . Days of Exercise per Week: Not on file  .  Minutes of Exercise per Session: Not on file  Stress:   . Feeling of Stress : Not on file  Social Connections:   . Frequency of Communication with Friends and Family: Not on file  . Frequency of Social Gatherings with Friends and Family: Not on file  . Attends Religious Services: Not on file  . Active Member of Clubs or Organizations: Not on file  . Attends Banker Meetings: Not on file  . Marital Status: Not on file  Intimate Partner Violence:   . Fear of Current or Ex-Partner: Not on file  . Emotionally Abused: Not on file  . Physically Abused: Not on file  . Sexually Abused: Not on file    Current  Outpatient Medications on File Prior to Visit  Medication Sig Dispense Refill  . aspirin EC 81 MG tablet Take 81 mg by mouth daily.    . insulin lispro (HUMALOG KWIKPEN) 100 UNIT/ML KwikPen Inject 0.09-0.11 mLs (9-11 Units total) into the skin 3 (three) times daily with meals. 15 mL 11  . Insulin Pen Needle 32G X 6 MM MISC 1 each by Does not apply route 4 (four) times daily. E10.52 200 each 2  . lisinopril (PRINIVIL,ZESTRIL) 10 MG tablet Take 1 tablet (10 mg total) by mouth daily. 30 tablet 0  . Multiple Vitamin (MULTIVITAMIN WITH MINERALS) TABS tablet Take 1 tablet by mouth daily.    . nitroGLYCERIN (NITRODUR - DOSED IN MG/24 HR) 0.2 mg/hr patch Place 1 patch (0.2 mg total) onto the skin daily. 30 patch 11  . pantoprazole (PROTONIX) 40 MG tablet Take 1 tablet (40 mg total) by mouth daily. 30 tablet 3   No current facility-administered medications on file prior to visit.    No Known Allergies  Family History  Problem Relation Age of Onset  . Hypertension Mother   . Diabetes Mother   . Cancer Mother   . Prostate cancer Father     BP (!) 176/98 (BP Location: Left Arm, Patient Position: Sitting, Cuff Size: Normal)   Pulse 95   Ht 6' (1.829 m)   Wt 187 lb 3.2 oz (84.9 kg)   SpO2 96%   BMI 25.39 kg/m    Review of Systems Denies LOC.      Objective:   Physical Exam VITAL SIGNS:  See vs page GENERAL: no distress Pulses: left dorsalis pedis intact.   MSK:  Left transmetatarsal amputation.  Old healed surgical scar.   Ext: right BKA Neuro: sensation is intact to touch on the left foot.   CV: no edema of the left leg.   SKIN: 3 cm ulcer at the right ant tibial area, jst proximal to the ankle.  No drainage/swell/erythema   Lab Results  Component Value Date   CREATININE 1.24 10/04/2016   BUN 22 (H) 10/04/2016   NA 136 10/04/2016   K 4.3 10/04/2016   CL 103 10/04/2016   CO2 24 10/04/2016   Lab Results  Component Value Date   HGBA1C 6.2 (A) 02/02/2019       Assessment  & Plan:  HTN: is noted today. Ankle ulcer, new. Type 1 DM, with renal insuff: overcontrolled.    Patient Instructions  Your blood pressure is high today.  Please see your primary care provider soon, to have it rechecked. Please see a wound specialist.  you will receive a phone call, about a day and time for an appointment. Keep the ulcer covered with antibiotic ointment and large bandaids, until you see the  specialist.   Here are some places to get diabetic shoes from. Please reduce the Basaglar to 6 units per day, and continue the same humalog  check your blood sugar 4 times a day: before the 3 meals, and at bedtime.  also check if you have symptoms of your blood sugar being too high or too low.  please keep a record of the readings and bring it to your next appointment here (or you can bring the meter itself).  You can write it on any piece of paper.  please call us sooner if your blood sugar goes below 70, or if you have a lot of readings over 200.    Please come back for a follow-up appointment in 2 months.

## 2019-02-16 ENCOUNTER — Encounter (HOSPITAL_BASED_OUTPATIENT_CLINIC_OR_DEPARTMENT_OTHER): Payer: BC Managed Care – PPO | Admitting: Internal Medicine

## 2019-02-17 ENCOUNTER — Encounter (HOSPITAL_BASED_OUTPATIENT_CLINIC_OR_DEPARTMENT_OTHER): Payer: BC Managed Care – PPO | Admitting: Internal Medicine

## 2019-03-30 ENCOUNTER — Other Ambulatory Visit: Payer: Self-pay | Admitting: Endocrinology

## 2019-04-02 ENCOUNTER — Other Ambulatory Visit: Payer: Self-pay

## 2019-04-06 ENCOUNTER — Encounter: Payer: Self-pay | Admitting: Endocrinology

## 2019-04-06 ENCOUNTER — Other Ambulatory Visit: Payer: Self-pay

## 2019-04-06 ENCOUNTER — Ambulatory Visit (INDEPENDENT_AMBULATORY_CARE_PROVIDER_SITE_OTHER): Payer: BC Managed Care – PPO | Admitting: Endocrinology

## 2019-04-06 VITALS — BP 132/70 | HR 92 | Ht 72.0 in | Wt 181.4 lb

## 2019-04-06 DIAGNOSIS — E1052 Type 1 diabetes mellitus with diabetic peripheral angiopathy with gangrene: Secondary | ICD-10-CM | POA: Diagnosis not present

## 2019-04-06 LAB — POCT GLYCOSYLATED HEMOGLOBIN (HGB A1C): Hemoglobin A1C: 6.1 % — AB (ref 4.0–5.6)

## 2019-04-06 MED ORDER — BASAGLAR KWIKPEN 100 UNIT/ML ~~LOC~~ SOPN: 3.0000 [IU] | PEN_INJECTOR | Freq: Every day | SUBCUTANEOUS | 11 refills | Status: DC

## 2019-04-06 NOTE — Progress Notes (Signed)
Subjective:    Patient ID: Gordon Walker, male    DOB: 11/17/1965, 54 y.o.   MRN: [id number]  HPI Pt returns for f/u of diabetes mellitus:  DM type: 1 Dx'ed: 1998 Complications: polyneuropathy, PAD, bilat LE amputations, renal insuff, and DR.  Therapy: insulin since dx.  DKA: never.   Severe hypoglycemia: last episode was 2017.   Pancreatitis: never.   Other: he takes multiple daily injections; he declines pump rx; he uses dexcom G-6 continuous glucose monitor Interval history: I reviewed continuous glucose monitor data.  glucose varies from 50-210.  It is in general higher as the day goes on.  pt states he feels well in general.  Past Medical History:  Diagnosis Date  . Complication of anesthesia    anesthesia complication 01/18/16; pt unaware what happened   . Dehiscence of amputation stump (HCC)    right  . Diabetes mellitus without complication (HCC)    Type 1  . Heart murmur    "years ago"  . Hypertension    "years ago"  . 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 AMPUTATION Left 01/07/2015   Procedure: Left Foot 1st Ray Amputation;  Surgeon: Nadara Mustard, MD;  Location: [facility] OR;  Service: Orthopedics;  Laterality: Left;  . AMPUTATION Left 07/15/2015   Procedure: Left Transmetatarsal Amputation;  Surgeon: Nadara Mustard, MD;  Location[facility];  Service: Orthopedics;  Laterality: Left;  . AMPUTATION Right 12/30/2015   Procedure: 5th Ray Amputation Right Foot;  Surgeon: Nadara Mustard, MD;  Location[facility] OR;  Service: Orthopedics;  Laterality: Right;  . AMPUTATION Right 01/15/2016   Procedure: GUILLOTINE AMPUTATION  ABOVE THE ANKLE AMPUTATION;  Surgeon: Eldred Manges, MD;  Location[facility];  Service: Orthopedics;  Laterality: Right;  . AMPUTATION Right 01/18/2016   Procedure: AMPUTATION BELOW KNEE;  Surgeon: Eldred Manges, MD;  Location[facility];   Service: Orthopedics;  Laterality: Right;  . STUMP REVISION Right 03/21/2016   Procedure: Right Below Knee Amputation Stump Revision, WOUND VAC application;  Surgeon: Eldred Manges, MD;  [facility];  Service: Orthopedics;  Laterality: Right;    Social History   Socioeconomic History  . Marital status: Married    Spouse name: Not on file  . Number of children: Not on file  . Years of education: Not on file  . Highest education level: Not on file  Occupational History  . Not on file  Tobacco Use  . Smoking status: Never Smoker  . Smokeless tobacco: Never Used  Substance and Sexual Activity  . Alcohol use: No  . Drug use: No  . Sexual activity: Not on file  Other Topics Concern  . Not on file  Social History Narrative  . Not on file   Social Determinants of Health   Financial Resource Strain:   . Difficulty of Paying Living Expenses:   Food Insecurity:   . Worried About Programme researcher, broadcasting/film/video in the Last Year:   . Barista in the Last Year:   Transportation Needs:   . Freight forwarder (Medical):   Marland Kitchen Lack of Transportation (Non-Medical):   Physical Activity:   . Days of Exercise per Week:   . Minutes of Exercise per Session:   Stress:   . Feeling of Stress :   Social Connections:   . Frequency  of Communication with Friends and Family:   . Frequency of Social Gatherings with Friends and Family:   . Attends Religious Services:   . Active Member of Clubs or Organizations:   . Attends Banker Meetings:   Marland Kitchen Marital Status:   Intimate Partner Violence:   . Fear of Current or Ex-Partner:   . Emotionally Abused:   Marland Kitchen Physically Abused:   . Sexually Abused:     Current Outpatient Medications on File Prior to Visit  Medication Sig Dispense Refill  . aspirin EC 81 MG tablet Take 81 mg by mouth daily.    . Continuous Blood Gluc Sensor (DEXCOM G6 SENSOR) MISC 1 Device by Does not apply route See admin instructions. Every 10 days 10 each 3  .  Continuous Blood Gluc Transmit (DEXCOM G6 TRANSMITTER) MISC USE AS DIRECTED 1 each 0  . insulin lispro (HUMALOG KWIKPEN) 100 UNIT/ML KwikPen Inject 0.09-0.11 mLs (9-11 Units total) into the skin 3 (three) times daily with meals. 15 mL 11  . Insulin Pen Needle 32G X 6 MM MISC 1 each by Does not apply route 4 (four) times daily. E10.52 200 each 2  . lisinopril (PRINIVIL,ZESTRIL) 10 MG tablet Take 1 tablet (10 mg total) by mouth daily. 30 tablet 0  . Multiple Vitamin (MULTIVITAMIN WITH MINERALS) TABS tablet Take 1 tablet by mouth daily.    . nitroGLYCERIN (NITRODUR - DOSED IN MG/24 HR) 0.2 mg/hr patch Place 1 patch (0.2 mg total) onto the skin daily. 30 patch 11  . pantoprazole (PROTONIX) 40 MG tablet Take 1 tablet (40 mg total) by mouth daily. 30 tablet 3   No current facility-administered medications on file prior to visit.    No Known Allergies  Family History  Problem Relation Age of Onset  . Hypertension Mother   . Diabetes Mother   . Cancer Mother   . Prostate cancer Father     BP 132/70   Pulse 92   Ht 6' (1.829 m)   Wt 181 lb 6.4 oz (82.3 kg)   SpO2 98%   BMI 24.60 kg/m    Review of Systems Denies LOC    Objective:   Physical Exam VITAL SIGNS:  See vs page GENERAL: no distress Pulses: left dorsalis pedis intact.   MSK:  Left transmetatarsal amputation.  Old healed surgical scar.   Ext: right BKA Neuro: sensation is intact to touch on the left foot.   CV: no edema of the left leg.    A1c=6.1%  Lab Results  Component Value Date   CREATININE 1.24 10/04/2016   BUN 22 (H) 10/04/2016   NA 136 10/04/2016   K 4.3 10/04/2016   CL 103 10/04/2016   CO2 24 10/04/2016       Assessment & Plan:  Type 1 DM: overcontrolled.  He may be entering remission.  However, this unlikely given duration of disease This limits rx options Renal insuff: this could also explain falling insulin requirement  Patient Instructions  please reduce the Basaglar to 3 units per day, and  continue the same humalog.  check your blood sugar 4 times a day: before the 3 meals, and at bedtime.  also check if you have symptoms of your blood sugar being too high or too low.  please keep a record of the readings and bring it to your next appointment here (or you can bring the meter itself).  You can write it on any piece of paper.  please call us sooner if  your blood sugar goes below 70, or if you have a lot of readings over 200.    Please come back for a follow-up appointment in 2 months.

## 2019-04-06 NOTE — Patient Instructions (Addendum)
please reduce the Basaglar to 3 units per day, and continue the same humalog.  check your blood sugar 4 times a day: before the 3 meals, and at bedtime.  also check if you have symptoms of your blood sugar being too high or too low.  please keep a record of the readings and bring it to your next appointment here (or you can bring the meter itself).  You can write it on any piece of paper.  please call us sooner if your blood sugar goes below 70, or if you have a lot of readings over 200.    Please come back for a follow-up appointment in 2 months.

## 2019-06-03 ENCOUNTER — Other Ambulatory Visit: Payer: Self-pay

## 2019-06-05 ENCOUNTER — Encounter: Payer: Self-pay | Admitting: Endocrinology

## 2019-06-05 ENCOUNTER — Other Ambulatory Visit: Payer: Self-pay

## 2019-06-05 ENCOUNTER — Ambulatory Visit (INDEPENDENT_AMBULATORY_CARE_PROVIDER_SITE_OTHER): Payer: BC Managed Care – PPO | Admitting: Endocrinology

## 2019-06-05 VITALS — BP 158/80 | HR 85 | Ht 72.0 in | Wt 178.0 lb

## 2019-06-05 DIAGNOSIS — E1052 Type 1 diabetes mellitus with diabetic peripheral angiopathy with gangrene: Secondary | ICD-10-CM

## 2019-06-05 LAB — POCT GLYCOSYLATED HEMOGLOBIN (HGB A1C): Hemoglobin A1C: 5.7 % — AB (ref 4.0–5.6)

## 2019-06-05 LAB — MICROALBUMIN, URINE: Microalb, Ur: 10.45

## 2019-06-05 MED ORDER — INSULIN PEN NEEDLE 32G X 6 MM MISC: 1.0000 | Freq: Three times a day (TID) | 3 refills | Status: DC

## 2019-06-05 MED ORDER — INSULIN LISPRO (1 UNIT DIAL) 100 UNIT/ML (KWIKPEN): 9.0000 [IU] | PEN_INJECTOR | Freq: Three times a day (TID) | SUBCUTANEOUS | 11 refills | Status: DC

## 2019-06-05 NOTE — Patient Instructions (Addendum)
Your blood pressure is high today.  Please see your primary care provider soon, to have it rechecked please go off the Basaglar, and continue the same humalog.  Keep the ulcer covered with antibiotic ointment and large bandaids, until it heals.   check your blood sugar 4 times a day: before the 3 meals, and at bedtime.  also check if you have symptoms of your blood sugar being too high or too low.  please keep a record of the readings and bring it to your next appointment here (or you can bring the meter itself).  You can write it on any piece of paper.  please call us sooner if your blood sugar goes below 70, or if you have a lot of readings over 200.    Please come back for a follow-up appointment in 2-3 months.

## 2019-06-05 NOTE — Progress Notes (Signed)
Subjective:    Patient ID: Gordon Gordon [name], male    DOB: [birth year], 54 y.o.   MRN: [id number]  HPI Pt returns for f/u of diabetes mellitus:  DM type: 1 Dx'ed: 1998 Complications: polyneuropathy, PAD, bilat LE amputations, renal insuff, and DR.  Therapy: insulin since dx.  DKA: never.   Severe hypoglycemia: last episode was 2017.   Pancreatitis: never.   Other: he takes multiple daily injections; he declines pump rx; he uses dexcom G-6 continuous glucose monitor.   Interval history: I reviewed continuous glucose monitor data.  glucose varies from 50-220.  It is in general higher as the day goes on, but not necessarily so.  pt states he feels well in general.   Past Medical History:  Diagnosis Date  . Complication of anesthesia    anesthesia complication 01/18/16; pt unaware what happened   . Dehiscence of amputation stump (HCC)    right  . Diabetes mellitus without complication (HCC)    Type 1  . Heart murmur    "years ago"  . Hypertension    "years ago"  . 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 AMPUTATION Left 01/07/2015   Procedure: Left Foot 1st Ray Amputation;  Surgeon: Nadara Mustard, MD;  Location: [facility] OR;  Service: Orthopedics;  Laterality: Left;  . AMPUTATION Left 07/15/2015   Procedure: Left Transmetatarsal Amputation;  Surgeon: Nadara Mustard, MD;  [facility];  Service: Orthopedics;  Laterality: Left;  . AMPUTATION Right 12/30/2015   Procedure: 5th Ray Amputation Right Foot;  Surgeon: Nadara Mustard, MD;  Location: [facility] OR;  Service: Orthopedics;  Laterality: Right;  . AMPUTATION Right 01/15/2016   Procedure: GUILLOTINE AMPUTATION  ABOVE THE ANKLE AMPUTATION;  Surgeon: Eldred Manges, MD;  Location[facility];  Service: Orthopedics;  Laterality: Right;  . AMPUTATION Right 01/18/2016   Procedure: AMPUTATION BELOW KNEE;  Surgeon: Eldred Manges, MD;  Location: [facility] OR;  Service: Orthopedics;  Laterality: Right;  . STUMP REVISION Right 03/21/2016   Procedure: Right Below Knee Amputation Stump Revision, WOUND VAC application;  Surgeon: Eldred Manges, MD;  Location[facility];  Service: Orthopedics;  Laterality: Right;    Social History   Socioeconomic History  . Marital status: Married    Spouse name: Not on file  . Number of children: Not on file  . Years of education: Not on file  . Highest education level: Not on file  Occupational History  . Not on file  Tobacco Use  . Smoking status: Never Smoker  . Smokeless tobacco: Never Used  Substance and Sexual Activity  . Alcohol use: No  . Drug use: No  . Sexual activity: Not on file  Other Topics Concern  . Not on file  Social History Narrative  . Not on file   Social Determinants of Health   Financial Resource Strain:   . Difficulty of Paying Living Expenses:   Food Insecurity:   . Worried About Programme researcher, broadcasting/film/video in the Last Year:   . Barista in the Last Year:   Transportation Needs:   . Freight forwarder (Medical):   Marland Kitchen Lack of Transportation (Non-Medical):   Physical Activity:   . Days of Exercise per Week:   . Minutes of Exercise per Session:   Stress:   . Feeling of Stress :  Social Connections:   . Frequency of Communication with Friends and Family:   . Frequency of Social Gatherings with Friends and Family:   . Attends Religious Services:   . Active Member of Clubs or Organizations:   . Attends Archivist Meetings:   Marland Kitchen Marital Status:   Intimate Partner Violence:   . Fear of Current or Ex-Partner:   . Emotionally Abused:   Marland Kitchen Physically Abused:   . Sexually Abused:     Current Outpatient Medications on File Prior to Visit  Medication Sig Dispense Refill  . aspirin EC 81 MG tablet Take 81 mg by mouth daily.    . Continuous Blood Gluc Sensor (DEXCOM G6 SENSOR) MISC 1 Device by Does not apply route See admin instructions. Every  10 days 10 each 3  . Continuous Blood Gluc Transmit (DEXCOM G6 TRANSMITTER) MISC USE AS DIRECTED 1 each 0  . lisinopril (PRINIVIL,ZESTRIL) 10 MG tablet Take 1 tablet (10 mg total) by mouth daily. 30 tablet 0  . Multiple Vitamin (MULTIVITAMIN WITH MINERALS) TABS tablet Take 1 tablet by mouth daily.    . nitroGLYCERIN (NITRODUR - DOSED IN MG/24 HR) 0.2 mg/hr patch Place 1 patch (0.2 mg total) onto the skin daily. 30 patch 11  . pantoprazole (PROTONIX) 40 MG tablet Take 1 tablet (40 mg total) by mouth daily. 30 tablet 3   No current facility-administered medications on file prior to visit.    No Known Allergies  Family History  Problem Relation Age of Onset  . Hypertension Mother   . Diabetes Mother   . Cancer Mother   . Prostate cancer Father     BP (!) 158/80   Pulse 85   Ht 6' (1.829 m)   Wt 178 lb (80.7 kg)   SpO2 98%   BMI 24.14 kg/m    Review of Systems Denies LOC.  Pt says he has had an ulcer at the left foot.  He says it is improving with local care.      Objective:   Physical Exam VITAL SIGNS:  See vs page GENERAL: no distress Pulses: left dorsalis pedis intact.   MSK:  Left transmetatarsal amputation.  There is a 4x2 cm shallow clean ulcer at the dorsal aspect of the left foot.  No drainage Ext: right BKA Neuro: sensation is intact to touch on the left foot.   CV: no edema of the left leg.    Lab Results  Component Value Date   CREATININE 1.24 10/04/2016   BUN 22 (H) 10/04/2016   NA 136 10/04/2016   K 4.3 10/04/2016   CL 103 10/04/2016   CO2 24 10/04/2016   Lab Results  Component Value Date   HGBA1C 5.7 (A) 06/05/2019        Assessment & Plan:  Type 1 DM: overcontrolled.  He is entering remission. CRI: this is not severe enough to explain decreasing insulin need. Foot ulcer, new.  He declines ref wound care HTN: is noted today  Patient Instructions  Your blood pressure is high today.  Please see your primary care provider soon, to have it  rechecked please go off the [place], and continue the same humalog.  Keep the ulcer covered with antibiotic ointment and large bandaids, until it heals.   check your blood sugar 4 times a day: before the 3 meals, and at bedtime.  also check if you have symptoms of your blood sugar being too high or too low.  please keep a record of  the readings and bring it to your next appointment here (or you can bring the meter itself).  You can write it on any piece of paper.  please call us sooner if your blood sugar goes below 70, or if you have a lot of readings over 200.    Please come back for a follow-up appointment in 2-3 months.

## 2019-06-15 ENCOUNTER — Other Ambulatory Visit: Payer: Self-pay | Admitting: Endocrinology

## 2019-06-16 ENCOUNTER — Other Ambulatory Visit: Payer: Self-pay

## 2019-06-16 ENCOUNTER — Telehealth: Payer: Self-pay | Admitting: Endocrinology

## 2019-06-16 DIAGNOSIS — E1052 Type 1 diabetes mellitus with diabetic peripheral angiopathy with gangrene: Secondary | ICD-10-CM

## 2019-06-16 MED ORDER — BASAGLAR KWIKPEN 100 UNIT/ML ~~LOC~~ SOPN: 3.0000 [IU] | PEN_INJECTOR | Freq: Every day | SUBCUTANEOUS | 0 refills | Status: DC

## 2019-06-16 MED ORDER — INSULIN LISPRO (1 UNIT DIAL) 100 UNIT/ML (KWIKPEN): 8.0000 [IU] | PEN_INJECTOR | Freq: Three times a day (TID) | SUBCUTANEOUS | 11 refills | Status: DC

## 2019-06-16 NOTE — Telephone Encounter (Signed)
Patient requests to be called at ph# [phone] re: Patient was taken off Basaglar at last appointment. Patient states his blood sugars are going up because he stopped taking Basaglar and requests to start taking Basaglar again. No need to call patient if RX for Basaglar is sent as follows:  Medication RX/Refill Request  Did you call your pharmacy and request this refill first? No . If patient has not contacted pharmacy first, instruct them to do so for future refills.  . Remind them that contacting the pharmacy for their refill is the quickest method to get the refill.  . Refill policy also stated that it will take anywhere between 24-72 hours to receive the refill.    Name of medication? Basaglar  Is this a 90 day supply? Yes  Name and location of pharmacy?  [facility] [address], [place] - [address] Phone:  [phone]  Fax:  [phone]       . Is the request for diabetes test strips? No . If yes, what brand? N/A

## 2019-06-16 NOTE — Telephone Encounter (Signed)
Called pt and informed of new orders. Also advised Rx for Mariella Saa has been sent to [facility]. Verbalized acceptance and understanding.  Outpatient Medication Detail   Disp Refills Start End   Insulin Glargine (BASAGLAR KWIKPEN) 100 UNIT/ML 3 mL 0 06/16/2019    Sig - Route: Inject 0.03 mLs (3 Units total) into the skin daily. - Subcutaneous   Sent to pharmacy as: Insulin Glargine (BASAGLAR KWIKPEN) 100 UNIT/ML   E-Prescribing Status: Receipt confirmed by pharmacy (06/16/2019 10:06 AM EDT)

## 2019-06-16 NOTE — Telephone Encounter (Signed)
OK, please resume the basaglar at 3 units qd.  Please reduce the humalog to 8-10 units 3 times a day (just before each meal) I'll see you next time.

## 2019-06-16 NOTE — Telephone Encounter (Signed)
From 06/05/19 office note: please go off the Basaglar, and continue the same humalog.   Please refer below

## 2019-07-20 ENCOUNTER — Other Ambulatory Visit: Payer: Self-pay | Admitting: Endocrinology

## 2019-09-07 ENCOUNTER — Ambulatory Visit (INDEPENDENT_AMBULATORY_CARE_PROVIDER_SITE_OTHER): Payer: BC Managed Care – PPO | Admitting: Endocrinology

## 2019-09-07 ENCOUNTER — Other Ambulatory Visit: Payer: Self-pay

## 2019-09-07 ENCOUNTER — Encounter: Payer: Self-pay | Admitting: Endocrinology

## 2019-09-07 VITALS — BP 142/80 | HR 75 | Ht 72.0 in | Wt 185.0 lb

## 2019-09-07 DIAGNOSIS — E1052 Type 1 diabetes mellitus with diabetic peripheral angiopathy with gangrene: Secondary | ICD-10-CM

## 2019-09-07 LAB — POCT GLYCOSYLATED HEMOGLOBIN (HGB A1C): Hemoglobin A1C: 7.2 % — AB (ref 4.0–5.6)

## 2019-09-07 MED ORDER — INSULIN LISPRO (1 UNIT DIAL) 100 UNIT/ML (KWIKPEN): PEN_INJECTOR | SUBCUTANEOUS | 11 refills | Status: DC

## 2019-09-07 MED ORDER — BASAGLAR KWIKPEN 100 UNIT/ML ~~LOC~~ SOPN: 2.0000 [IU] | PEN_INJECTOR | Freq: Every day | SUBCUTANEOUS | 0 refills | Status: DC

## 2019-09-07 NOTE — Progress Notes (Signed)
Subjective:    Patient ID: Gordon Walker, male    DOB: 08-Aug-1965, 54 y.o.   MRN: [id number]  HPI Pt returns for f/u of diabetes mellitus:  DM type: 1 Dx'ed: 1998 Complications: polyneuropathy, PAD, bilat LE amputations, renal insuff, and DR.  Therapy: insulin since dx.  DKA: never.   Severe hypoglycemia: last episode was 2017.   Pancreatitis: never.   Other: he takes multiple daily injections; he declines pump rx; he uses dexcom G-6 continuous glucose monitor.   Interval history: I reviewed continuous glucose monitor data.  glucose varies from 50-260.  It is in general lowest after lunch, and highest after breakfast and super.  Glucose varies widely, even in the middle of the night. pt states he feels well in general.  Past Medical History:  Diagnosis Date  . Complication of anesthesia    anesthesia complication 01/18/16; pt unaware what happened   . Dehiscence of amputation stump (HCC)    right  . Diabetes mellitus without complication (HCC)    Type 1  . Heart murmur    "years ago"  . Hypertension    "years ago"  . 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, with long-term current use of insulin [facility])     Past Surgical History:  Procedure Laterality Date  . AMPUTATION Left 01/07/2015   Procedure: Left Foot 1st Ray Amputation;  Surgeon: Nadara Mustard, MD;  Location: [facility] OR;  Service: Orthopedics;  Laterality: Left;  . AMPUTATION Left 07/15/2015   Procedure: Left Transmetatarsal Amputation;  Surgeon: Nadara Mustard, MD;  Location: [facility] OR;  Service: Orthopedics;  Laterality: Left;  . AMPUTATION Right 12/30/2015   Procedure: 5th Ray Amputation Right Foot;  Surgeon: Nadara Mustard, MD;  Location: [facility] OR;  Service: Orthopedics;  Laterality: Right;  . AMPUTATION Right 01/15/2016   Procedure: GUILLOTINE AMPUTATION  ABOVE THE ANKLE AMPUTATION;  Surgeon: Eldred Manges, MD;  Location: [facility] OR;  Service: Orthopedics;  Laterality: Right;  . AMPUTATION Right  01/18/2016   Procedure: AMPUTATION BELOW KNEE;  Surgeon: Eldred Manges, MD;  Location: [facility] OR;  Service: Orthopedics;  Laterality: Right;  . STUMP REVISION Right 03/21/2016   Procedure: Right Below Knee Amputation Stump Revision, WOUND VAC application;  Surgeon: Eldred Manges, MD;  Location: [facility] OR;  Service: Orthopedics;  Laterality: Right;    Social History   Socioeconomic History  . Marital status: Married    Spouse name: Not on file  . Number of children: Not on file  . Years of education: Not on file  . Highest education level: Not on file  Occupational History  . Not on file  Tobacco Use  . Smoking status: Never Smoker  . Smokeless tobacco: Never Used  Substance and Sexual Activity  . Alcohol use: No  . Drug use: No  . Sexual activity: Not on file  Other Topics Concern  . Not on file  Social History Narrative  . Not on file   Social Determinants of Health   Financial Resource Strain:   . Difficulty of Paying Living Expenses:   Food Insecurity:   . Worried About Programme researcher, broadcasting/film/video in the Last Year:   . Barista in the Last Year:   Transportation Needs:   . Freight forwarder (Medical):   Marland Kitchen Lack of Transportation (Non-Medical):   Physical Activity:   . Days of Exercise per Week:   . Minutes of Exercise per Session:   Stress:   .  Feeling of Stress :   Social Connections:   . Frequency of Communication with Friends and Family:   . Frequency of Social Gatherings with Friends and Family:   . Attends Religious Services:   . Active Member of Clubs or Organizations:   . Attends Banker Meetings:   Marland Kitchen Marital Status:   Intimate Partner Violence:   . Fear of Current or Ex-Partner:   . Emotionally Abused:   Marland Kitchen Physically Abused:   . Sexually Abused:     Current Outpatient Medications on File Prior to Visit  Medication Sig Dispense Refill  . aspirin EC 81 MG tablet Take 81 mg by mouth daily.    . Continuous Blood Gluc Sensor (DEXCOM G6 SENSOR)  MISC 1 Device by Does not apply route See admin instructions. Every 10 days 10 each 3  . Continuous Blood Gluc Transmit (DEXCOM G6 TRANSMITTER) MISC USE AS DIRECTED 1 each 0  . Insulin Pen Needle 32G X 6 MM MISC 1 each by Does not apply route in the morning, at noon, and at bedtime. E10.52 270 each 3  . lisinopril (PRINIVIL,ZESTRIL) 10 MG tablet Take 1 tablet (10 mg total) by mouth daily. 30 tablet 0  . Multiple Vitamin (MULTIVITAMIN WITH MINERALS) TABS tablet Take 1 tablet by mouth daily.    . nitroGLYCERIN (NITRODUR - DOSED IN MG/24 HR) 0.2 mg/hr patch Place 1 patch (0.2 mg total) onto the skin daily. 30 patch 11  . pantoprazole (PROTONIX) 40 MG tablet Take 1 tablet (40 mg total) by mouth daily. 30 tablet 3   No current facility-administered medications on file prior to visit.    No Known Allergies  Family History  Problem Relation Age of Onset  . Hypertension Mother   . Diabetes Mother   . Cancer Mother   . Prostate cancer Father     BP (!) 142/80   Pulse 75   Ht 6' (1.829 m)   Wt 185 lb (83.9 kg)   SpO2 98%   BMI 25.09 kg/m    Review of Systems Denies LOc    Objective:   Physical Exam VITAL SIGNS:  See vs page GENERAL: no distress Pulses: left dorsalis pedis intact.   MSK:  Left transmetatarsal amputation.  Left foot ulcer is healing well.  Ext: right BKA Neuro: sensation is intact to touch on the left foot.   CV: no edema of the left leg.  Lab Results  Component Value Date   HGBA1C 7.2 (A) 09/07/2019       Assessment & Plan:  HTN: is noted today.  Type 1 DM, with PAD: The pattern of his cbg's indicates he needs some adjustment in his therapy.  Hypoglycemia, due to insulin: this limits aggressiveness of glycemic control.    Patient Instructions  Your blood pressure is high today.  Please see your primary care provider soon, to have it rechecked please reduce the Basaglar to 2 units daily, and change the humalog to 10-12 units with breakfast, 6-8 units with  lunch, and 10-12 units with supper.   Keep the ulcer covered with antibiotic ointment and large bandaids, until it completely heals.   check your blood sugar 4 times a day: before the 3 meals, and at bedtime.  also check if you have symptoms of your blood sugar being too high or too low.  please keep a record of the readings and bring it to your next appointment here (or you can bring the meter itself).  You can write  it on any piece of paper.  please call us sooner if your blood sugar goes below 70, or if you have a lot of readings over 200.   Please come back for a follow-up appointment in 2-3 months.

## 2019-09-07 NOTE — Patient Instructions (Addendum)
Your blood pressure is high today.  Please see your primary care provider soon, to have it rechecked please reduce the Basaglar to 2 units daily, and change the humalog to 10-12 units with breakfast, 6-8 units with lunch, and 10-12 units with supper.   Keep the ulcer covered with antibiotic ointment and large bandaids, until it completely heals.   check your blood sugar 4 times a day: before the 3 meals, and at bedtime.  also check if you have symptoms of your blood sugar being too high or too low.  please keep a record of the readings and bring it to your next appointment here (or you can bring the meter itself).  You can write it on any piece of paper.  please call us sooner if your blood sugar goes below 70, or if you have a lot of readings over 200.   Please come back for a follow-up appointment in 2-3 months.

## 2019-09-08 ENCOUNTER — Ambulatory Visit (INDEPENDENT_AMBULATORY_CARE_PROVIDER_SITE_OTHER): Payer: BC Managed Care – PPO | Admitting: Orthopaedic Surgery

## 2019-09-08 ENCOUNTER — Encounter: Payer: Self-pay | Admitting: Orthopaedic Surgery

## 2019-09-08 VITALS — Ht 72.0 in | Wt 184.0 lb

## 2019-09-08 DIAGNOSIS — Z89511 Acquired absence of right leg below knee: Secondary | ICD-10-CM

## 2019-09-08 NOTE — Progress Notes (Signed)
Office Visit Note   Patient: Gordon Walker           Date of Birth: May 30, 1965           MRN: [id number] Visit Date: 09/08/2019              Requested by: Darrow Bussing, MD [address] [place],  [place] [address] PCP: Darrow Bussing, MD   Assessment & Plan: Visit Diagnoses: No diagnosis found.  Plan: Prescription given for new prosthetic foot for his below-knee amputation on the right.  He can follow-up as needed.  Follow-Up Instructions: Return if symptoms worsen or fail to improve.   Orders:  No orders of the defined types were placed in this encounter.  No orders of the defined types were placed in this encounter.     Procedures: No procedures performed   Clinical Data: No additional findings.   Subjective: Chief Complaint  Patient presents with  . Right Leg - Follow-up    Needs rx for new prosthetic foot    HPI 54 year old male diabetic post guillotine amputation for diabetic foot infection 2017 after initial ray amputation by Dr. Lajoyce Corners early December 2017.  He later underwent stump revision 2018 and since that time is been doing well with good diabetic control.  His A1c is been running in the sixes 3 months ago was 5.7 yesterday with the highest is been in a while at 7.2.  He is here since the foot prosthesis is broken for his below-knee amputation he needs a new prescription for new foot.  Patient has endoskeleton carbon fiber which is broken and separated.  The prosthetist put him on a loaner foot.  Review of Systems reviewed updated insulin-dependent diabetic.   Objective: Vital Signs: Ht 6' (1.829 m)   Wt 184 lb (83.5 kg)   BMI 24.95 kg/m   Physical Exam Constitutional:      Appearance: He is well-developed.  HENT:     Head: Normocephalic and atraumatic.  Eyes:     Pupils: Pupils are equal, round, and reactive to light.  Neck:     Thyroid: No thyromegaly.     Trachea: No tracheal deviation.  Cardiovascular:     Rate and  Rhythm: Normal rate.  Pulmonary:     Effort: Pulmonary effort is normal.     Breath sounds: No wheezing.  Abdominal:     General: Bowel sounds are normal.     Palpations: Abdomen is soft.  Skin:    General: Skin is warm and dry.     Capillary Refill: Capillary refill takes less than 2 seconds.  Neurological:     Mental Status: He is alert and oriented to person, place, and time.  Psychiatric:        Behavior: Behavior normal.        Thought Content: Thought content normal.        Judgment: Judgment normal.     Ortho Exam patient is ambulatory.  No skin breakdown.  Specialty Comments:  No specialty comments available.  Imaging: No results found.   PMFS History: Patient Active Problem List   Diagnosis Date Noted  . Ankle ulcer, left, with unspecified severity (HCC) 02/02/2019  . Hypertension 10/03/2016  . RUQ pain 10/03/2016  . Elevated liver function tests   . Intractable vomiting with nausea   . Sludge in gallbladder   . Wound dehiscence, surgical   . Acute blood loss anemia 01/20/2016  . Status post below knee amputation of right  lower extremity (HCC) 01/20/2016  . Hyponatremia 01/17/2016  . AKI (acute kidney injury) (HCC) 01/17/2016  . Hyperbilirubinemia 01/17/2016  . Bacteremia due to Escherichia coli 01/17/2016  . Sepsis (HCC) 01/17/2016  . DKA (diabetic ketoacidoses) (HCC) 01/15/2016  . Controlled diabetes mellitus with diabetic peripheral angiopathy and gangrene (HCC)   . 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 Transaminitis 01/07/2015  . Osteomyelitis of left foot (HCC) 01/06/2015  . Uncontrolled diabetes mellitus with foot ulcer, with long-term current use of insulin (HCC) 01/06/2015  . Anemia 01/06/2015  . Osteomyelitis (HCC) 01/05/2015   Past Medical History:  Diagnosis Date  . Complication of anesthesia    anesthesia complication 01/18/16; pt unaware what happened     . Dehiscence of amputation stump (HCC)    right  . Diabetes mellitus without complication (HCC)    Type 1  . Heart murmur    "years ago"  . Hypertension    "years ago"  . 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, with long-term current use of insulin (HCC)     Family History  Problem Relation Age of Onset  . Hypertension Mother   . Diabetes Mother   . Cancer Mother   . Prostate cancer Father     Past Surgical History:  Procedure Laterality Date  . AMPUTATION Left 01/07/2015   Procedure: Left Foot 1st Ray Amputation;  Surgeon: Nadara Mustard, MD;  Location: [facility] OR;  Service: Orthopedics;  Laterality: Left;  . AMPUTATION Left 07/15/2015   Procedure: Left Transmetatarsal Amputation;  Surgeon: Nadara Mustard, MD;  Location: [facility] OR;  Service: Orthopedics;  Laterality: Left;  . AMPUTATION Right 12/30/2015   Procedure: 5th Ray Amputation Right Foot;  Surgeon: Nadara Mustard, MD;  Location: [facility] OR;  Service: Orthopedics;  Laterality: Right;  . AMPUTATION Right 01/15/2016   Procedure: GUILLOTINE AMPUTATION  ABOVE THE ANKLE AMPUTATION;  Surgeon: Eldred Manges, MD;  Location: [facility] OR;  Service: Orthopedics;  Laterality: Right;  . AMPUTATION Right 01/18/2016   Procedure: AMPUTATION BELOW KNEE;  Surgeon: Eldred Manges, MD;  Location: [facility] OR;  Service: Orthopedics;  Laterality: Right;  . STUMP REVISION Right 03/21/2016   Procedure: Right Below Knee Amputation Stump Revision, WOUND VAC application;  Surgeon: Eldred Manges, MD;  Location: [facility] OR;  Service: Orthopedics;  Laterality: Right;   Social History   Occupational History  . Not on file  Tobacco Use  . Smoking status: Never Smoker  . Smokeless tobacco: Never Used  Substance and Sexual Activity  . Alcohol use: No  . Drug use: No  . Sexual activity: Not on file

## 2019-09-15 LAB — HM DIABETES EYE EXAM

## 2019-09-21 ENCOUNTER — Other Ambulatory Visit: Payer: Self-pay | Admitting: Endocrinology

## 2019-09-21 DIAGNOSIS — E1052 Type 1 diabetes mellitus with diabetic peripheral angiopathy with gangrene: Secondary | ICD-10-CM

## 2019-09-22 ENCOUNTER — Other Ambulatory Visit: Payer: Self-pay

## 2019-09-22 DIAGNOSIS — E1052 Type 1 diabetes mellitus with diabetic peripheral angiopathy with gangrene: Secondary | ICD-10-CM

## 2019-09-22 MED ORDER — INSULIN LISPRO (1 UNIT DIAL) 100 UNIT/ML (KWIKPEN): PEN_INJECTOR | SUBCUTANEOUS | 11 refills | Status: DC

## 2019-09-23 ENCOUNTER — Telehealth: Payer: Self-pay | Admitting: Endocrinology

## 2019-09-23 ENCOUNTER — Other Ambulatory Visit: Payer: Self-pay

## 2019-09-23 DIAGNOSIS — E1052 Type 1 diabetes mellitus with diabetic peripheral angiopathy with gangrene: Secondary | ICD-10-CM

## 2019-09-23 MED ORDER — NOVOLOG FLEXPEN 100 UNIT/ML ~~LOC~~ SOPN: PEN_INJECTOR | SUBCUTANEOUS | 2 refills | Status: DC

## 2019-09-23 NOTE — Telephone Encounter (Signed)
Please advise if you want PA completed or change to alternative

## 2019-09-23 NOTE — Telephone Encounter (Signed)
Patient called stating his medication for Humalog needed a PA done and he wanted give [place] a phone number to call to do the PA - ph# [phone] and ID# [id number]

## 2019-09-23 NOTE — Telephone Encounter (Signed)
Outpatient Medication Detail   Disp Refills Start End   insulin aspart (NOVOLOG FLEXPEN) 100 UNIT/ML FlexPen 10 mL 2 09/23/2019    Sig: 10-12 units with breakfast, 6-8 units with lunch, and 10-12 units with supper.   Sent to pharmacy as: insulin aspart (NOVOLOG FLEXPEN) 100 UNIT/ML FlexPen   Notes to Pharmacy: PA WILL NOT BE COMPLETED FOR HUMALOG KWIKPEN.   E-Prescribing Status: Receipt confirmed by pharmacy (09/23/2019 12:10 PM EDT)

## 2019-09-23 NOTE — Telephone Encounter (Signed)
novolog is fine.  Same dosage

## 2019-09-29 ENCOUNTER — Other Ambulatory Visit: Payer: Self-pay | Admitting: Endocrinology

## 2019-09-29 MED ORDER — DEXCOM G6 TRANSMITTER MISC
3 refills | Status: DC
Start: 2019-09-29 — End: 2020-08-23

## 2019-09-30 ENCOUNTER — Telehealth: Payer: Self-pay

## 2019-09-30 NOTE — Telephone Encounter (Signed)
Talked with Gordon Walker and advised her that forms will be faxed back today per Dorcas Mcmurray.

## 2019-09-30 NOTE — Telephone Encounter (Signed)
Michelle from [facility] called for forms that were faxed over she hasn't heard anything back about the forms and she needs them tomorrow. Call back:[phone]

## 2019-12-08 ENCOUNTER — Other Ambulatory Visit: Payer: Self-pay

## 2019-12-08 ENCOUNTER — Encounter: Payer: Self-pay | Admitting: Endocrinology

## 2019-12-08 ENCOUNTER — Ambulatory Visit (INDEPENDENT_AMBULATORY_CARE_PROVIDER_SITE_OTHER): Payer: Self-pay | Admitting: Endocrinology

## 2019-12-08 VITALS — BP 148/82 | HR 79 | Ht 72.0 in | Wt 188.0 lb

## 2019-12-08 DIAGNOSIS — E1052 Type 1 diabetes mellitus with diabetic peripheral angiopathy with gangrene: Secondary | ICD-10-CM

## 2019-12-08 LAB — POCT GLYCOSYLATED HEMOGLOBIN (HGB A1C): Hemoglobin A1C: 6.6 % — AB (ref 4.0–5.6)

## 2019-12-08 MED ORDER — NOVOLOG FLEXPEN 100 UNIT/ML ~~LOC~~ SOPN: PEN_INJECTOR | SUBCUTANEOUS | 2 refills | Status: DC

## 2019-12-08 NOTE — Patient Instructions (Addendum)
Your blood pressure is high today.  Please see your primary care provider soon, to have it rechecked please reduce the Basaglar to 2 units daily, and change the humalog to 10-12 units with breakfast, 5-7 units with lunch, and 10-12 units with supper.   check your blood sugar 4 times a day: before the 3 meals, and at bedtime.  also check if you have symptoms of your blood sugar being too high or too low.  please keep a record of the readings and bring it to your next appointment here (or you can bring the meter itself).  You can write it on any piece of paper.  please call us sooner if your blood sugar goes below 70, or if you have a lot of readings over 200.   Please come back for a follow-up appointment in 2-3 months.

## 2019-12-08 NOTE — Progress Notes (Signed)
Subjective:    Patient ID: Gordon Walker, male    DOB: [birth year], 54 y.o.   MRN: [id number]  HPI Pt returns for f/u of diabetes mellitus:  DM type: 1 Dx'ed: 1998 Complications: PN, PAD, bilat LE amputations, CRI, and DR.  Therapy: insulin since dx.  DKA: never.   Severe hypoglycemia: last episode was 2017.   Pancreatitis: never.   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   Interval history: I reviewed continuous glucose monitor data.  glucose varies from 60-270.  It is in general lowest after lunch, and highest after breakfast and super.  Glucose varies widely, even in the middle of the night. It decreases 2AM-6AM (pt says he takes no insulin then).  pt states he feels well in general.  Past Medical History:  Diagnosis Date  . Complication of anesthesia    anesthesia complication 01/18/16; pt unaware what happened   . Dehiscence of amputation stump (HCC)    right  . Diabetes mellitus without complication (HCC)    Type 1  . Heart murmur    "years ago"  . Hypertension    "years ago"  . 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, with long-term current use of insulin [facility])     Past Surgical History:  Procedure Laterality Date  . AMPUTATION Left 01/07/2015   Procedure: Left Foot 1st Ray Amputation;  Surgeon: Nadara Mustard, MD;  Location: [facility] OR;  Service: Orthopedics;  Laterality: Left;  . AMPUTATION Left 07/15/2015   Procedure: Left Transmetatarsal Amputation;  Surgeon: Nadara Mustard, MD;  Location: [facility] OR;  Service: Orthopedics;  Laterality: Left;  . AMPUTATION Right 12/30/2015   Procedure: 5th Ray Amputation Right Foot;  Surgeon: Nadara Mustard, MD;  Location: [facility] OR;  Service: Orthopedics;  Laterality: Right;  . AMPUTATION Right 01/15/2016   Procedure: GUILLOTINE AMPUTATION  ABOVE THE ANKLE AMPUTATION;  Surgeon: Eldred Manges, MD;  Location:  [facility] OR;  Service: Orthopedics;  Laterality: Right;  . AMPUTATION Right 01/18/2016   Procedure: AMPUTATION BELOW KNEE;  Surgeon: Eldred Manges, MD;  Location: [facility] OR;  Service: Orthopedics;  Laterality: Right;  . STUMP REVISION Right 03/21/2016   Procedure: Right Below Knee Amputation Stump Revision, WOUND VAC application;  Surgeon: Eldred Manges, MD;  Location: [facility] OR;  Service: Orthopedics;  Laterality: Right;    Social History   Socioeconomic History  . Marital status: Married    Spouse name: Not on file  . Number of children: Not on file  . Years of education: Not on file  . Highest education level: Not on file  Occupational History  . Not on file  Tobacco Use  . Smoking status: Never Smoker  . Smokeless tobacco: Never Used  Substance and Sexual Activity  . Alcohol use: No  . Drug use: No  . Sexual activity: Not on file  Other Topics Concern  . Not on file  Social History Narrative  . Not on file   Social Determinants of Health   Financial Resource Strain:   . Difficulty of Paying Living Expenses: Not on file  Food Insecurity:   . Worried About Programme researcher, broadcasting/film/video in the Last Year: Not on file  . Ran Out of Food in the Last Year: Not on file  Transportation Needs:   . Lack of Transportation (Medical): Not on file  . Lack of Transportation (  Non-Medical): Not on file  Physical Activity:   . Days of Exercise per Week: Not on file  . Minutes of Exercise per Session: Not on file  Stress:   . Feeling of Stress : Not on file  Social Connections:   . Frequency of Communication with Friends and Family: Not on file  . Frequency of Social Gatherings with Friends and Family: Not on file  . Attends Religious Services: Not on file  . Active Member of Clubs or Organizations: Not on file  . Attends Banker Meetings: Not on file  . Marital Status: Not on file  Intimate Partner Violence:   . Fear of Current or Ex-Partner: Not on file  . Emotionally Abused: Not on file  .  Physically Abused: Not on file  . Sexually Abused: Not on file    Current Outpatient Medications on File Prior to Visit  Medication Sig Dispense Refill  . aspirin EC 81 MG tablet Take 81 mg by mouth daily.    . Continuous Blood Gluc Sensor (DEXCOM G6 SENSOR) MISC 1 Device by Does not apply route See admin instructions. Every 10 days 10 each 3  . Continuous Blood Gluc Transmit (DEXCOM G6 TRANSMITTER) MISC Use one transmitter once every 90 days to transmit blood sugars. 1 each 3  . Insulin Glargine (BASAGLAR KWIKPEN) 100 UNIT/ML Inject 0.02 mLs (2 Units total) into the skin daily. 3 mL 0  . Insulin Pen Needle 32G X 6 MM MISC 1 each by Does not apply route in the morning, at noon, and at bedtime. E10.52 270 each 3  . lisinopril (PRINIVIL,ZESTRIL) 10 MG tablet Take 1 tablet (10 mg total) by mouth daily. 30 tablet 0  . Multiple Vitamin (MULTIVITAMIN WITH MINERALS) TABS tablet Take 1 tablet by mouth daily.    . nitroGLYCERIN (NITRODUR - DOSED IN MG/24 HR) 0.2 mg/hr patch Place 1 patch (0.2 mg total) onto the skin daily. 30 patch 11  . pantoprazole (PROTONIX) 40 MG tablet Take 1 tablet (40 mg total) by mouth daily. 30 tablet 3   No current facility-administered medications on file prior to visit.    No Known Allergies  Family History  Problem Relation Age of Onset  . Hypertension Mother   . Diabetes Mother   . Cancer Mother   . Prostate cancer Father     BP (!) 148/82   Pulse 79   Ht 6' (1.829 m)   Wt 188 lb (85.3 kg)   SpO2 97%   BMI 25.50 kg/m    Review of Systems Denies LOC    Objective:   Physical Exam VITAL SIGNS:  See vs page GENERAL: no distress Pulses: left dorsalis pedis intact.   MSK:  Left transmetatarsal amputation.  Left foot ulcer is healed Ext: right BKA Neuro: sensation is intact to touch on the left foot.   CV: no edema of the left leg.    Lab Results  Component Value Date   HGBA1C 6.6 (A) 12/08/2019   Lab Results  Component Value Date   CREATININE  1.2 09/17/2018   BUN 28 (A) 09/17/2018   NA 136 10/04/2016   K 4.3 10/04/2016   CL 103 10/04/2016   CO2 24 10/04/2016       Assessment & Plan:  HTN: is noted today Type 1 DM, with PAD. Hypoglycemia, due to insulin.  Patient Instructions  Your blood pressure is high today.  Please see your primary care provider soon, to have it rechecked please reduce the  Basaglar to 2 units daily, and change the humalog to 10-12 units with breakfast, 5-7 units with lunch, and 10-12 units with supper.   check your blood sugar 4 times a day: before the 3 meals, and at bedtime.  also check if you have symptoms of your blood sugar being too high or too low.  please keep a record of the readings and bring it to your next appointment here (or you can bring the meter itself).  You can write it on any piece of paper.  please call us sooner if your blood sugar goes below 70, or if you have a lot of readings over 200.   Please come back for a follow-up appointment in 2-3 months.

## 2020-01-11 ENCOUNTER — Other Ambulatory Visit: Payer: Self-pay | Admitting: Endocrinology

## 2020-01-11 DIAGNOSIS — E1052 Type 1 diabetes mellitus with diabetic peripheral angiopathy with gangrene: Secondary | ICD-10-CM

## 2020-01-17 ENCOUNTER — Other Ambulatory Visit: Payer: Self-pay | Admitting: Orthopaedic Surgery

## 2020-01-18 NOTE — Telephone Encounter (Signed)
Could you please advise since Dr. Yates is out of the office? 

## 2020-01-20 ENCOUNTER — Other Ambulatory Visit: Payer: Self-pay | Admitting: Endocrinology

## 2020-01-20 DIAGNOSIS — E1052 Type 1 diabetes mellitus with diabetic peripheral angiopathy with gangrene: Secondary | ICD-10-CM

## 2020-02-10 ENCOUNTER — Other Ambulatory Visit: Payer: Self-pay | Admitting: Physician Assistant

## 2020-03-01 ENCOUNTER — Other Ambulatory Visit: Payer: Self-pay | Admitting: Endocrinology

## 2020-03-01 DIAGNOSIS — E1052 Type 1 diabetes mellitus with diabetic peripheral angiopathy with gangrene: Secondary | ICD-10-CM

## 2020-03-06 ENCOUNTER — Encounter: Payer: Self-pay | Admitting: Endocrinology

## 2020-03-10 ENCOUNTER — Ambulatory Visit: Payer: Self-pay | Admitting: Endocrinology

## 2020-03-14 ENCOUNTER — Other Ambulatory Visit: Payer: Self-pay | Admitting: Endocrinology

## 2020-03-14 DIAGNOSIS — E1052 Type 1 diabetes mellitus with diabetic peripheral angiopathy with gangrene: Secondary | ICD-10-CM

## 2020-04-01 ENCOUNTER — Other Ambulatory Visit: Payer: Self-pay | Admitting: Physician Assistant

## 2020-04-13 ENCOUNTER — Ambulatory Visit: Payer: Self-pay | Admitting: Endocrinology

## 2020-04-18 ENCOUNTER — Other Ambulatory Visit: Payer: Self-pay | Admitting: Physician Assistant

## 2020-05-26 ENCOUNTER — Ambulatory Visit: Payer: Self-pay | Admitting: Endocrinology

## 2020-06-06 ENCOUNTER — Other Ambulatory Visit: Payer: Self-pay

## 2020-06-06 ENCOUNTER — Ambulatory Visit (INDEPENDENT_AMBULATORY_CARE_PROVIDER_SITE_OTHER): Payer: Self-pay | Admitting: Endocrinology

## 2020-06-06 VITALS — BP 146/82 | HR 68 | Ht 72.0 in | Wt 181.2 lb

## 2020-06-06 DIAGNOSIS — E1052 Type 1 diabetes mellitus with diabetic peripheral angiopathy with gangrene: Secondary | ICD-10-CM

## 2020-06-06 LAB — POCT GLYCOSYLATED HEMOGLOBIN (HGB A1C): Hemoglobin A1C: 7.2 % — AB (ref 4.0–5.6)

## 2020-06-06 MED ORDER — NOVOLOG FLEXPEN 100 UNIT/ML ~~LOC~~ SOPN: PEN_INJECTOR | SUBCUTANEOUS | 3 refills | Status: DC

## 2020-06-06 MED ORDER — DEXCOM G6 SENSOR MISC
1.0000 | 3 refills | Status: DC
Start: 2020-06-06 — End: 2020-08-23

## 2020-06-06 NOTE — Patient Instructions (Addendum)
Your blood pressure is high today.  Please see your primary care provider soon, to have it rechecked please stop taking the Basaglar, and change the Novolog to 11-13 units with breakfast, 5-7 units with lunch, and 12-14 units with supper.   check your blood sugar 4 times a day: before the 3 meals, and at bedtime.  also check if you have symptoms of your blood sugar being too high or too low.  please keep a record of the readings and bring it to your next appointment here (or you can bring the meter itself).  You can write it on any piece of paper.  please call us sooner if your blood sugar goes below 70, or if you have a lot of readings over 200.   Please come back for a follow-up appointment in 2-3 months.

## 2020-06-06 NOTE — Progress Notes (Signed)
Subjective:    Patient ID: Gordon Walker, male    DOB: [birth year], 55 y.o.   MRN: [id number]  HPI Pt returns for f/u of diabetes mellitus:  DM type: 1 Dx'ed: 1998 Complications: PN, PAD, bilat LE amputations, CRI, and DR.  Therapy: insulin since dx.  DKA: never.   Severe hypoglycemia: last episode was 2017.   Pancreatitis: never.   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   Interval history: I reviewed continuous glucose monitor data.  glucose varies from 50-220.  It is in general lowest at 2AM-4AM, and highest at 8AM and 11PM.  He has mild hypoglycemia approx twice per week.  This happens fasting.    Past Medical History:  Diagnosis Date  . Complication of anesthesia    anesthesia complication 01/18/16; pt unaware what happened   . Dehiscence of amputation stump (HCC)    right  . Diabetes mellitus without complication (HCC)    Type 1  . Heart murmur    "years ago"  . Hypertension    "years ago"  . 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, with long-term current use of insulin [facility])     Past Surgical History:  Procedure Laterality Date  . AMPUTATION Left 01/07/2015   Procedure: Left Foot 1st Ray Amputation;  Surgeon: Nadara Mustard, MD;  Location: [facility] OR;  Service: Orthopedics;  Laterality: Left;  . AMPUTATION Left 07/15/2015   Procedure: Left Transmetatarsal Amputation;  Surgeon: Nadara Mustard, MD;  Location[facility] OR;  Service: Orthopedics;  Laterality: Left;  . AMPUTATION Right 12/30/2015   Procedure: 5th Ray Amputation Right Foot;  Surgeon: Nadara Mustard, MD;  Location: [facility] OR;  Service: Orthopedics;  Laterality: Right;  . AMPUTATION Right 01/15/2016   Procedure: GUILLOTINE AMPUTATION  ABOVE THE ANKLE AMPUTATION;  Surgeon: Eldred Manges, MD;  Location: [facility] OR;  Service: Orthopedics;  Laterality: Right;  . AMPUTATION Right 01/18/2016    Procedure: AMPUTATION BELOW KNEE;  Surgeon: Eldred Manges, MD;  Location: [facility] OR;  Service: Orthopedics;  Laterality: Right;  . STUMP REVISION Right 03/21/2016   Procedure: Right Below Knee Amputation Stump Revision, WOUND VAC application;  Surgeon: Eldred Manges, MD;  Location: [facility] OR;  Service: Orthopedics;  Laterality: Right;    Social History   Socioeconomic History  . Marital status: Married    Spouse name: Not on file  . Number of children: Not on file  . Years of education: Not on file  . Highest education level: Not on file  Occupational History  . Not on file  Tobacco Use  . Smoking status: Never Smoker  . Smokeless tobacco: Never Used  Substance and Sexual Activity  . Alcohol use: No  . Drug use: No  . Sexual activity: Not on file  Other Topics Concern  . Not on file  Social History Narrative  . Not on file   Social Determinants of Health   Financial Resource Strain: Not on file  Food Insecurity: Not on file  Transportation Needs: Not on file  Physical Activity: Not on file  Stress: Not on file  Social Connections: Not on file  Intimate Partner Violence: Not on file    Current Outpatient Medications on File Prior to Visit  Medication Sig Dispense Refill  . aspirin EC 81 MG tablet Take 81 mg by mouth daily.    . Continuous Blood Gluc  Transmit (DEXCOM G6 TRANSMITTER) MISC Use one transmitter once every 90 days to transmit blood sugars. 1 each 3  . Insulin Pen Needle 32G X 6 MM MISC 1 each by Does not apply route in the morning, at noon, and at bedtime. E10.52 270 each 3  . lisinopril (PRINIVIL,ZESTRIL) 10 MG tablet Take 1 tablet (10 mg total) by mouth daily. 30 tablet 0  . Multiple Vitamin (MULTIVITAMIN WITH MINERALS) TABS tablet Take 1 tablet by mouth daily.    . nitroGLYCERIN (NITRODUR - DOSED IN MG/24 HR) 0.2 mg/hr patch APPLY 1 PATCH TOPICALLY ONCE DAILY 30 patch 0  . pantoprazole (PROTONIX) 40 MG tablet Take 1 tablet (40 mg total) by mouth daily. 30 tablet 3    No current facility-administered medications on file prior to visit.    No Known Allergies  Family History  Problem Relation Age of Onset  . Hypertension Mother   . Diabetes Mother   . Cancer Mother   . Prostate cancer Father     BP (!) 146/82 (BP Location: Right Arm, Patient Position: Sitting, Cuff Size: Normal)   Pulse 68   Ht 6' (1.829 m)   Wt 181 lb 3.2 oz (82.2 kg)   SpO2 99%   BMI 24.58 kg/m    Review of Systems     Objective:   Physical Exam VITAL SIGNS:  See vs page GENERAL: no distress Pulses: left dorsalis pedis intact.   MSK:  Left transmetatarsal amputation.  Left foot ulcer is noted (sees wound care).   Ext: right BKA Neuro: sensation is intact to touch on the left foot.   CV: no edema of the left leg.    Lab Results  Component Value Date   HGBA1C 7.2 (A) 06/06/2020        Assessment & Plan:  Type 1 DM:  Hypoglycemia, due to insulin: The pattern of his cbg's indicates he needs some adjustment in his therapy.    Patient Instructions  Your blood pressure is high today.  Please see your primary care provider soon, to have it rechecked please stop taking the Basaglar, and change the Novolog to 11-13 units with breakfast, 5-7 units with lunch, and 12-14 units with supper.   check your blood sugar 4 times a day: before the 3 meals, and at bedtime.  also check if you have symptoms of your blood sugar being too high or too low.  please keep a record of the readings and bring it to your next appointment here (or you can bring the meter itself).  You can write it on any piece of paper.  please call us sooner if your blood sugar goes below 70, or if you have a lot of readings over 200.   Please come back for a follow-up appointment in 2-3 months.

## 2020-06-21 ENCOUNTER — Ambulatory Visit: Payer: BC Managed Care – PPO | Admitting: Orthopaedic Surgery

## 2020-06-22 ENCOUNTER — Other Ambulatory Visit: Payer: Self-pay

## 2020-06-22 ENCOUNTER — Encounter: Payer: Self-pay | Admitting: Orthopaedic Surgery

## 2020-06-22 ENCOUNTER — Ambulatory Visit: Payer: Self-pay

## 2020-06-22 ENCOUNTER — Ambulatory Visit (INDEPENDENT_AMBULATORY_CARE_PROVIDER_SITE_OTHER): Payer: BC Managed Care – PPO | Admitting: Orthopaedic Surgery

## 2020-06-22 VITALS — BP 144/78 | HR 70 | Ht 72.0 in | Wt 183.0 lb

## 2020-06-22 DIAGNOSIS — M79672 Pain in left foot: Secondary | ICD-10-CM | POA: Diagnosis not present

## 2020-06-22 DIAGNOSIS — M86472 Chronic osteomyelitis with draining sinus, left ankle and foot: Secondary | ICD-10-CM

## 2020-06-22 NOTE — Progress Notes (Signed)
Office Visit Note   Patient: Gordon Walker           Date of Birth: 1965/09/03           MRN: [id number] Visit Date: 06/22/2020              Requested by: Darrow Bussing, MD [address] [place],  [place] [address] PCP: Darrow Bussing, MD   Assessment & Plan: Visit Diagnoses:  1. Pain in left foot   2. Chronic osteomyelitis of left foot with draining sinus (HCC)     Plan:  Patient done a good job keeping his A1c below 7.5 with type 1 diabetes.  Unfortunately he is got some peripheral arterial disease with microcirculation damage due to his diabetes and diabetic peripheral angiopathy.  I discussed referring him to Dr. Lajoyce Corners since loss of lateral foot motor would require more proximal amputation which would be below-knee amputation.  This was discussed with patient and detail.  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  We will set him up to see Dr. Lajoyce Corners to discuss this further. Follow-Up Instructions: No follow-ups on file.   Orders:  Orders Placed This Encounter  Procedures  . XR Foot Complete Left   No orders of the defined types were placed in this encounter.     Procedures: No procedures performed   Clinical Data: No additional findings.   Subjective: Chief Complaint  Patient presents with  . ulcer left foot    HPI 55 year old male with known insulin-dependent diabetes with previous BKA amputation on the right side 2018.  Later problems of the left foot with infection requiring transmetatarsal amputation.  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  He has had some ulceration over the plantar surface laterally of the his transmetatarsal amputation on the left with some necrotic plantar fascia exposed.  Review of Systems diabetes on insulin chronically.  Type 1 diabetes.  Previous amputation on the right BKA.   Objective: Vital Signs: BP (!) 144/78   Pulse 70   Ht 6' (1.829 m)   Wt  183 lb (83 kg)   BMI 24.82 kg/m   Physical Exam Constitutional:      Appearance: He is well-developed.  HENT:     Head: Normocephalic and atraumatic.  Eyes:     Pupils: Pupils are equal, round, and reactive to light.  Neck:     Thyroid: No thyromegaly.     Trachea: No tracheal deviation.  Cardiovascular:     Rate and Rhythm: Normal rate.  Pulmonary:     Effort: Pulmonary effort is normal.     Breath sounds: No wheezing.  Abdominal:     General: Bowel sounds are normal.     Palpations: Abdomen is soft.  Skin:    General: Skin is warm and dry.     Capillary Refill: Capillary refill takes less than 2 seconds.  Neurological:     Mental Status: He is alert and oriented to person, place, and time.  Psychiatric:        Behavior: Behavior normal.        Thought Content: Thought content normal.        Judgment: Judgment normal.     Ortho Exam patient has some subacute cutaneous necrosis of fascia and some exposed peroneal brevis insertion.  No cellulitis, foul-smelling with subcu necrosis. Specialty Comments:  No specialty comments available.  Imaging: No results  found.   PMFS History: Patient Active Problem List   Diagnosis Date Noted  . Ankle ulcer, left, with unspecified severity (HCC) 02/02/2019  . Hypertension 10/03/2016  . RUQ pain 10/03/2016  . Elevated liver function tests   . Intractable vomiting with nausea   . Sludge in gallbladder   . Wound dehiscence, surgical   . Acute blood loss anemia 01/20/2016  . Status post below knee amputation of right lower extremity (HCC) 01/20/2016  . Hyponatremia 01/17/2016  . AKI (acute kidney injury) (HCC) 01/17/2016  . Hyperbilirubinemia 01/17/2016  . Bacteremia due to Escherichia coli 01/17/2016  . Sepsis (HCC) 01/17/2016  . DKA (diabetic ketoacidoses) 01/15/2016  . Controlled diabetes mellitus with diabetic peripheral angiopathy and gangrene (HCC)   . Type 1 diabetes mellitus with diabetic peripheral angiopathy and  gangrene, with long-term current use of insulin (HCC)   . Transaminitis 01/07/2015  . Osteomyelitis of left foot (HCC) 01/06/2015  . Uncontrolled diabetes mellitus with foot ulcer, with long-term current use of insulin (HCC) 01/06/2015  . Anemia 01/06/2015  . Osteomyelitis (HCC) 01/05/2015   Past Medical History:  Diagnosis Date  . Complication of anesthesia    anesthesia complication 01/18/16; pt unaware what happened   . Dehiscence of amputation stump (HCC)    right  . Diabetes mellitus without complication (HCC)    Type 1  . Heart murmur    "years ago"  . Hypertension    "years ago"  . 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, with long-term current use of insulin (HCC)     Family History  Problem Relation Age of Onset  . Hypertension Mother   . Diabetes Mother   . Cancer Mother   . Prostate cancer Father     Past Surgical History:  Procedure Laterality Date  . AMPUTATION Left 01/07/2015   Procedure: Left Foot 1st Ray Amputation;  Surgeon: Nadara Mustard, MD;  Location: [facility] OR;  Service: Orthopedics;  Laterality: Left;  . AMPUTATION Left 07/15/2015   Procedure: Left Transmetatarsal Amputation;  Surgeon: Nadara Mustard, MD;  Location: [facility] OR;  Service: Orthopedics;  Laterality: Left;  . AMPUTATION Right 12/30/2015   Procedure: 5th Ray Amputation Right Foot;  Surgeon: Nadara Mustard, MD;  Location: [facility] OR;  Service: Orthopedics;  Laterality: Right;  . AMPUTATION Right 01/15/2016   Procedure: GUILLOTINE AMPUTATION  ABOVE THE ANKLE AMPUTATION;  Surgeon: Eldred Manges, MD;  Location: [facility] OR;  Service: Orthopedics;  Laterality: Right;  . AMPUTATION Right 01/18/2016   Procedure: AMPUTATION BELOW KNEE;  Surgeon: Eldred Manges, MD;  Location: [facility] OR;  Service: Orthopedics;  Laterality: Right;  . STUMP REVISION Right 03/21/2016   Procedure: Right Below Knee Amputation Stump Revision, WOUND VAC application;  Surgeon: Eldred Manges, MD;  Location: [facility] OR;  Service:  Orthopedics;  Laterality: Right;   Social History   Occupational History  . Not on file  Tobacco Use  . Smoking status: Never Smoker  . Smokeless tobacco: Never Used  Substance and Sexual Activity  . Alcohol use: No  . Drug use: No  . Sexual activity: Not on file

## 2020-06-23 ENCOUNTER — Ambulatory Visit (INDEPENDENT_AMBULATORY_CARE_PROVIDER_SITE_OTHER): Payer: BC Managed Care – PPO | Admitting: Orthopedic Surgery

## 2020-06-23 ENCOUNTER — Encounter: Payer: Self-pay | Admitting: Orthopedic Surgery

## 2020-06-23 DIAGNOSIS — Z89511 Acquired absence of right leg below knee: Secondary | ICD-10-CM

## 2020-06-23 DIAGNOSIS — M86472 Chronic osteomyelitis with draining sinus, left ankle and foot: Secondary | ICD-10-CM | POA: Diagnosis not present

## 2020-06-24 ENCOUNTER — Telehealth: Payer: Self-pay | Admitting: Orthopaedic Surgery

## 2020-06-24 MED ORDER — SILVER SULFADIAZINE 1 % EX CREA: 1.0000 "application " | TOPICAL_CREAM | Freq: Every day | CUTANEOUS | 0 refills | Status: DC

## 2020-06-24 NOTE — Telephone Encounter (Signed)
Yes OK. thanks

## 2020-06-24 NOTE — Telephone Encounter (Signed)
Patient called requesting a return to work note from Dr. Ophelia Charter. Patient states he has a scooter and can still work. Pt also complained that Dr. Lajoyce Corners upset him and wishes to never see Dr. Lajoyce Corners again. He states he will be looking for another foot dr for second opinion. Please call patient when return to work note is ready at [phone].

## 2020-06-24 NOTE — Telephone Encounter (Signed)
I called patient. He would like note emailed to [email or web address]. He also asked for rx for silvadene. OK per Dr Ophelia Charter. Rx sent to [facility] on [address]. Note emailed. Patient advised.

## 2020-06-24 NOTE — Telephone Encounter (Signed)
Please advise on work note. Thanks! °

## 2020-07-03 ENCOUNTER — Encounter: Payer: Self-pay | Admitting: Orthopedic Surgery

## 2020-07-03 NOTE — Progress Notes (Signed)
Office Visit Note   Patient: Gordon Walker           Date of Birth: Dec 24, 1965           MRN: [id number] Visit Date: 06/23/2020              Requested by: Darrow Bussing, MD [address] [place],  [place] [address] PCP: Darrow Bussing, MD  Chief Complaint  Patient presents with   Left Foot - Pain      HPI: Patient is a 55 year old gentleman who is status post a right below the knee amputation by Dr. Ophelia Charter in February 2018.  Patient has had a left foot ulcer radiographs were obtained in the office yesterday patient is seen for evaluation for possible treatment of the left foot.  Most recent hemoglobin A1c was 7.2.  Patient states that he has had this ulcer on the left transmetatarsal amputation for 3 weeks.  He states he started antibiotics yesterday.  Assessment & Plan: Visit Diagnoses:  1. Status post below knee amputation of right lower extremity (HCC)   2. 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  Patient states he does not want to consider surgery at this time we will follow-up with him on Monday.  Follow-Up Instructions: Return in about 1 week (around 06/30/2020).   Ortho Exam  Patient is alert, oriented, no adenopathy, well-dressed, normal affect, normal respiratory effort. Examination patient has a necrotic ulcer over the base of the transmetatarsal amputation left foot the ulcer is 5 cm in diameter that extends down to bone.  There is necrotic fascia there is destructive bony changes of the base of the fifth metatarsal radiographs shows chronic destructive changes at the base of the fifth metatarsal as well.  Imaging: No results found. No images are attached to the encounter.  Labs: Lab Results  Component Value Date   HGBA1C 7.2 (A) 06/06/2020   HGBA1C 6.6 (A) 12/08/2019   HGBA1C 7.2 (A) 09/07/2019   REPTSTATUS 01/19/2016 FINAL  01/17/2016   CULT MRSA DETECTED 01/17/2016   LABORGA ESCHERICHIA COLI 01/14/2016     Lab Results  Component Value Date   ALBUMIN 3.4 (L) 10/04/2016   ALBUMIN 4.0 10/03/2016   ALBUMIN 3.9 10/03/2016    Lab Results  Component Value Date   MG 1.9 01/17/2016   MG 2.5 (H) 01/16/2016   MG 2.9 (H) 01/15/2016   No results found for: VD25OH  No results found for: PREALBUMIN CBC EXTENDED Latest Ref Rng & Units 10/04/2016 10/03/2016 03/23/2016  WBC 4.0 - 10.5 K/uL 9.7 10.0 6.3  RBC 4.22 - 5.81 MIL/uL 4.59 4.71 3.32(L)  HGB 13.0 - 17.0 g/dL 19.1 47.8 2.9(F)  HCT 62.1 - 52.0 % 38.9(L) 38.9(L) 28.5(L)  PLT 150 - 400 K/uL 239 271 264  NEUTROABS 1.7 - 7.7 K/uL - - 4.1  LYMPHSABS 0.7 - 4.0 K/uL - - 1.3     There is no height or weight on file to calculate BMI.  Orders:  No orders of the defined types were placed in this encounter.  No orders of the defined types were placed in this encounter.    Procedures: No procedures performed  Clinical Data: No additional findings.  ROS:  All other systems negative, except as noted in the HPI. Review of Systems  Objective: Vital Signs: There were no vitals taken for this visit.  Specialty Comments:  No specialty comments available.  PMFS History: Patient Active Problem List   Diagnosis Date Noted   Ankle ulcer, left, with unspecified severity (HCC) 02/02/2019   Hypertension 10/03/2016   RUQ pain 10/03/2016   Elevated liver function tests    Intractable vomiting with nausea    Sludge in gallbladder    Wound dehiscence, surgical    Acute blood loss anemia 01/20/2016   Status post below knee amputation of right lower extremity (HCC) 01/20/2016   Hyponatremia 01/17/2016   AKI (acute kidney injury) (HCC) 01/17/2016   Hyperbilirubinemia 01/17/2016   Bacteremia due to Escherichia coli 01/17/2016   Sepsis (HCC) 01/17/2016   DKA (diabetic ketoacidoses) 01/15/2016   Controlled diabetes mellitus with diabetic peripheral angiopathy and  gangrene (HCC)    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, with long-term current use of insulin (HCC)    Transaminitis 01/07/2015   Osteomyelitis of left foot (HCC) 01/06/2015   Uncontrolled diabetes mellitus with foot ulcer, with long-term current use of insulin (HCC) 01/06/2015   Anemia 01/06/2015   Osteomyelitis (HCC) 01/05/2015   Past Medical History:  Diagnosis Date   Complication of anesthesia    anesthesia complication 01/18/16; pt unaware what happened    Dehiscence of amputation stump (HCC)    right   Diabetes mellitus without complication (HCC)    Type 1   Heart murmur    "years ago"   Hypertension    "years ago"   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, with long-term current use of insulin (HCC)     Family History  Problem Relation Age of Onset   Hypertension Mother    Diabetes Mother    Cancer Mother    Prostate cancer Father     Past Surgical History:  Procedure Laterality Date   AMPUTATION Left 01/07/2015   Procedure: Left Foot 1st Ray Amputation;  Surgeon: Nadara Mustard, MD;  Location[facility] OR;  Service: Orthopedics;  Laterality: Left;   AMPUTATION Left 07/15/2015   Procedure: Left Transmetatarsal Amputation;  Surgeon: Nadara Mustard, MD;  Location: [facility] OR;  Service: Orthopedics;  Laterality: Left;   AMPUTATION Right 12/30/2015   Procedure: 5th Ray Amputation Right Foot;  Surgeon: Nadara Mustard, MD;  Location: [facility] OR;  Service: Orthopedics;  Laterality: Right;   AMPUTATION Right 01/15/2016   Procedure: GUILLOTINE AMPUTATION  ABOVE THE ANKLE AMPUTATION;  Surgeon: Eldred Manges, MD;  Location: [facility] OR;  Service: Orthopedics;  Laterality: Right;   AMPUTATION Right 01/18/2016   Procedure: AMPUTATION BELOW KNEE;  Surgeon: Eldred Manges, MD;  Location: [facility] OR;  Service: Orthopedics;  Laterality: Right;   STUMP REVISION Right 03/21/2016   Procedure: Right Below Knee Amputation Stump Revision, WOUND VAC  application;  Surgeon: Eldred Manges, MD;  Location: [facility] OR;  Service: Orthopedics;  Laterality: Right;   Social History   Occupational History   Not on file  Tobacco Use   Smoking status: Never   Smokeless tobacco: Never  Substance and Sexual Activity   Alcohol use: No   Drug use: No   Sexual activity: Not on file

## 2020-08-18 ENCOUNTER — Telehealth: Payer: Self-pay | Admitting: Endocrinology

## 2020-08-18 NOTE — Telephone Encounter (Signed)
Patient left message on after hours phone stating that he was taken off of one of his insulin and asks if he could be put back on it? He also asks for an rx for his dexcom transmitter called in. Asks to be called back at [phone] to discuss getting back on insulin.   [facility] [address], [place] - [address]  [address] [place] [address]  Phone:  [phone]  Fax:  [phone]

## 2020-08-19 NOTE — Telephone Encounter (Signed)
Message sent thru MyChart 

## 2020-08-23 ENCOUNTER — Other Ambulatory Visit: Payer: Self-pay

## 2020-08-23 DIAGNOSIS — E1052 Type 1 diabetes mellitus with diabetic peripheral angiopathy with gangrene: Secondary | ICD-10-CM

## 2020-08-23 MED ORDER — DEXCOM G6 TRANSMITTER MISC: 3 refills | Status: DC

## 2020-08-23 MED ORDER — DEXCOM G6 SENSOR MISC: 1.0000 | 3 refills | Status: DC

## 2020-08-29 ENCOUNTER — Telehealth: Payer: Self-pay | Admitting: Radiology

## 2020-08-29 MED ORDER — SILVER SULFADIAZINE 1 % EX CREA: 1.0000 "application " | TOPICAL_CREAM | Freq: Every day | CUTANEOUS | 0 refills | Status: DC

## 2020-08-29 NOTE — Telephone Encounter (Signed)
Refill request from [facility] on [address] received for Silver Sulfadiazine 1% cream, apply cream externally to affected area once daily qty 50.  OK to refill?

## 2020-08-29 NOTE — Telephone Encounter (Signed)
Sent to pharmacy 

## 2020-08-29 NOTE — Addendum Note (Signed)
Addended by: Rogers Seeds on: 08/29/2020 11:53 AM   Modules accepted: Orders

## 2020-08-30 ENCOUNTER — Other Ambulatory Visit: Payer: Self-pay | Admitting: Orthopaedic Surgery

## 2020-09-12 ENCOUNTER — Ambulatory Visit: Payer: BC Managed Care – PPO | Admitting: Endocrinology

## 2020-09-29 NOTE — Telephone Encounter (Signed)
Called pt advised pt of  changes of insulin , pt was still not comfortable with changes. Pt feels like the he needs to take a 24 order pill to help bring, sugar levels down. Pt said that he is willing to try dosage change and will call us back if this doesn't help.

## 2020-09-29 NOTE — Telephone Encounter (Signed)
Pt calling in voiced that states that his sugars have been running high ever sent the basaglar was taken away.  09/28/2020 333 at 12pm  09/28/2020 109 4:52pm 09/28/2020 271 at 9:23pm 09/29/2020 227 at 8:09 this morning

## 2020-10-02 ENCOUNTER — Other Ambulatory Visit: Payer: Self-pay | Admitting: Orthopaedic Surgery

## 2020-10-03 NOTE — Telephone Encounter (Signed)
Please advise 

## 2020-10-06 NOTE — Telephone Encounter (Signed)
Patient couldn't do the 4:30pm appointment so I scheduled for  9/20 at 8am

## 2020-10-06 NOTE — Telephone Encounter (Signed)
Patient called again re: Patient requests to go back on Basaglar to help keep his blood sugars down (Patient states while Patient was on Basaglar Patient states his blood sugars were perfect).  Patient requests a new Rx for Basaglar be sent to:  [facility] [address], [place] - [address] Phone:  [phone]  Fax:  [phone]

## 2020-10-11 ENCOUNTER — Other Ambulatory Visit: Payer: Self-pay

## 2020-10-11 ENCOUNTER — Ambulatory Visit (INDEPENDENT_AMBULATORY_CARE_PROVIDER_SITE_OTHER): Payer: BC Managed Care – PPO | Admitting: Endocrinology

## 2020-10-11 VITALS — BP 150/82 | HR 86 | Ht 72.0 in | Wt 179.4 lb

## 2020-10-11 DIAGNOSIS — E1052 Type 1 diabetes mellitus with diabetic peripheral angiopathy with gangrene: Secondary | ICD-10-CM

## 2020-10-11 LAB — POCT GLYCOSYLATED HEMOGLOBIN (HGB A1C): Hemoglobin A1C: 7.4 % — AB (ref 4.0–5.6)

## 2020-10-11 MED ORDER — DEXCOM G6 SENSOR MISC: 1.0000 | 3 refills | Status: DC

## 2020-10-11 MED ORDER — DEXCOM G6 TRANSMITTER MISC: 3 refills | Status: DC

## 2020-10-11 MED ORDER — NOVOLOG FLEXPEN 100 UNIT/ML ~~LOC~~ SOPN: PEN_INJECTOR | SUBCUTANEOUS | 3 refills | Status: DC

## 2020-10-11 NOTE — Progress Notes (Signed)
Subjective:    Patient ID: Gordon Walker, male    DOB: 09/08/1965, 55 y.o.   MRN: [id number]  HPI Pt returns for f/u of diabetes mellitus:  DM type: 1 Dx'ed: 1998 Complications: PN, PAD, bilat LE amputations, CRI, and DR.  Therapy: insulin since dx.  DKA: never.   Severe hypoglycemia: last episode was 2017.   Pancreatitis: never.   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   Other: he takes multiple daily injections; he declines pump rx; he uses dexcom G-6 continuous glucose monitor; pattern of cbg's says he does not need basal insulin.   Interval history: he is not using continuous glucose monitor. He brings his meter with his cbg's which I have reviewed today.  glucose varies from 68-404.  It is in general highest at HS.  He seldom has hypoglycemia, and these episodes are mild.  He takes novolog approx 3 times a day (just before each meal) 13-15-13 units.  Confirmed by readback.  He declines fructosamine. Past Medical History:  Diagnosis Date   Complication of anesthesia    anesthesia complication 01/18/16; pt unaware what happened    Dehiscence of amputation stump (HCC)    right   Diabetes mellitus without complication (HCC)    Type 1   Heart murmur    "years ago"   Hypertension    "years ago"   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, with long-term current use of insulin (HCC)     Past Surgical History:  Procedure Laterality Date   AMPUTATION Left 01/07/2015   Procedure: Left Foot 1st Ray Amputation;  Surgeon: Nadara Mustard, MD;  Location: [facility] OR;  Service: Orthopedics;  Laterality: Left;   AMPUTATION Left 07/15/2015   Procedure: Left Transmetatarsal Amputation;  Surgeon: Nadara Mustard, MD;  Location: [facility] OR;  Service: Orthopedics;  Laterality: Left;   AMPUTATION Right 12/30/2015   Procedure: 5th Ray Amputation Right Foot;  Surgeon: Nadara Mustard, MD;  Location: [facility] OR;  Service: Orthopedics;  Laterality: Right;   AMPUTATION Right 01/15/2016    Procedure: GUILLOTINE AMPUTATION  ABOVE THE ANKLE AMPUTATION;  Surgeon: Eldred Manges, MD;  Location: [facility] OR;  Service: Orthopedics;  Laterality: Right;   AMPUTATION Right 01/18/2016   Procedure: AMPUTATION BELOW KNEE;  Surgeon: Eldred Manges, MD;  Location: [facility] OR;  Service: Orthopedics;  Laterality: Right;   STUMP REVISION Right 03/21/2016   Procedure: Right Below Knee Amputation Stump Revision, WOUND VAC application;  Surgeon: Eldred Manges, MD;  Location: [facility] OR;  Service: Orthopedics;  Laterality: Right;    Social History   Socioeconomic History   Marital status: Married    Spouse name: Not on file   Number of children: Not on file   Years of education: Not on file   Highest education level: Not on file  Occupational History   Not on file  Tobacco Use   Smoking status: Never   Smokeless tobacco: Never  Substance and Sexual Activity   Alcohol use: No   Drug use: No   Sexual activity: Not on file  Other Topics Concern   Not on file  Social History Narrative   Not on file   Social Determinants of Health   Financial Resource Strain: Not on file  Food Insecurity: Not on file  Transportation Needs: Not on file  Physical Activity: Not on file  Stress: Not on file  Social Connections: Not on file  Intimate Partner Violence: Not on file  Current Outpatient Medications on File Prior to Visit  Medication Sig Dispense Refill   amLODipine (NORVASC) 5 MG tablet Take 1 tablet by mouth daily.     aspirin EC 81 MG tablet Take 81 mg by mouth daily.     atorvastatin (LIPITOR) 20 MG tablet Take 1 tablet by mouth daily.     doxycycline (VIBRAMYCIN) 100 MG capsule Take 1 capsule by mouth twice daily 40 capsule 0   Insulin Pen Needle 32G X 6 MM MISC 1 each by Does not apply route in the morning, at noon, and at bedtime. E10.52 270 each 3   lisinopril (PRINIVIL,ZESTRIL) 10 MG tablet Take 1 tablet (10 mg total) by mouth daily. 30 tablet 0   Multiple Vitamin (MULTIVITAMIN WITH MINERALS) TABS  tablet Take 1 tablet by mouth daily.     nitroGLYCERIN (NITRODUR - DOSED IN MG/24 HR) 0.2 mg/hr patch APPLY 1 PATCH TOPICALLY ONCE DAILY 30 patch 0   pantoprazole (PROTONIX) 40 MG tablet Take 1 tablet (40 mg total) by mouth daily. 30 tablet 3   SSD 1 % cream APPLY  CREAM EXTERNALLY TO AFFECTED AREA ONCE DAILY 50 g 0   No current facility-administered medications on file prior to visit.    No Known Allergies  Family History  Problem Relation Age of Onset   Hypertension Mother    Diabetes Mother    Cancer Mother    Prostate cancer Father     BP (!) 150/82 (BP Location: Right Arm, Patient Position: Sitting, Cuff Size: Normal)   Pulse 86   Ht 6' (1.829 m)   Wt 179 lb 6.4 oz (81.4 kg)   SpO2 98%   BMI 24.33 kg/m    Review of Systems     Objective:   Physical Exam Pulses: left dorsalis pedis intact.   MSK:  Left transmetatarsal amputation.  Left foot ulcer is bandaged (sees wound care).   Ext: right BKA Neuro: sensation is intact to touch on the left foot.   CV: no edema of the left leg.   Lab Results  Component Value Date   HGBA1C 7.4 (A) 10/11/2020   Lab Results  Component Value Date   CREATININE 1.2 09/17/2018   BUN 28 (A) 09/17/2018   NA 136 10/04/2016   K 4.3 10/04/2016   CL 103 10/04/2016   CO2 24 10/04/2016      Assessment & Plan:  Type 1 DM Hypoglycemia, due to insulin: The pattern of his cbg's indicates he needs some adjustment in his therapy.   Patient Instructions  Your blood pressure is high today.  Please see your primary care provider soon, to have it rechecked please increase the Novolog to 12-14 units with breakfast, 14-16 units with lunch, and 14-16 units with supper.   check your blood sugar 4 times a day: before the 3 meals, and at bedtime.  also check if you have symptoms of your blood sugar being too high or too low.  please keep a record of the readings and bring it to your next appointment here (or you can bring the meter itself).  You can  write it on any piece of paper.  please call us sooner if your blood sugar goes below 70, or if you have a lot of readings over 200.   Please come back for a follow-up appointment in 2-3 months.

## 2020-10-11 NOTE — Patient Instructions (Addendum)
Your blood pressure is high today.  Please see your primary care provider soon, to have it rechecked please increase the Novolog to 12-14 units with breakfast, 14-16 units with lunch, and 14-16 units with supper.   check your blood sugar 4 times a day: before the 3 meals, and at bedtime.  also check if you have symptoms of your blood sugar being too high or too low.  please keep a record of the readings and bring it to your next appointment here (or you can bring the meter itself).  You can write it on any piece of paper.  please call us sooner if your blood sugar goes below 70, or if you have a lot of readings over 200.   Please come back for a follow-up appointment in 2-3 months.

## 2020-10-17 ENCOUNTER — Telehealth: Payer: Self-pay | Admitting: Endocrinology

## 2020-10-17 NOTE — Telephone Encounter (Signed)
Patient requests to be called at ph# [phone] Re;  Blood sugars running high-this morning blood sugars=361. Patient states he wants to go back to taking Basaglar.

## 2020-10-18 ENCOUNTER — Encounter: Payer: Self-pay | Admitting: Endocrinology

## 2020-10-20 ENCOUNTER — Encounter: Payer: Self-pay | Admitting: Endocrinology

## 2020-10-20 ENCOUNTER — Other Ambulatory Visit: Payer: Self-pay | Admitting: Endocrinology

## 2020-10-20 NOTE — Telephone Encounter (Signed)
Spoke with pt in the lobby regarding message from [place] regarding high BS.

## 2020-10-20 NOTE — Telephone Encounter (Signed)
Pt came to office and was advised of Dr's recommendation. See pt advise message.

## 2020-10-20 NOTE — Telephone Encounter (Signed)
Patient in lobby following up on telephone message from 10/17/20 and 09/27/222 MyChart message with high BS ranging between [phone] - this has been happening since taken off of Basaglar - patient wanting to speak with someone for assistance with blood sugars and RX - I am putting in phone note, but checking to see if anyone can assist him since he is in the lobby (waiting)  Call back number if necessary [phone]

## 2020-10-21 ENCOUNTER — Other Ambulatory Visit: Payer: Self-pay | Admitting: Endocrinology

## 2020-10-21 DIAGNOSIS — E1052 Type 1 diabetes mellitus with diabetic peripheral angiopathy with gangrene: Secondary | ICD-10-CM

## 2020-10-21 MED ORDER — BASAGLAR KWIKPEN 100 UNIT/ML ~~LOC~~ SOPN: 2.0000 [IU] | PEN_INJECTOR | Freq: Every day | SUBCUTANEOUS | 3 refills | Status: DC

## 2020-10-21 NOTE — Telephone Encounter (Signed)
Pt calling in to request refill Basaglar [place] to [facility] on [address]. Pt contact [phone]

## 2020-10-24 ENCOUNTER — Other Ambulatory Visit: Payer: Self-pay | Admitting: Endocrinology

## 2020-10-24 MED ORDER — LANTUS SOLOSTAR 100 UNIT/ML ~~LOC~~ SOPN: 2.0000 [IU] | PEN_INJECTOR | Freq: Every day | SUBCUTANEOUS | 99 refills | Status: DC

## 2020-10-24 NOTE — Telephone Encounter (Signed)
Basaglar is no longer covered under patient's ins. Would like Lantus sent to [facility] on [address]. Pt contact [phone]

## 2020-10-26 ENCOUNTER — Other Ambulatory Visit (HOSPITAL_COMMUNITY): Payer: Self-pay

## 2020-11-02 ENCOUNTER — Ambulatory Visit: Payer: BC Managed Care – PPO | Admitting: Endocrinology

## 2020-12-27 ENCOUNTER — Other Ambulatory Visit: Payer: Self-pay | Admitting: Orthopaedic Surgery

## 2021-01-05 ENCOUNTER — Ambulatory Visit: Payer: BC Managed Care – PPO | Admitting: Endocrinology

## 2021-01-10 ENCOUNTER — Other Ambulatory Visit: Payer: Self-pay | Admitting: Endocrinology

## 2021-01-25 ENCOUNTER — Ambulatory Visit: Payer: Self-pay | Admitting: Endocrinology

## 2021-02-15 ENCOUNTER — Ambulatory Visit: Payer: Self-pay | Admitting: Endocrinology

## 2021-03-08 ENCOUNTER — Other Ambulatory Visit: Payer: Self-pay | Admitting: Endocrinology

## 2021-03-08 ENCOUNTER — Telehealth: Payer: Self-pay

## 2021-03-08 MED ORDER — LANTUS SOLOSTAR 100 UNIT/ML ~~LOC~~ SOPN: 6.0000 [IU] | PEN_INJECTOR | Freq: Every day | SUBCUTANEOUS | 99 refills | Status: DC

## 2021-03-08 MED ORDER — INSULIN LISPRO (1 UNIT DIAL) 100 UNIT/ML (KWIKPEN): PEN_INJECTOR | SUBCUTANEOUS | 3 refills | Status: DC

## 2021-03-08 NOTE — Telephone Encounter (Signed)
Patient says that his pharmacy wont cover Novolog nor Basaglar and wants to know if theres an alternative long & short acting insulin that can be sent in. Please advise

## 2021-03-08 NOTE — Telephone Encounter (Signed)
No note needed 

## 2021-03-09 NOTE — Telephone Encounter (Signed)
Patient has now been informed of alternative rx's being sent in.

## 2021-03-13 ENCOUNTER — Other Ambulatory Visit (HOSPITAL_COMMUNITY): Payer: Self-pay

## 2021-03-16 ENCOUNTER — Other Ambulatory Visit: Payer: Self-pay

## 2021-03-16 ENCOUNTER — Encounter: Payer: Self-pay | Admitting: Endocrinology

## 2021-03-16 ENCOUNTER — Ambulatory Visit: Payer: Self-pay | Admitting: Endocrinology

## 2021-03-16 VITALS — BP 158/84 | HR 81 | Ht 72.0 in | Wt 177.0 lb

## 2021-03-16 DIAGNOSIS — E1052 Type 1 diabetes mellitus with diabetic peripheral angiopathy with gangrene: Secondary | ICD-10-CM

## 2021-03-16 LAB — POCT GLYCOSYLATED HEMOGLOBIN (HGB A1C): Hemoglobin A1C: 9.1 % — AB (ref 4.0–5.6)

## 2021-03-16 MED ORDER — DEXCOM G6 SENSOR MISC: 1.0000 | 3 refills | Status: DC

## 2021-03-16 MED ORDER — DEXCOM G6 TRANSMITTER MISC: 3 refills | Status: DC

## 2021-03-16 NOTE — Progress Notes (Signed)
Subjective:    Patient ID: Gordon Walker, male    DOB: July 28, 1965, 56 y.o.   MRN: [id number]  HPI Pt returns for f/u of diabetes mellitus:  DM type: 1 Dx'ed: 1998 Complications: PN, PAD, bilat LE amputations, CRI, and DR.  Therapy: insulin since dx.  DKA: never.   Severe hypoglycemia: last episode was 2017.   Pancreatitis: never.   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   Other: he takes MDI. he declines pump rx; he uses dexcom G-6 continuous glucose monitor; basal insulin was resumed 2022, due to high fasting cbg's he declines fructosamine. Interval history: he is not using continuous glucose monitor.  He requests refill.  He reports mild hypoglycemia approx BIW.  He now works 3rd shift.  He is uncertain why A1c is higher today.  Pt says he never misses insulin.   Past Medical History:  Diagnosis Date   Complication of anesthesia    anesthesia complication 01/18/16; pt unaware what happened    Dehiscence of amputation stump (HCC)    right   Diabetes mellitus without complication (HCC)    Type 1   Heart murmur    "years ago"   Hypertension    "years ago"   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, with long-term current use of insulin (HCC)     Past Surgical History:  Procedure Laterality Date   AMPUTATION Left 01/07/2015   Procedure: Left Foot 1st Ray Amputation;  Surgeon: Nadara Mustard, MD;  Location: [facility] OR;  Service: Orthopedics;  Laterality: Left;   AMPUTATION Left 07/15/2015   Procedure: Left Transmetatarsal Amputation;  Surgeon: Nadara Mustard, MD;  Location: [facility] OR;  Service: Orthopedics;  Laterality: Left;   AMPUTATION Right 12/30/2015   Procedure: 5th Ray Amputation Right Foot;  Surgeon: Nadara Mustard, MD;  Location: [facility] OR;  Service: Orthopedics;  Laterality: Right;   AMPUTATION Right 01/15/2016   Procedure: GUILLOTINE AMPUTATION  ABOVE THE ANKLE AMPUTATION;  Surgeon: Eldred Manges, MD;  Location: [facility] OR;  Service: Orthopedics;   Laterality: Right;   AMPUTATION Right 01/18/2016   Procedure: AMPUTATION BELOW KNEE;  Surgeon: Eldred Manges, MD;  Location: [facility] OR;  Service: Orthopedics;  Laterality: Right;   STUMP REVISION Right 03/21/2016   Procedure: Right Below Knee Amputation Stump Revision, WOUND VAC application;  Surgeon: Eldred Manges, MD;  Location: [facility] OR;  Service: Orthopedics;  Laterality: Right;    Social History   Socioeconomic History   Marital status: Married    Spouse name: Not on file   Number of children: Not on file   Years of education: Not on file   Highest education level: Not on file  Occupational History   Not on file  Tobacco Use   Smoking status: Never   Smokeless tobacco: Never  Substance and Sexual Activity   Alcohol use: No   Drug use: No   Sexual activity: Not on file  Other Topics Concern   Not on file  Social History Narrative   Not on file   Social Determinants of Health   Financial Resource Strain: Not on file  Food Insecurity: Not on file  Transportation Needs: Not on file  Physical Activity: Not on file  Stress: Not on file  Social Connections: Not on file  Intimate Partner Violence: Not on file    Current Outpatient Medications on File Prior to Visit  Medication Sig Dispense Refill   amLODipine (NORVASC) 5 MG tablet Take 1 tablet  by mouth daily.     aspirin EC 81 MG tablet Take 81 mg by mouth daily.     atorvastatin (LIPITOR) 20 MG tablet Take 1 tablet by mouth daily.     doxycycline (VIBRAMYCIN) 100 MG capsule Take 1 capsule by mouth twice daily 40 capsule 0   insulin glargine (LANTUS SOLOSTAR) 100 UNIT/ML Solostar Pen Inject 6 Units into the skin daily. 15 mL PRN   insulin lispro (HUMALOG KWIKPEN) 100 UNIT/ML KwikPen 16 units with breakfast, 16-18 units with lunch, and 16-18 units with supper, and pen needles 4/day 60 mL 3   Insulin Pen Needle 32G X 6 MM MISC 1 each by Does not apply route in the morning, at noon, and at bedtime. E10.52 270 each 3   lisinopril  (PRINIVIL,ZESTRIL) 10 MG tablet Take 1 tablet (10 mg total) by mouth daily. 30 tablet 0   Multiple Vitamin (MULTIVITAMIN WITH MINERALS) TABS tablet Take 1 tablet by mouth daily.     nitroGLYCERIN (NITRODUR - DOSED IN MG/24 HR) 0.2 mg/hr patch APPLY 1 PATCH TOPICALLY ONCE DAILY 30 patch 0   pantoprazole (PROTONIX) 40 MG tablet Take 1 tablet (40 mg total) by mouth daily. 30 tablet 3   SSD 1 % cream APPLY 1 CREAM EXTERNALLY ONCE DAILY 50 g 0   No current facility-administered medications on file prior to visit.    No Known Allergies  Family History  Problem Relation Age of Onset   Hypertension Mother    Diabetes Mother    Cancer Mother    Prostate cancer Father     BP (!) 158/84 (BP Location: Left Arm, Patient Position: Sitting, Cuff Size: Normal)    Pulse 81    Ht 6' (1.829 m)    Wt 177 lb (80.3 kg)    SpO2 100%    BMI 24.01 kg/m    Review of Systems     Objective:   Physical Exam    Lab Results  Component Value Date   CREATININE 1.2 09/17/2018   BUN 28 (A) 09/17/2018   NA 136 10/04/2016   K 4.3 10/04/2016   CL 103 10/04/2016   CO2 24 10/04/2016   A1c=9.1%    Assessment & Plan:  Type 1 DM: uncontrolled Hypoglycemia, due to insulin: we need continuous glucose monitor data in order to safely adjust insulin.   Patient Instructions  Your blood pressure is high today.  Please see your primary care provider soon, to have it rechecked.  I have sent a prescription to your pharmacy, for the sensors and transmitter.   check your blood sugar 4 times a day: before the 3 meals, and at bedtime.  also check if you have symptoms of your blood sugar being too high or too low.  please keep a record of the readings and bring it to your next appointment here (or you can bring the meter itself).  You can write it on any piece of paper.  please call us sooner if your blood sugar goes below 70, or if you have a lot of readings over 200.   Please come back for a follow-up appointment in 1  month.

## 2021-03-16 NOTE — Patient Instructions (Addendum)
Your blood pressure is high today.  Please see your primary care provider soon, to have it rechecked.  I have sent a prescription to your pharmacy, for the sensors and transmitter.   check your blood sugar 4 times a day: before the 3 meals, and at bedtime.  also check if you have symptoms of your blood sugar being too high or too low.  please keep a record of the readings and bring it to your next appointment here (or you can bring the meter itself).  You can write it on any piece of paper.  please call us sooner if your blood sugar goes below 70, or if you have a lot of readings over 200.   Please come back for a follow-up appointment in 1 month.

## 2021-03-17 ENCOUNTER — Other Ambulatory Visit (HOSPITAL_COMMUNITY): Payer: Self-pay

## 2021-03-17 ENCOUNTER — Telehealth: Payer: Self-pay

## 2021-03-17 NOTE — Telephone Encounter (Signed)
Patient Advocate Encounter  Prior Authorization for [employer] & sensors has been approved.    PA#  [id number]  Effective dates: 03/17/21 through 03/17/22  Per Test Claim Patients co-pay is $35 (sensors) and $87.50 Sales promotion account executive)  Spoke with Pharmacy to Process.  Patient Advocate Fax: [phone]

## 2021-03-28 ENCOUNTER — Telehealth: Payer: Self-pay

## 2021-03-28 NOTE — Telephone Encounter (Signed)
Can someone F/U with this to see if a PA is needed for the Dexcom g6 transmitter/sensors ?

## 2021-04-13 ENCOUNTER — Ambulatory Visit (INDEPENDENT_AMBULATORY_CARE_PROVIDER_SITE_OTHER): Payer: BC Managed Care – PPO | Admitting: Endocrinology

## 2021-04-13 ENCOUNTER — Encounter: Payer: Self-pay | Admitting: Endocrinology

## 2021-04-13 ENCOUNTER — Other Ambulatory Visit: Payer: Self-pay

## 2021-04-13 ENCOUNTER — Ambulatory Visit: Payer: BC Managed Care – PPO | Admitting: Endocrinology

## 2021-04-13 VITALS — BP 186/90 | HR 94 | Ht 72.0 in | Wt 186.4 lb

## 2021-04-13 DIAGNOSIS — E1052 Type 1 diabetes mellitus with diabetic peripheral angiopathy with gangrene: Secondary | ICD-10-CM | POA: Diagnosis not present

## 2021-04-13 MED ORDER — INSULIN LISPRO (1 UNIT DIAL) 100 UNIT/ML (KWIKPEN): PEN_INJECTOR | SUBCUTANEOUS | 3 refills | Status: DC

## 2021-04-13 NOTE — Progress Notes (Signed)
? ?Subjective:  ? ? Patient ID: Gordon Walker, male    DOB: 06/10/1965, 56 y.o.   MRN: [id number] ? ?HPI ?Pt returns for f/u of diabetes mellitus:  ?DM type: 1 ?Dx'ed: 1998 ?Complications: PN, PAD, bilat LE amputations, CRI, and DR.  ?Therapy: insulin since dx.  ?DKA: never.   ?Severe hypoglycemia: last episode was 2017.   ?Pancreatitis: never.   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   ?Other: he takes MDI. he declines pump rx; he uses dexcom G-6 continuous glucose monitor; basal insulin was resumed 2022, due to high fasting cbg's; he declines fructosamine. ?Interval history: I reviewed continuous glucose monitor data.  Glucose varies from 65-350.  It is in general highest at and 7PM.  It is lowest at 7AM and 5PM.  It decreases overnight.   He reports mild hypoglycemia approx BIW.  Pt says he never misses insulin.   ?Past Medical History:  ?Diagnosis Date  ? Complication of anesthesia   ? anesthesia complication 01/18/16; pt unaware what happened   ? Dehiscence of amputation stump (HCC)   ? right  ? Diabetes mellitus without complication (HCC)   ? Type 1  ? Heart murmur   ? "years ago"  ? Hypertension   ? "years ago"  ? 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, with long-term current use of insulin (HCC)   ? ? ?Past Surgical History:  ?Procedure Laterality Date  ? AMPUTATION Left 01/07/2015  ? Procedure: Left Foot 1st Ray Amputation;  Surgeon: Nadara Mustard, MD;  Location: [facility] OR;  Service: Orthopedics;  Laterality: Left;  ? AMPUTATION Left 07/15/2015  ? Procedure: Left Transmetatarsal Amputation;  Surgeon: Nadara Mustard, MD;  Location: [facility] OR;  Service: Orthopedics;  Laterality: Left;  ? AMPUTATION Right 12/30/2015  ? Procedure: 5th Ray Amputation Right Foot;  Surgeon: Nadara Mustard, MD;  Location: [facility] OR;  Service: Orthopedics;  Laterality: Right;  ? AMPUTATION Right 01/15/2016  ? Procedure: GUILLOTINE AMPUTATION  ABOVE THE ANKLE AMPUTATION;  Surgeon: Eldred Manges, MD;  Location:  [facility] OR;  Service: Orthopedics;  Laterality: Right;  ? AMPUTATION Right 01/18/2016  ? Procedure: AMPUTATION BELOW KNEE;  Surgeon: Eldred Manges, MD;  Location: [facility] OR;  Service: Orthopedics;  Laterality: Right;  ? STUMP REVISION Right 03/21/2016  ? Procedure: Right Below Knee Amputation Stump Revision, WOUND VAC application;  Surgeon: Eldred Manges, MD;  Location: [facility] OR;  Service: Orthopedics;  Laterality: Right;  ? ? ?Social History  ? ?Socioeconomic History  ? Marital status: Married  ?  Spouse name: Not on file  ? Number of children: Not on file  ? Years of education: Not on file  ? Highest education level: Not on file  ?Occupational History  ? Not on file  ?Tobacco Use  ? Smoking status: Never  ? Smokeless tobacco: Never  ?Substance and Sexual Activity  ? Alcohol use: No  ? Drug use: No  ? Sexual activity: Not on file  ?Other Topics Concern  ? Not on file  ?Social History Narrative  ? Not on file  ? ?Social Determinants of Health  ? ?Financial Resource Strain: Not on file  ?Food Insecurity: Not on file  ?Transportation Needs: Not on file  ?Physical Activity: Not on file  ?Stress: Not on file  ?Social Connections: Not on file  ?Intimate Partner Violence: Not on file  ? ? ?Current Outpatient Medications on File Prior to Visit  ?Medication Sig Dispense Refill  ?  amLODipine (NORVASC) 5 MG tablet Take 1 tablet by mouth daily.    ? aspirin EC 81 MG tablet Take 81 mg by mouth daily.    ? atorvastatin (LIPITOR) 20 MG tablet Take 1 tablet by mouth daily.    ? Continuous Blood Gluc Sensor (DEXCOM G6 SENSOR) MISC 1 Device by Other route See admin instructions. Change every 10 days. 9 each 3  ? Continuous Blood Gluc Transmit (DEXCOM G6 TRANSMITTER) MISC Use one transmitter once every 90 days to transmit blood sugars. 1 each 3  ? doxycycline (VIBRAMYCIN) 100 MG capsule Take 1 capsule by mouth twice daily 40 capsule 0  ? insulin glargine (LANTUS SOLOSTAR) 100 UNIT/ML Solostar Pen Inject 6 Units into the skin daily. 15 mL PRN  ?  Insulin Pen Needle 32G X 6 MM MISC 1 each by Does not apply route in the morning, at noon, and at bedtime. E10.52 270 each 3  ? lisinopril (PRINIVIL,ZESTRIL) 10 MG tablet Take 1 tablet (10 mg total) by mouth daily. 30 tablet 0  ? Multiple Vitamin (MULTIVITAMIN WITH MINERALS) TABS tablet Take 1 tablet by mouth daily.    ? nitroGLYCERIN (NITRODUR - DOSED IN MG/24 HR) 0.2 mg/hr patch APPLY 1 PATCH TOPICALLY ONCE DAILY 30 patch 0  ? pantoprazole (PROTONIX) 40 MG tablet Take 1 tablet (40 mg total) by mouth daily. 30 tablet 3  ? SSD 1 % cream APPLY 1 CREAM EXTERNALLY ONCE DAILY 50 g 0  ? ?No current facility-administered medications on file prior to visit.  ? ? ?No Known Allergies ? ?Family History  ?Problem Relation Age of Onset  ? Hypertension Mother   ? Diabetes Mother   ? Cancer Mother   ? Prostate cancer Father   ? ? ?BP (!) 186/90 (BP Location: Left Arm, Patient Position: Sitting, Cuff Size: Normal)   Pulse 94   Ht 6' (1.829 m)   Wt 186 lb 6.4 oz (84.6 kg)   SpO2 99%   BMI 25.28 kg/m?  ? ? ?Review of Systems ?Denies LOC.   ?   ?Objective:  ? Physical Exam ?VITAL SIGNS:  See vs page.   ?GENERAL: no distress.   ? ? ?   ?Assessment & Plan:  ?Type 1 DM: uncontrolled ? ?Patient Instructions  ?Your blood pressure is high today.  Please see your primary care provider soon, to have it rechecked.  ?Please increase the supper Humalog to 18-20 units ?check your blood sugar 4 times a day: before the 3 meals, and at bedtime.  also check if you have symptoms of your blood sugar being too high or too low.  please keep a record of the readings and bring it to your next appointment here (or you can bring the meter itself).  You can write it on any piece of paper.  please call us sooner if your blood sugar goes below 70, or if you have a lot of readings over 200.   ?Please come back for a follow-up appointment in 6 weeks.   ? ? ?

## 2021-04-13 NOTE — Patient Instructions (Addendum)
Your blood pressure is high today.  Please see your primary care provider soon, to have it rechecked.  ?Please increase the supper Humalog to 18-20 units ?check your blood sugar 4 times a day: before the 3 meals, and at bedtime.  also check if you have symptoms of your blood sugar being too high or too low.  please keep a record of the readings and bring it to your next appointment here (or you can bring the meter itself).  You can write it on any piece of paper.  please call us sooner if your blood sugar goes below 70, or if you have a lot of readings over 200.   ?Please come back for a follow-up appointment in 6 weeks.   ?

## 2021-05-19 ENCOUNTER — Other Ambulatory Visit: Payer: Self-pay | Admitting: Orthopaedic Surgery

## 2021-05-25 ENCOUNTER — Encounter: Payer: Self-pay | Admitting: Endocrinology

## 2021-05-25 ENCOUNTER — Ambulatory Visit: Payer: BC Managed Care – PPO | Admitting: Endocrinology

## 2021-05-25 VITALS — BP 144/70 | HR 76 | Ht 72.0 in | Wt 187.4 lb

## 2021-05-25 DIAGNOSIS — E1052 Type 1 diabetes mellitus with diabetic peripheral angiopathy with gangrene: Secondary | ICD-10-CM | POA: Diagnosis not present

## 2021-05-25 LAB — POCT GLYCOSYLATED HEMOGLOBIN (HGB A1C): Hemoglobin A1C: 7.4 % — AB (ref 4.0–5.6)

## 2021-05-25 MED ORDER — LANTUS SOLOSTAR 100 UNIT/ML ~~LOC~~ SOPN: 4.0000 [IU] | PEN_INJECTOR | Freq: Every day | SUBCUTANEOUS | 99 refills | Status: DC

## 2021-05-25 NOTE — Progress Notes (Signed)
? ?Subjective:  ? ? Patient ID: Gordon Walker, male    DOB: 1965-10-11, 56 y.o.   MRN: [id number] ? ?HPI ?Pt returns for f/u of diabetes mellitus:  ?DM type: 1 ?Dx'ed: 1998 ?Complications: PN, PAD, bilat LE amputations, CRI, and DR.  ?Therapy: insulin since dx.  ?DKA: never.   ?Severe hypoglycemia: last episode was 2017.   ?Pancreatitis: never.   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   ?Other: he takes MDI. he declines pump rx; he uses dexcom G-6 continuous glucose monitor; basal insulin was resumed 2022, due to high fasting cbg's; he declines fructosamine. ?Interval history: I reviewed continuous glucose monitor data.  Glucose varies from 48-350.  It is in general highest at [place].  It is lowest at 9AM.  It varies widely at 4AM (pt says this is due to working some nights (eating lunch at work), and having some nights off).   He again reports mild hypoglycemia approx BIW.  Pt says he never misses insulin.  ?Past Medical History:  ?Diagnosis Date  ? Complication of anesthesia   ? anesthesia complication [id number]; pt unaware what happened   ? Dehiscence of amputation stump (HCC)   ? right  ? Diabetes mellitus without complication ([place])   ? Type 1  ? Heart murmur   ? "years ago"  ? Hypertension   ? "years ago"  ? 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, with long-term current use of insulin (HCC)   ? ? ?Past Surgical History:  ?Procedure Laterality Date  ? AMPUTATION Left 01/07/2015  ? Procedure: Left Foot 1st Ray Amputation;  Surgeon: Newt Minion, MD;  Location: [place];  Service: Orthopedics;  Laterality: Left;  ? AMPUTATION Left 07/15/2015  ? Procedure: Left Transmetatarsal Amputation;  Surgeon: Newt Minion, MD;  Location: [place];  Service: Orthopedics;  Laterality: Left;  ? AMPUTATION Right 12/30/2015  ? Procedure: 5th Ray Amputation Right Foot;  Surgeon: Newt Minion, MD;  Location: [place];  Service: Orthopedics;  Laterality: Right;  ? AMPUTATION Right 01/15/2016  ?  Procedure: GUILLOTINE AMPUTATION  ABOVE THE ANKLE AMPUTATION;  Surgeon: Marybelle Killings, MD;  Location: [facility];  Service: Orthopedics;  Laterality: Right;  ? AMPUTATION Right 01/18/2016  ? Procedure: AMPUTATION BELOW KNEE;  Surgeon: Marybelle Killings, MD;  Location: [place];  Service: Orthopedics;  Laterality: Right;  ? STUMP REVISION Right 03/21/2016  ? Procedure: Right Below Knee Amputation Stump Revision, WOUND VAC application;  Surgeon: Marybelle Killings, MD;  Location: [place];  Service: Orthopedics;  Laterality: Right;  ? ? ?Social History  ? ?Socioeconomic History  ? Marital status: Married  ?  Spouse name: Not on file  ? Number of children: Not on file  ? Years of education: Not on file  ? Highest education level: Not on file  ?Occupational History  ? Not on file  ?Tobacco Use  ? Smoking status: Never  ? Smokeless tobacco: Never  ?Substance and Sexual Activity  ? Alcohol use: No  ? Drug use: No  ? Sexual activity: Not on file  ?Other Topics Concern  ? Not on file  ?Social History Narrative  ? Not on file  ? ?Social Determinants of Health  ? ?Financial Resource Strain: Not on file  ?Food Insecurity: Not on file  ?Transportation Needs: Not on file  ?Physical Activity: Not on file  ?Stress: Not on file  ?Social Connections: Not on file  ?Intimate Partner Violence: Not on file  ? ? ?  Current Outpatient Medications on File Prior to Visit  ?Medication Sig Dispense Refill  ? amLODipine (NORVASC) 5 MG tablet Take 1 tablet by mouth daily.    ? aspirin EC 81 MG tablet Take 81 mg by mouth daily.    ? atorvastatin (LIPITOR) 20 MG tablet Take 1 tablet by mouth daily.    ? Continuous Blood Gluc Sensor (DEXCOM G6 SENSOR) MISC 1 Device by Other route See admin instructions. Change every 10 days. 9 each 3  ? Continuous Blood Gluc Transmit (DEXCOM G6 TRANSMITTER) MISC Use one transmitter once every 90 days to transmit blood sugars. 1 each 3  ? doxycycline (VIBRAMYCIN) 100 MG capsule Take 1 capsule by mouth twice daily 40 capsule 0  ? insulin  lispro (HUMALOG KWIKPEN) 100 UNIT/ML KwikPen 16 units with breakfast, 16-18 units with lunch, and 18-20 units with supper, and pen needles 4/day 60 mL 3  ? Insulin Pen Needle 32G X 6 MM MISC 1 each by Does not apply route in the morning, at noon, and at bedtime. E10.52 270 each 3  ? lisinopril (PRINIVIL,ZESTRIL) 10 MG tablet Take 1 tablet (10 mg total) by mouth daily. 30 tablet 0  ? Multiple Vitamin (MULTIVITAMIN WITH MINERALS) TABS tablet Take 1 tablet by mouth daily.    ? nitroGLYCERIN (NITRODUR - DOSED IN MG/24 HR) 0.2 mg/hr patch APPLY 1 PATCH TOPICALLY ONCE DAILY 30 patch 0  ? pantoprazole (PROTONIX) 40 MG tablet Take 1 tablet (40 mg total) by mouth daily. 30 tablet 3  ? SSD 1 % cream APPLY  CREAM EXTERNALLY TO AFFECTED AREA ONCE DAILY 50 g 0  ? ?No current facility-administered medications on file prior to visit.  ? ? ?No Known Allergies ? ?Family History  ?Problem Relation Age of Onset  ? Hypertension Mother   ? Diabetes Mother   ? Cancer Mother   ? Prostate cancer Father   ? ? ?BP (!) 144/70 (BP Location: Left Arm, Patient Position: Sitting, Cuff Size: Normal)   Pulse 76   Ht 6' (1.829 m)   Wt 187 lb 6.4 oz (85 kg)   SpO2 99%   BMI 25.42 kg/m?  ? ? ?Review of Systems ? ?   ?Objective:  ? Physical Exam ?VITAL SIGNS:  See vs page.   ?GENERAL: no distress.   ? ? ? ?Lab Results  ?Component Value Date  ? HGBA1C 7.4 (A) 05/25/2021  ? ?   ?Assessment & Plan:  ?Type 1 DM ?Hypoglycemia, due to insulin: this limits aggressiveness of glycemic control ? ?Patient Instructions  ?Your blood pressure is high today.  Please see your primary care provider soon, to have it rechecked.  ?Please reduce the Lantus to 4 units daily ?check your blood sugar 4 times a day: before the 3 meals, and at bedtime.  also check if you have symptoms of your blood sugar being too high or too low.  please keep a record of the readings and bring it to your next appointment here (or you can bring the meter itself).  You can write it on any  piece of paper.  please call us sooner if your blood sugar goes below 70, or if you have a lot of readings over 200.   ?You should have an endocrinology follow-up appointment in 3 months.   ? ? ?

## 2021-05-25 NOTE — Patient Instructions (Addendum)
Your blood pressure is high today.  Please see your primary care provider soon, to have it rechecked.  ?Please reduce the Lantus to 4 units daily ?check your blood sugar 4 times a day: before the 3 meals, and at bedtime.  also check if you have symptoms of your blood sugar being too high or too low.  please keep a record of the readings and bring it to your next appointment here (or you can bring the meter itself).  You can write it on any piece of paper.  please call us sooner if your blood sugar goes below 70, or if you have a lot of readings over 200.   ?You should have an endocrinology follow-up appointment in 3 months.   ?

## 2021-06-22 ENCOUNTER — Encounter: Payer: Self-pay | Admitting: Emergency Medicine

## 2021-06-22 ENCOUNTER — Ambulatory Visit: Payer: BC Managed Care – PPO | Admitting: Emergency Medicine

## 2021-06-22 VITALS — BP 140/82 | HR 72 | Temp 97.9°F | Ht 73.0 in | Wt 190.2 lb

## 2021-06-22 DIAGNOSIS — I10 Essential (primary) hypertension: Secondary | ICD-10-CM | POA: Diagnosis not present

## 2021-06-22 DIAGNOSIS — Z23 Encounter for immunization: Secondary | ICD-10-CM | POA: Diagnosis not present

## 2021-06-22 DIAGNOSIS — E1052 Type 1 diabetes mellitus with diabetic peripheral angiopathy with gangrene: Secondary | ICD-10-CM

## 2021-06-22 DIAGNOSIS — E1059 Type 1 diabetes mellitus with other circulatory complications: Secondary | ICD-10-CM | POA: Diagnosis not present

## 2021-06-22 DIAGNOSIS — Z7689 Persons encountering health services in other specified circumstances: Secondary | ICD-10-CM

## 2021-06-22 DIAGNOSIS — I152 Hypertension secondary to endocrine disorders: Secondary | ICD-10-CM

## 2021-06-22 LAB — CBC WITH DIFFERENTIAL/PLATELET
Basophils Absolute: 0 10*3/uL (ref 0.0–0.1)
Basophils Relative: 0.5 % (ref 0.0–3.0)
Eosinophils Absolute: 0.1 10*3/uL (ref 0.0–0.7)
Eosinophils Relative: 1.8 % (ref 0.0–5.0)
HCT: 37.6 % — ABNORMAL LOW (ref 39.0–52.0)
Hemoglobin: 12 g/dL — ABNORMAL LOW (ref 13.0–17.0)
Lymphocytes Relative: 36.6 % (ref 12.0–46.0)
Lymphs Abs: 1.8 10*3/uL (ref 0.7–4.0)
MCHC: 32 g/dL (ref 30.0–36.0)
MCV: 84.3 fl (ref 78.0–100.0)
Monocytes Absolute: 0.4 10*3/uL (ref 0.1–1.0)
Monocytes Relative: 7.6 % (ref 3.0–12.0)
Neutro Abs: 2.6 10*3/uL (ref 1.4–7.7)
Neutrophils Relative %: 53.5 % (ref 43.0–77.0)
Platelets: 321 10*3/uL (ref 150.0–400.0)
RBC: 4.46 Mil/uL (ref 4.22–5.81)
RDW: 14.6 % (ref 11.5–15.5)
WBC: 4.9 10*3/uL (ref 4.0–10.5)

## 2021-06-22 LAB — COMPREHENSIVE METABOLIC PANEL
ALT: 29 U/L (ref 0–53)
AST: 31 U/L (ref 0–37)
Albumin: 3.7 g/dL (ref 3.5–5.2)
Alkaline Phosphatase: 99 U/L (ref 39–117)
BUN: 20 mg/dL (ref 6–23)
CO2: 28 mEq/L (ref 19–32)
Calcium: 9.6 mg/dL (ref 8.4–10.5)
Chloride: 100 mEq/L (ref 96–112)
Creatinine, Ser: 1.18 mg/dL (ref 0.40–1.50)
GFR: 70.32 mL/min (ref >60.00)
Glucose, Bld: 202 mg/dL — ABNORMAL HIGH (ref 70–99)
Potassium: 3.8 mEq/L (ref 3.5–5.1)
Sodium: 136 mEq/L (ref 135–145)
Total Bilirubin: 0.6 mg/dL (ref 0.2–1.2)
Total Protein: 7.6 g/dL (ref 6.0–8.3)

## 2021-06-22 LAB — LIPID PANEL
Cholesterol: 172 mg/dL (ref 0–200)
HDL: 75.4 mg/dL (ref >39.00)
LDL Cholesterol: 75 mg/dL (ref 0–99)
NonHDL: 96.49
Total CHOL/HDL Ratio: 2
Triglycerides: 109 mg/dL (ref 0.0–149.0)
VLDL: 21.8 mg/dL (ref 0.0–40.0)

## 2021-06-22 MED ORDER — ATORVASTATIN CALCIUM 20 MG PO TABS
20.0000 mg | ORAL_TABLET | Freq: Every day | ORAL | 3 refills | Status: DC
Start: 2021-06-22 — End: 2022-06-08

## 2021-06-22 MED ORDER — AMLODIPINE BESYLATE 5 MG PO TABS: 5.0000 mg | ORAL_TABLET | Freq: Every day | ORAL | 3 refills | Status: DC

## 2021-06-22 MED ORDER — LISINOPRIL 10 MG PO TABS: 10.0000 mg | ORAL_TABLET | Freq: Every day | ORAL | 0 refills | Status: DC

## 2021-06-22 NOTE — Assessment & Plan Note (Signed)
Uncontrolled hypertension.  Off medications for at least 6 months. Restart amlodipine 5 mg and lisinopril 10 mg daily. Advised to monitor blood pressure readings at home daily for the next several weeks and keep a log. Cardiovascular risks associated with uncontrolled hypertension discussed with patient. Diabetes seems to be well controlled, insulin-dependent.  Management handled by endocrinologist. Continue atorvastatin 20 mg daily Diet and nutrition discussed. Blood work including lipid profile done today.

## 2021-06-22 NOTE — Patient Instructions (Signed)
Hypertension, Adult High blood pressure (hypertension) is when the force of blood pumping through the arteries is too strong. The arteries are the blood vessels that carry blood from the heart throughout the body. Hypertension forces the heart to work harder to pump blood and may cause arteries to become narrow or stiff. Untreated or uncontrolled hypertension can lead to a heart attack, heart failure, a stroke, kidney disease, and other problems. A blood pressure reading consists of a higher number over a lower number. Ideally, your blood pressure should be below 120/80. The first ("top") number is called the systolic pressure. It is a measure of the pressure in your arteries as your heart beats. The second ("bottom") number is called the diastolic pressure. It is a measure of the pressure in your arteries as the heart relaxes. What are the causes? The exact cause of this condition is not known. There are some conditions that result in high blood pressure. What increases the risk? Certain factors may make you more likely to develop high blood pressure. Some of these risk factors are under your control, including: Smoking. Not getting enough exercise or physical activity. Being overweight. Having too much fat, sugar, calories, or salt (sodium) in your diet. Drinking too much alcohol. Other risk factors include: Having a personal history of heart disease, diabetes, high cholesterol, or kidney disease. Stress. Having a family history of high blood pressure and high cholesterol. Having obstructive sleep apnea. Age. The risk increases with age. What are the signs or symptoms? High blood pressure may not cause symptoms. Very high blood pressure (hypertensive crisis) may cause: Headache. Fast or irregular heartbeats (palpitations). Shortness of breath. Nosebleed. Nausea and vomiting. Vision changes. Severe chest pain, dizziness, and seizures. How is this diagnosed? This condition is diagnosed by  measuring your blood pressure while you are seated, with your arm resting on a flat surface, your legs uncrossed, and your feet flat on the floor. The cuff of the blood pressure monitor will be placed directly against the skin of your upper arm at the level of your heart. Blood pressure should be measured at least twice using the same arm. Certain conditions can cause a difference in blood pressure between your right and left arms. If you have a high blood pressure reading during one visit or you have normal blood pressure with other risk factors, you may be asked to: Return on a different day to have your blood pressure checked again. Monitor your blood pressure at home for 1 week or longer. If you are diagnosed with hypertension, you may have other blood or imaging tests to help your health care provider understand your overall risk for other conditions. How is this treated? This condition is treated by making healthy lifestyle changes, such as eating healthy foods, exercising more, and reducing your alcohol intake. You may be referred for counseling on a healthy diet and physical activity. Your health care provider may prescribe medicine if lifestyle changes are not enough to get your blood pressure under control and if: Your systolic blood pressure is above 130. Your diastolic blood pressure is above 80. Your personal target blood pressure may vary depending on your medical conditions, your age, and other factors. Follow these instructions at home: Eating and drinking  Eat a diet that is high in fiber and potassium, and low in sodium, added sugar, and fat. An example of this eating plan is called the DASH diet. DASH stands for Dietary Approaches to Stop Hypertension. To eat this way: Eat   plenty of fresh fruits and vegetables. Try to fill one half of your plate at each meal with fruits and vegetables. Eat whole grains, such as whole-wheat pasta, brown rice, or whole-grain bread. Fill about one  fourth of your plate with whole grains. Eat or drink low-fat dairy products, such as skim milk or low-fat yogurt. Avoid fatty cuts of meat, processed or cured meats, and poultry with skin. Fill about one fourth of your plate with lean proteins, such as fish, chicken without skin, beans, eggs, or tofu. Avoid pre-made and processed foods. These tend to be higher in sodium, added sugar, and fat. Reduce your daily sodium intake. Many people with hypertension should eat less than 1,500 mg of sodium a day. Do not drink alcohol if: Your health care provider tells you not to drink. You are pregnant, may be pregnant, or are planning to become pregnant. If you drink alcohol: Limit how much you have to: 0-1 drink a day for women. 0-2 drinks a day for men. Know how much alcohol is in your drink. In the [place] one drink equals one 12 oz bottle of beer (355 mL), one 5 oz glass of wine (148 mL), or one 1 oz glass of hard liquor (44 mL). Lifestyle  Work with your health care provider to maintain a healthy body weight or to lose weight. Ask what an ideal weight is for you. Get at least 30 minutes of exercise that causes your heart to beat faster (aerobic exercise) most days of the week. Activities may include walking, swimming, or biking. Include exercise to strengthen your muscles (resistance exercise), such as Pilates or lifting weights, as part of your weekly exercise routine. Try to do these types of exercises for 30 minutes at least 3 days a week. Do not use any products that contain nicotine or tobacco. These products include cigarettes, chewing tobacco, and vaping devices, such as e-cigarettes. If you need help quitting, ask your health care provider. Monitor your blood pressure at home as told by your health care provider. Keep all follow-up visits. This is important. Medicines Take over-the-counter and prescription medicines only as told by your health care provider. Follow directions carefully. Blood  pressure medicines must be taken as prescribed. Do not skip doses of blood pressure medicine. Doing this puts you at risk for problems and can make the medicine less effective. Ask your health care provider about side effects or reactions to medicines that you should watch for. Contact a health care provider if you: Think you are having a reaction to a medicine you are taking. Have headaches that keep coming back (recurring). Feel dizzy. Have swelling in your ankles. Have trouble with your vision. Get help right away if you: Develop a severe headache or confusion. Have unusual weakness or numbness. Feel faint. Have severe pain in your chest or abdomen. Vomit repeatedly. Have trouble breathing. These symptoms may be an emergency. Get help right away. Call 911. Do not wait to see if the symptoms will go away. Do not drive yourself to the hospital. Summary Hypertension is when the force of blood pumping through your arteries is too strong. If this condition is not controlled, it may put you at risk for serious complications. Your personal target blood pressure may vary depending on your medical conditions, your age, and other factors. For most people, a normal blood pressure is less than 120/80. Hypertension is treated with lifestyle changes, medicines, or a combination of both. Lifestyle changes include losing weight, eating a healthy,   low-sodium diet, exercising more, and limiting alcohol. This information is not intended to replace advice given to you by your health care provider. Make sure you discuss any questions you have with your health care provider. Document Revised: 11/15/2020 Document Reviewed: 11/15/2020 [employer] Patient Education  2023 [employer]  

## 2021-06-22 NOTE — Progress Notes (Signed)
Gordon Walker 56 y.o.   Chief Complaint  Patient presents with   New Patient (Initial Visit)    HISTORY OF PRESENT ILLNESS: This is a 56 y.o. male first visit to this office, here to establish care with me. History of diabetes insulin-dependent.  Sees endocrinologist Dr. Everardo AllEllison on a regular basis. History of hypertension, off medications for 6 months.  Was taking lisinopril and amlodipine.  Needs medication refills. No other complaints or medical concerns today. BP Readings from Last 3 Encounters:  05/25/21 (!) 144/70  04/13/21 (!) 186/90  03/16/21 (!) 158/84   Last endocrinologist office visit notes, assessment and plan as follows: Assessment & Plan:  Type 1 DM Hypoglycemia, due to insulin: this limits aggressiveness of glycemic control   Patient Instructions  Your blood pressure is high today.  Please see your primary care provider soon, to have it rechecked.  Please reduce the Lantus to 4 units daily check your blood sugar 4 times a day: before the 3 meals, and at bedtime.  also check if you have symptoms of your blood sugar being too high or too low.  please keep a record of the readings and bring it to your next appointment here (or you can bring the meter itself).  You can write it on any piece of paper.  please call us sooner if your blood sugar goes below 70, or if you have a lot of readings over 200.   You should have an endocrinology follow-up appointment in 3 months.    HPI   Prior to Admission medications   Medication Sig Start Date End Date Taking? Authorizing Provider  amLODipine (NORVASC) 5 MG tablet Take 1 tablet by mouth daily. 05/30/20  Yes [provider]  aspirin EC 81 MG tablet Take 81 mg by mouth daily.   Yes [provider]  atorvastatin (LIPITOR) 20 MG tablet Take 1 tablet by mouth daily. 04/07/20  Yes [provider]  Continuous Blood Gluc Sensor (DEXCOM G6 SENSOR) MISC 1 Device by Other route See admin instructions. Change  every 10 days. 03/16/21  Yes Romero BellingEllison, Sean, MD  Continuous Blood Gluc Transmit (DEXCOM G6 TRANSMITTER) MISC Use one transmitter once every 90 days to transmit blood sugars. 03/16/21  Yes Romero BellingEllison, Sean, MD  doxycycline (VIBRAMYCIN) 100 MG capsule Take 1 capsule by mouth twice daily 12/28/20  Yes Eldred MangesYates, Mark C, MD  insulin glargine (LANTUS SOLOSTAR) 100 UNIT/ML Solostar Pen Inject 4 Units into the skin daily. 05/25/21  Yes Romero BellingEllison, Sean, MD  insulin lispro (HUMALOG KWIKPEN) 100 UNIT/ML KwikPen 16 units with breakfast, 16-18 units with lunch, and 18-20 units with supper, and pen needles 4/day 04/13/21  Yes Romero BellingEllison, Sean, MD  Insulin Pen Needle 32G X 6 MM MISC 1 each by Does not apply route in the morning, at noon, and at bedtime. E10.52 06/05/19  Yes Romero BellingEllison, Sean, MD  lisinopril (PRINIVIL,ZESTRIL) 10 MG tablet Take 1 tablet (10 mg total) by mouth daily. 01/27/16  Yes Angiulli, Mcarthur Rossettianiel J, PA-C  Multiple Vitamin (MULTIVITAMIN WITH MINERALS) TABS tablet Take 1 tablet by mouth daily.   Yes [provider]  SSD 1 % cream APPLY  CREAM EXTERNALLY TO AFFECTED AREA ONCE DAILY 05/19/21  Yes Eldred MangesYates, Mark C, MD  nitroGLYCERIN (NITRODUR - DOSED IN MG/24 HR) 0.2 mg/hr patch APPLY 1 PATCH TOPICALLY ONCE DAILY Patient not taking: Reported on 06/22/2021 02/11/20   Persons, [place] BaliMary Anne, PA  pantoprazole (PROTONIX) 40 MG tablet Take 1 tablet (40 mg total) by mouth daily. 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   Hyponatremia 01/17/2016   AKI (acute kidney injury) (HCC)  01/17/2016   Hyperbilirubinemia 01/17/2016   Bacteremia due to Escherichia coli 01/17/2016   Sepsis (HCC) 01/17/2016   DKA (diabetic ketoacidoses) 01/15/2016   Controlled diabetes mellitus with diabetic peripheral angiopathy and gangrene (HCC)    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, with long-term current use of insulin (HCC)    Transaminitis 01/07/2015   Osteomyelitis of left foot (HCC) 01/06/2015   Anemia 01/06/2015   Osteomyelitis (HCC) 01/05/2015    Past Medical History:  Diagnosis Date   Complication of anesthesia    anesthesia complication 01/18/16; pt unaware what happened    Dehiscence of amputation stump (HCC)    right   Diabetes mellitus without complication (HCC)    Type 1   Heart murmur    "years ago"   Hypertension    "years ago"   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, with long-term current use of insulin (HCC)     Past Surgical History:  Procedure Laterality Date   AMPUTATION Left 01/07/2015   Procedure: Left Foot 1st Ray Amputation;  Surgeon: Nadara Mustard, MD;  Location: [facility] OR;  Service: Orthopedics;  Laterality: Left;   AMPUTATION Left 07/15/2015   Procedure: Left Transmetatarsal Amputation;  Surgeon: Nadara Mustard, MD;  Location: [facility] OR;  Service: Orthopedics;  Laterality: Left;   AMPUTATION Right 12/30/2015   Procedure: 5th Ray Amputation Right Foot;  Surgeon: Nadara Mustard, MD;  Location: [facility] OR;  Service: Orthopedics;  Laterality: Right;   AMPUTATION Right 01/15/2016   Procedure: GUILLOTINE AMPUTATION  ABOVE THE ANKLE AMPUTATION;  Surgeon: Eldred Manges, MD;  Location: [facility] OR;  Service: Orthopedics;  Laterality: Right;   AMPUTATION Right 01/18/2016   Procedure: AMPUTATION BELOW KNEE;  Surgeon: Eldred Manges, MD;  Location: [facility] OR;  Service: Orthopedics;  Laterality: Right;   STUMP REVISION Right 03/21/2016   Procedure: Right Below Knee Amputation Stump Revision, WOUND VAC application;   Surgeon: Eldred Manges, MD;  Location: [facility] OR;  Service: Orthopedics;  Laterality: Right;    Social History   Socioeconomic History   Marital status: Married    Spouse name: Not on file   Number of children: Not on file   Years of education: Not on file   Highest education level: Not on file  Occupational History   Not on file  Tobacco Use   Smoking status: Never   Smokeless tobacco: Never  Substance and Sexual Activity   Alcohol use: No   Drug use: No   Sexual activity: Not on file  Other Topics Concern   Not on file  Social History Narrative   Not on file   Social Determinants of Health   Financial Resource Strain: Not on file  Food Insecurity: Not on file  Transportation Needs: Not on file  Physical Activity: Not on file  Stress: Not on file  Social Connections: Not on file  Intimate Partner Violence: Not on file    Family History  Problem Relation Age of Onset   Hypertension Mother    Diabetes Mother    Cancer Mother    Prostate cancer Father      Review of Systems  Constitutional: Negative.  Negative for chills and fever.  HENT: Negative.  Negative for congestion and sore throat.   Respiratory: Negative.  Negative for cough and shortness of breath.   Cardiovascular: Negative.  Negative for chest pain and palpitations.  Gastrointestinal:  Negative for abdominal pain, diarrhea, nausea and vomiting.  Genitourinary: Negative.   Skin: Negative.  Negative for rash.  Neurological:  Negative for dizziness and headaches.  All other systems reviewed and are negative.  Vitals:   06/22/21 1011  BP: 140/82  Pulse: 72  Temp: 97.9 F (36.6 C)  SpO2: 98%    Physical Exam Vitals reviewed.  Constitutional:      Appearance: Normal appearance.  HENT:     Head: Normocephalic.  Eyes:     Extraocular Movements: Extraocular movements intact.     Conjunctiva/sclera: Conjunctivae normal.     Pupils: Pupils are equal, round, and reactive to light.  Cardiovascular:      Rate and Rhythm: Normal rate and regular rhythm.     Pulses: Normal pulses.     Heart sounds: Normal heart sounds.  Pulmonary:     Effort: Pulmonary effort is normal.     Breath sounds: Normal breath sounds.  Abdominal:     Palpations: Abdomen is soft.     Tenderness: There is no abdominal tenderness.  Musculoskeletal:     Cervical back: No tenderness.  Lymphadenopathy:     Cervical: No cervical adenopathy.  Skin:    General: Skin is warm and dry.     Capillary Refill: Capillary refill takes less than 2 seconds.  Neurological:     General: No focal deficit present.     Mental Status: He is alert and oriented to person, place, and time.  Psychiatric:        Mood and Affect: Mood normal.        Behavior: Behavior normal.     ASSESSMENT & PLAN: A total of 49 minutes was spent with the patient and counseling/coordination of care regarding preparing for this visit, review of available medical records including endocrinologist last office visit notes, review of multiple chronic medical problems under management, review of all medications, diagnosis and treatment of hypertension, cardiovascular risk associated with uncontrolled hypertension, review of most recent blood work results and need for blood work today, education on nutrition, prognosis, documentation and need for follow-up.  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  Off medications for at least 6 months. Restart amlodipine 5 mg and lisinopril 10 mg daily. Advised to monitor blood pressure readings at home daily for the next several weeks and keep a  log. Cardiovascular risks associated with uncontrolled hypertension discussed with patient. Diabetes seems to be well controlled, insulin-dependent.  Management handled by endocrinologist. Continue atorvastatin 20 mg daily Diet and nutrition discussed. Blood work including lipid  profile done today.       Relevant Medications   amLODipine (NORVASC) 5 MG tablet   atorvastatin (LIPITOR) 20 MG tablet   lisinopril (ZESTRIL) 10 MG tablet   Other Relevant Orders   Comprehensive metabolic panel   CBC with Differential/Platelet   Lipid panel   Other Visit Diagnoses     Need for vaccination       Relevant Orders   Varicella-zoster vaccine IM (Shingrix)   Essential hypertension       Relevant Medications   amLODipine (NORVASC) 5 MG tablet   atorvastatin (LIPITOR) 20 MG tablet   lisinopril (ZESTRIL) 10 MG tablet   Encounter to establish care          Patient Instructions  Hypertension, Adult High blood pressure (hypertension) is when the force of blood pumping through the arteries is too strong. The arteries are the blood vessels that carry blood from the heart throughout the body. Hypertension forces the heart to work harder to pump blood and may cause arteries to become narrow or stiff. Untreated or uncontrolled hypertension can lead to a heart attack, heart failure, a stroke, kidney disease, and other problems. A blood pressure reading consists of a higher number over a lower number. Ideally, your blood pressure should be below 120/80. The first ("top") number is called the systolic pressure. It is a measure of the pressure in your arteries as your heart beats. The second ("bottom") number is called the diastolic pressure. It is a measure of the pressure in your arteries as the heart relaxes. What are the causes? The exact cause of this condition is not known. There are some conditions that result in high blood pressure. What increases the risk? Certain factors may make you more likely  to develop high blood pressure. Some of these risk factors are under your control, including: Smoking. Not getting enough exercise or physical activity. Being overweight. Having too much fat, sugar, calories, or salt (sodium) in your diet. Drinking too much alcohol. Other risk factors include: Having a personal history of heart disease, diabetes, high cholesterol, or kidney disease. Stress. Having a family history of high blood pressure and high cholesterol. Having obstructive sleep apnea. Age. The risk increases with age. What are the signs or symptoms? High blood pressure may not cause symptoms. Very high blood pressure (hypertensive crisis) may cause: Headache. Fast or irregular heartbeats (palpitations). Shortness of breath. Nosebleed. Nausea and vomiting. Vision changes. Severe chest pain, dizziness, and seizures. How is this diagnosed? This condition is diagnosed by measuring your blood pressure while you are seated, with your arm resting on a flat surface, your legs uncrossed, and your feet flat on the floor. The cuff of the blood pressure monitor will be placed directly against the skin of your upper arm at the level of your heart. Blood pressure should be measured at least twice using the same arm. Certain conditions can cause a difference in blood pressure between your right and left arms. If you have a high blood pressure reading during one visit or you have normal blood pressure with other risk factors, you may be asked to: Return on a different day to have your blood pressure checked again. Monitor your blood pressure at home for 1 week or longer. If you are diagnosed with hypertension, you may have other blood or imaging tests to help your health care provider understand your overall risk for other conditions. How is this treated? This condition is treated by making healthy lifestyle changes, such  as eating healthy foods, exercising more, and reducing your alcohol intake.  You may be referred for counseling on a healthy diet and physical activity. Your health care provider may prescribe medicine if lifestyle changes are not enough to get your blood pressure under control and if: Your systolic blood pressure is above 130. Your diastolic blood pressure is above 80. Your personal target blood pressure may vary depending on your medical conditions, your age, and other factors. Follow these instructions at home: Eating and drinking  Eat a diet that is high in fiber and potassium, and low in sodium, added sugar, and fat. An example of this eating plan is called the DASH diet. DASH stands for Dietary Approaches to Stop Hypertension. To eat this way: Eat plenty of fresh fruits and vegetables. Try to fill one half of your plate at each meal with fruits and vegetables. Eat whole grains, such as whole-wheat pasta, brown rice, or whole-grain bread. Fill about one fourth of your plate with whole grains. Eat or drink low-fat dairy products, such as skim milk or low-fat yogurt. Avoid fatty cuts of meat, processed or cured meats, and poultry with skin. Fill about one fourth of your plate with lean proteins, such as fish, chicken without skin, beans, eggs, or tofu. Avoid pre-made and processed foods. These tend to be higher in sodium, added sugar, and fat. Reduce your daily sodium intake. Many people with hypertension should eat less than 1,500 mg of sodium a day. Do not drink alcohol if: Your health care provider tells you not to drink. You are pregnant, may be pregnant, or are planning to become pregnant. If you drink alcohol: Limit how much you have to: 0-1 drink a day for women. 0-2 drinks a day for men. Know how much alcohol is in your drink. In the [place] one drink equals one 12 oz bottle of beer (355 mL), one 5 oz glass of wine (148 mL), or one 1 oz glass of hard liquor (44 mL). Lifestyle  Work with your health care provider to maintain a healthy body weight or to lose  weight. Ask what an ideal weight is for you. Get at least 30 minutes of exercise that causes your heart to beat faster (aerobic exercise) most days of the week. Activities may include walking, swimming, or biking. Include exercise to strengthen your muscles (resistance exercise), such as Pilates or lifting weights, as part of your weekly exercise routine. Try to do these types of exercises for 30 minutes at least 3 days a week. Do not use any products that contain nicotine or tobacco. These products include cigarettes, chewing tobacco, and vaping devices, such as e-cigarettes. If you need help quitting, ask your health care provider. Monitor your blood pressure at home as told by your health care provider. Keep all follow-up visits. This is important. Medicines Take over-the-counter and prescription medicines only as told by your health care provider. Follow directions carefully. Blood pressure medicines must be taken as prescribed. Do not skip doses of blood pressure medicine. Doing this puts you at risk for problems and can make the medicine less effective. Ask your health care provider about side effects or reactions to medicines that you should watch for. Contact a health care provider if you: Think you are having a reaction to a medicine you are taking. Have headaches that keep coming back (recurring). Feel dizzy. Have swelling in your ankles. Have trouble with your vision. Get help right away if you: Develop a severe headache or confusion.  Have unusual weakness or numbness. Feel faint. Have severe pain in your chest or abdomen. Vomit repeatedly. Have trouble breathing. These symptoms may be an emergency. Get help right away. Call 911. Do not wait to see if the symptoms will go away. Do not drive yourself to the hospital. Summary Hypertension is when the force of blood pumping through your arteries is too strong. If this condition is not controlled, it may put you at risk for serious  complications. Your personal target blood pressure may vary depending on your medical conditions, your age, and other factors. For most people, a normal blood pressure is less than 120/80. Hypertension is treated with lifestyle changes, medicines, or a combination of both. Lifestyle changes include losing weight, eating a healthy, low-sodium diet, exercising more, and limiting alcohol. This information is not intended to replace advice given to you by your health care provider. Make sure you discuss any questions you have with your health care provider. Document Revised: 11/15/2020 Document Reviewed: 11/15/2020 Elsevier Patient Education  2023 Elsevier Inc.    Edwina Barth, MD [facility]

## 2021-06-28 ENCOUNTER — Encounter: Payer: Self-pay | Admitting: Endocrinology

## 2021-07-07 ENCOUNTER — Other Ambulatory Visit: Payer: Self-pay | Admitting: Orthopaedic Surgery

## 2021-07-12 ENCOUNTER — Other Ambulatory Visit: Payer: Self-pay | Admitting: Emergency Medicine

## 2021-07-12 DIAGNOSIS — I10 Essential (primary) hypertension: Secondary | ICD-10-CM

## 2021-07-27 ENCOUNTER — Other Ambulatory Visit: Payer: Self-pay | Admitting: Orthopaedic Surgery

## 2021-08-21 ENCOUNTER — Telehealth: Payer: Self-pay | Admitting: *Deleted

## 2021-08-21 NOTE — Chronic Care Management (AMB) (Signed)
  Care Coordination   Note   08/21/2021 Name: Gordon Walker MRN: [id number] DOB: 1965-04-26  Gordon Walker is a 56 y.o. year old male who sees Sagardia, Eilleen Kempf, MD for primary care. I reached out to Charletta Cousin by phone today to offer care coordination services.  [name] was given information about Care Coordination services today including:   The Care Coordination services include support from the care team which includes your Nurse Coordinator, Clinical Social Worker, or Pharmacist.  The Care Coordination team is here to help remove barriers to the health concerns and goals most important to you. Care Coordination services are voluntary, and the patient may decline or stop services at any time by request to their care team member.   Care Coordination Consent Status: Patient agreed to services and verbal consent obtained.   Follow up plan:  Telephone appointment with care coordination team member scheduled for:  08/25/2021  Encounter Outcome:  Pt. Scheduled  Burman Nieves, CCMA Care Coordination Care Guide Direct Dial: [phone]

## 2021-08-25 ENCOUNTER — Ambulatory Visit: Payer: Self-pay

## 2021-08-25 NOTE — Patient Outreach (Signed)
  Care Coordination   08/25/2021 Name: Chaim Gatley MRN: [id number] DOB: [birth year]   Care Coordination Outreach Attempts:  An unsuccessful telephone outreach was attempted today to offer the patient information about available care coordination services as a benefit of their health plan.   Follow Up Plan:  Additional outreach attempts will be made to offer the patient care coordination information and services.   Encounter Outcome:  No Answer  Care Coordination Interventions Activated:  No   Care Coordination Interventions:  No, not indicated    [facility] Care Management [phone]

## 2021-08-28 ENCOUNTER — Other Ambulatory Visit: Payer: Self-pay | Admitting: Orthopaedic Surgery

## 2021-09-19 ENCOUNTER — Encounter: Payer: Self-pay | Admitting: Internal Medicine

## 2021-09-19 ENCOUNTER — Ambulatory Visit (INDEPENDENT_AMBULATORY_CARE_PROVIDER_SITE_OTHER): Payer: BC Managed Care – PPO | Admitting: Internal Medicine

## 2021-09-19 VITALS — BP 142/72 | HR 74 | Ht 72.0 in | Wt 184.0 lb

## 2021-09-19 DIAGNOSIS — E1052 Type 1 diabetes mellitus with diabetic peripheral angiopathy with gangrene: Secondary | ICD-10-CM

## 2021-09-19 DIAGNOSIS — E785 Hyperlipidemia, unspecified: Secondary | ICD-10-CM | POA: Diagnosis not present

## 2021-09-19 LAB — POCT GLYCOSYLATED HEMOGLOBIN (HGB A1C): Hemoglobin A1C: 8 % — AB (ref 4.0–5.6)

## 2021-09-19 MED ORDER — GLUCAGON 3 MG/DOSE NA POWD: 3.0000 mg | Freq: Once | NASAL | 11 refills | Status: DC | PRN

## 2021-09-19 MED ORDER — INSULIN LISPRO (1 UNIT DIAL) 100 UNIT/ML (KWIKPEN)
PEN_INJECTOR | SUBCUTANEOUS | 3 refills | Status: DC
Start: 2021-09-19 — End: 2022-03-16

## 2021-09-19 NOTE — Patient Instructions (Signed)
Please use: - Lantus 6-8 units at midnight - Humalog 8-16 units before meals - 15 min before you start eating!  Please return in 3-4 months.  PATIENT INSTRUCTIONS FOR TYPE 2 DIABETES:  **Please join MyChart!** - see attached instructions about how to join if you have not done so already.  DIET AND EXERCISE Diet and exercise is an important part of diabetic treatment.  We recommended aerobic exercise in the form of brisk walking (working between 40-60% of maximal aerobic capacity, similar to brisk walking) for 150 minutes per week (such as 30 minutes five days per week) along with 3 times per week performing 'resistance' training (using various gauge rubber tubes with handles) 5-10 exercises involving the major muscle groups (upper body, lower body and core) performing 10-15 repetitions (or near fatigue) each exercise. Start at half the above goal but build slowly to reach the above goals. If limited by weight, joint pain, or disability, we recommend daily walking in a swimming pool with water up to waist to reduce pressure from joints while allow for adequate exercise.    BLOOD GLUCOSES Monitoring your blood glucoses is important for continued management of your diabetes. Please check your blood glucoses 2-4 times a day: fasting, before meals and at bedtime (you can rotate these measurements - e.g. one day check before the 3 meals, the next day check before 2 of the meals and before bedtime, etc.).   HYPOGLYCEMIA (low blood sugar) Hypoglycemia is usually a reaction to not eating, exercising, or taking too much insulin/ other diabetes drugs.  Symptoms include tremors, sweating, hunger, confusion, headache, etc. Treat IMMEDIATELY with 15 grams of Carbs: 4 glucose tablets  cup regular juice/soda 2 tablespoons raisins 4 teaspoons sugar 1 tablespoon honey Recheck blood glucose in 15 mins and repeat above if still symptomatic/blood glucose <100.  RECOMMENDATIONS TO REDUCE YOUR RISK OF DIABETIC  COMPLICATIONS: * Take your prescribed MEDICATION(S) * Follow a DIABETIC diet: Complex carbs, fiber rich foods, (monounsaturated and polyunsaturated) fats * AVOID saturated/trans fats, high fat foods, >2,300 mg salt per day. * EXERCISE at least 5 times a week for 30 minutes or preferably daily.  * DO NOT SMOKE OR DRINK more than 1 drink a day. * Check your FEET every day. Do not wear tightfitting shoes. Contact us if you develop an ulcer * See your EYE doctor once a year or more if needed * Get a FLU shot once a year * Get a PNEUMONIA vaccine once before and once after age 82 years  GOALS:  * Your Hemoglobin A1c of <7%  * fasting sugars need to be <130 * after meals sugars need to be <180 (2h after you start eating) * Your Systolic BP should be 140 or lower  * Your Diastolic BP should be 80 or lower  * Your HDL (Good Cholesterol) should be 40 or higher  * Your LDL (Bad Cholesterol) should be 100 or lower. * Your Triglycerides should be 150 or lower  * Your Urine microalbumin (kidney function) should be <30 * Your Body Mass Index should be 25 or lower    Please consider the following ways to cut down carbs and fat and increase fiber and micronutrients in your diet: - substitute whole grain for white bread or pasta - substitute brown rice for white rice - substitute 90-calorie flat bread pieces for slices of bread when possible - substitute sweet potatoes or yams for white potatoes - substitute humus for margarine - substitute tofu for cheese when  possible - substitute almond or rice milk for regular milk (would not drink soy milk daily due to concern for soy estrogen influence on breast cancer risk) - substitute dark chocolate for other sweets when possible - substitute water - can add lemon or orange slices for taste - for diet sodas (artificial sweeteners will trick your body that you can eat sweets without getting calories and will lead you to overeating and weight gain in the long  run) - do not skip breakfast or other meals (this will slow down the metabolism and will result in more weight gain over time)  - can try smoothies made from fruit and almond/rice milk in am instead of regular breakfast - can also try old-fashioned (not instant) oatmeal made with almond/rice milk in am - order the dressing on the side when eating salad at a restaurant (pour less than half of the dressing on the salad) - eat as little meat as possible - can try juicing, but should not forget that juicing will get rid of the fiber, so would alternate with eating raw veg./fruits or drinking smoothies - use as little oil as possible, even when using olive oil - can dress a salad with a mix of balsamic vinegar and lemon juice, for e.g. - use agave nectar, stevia sugar, or regular sugar rather than artificial sweateners - steam or broil/roast veggies  - snack on veggies/fruit/nuts (unsalted, preferably) when possible, rather than processed foods - reduce or eliminate aspartame in diet (it is in diet sodas, chewing gum, etc) Read the labels!  Try to read Dr. Katherina Right book: "Program for Reversing Diabetes" for other ideas for healthy eating.

## 2021-09-19 NOTE — Progress Notes (Signed)
Patient ID: Gordon Walker, male   DOB: [birth year], 56 y.o.   MRN: [id number]  HPI: Gordon Walker is a 56 y.o.-year-old male, returning for follow-up for DM1, dx in 1989, insulin-dependent since dx, uncontrolled, with complications (PAD, CKD, DR, bilateral leg amputations, PN). Pt. previously saw Dr. Everardo All, last visit 3.5 mo ago.  Reviewed HbA1c: Lab Results  Component Value Date   HGBA1C 7.4 (A) 05/25/2021   HGBA1C 9.1 (A) 03/16/2021   HGBA1C 7.4 (A) 10/11/2020   HGBA1C 7.2 (A) 06/06/2020   HGBA1C 6.6 (A) 12/08/2019   HGBA1C 7.2 (A) 09/07/2019   HGBA1C 5.7 (A) 06/05/2019   HGBA1C 6.1 (A) 04/06/2019   HGBA1C 6.2 (A) 02/02/2019   HGBA1C 6.6 (A) 11/20/2018   Pt is on a regimen of: - Lantus 4-6 units at midnight - Humalog (16)-(16)-(8-12) units 3x a day usually after he starts eating Review of Dr. George Hugh note, he refused an insulin pump in the past.    Pt checks his sugars >4x a day with his Dexcom CGM and they are:   Lowest sugar was 55; he has hypoglycemia awareness at 70.  Highest sugar was >350.  Glucometer: Relion  Pt's meals are: - Breakfast (5 pm): fast food (on the way to work) or sandwich -  - Lunch: take out or home cooked - Dinner: take out or home cooked - Snacks: at night  - + CKD, last BUN/creatinine:  Lab Results  Component Value Date   BUN 20 06/22/2021   BUN 28 (A) 09/17/2018   CREATININE 1.18 06/22/2021   CREATININE 1.2 09/17/2018  He is on lisinopril 10 mg daily.  - last set of lipids: Lab Results  Component Value Date   CHOL 172 06/22/2021   HDL 75.40 06/22/2021   LDLCALC 75 06/22/2021   TRIG 109.0 06/22/2021   CHOLHDL 2 06/22/2021  He is on Lipitor 20 mg daily  - last eye exam was in 2022: + DR  reportedly. He had laser Sx 2015, 2016.  - Last foot exam 10/11/2020. He had R BKA and L transmetatarsal amputation.   He also has a history of HTN.  He works 6 PM to 6 AM.  ROS: + see HPI No increased urination, blurry vision,  nausea, chest pain.  Past Medical History:  Diagnosis Date   Complication of anesthesia    anesthesia complication 01/18/16; pt unaware what happened    Dehiscence of amputation stump (HCC)    right   Diabetes mellitus without complication (HCC)    Type 1   Heart murmur    "years ago"   Hypertension    "years ago"   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, with long-term current use of insulin (HCC)    Past Surgical History:  Procedure Laterality Date   AMPUTATION Left 01/07/2015   Procedure: Left Foot 1st Ray Amputation;  Surgeon: Nadara Mustard, MD;  Location: [facility] OR;  Service: Orthopedics;  Laterality: Left;   AMPUTATION Left 07/15/2015   Procedure: Left Transmetatarsal Amputation;  Surgeon: Nadara Mustard, MD;  Location: [facility] OR;  Service: Orthopedics;  Laterality: Left;   AMPUTATION Right 12/30/2015   Procedure: 5th Ray Amputation Right Foot;  Surgeon: Nadara Mustard, MD;  Location: [facility] OR;  Service: Orthopedics;  Laterality: Right;   AMPUTATION Right 01/15/2016   Procedure: GUILLOTINE AMPUTATION  ABOVE THE ANKLE AMPUTATION;  Surgeon: Eldred Manges, MD;  Location: [facility] OR;  Service: Orthopedics;  Laterality: Right;   AMPUTATION Right 01/18/2016  Procedure: AMPUTATION BELOW KNEE;  Surgeon: Eldred Manges, MD;  Location: [facility] OR;  Service: Orthopedics;  Laterality: Right;   STUMP REVISION Right 03/21/2016   Procedure: Right Below Knee Amputation Stump Revision, WOUND VAC application;  Surgeon: Eldred Manges, MD;  Location: [facility] OR;  Service: Orthopedics;  Laterality: Right;   Social History   Socioeconomic History   Marital status: Married    Spouse name: Not on file   Number of children: Not on file   Years of education: Not on file   Highest education level: Not on file  Occupational History   Not on file  Tobacco Use   Smoking status: Never   Smokeless tobacco: Never  Substance and Sexual Activity   Alcohol use: No   Drug use: No   Sexual activity:  Not on file  Other Topics Concern   Not on file  Social History Narrative   Not on file   Social Determinants of Health   Financial Resource Strain: Not on file  Food Insecurity: Not on file  Transportation Needs: Not on file  Physical Activity: Not on file  Stress: Not on file  Social Connections: Not on file  Intimate Partner Violence: Not on file   Current Outpatient Medications on File Prior to Visit  Medication Sig Dispense Refill   amLODipine (NORVASC) 5 MG tablet Take 1 tablet (5 mg total) by mouth daily. 90 tablet 3   aspirin EC 81 MG tablet Take 81 mg by mouth daily.     atorvastatin (LIPITOR) 20 MG tablet Take 1 tablet (20 mg total) by mouth daily. 90 tablet 3   Continuous Blood Gluc Sensor (DEXCOM G6 SENSOR) MISC 1 Device by Other route See admin instructions. Change every 10 days. 9 each 3   Continuous Blood Gluc Transmit (DEXCOM G6 TRANSMITTER) MISC Use one transmitter once every 90 days to transmit blood sugars. 1 each 3   doxycycline (VIBRAMYCIN) 100 MG capsule Take 1 capsule by mouth twice daily 40 capsule 0   insulin glargine (LANTUS SOLOSTAR) 100 UNIT/ML Solostar Pen Inject 4 Units into the skin daily. 15 mL PRN   insulin lispro (HUMALOG KWIKPEN) 100 UNIT/ML KwikPen 16 units with breakfast, 16-18 units with lunch, and 18-20 units with supper, and pen needles 4/day 60 mL 3   Insulin Pen Needle 32G X 6 MM MISC 1 each by Does not apply route in the morning, at noon, and at bedtime. E10.52 270 each 3   lisinopril (ZESTRIL) 10 MG tablet Take 1 tablet by mouth once daily 90 tablet 1   Multiple Vitamin (MULTIVITAMIN WITH MINERALS) TABS tablet Take 1 tablet by mouth daily.     nitroGLYCERIN (NITRODUR - DOSED IN MG/24 HR) 0.2 mg/hr patch APPLY 1 PATCH TOPICALLY ONCE DAILY (Patient not taking: Reported on 06/22/2021) 30 patch 0   pantoprazole (PROTONIX) 40 MG tablet Take 1 tablet (40 mg total) by mouth daily. (Patient not taking: Reported on 06/22/2021) 30 tablet 3   SSD 1 % cream  APPLY  CREAM EXTERNALLY TO AFFECTED AREA ONCE DAILY 50 g 0   No current facility-administered medications on file prior to visit.   No Known Allergies Family History  Problem Relation Age of Onset   Hypertension Mother    Diabetes Mother    Cancer Mother    Prostate cancer Father    PE: BP (!) 142/72   Pulse 74   Ht 6' (1.829 m)   Wt 184 lb (83.5 kg)   SpO2 99%  BMI 24.95 kg/m  Wt Readings from Last 3 Encounters:  09/19/21 184 lb (83.5 kg)  06/22/21 190 lb 4 oz (86.3 kg)  05/25/21 187 lb 6.4 oz (85 kg)   Constitutional: normal weight, in NAD Eyes: no exophthalmos ENT: moist mucous membranes, no thyromegaly, no cervical lymphadenopathy Cardiovascular: RRR, No MRG Respiratory: CTA B Musculoskeletal: + B leg amputations: R AKA, L transmetatarsal amputation Skin: moist, warm, no rashes Neurological: no tremor with outstretched hands  ASSESSMENT: 1. DM1, uncontrolled, with complications - PAD - s/p leg amputations - R BKA 2017, L TMT amputations 2016 - PN - CKD - DR  2. HL  PLAN:  1. Patient with long-standing, uncontrolled diabetes, on basal bolus antidiabetic regimen, with still poor control.  Latest HbA1c was better, at 7.4%, at last visit with Dr. Everardo All.  This decreased from 9.1% previously.  At today's visit, HbA1c is 8% (higher). CGM interpretation: -At today's visit, we reviewed his CGM downloads: It appears that 48% of values are in target range (goal >70%), while 46% are higher than 180 (goal <25%), and 6% are lower than 70 (goal <4%).  The calculated average blood sugar is 186.  The projected HbA1c for the next 3 months (GMI) is 7.8%. -Reviewing the CGM trends, it appears that his sugars increase significantly after the first meal of his day, which is dinner when he works -he usually grabs this when driving to work and sugars are very high following the meal.  He also has occasional higher blood sugars between 1 and 5 AM and after 9 AM.  However, around these  times, occasionally sugars are lower, sometimes in the lower than 54. -Upon questioning, he is taking the insulin after he eats when the sugars are in the target range.  He apparently waits until sugars are in the 200s to actually take the insulin to avoid dropping too low.  The problem is that in this case, he has a high postprandial peak and, when insulin finally get into the system, he has large, abrupt decrease in blood sugars.  This is most likely the cause of his hypoglycemic episodes.  Because of this, his Lantus was greatly reduced in the past.  At this visit, we discussed about the importance of taking Humalog 15 minutes before the meals even if the sugars are in the target range.  He can take it at the start of the meal if the sugars are lower than 70s.  I feel that the timing of taking the insulin in his case is almost as important as the dose.  Until he takes it correctly, I do not think we can adjust his Humalog dose.  I did advise him to vary the dose depending on the size and consistency of his meals, between 8 and 16 units. -Regarding Lantus, I feel that he may be able to take less Humalog if he can use a higher dose.  I advised him to increase this slightly, between 6 to 8 units.  He prefers to take it at midnight both when he works and when he does not. -We also discussed about correcting both high blood sugars and low blood sugars.  I strongly advised him to not check with his glucometer, but his glucometer when he expects rapid changes in his blood sugars due to the lack time between the sugars in the interstitial fluid and the blood for 15 to 30 minutes. -We discussed about the insulins that are injected at the start of the meal and  I wrote [place] and [place]um[place]ev down for him to see if these are covered by his insurance. - I suggested to:  Patient Instructions  Please use: - Lantus 6-8 units at midnight - Humalog 8-16 units before meals - 15 min before you start eating!  Please return in  3-4 months.  - check sugars at different times of the day - check 4x a day, rotating checks - discussed about CBG targets for treatment: 80-130 mg/dL before meals and <938 mg/dL after meals; target BOF7P <7%. - given foot care handout  - given instructions for hypoglycemia management "15-15 rule" - advised for yearly eye exams  - Return to clinic in 3 mo   2. HL - Reviewed latest lipid panel from 06/2021: LDL above our target of less than 55 due to history of cardiovascular disease: Lab Results  Component Value Date   CHOL 172 06/22/2021   HDL 75.40 06/22/2021   LDLCALC 75 06/22/2021   TRIG 109.0 06/22/2021   CHOLHDL 2 06/22/2021  - Continues Lipitor 20 mg daily without side effects.  - Total time spent for the visit: 40 min, in precharting, reviewing Dr. George Hugh last note, obtaining medical information from the chart and from the pt, reviewing his  previous labs, evaluations, and treatments, reviewing his symptoms, counseling him about his diabetes (please see the discussed topics above), and developing a plan to further treat it and avoid further hypo and hyperglycemia.   Carlus Pavlov, MD PhD [facility] Endocrinology

## 2021-09-25 ENCOUNTER — Other Ambulatory Visit: Payer: Self-pay | Admitting: Orthopaedic Surgery

## 2021-09-28 ENCOUNTER — Encounter: Payer: Self-pay | Admitting: Emergency Medicine

## 2021-09-28 ENCOUNTER — Ambulatory Visit: Payer: BC Managed Care – PPO | Admitting: Emergency Medicine

## 2021-09-28 VITALS — BP 128/64 | HR 88 | Temp 98.2°F | Ht 72.0 in | Wt 184.0 lb

## 2021-09-28 DIAGNOSIS — I152 Hypertension secondary to endocrine disorders: Secondary | ICD-10-CM | POA: Diagnosis not present

## 2021-09-28 DIAGNOSIS — Z1211 Encounter for screening for malignant neoplasm of colon: Secondary | ICD-10-CM

## 2021-09-28 DIAGNOSIS — Z23 Encounter for immunization: Secondary | ICD-10-CM

## 2021-09-28 DIAGNOSIS — E1159 Type 2 diabetes mellitus with other circulatory complications: Secondary | ICD-10-CM | POA: Diagnosis not present

## 2021-09-28 DIAGNOSIS — E1169 Type 2 diabetes mellitus with other specified complication: Secondary | ICD-10-CM

## 2021-09-28 DIAGNOSIS — E785 Hyperlipidemia, unspecified: Secondary | ICD-10-CM

## 2021-09-28 NOTE — Assessment & Plan Note (Addendum)
Well-controlled hypertension. Current pain amlodipine 5 mg and lisinopril 10 mg daily. Cardiovascular risks associated with hypertension discussed. Diet and nutrition discussed. Follow-up in 6 months. Diabetes management by endocrinologist. Recent office visit notes reviewed. Lab Results  Component Value Date   HGBA1C 8.0 (A) 09/19/2021

## 2021-09-28 NOTE — Patient Instructions (Signed)
Hypertension, Adult High blood pressure (hypertension) is when the force of blood pumping through the arteries is too strong. The arteries are the blood vessels that carry blood from the heart throughout the body. Hypertension forces the heart to work harder to pump blood and may cause arteries to become narrow or stiff. Untreated or uncontrolled hypertension can lead to a heart attack, heart failure, a stroke, kidney disease, and other problems. A blood pressure reading consists of a higher number over a lower number. Ideally, your blood pressure should be below 120/80. The first ("top") number is called the systolic pressure. It is a measure of the pressure in your arteries as your heart beats. The second ("bottom") number is called the diastolic pressure. It is a measure of the pressure in your arteries as the heart relaxes. What are the causes? The exact cause of this condition is not known. There are some conditions that result in high blood pressure. What increases the risk? Certain factors may make you more likely to develop high blood pressure. Some of these risk factors are under your control, including: Smoking. Not getting enough exercise or physical activity. Being overweight. Having too much fat, sugar, calories, or salt (sodium) in your diet. Drinking too much alcohol. Other risk factors include: Having a personal history of heart disease, diabetes, high cholesterol, or kidney disease. Stress. Having a family history of high blood pressure and high cholesterol. Having obstructive sleep apnea. Age. The risk increases with age. What are the signs or symptoms? High blood pressure may not cause symptoms. Very high blood pressure (hypertensive crisis) may cause: Headache. Fast or irregular heartbeats (palpitations). Shortness of breath. Nosebleed. Nausea and vomiting. Vision changes. Severe chest pain, dizziness, and seizures. How is this diagnosed? This condition is diagnosed by  measuring your blood pressure while you are seated, with your arm resting on a flat surface, your legs uncrossed, and your feet flat on the floor. The cuff of the blood pressure monitor will be placed directly against the skin of your upper arm at the level of your heart. Blood pressure should be measured at least twice using the same arm. Certain conditions can cause a difference in blood pressure between your right and left arms. If you have a high blood pressure reading during one visit or you have normal blood pressure with other risk factors, you may be asked to: Return on a different day to have your blood pressure checked again. Monitor your blood pressure at home for 1 week or longer. If you are diagnosed with hypertension, you may have other blood or imaging tests to help your health care provider understand your overall risk for other conditions. How is this treated? This condition is treated by making healthy lifestyle changes, such as eating healthy foods, exercising more, and reducing your alcohol intake. You may be referred for counseling on a healthy diet and physical activity. Your health care provider may prescribe medicine if lifestyle changes are not enough to get your blood pressure under control and if: Your systolic blood pressure is above 130. Your diastolic blood pressure is above 80. Your personal target blood pressure may vary depending on your medical conditions, your age, and other factors. Follow these instructions at home: Eating and drinking  Eat a diet that is high in fiber and potassium, and low in sodium, added sugar, and fat. An example of this eating plan is called the DASH diet. DASH stands for Dietary Approaches to Stop Hypertension. To eat this way: Eat   plenty of fresh fruits and vegetables. Try to fill one half of your plate at each meal with fruits and vegetables. Eat whole grains, such as whole-wheat pasta, brown rice, or whole-grain bread. Fill about one  fourth of your plate with whole grains. Eat or drink low-fat dairy products, such as skim milk or low-fat yogurt. Avoid fatty cuts of meat, processed or cured meats, and poultry with skin. Fill about one fourth of your plate with lean proteins, such as fish, chicken without skin, beans, eggs, or tofu. Avoid pre-made and processed foods. These tend to be higher in sodium, added sugar, and fat. Reduce your daily sodium intake. Many people with hypertension should eat less than 1,500 mg of sodium a day. Do not drink alcohol if: Your health care provider tells you not to drink. You are pregnant, may be pregnant, or are planning to become pregnant. If you drink alcohol: Limit how much you have to: 0-1 drink a day for women. 0-2 drinks a day for men. Know how much alcohol is in your drink. In the [place] one drink equals one 12 oz bottle of beer (355 mL), one 5 oz glass of wine (148 mL), or one 1 oz glass of hard liquor (44 mL). Lifestyle  Work with your health care provider to maintain a healthy body weight or to lose weight. Ask what an ideal weight is for you. Get at least 30 minutes of exercise that causes your heart to beat faster (aerobic exercise) most days of the week. Activities may include walking, swimming, or biking. Include exercise to strengthen your muscles (resistance exercise), such as Pilates or lifting weights, as part of your weekly exercise routine. Try to do these types of exercises for 30 minutes at least 3 days a week. Do not use any products that contain nicotine or tobacco. These products include cigarettes, chewing tobacco, and vaping devices, such as e-cigarettes. If you need help quitting, ask your health care provider. Monitor your blood pressure at home as told by your health care provider. Keep all follow-up visits. This is important. Medicines Take over-the-counter and prescription medicines only as told by your health care provider. Follow directions carefully. Blood  pressure medicines must be taken as prescribed. Do not skip doses of blood pressure medicine. Doing this puts you at risk for problems and can make the medicine less effective. Ask your health care provider about side effects or reactions to medicines that you should watch for. Contact a health care provider if you: Think you are having a reaction to a medicine you are taking. Have headaches that keep coming back (recurring). Feel dizzy. Have swelling in your ankles. Have trouble with your vision. Get help right away if you: Develop a severe headache or confusion. Have unusual weakness or numbness. Feel faint. Have severe pain in your chest or abdomen. Vomit repeatedly. Have trouble breathing. These symptoms may be an emergency. Get help right away. Call 911. Do not wait to see if the symptoms will go away. Do not drive yourself to the hospital. Summary Hypertension is when the force of blood pumping through your arteries is too strong. If this condition is not controlled, it may put you at risk for serious complications. Your personal target blood pressure may vary depending on your medical conditions, your age, and other factors. For most people, a normal blood pressure is less than 120/80. Hypertension is treated with lifestyle changes, medicines, or a combination of both. Lifestyle changes include losing weight, eating a healthy,   low-sodium diet, exercising more, and limiting alcohol. This information is not intended to replace advice given to you by your health care provider. Make sure you discuss any questions you have with your health care provider. Document Revised: 11/15/2020 Document Reviewed: 11/15/2020 [employer] Patient Education  2023 [employer]  

## 2021-09-28 NOTE — Progress Notes (Signed)
Gordon Walker 56 y.o.   Chief Complaint  Patient presents with   Follow-up    f/u appt , no concerns     HISTORY OF PRESENT ILLNESS: This is a 56 y.o. male with history of diabetes, hypertension and dyslipidemia here for 43-month follow-up. Blood pressure doing very well.  On lisinopril 10 and amlodipine 5 mg daily. Sees endocrinologist on a regular basis for his diabetes management. Last office visit assessment and plan as follows: ASSESSMENT: 1. DM1, uncontrolled, with complications - PAD - s/p leg amputations - R BKA 2017, L TMT amputations 2016 - PN - CKD - DR   2. HL   PLAN:  1. Patient with long-standing, uncontrolled diabetes, on basal bolus antidiabetic regimen, with still poor control.  Latest HbA1c was better, at 7.4%, at last visit with Dr. Everardo All.  This decreased from 9.1% previously.  At today's visit, HbA1c is 8% (higher). CGM interpretation: -At today's visit, we reviewed his CGM downloads: It appears that 48% of values are in target range (goal >70%), while 46% are higher than 180 (goal <25%), and 6% are lower than 70 (goal <4%).  The calculated average blood sugar is 186.  The projected HbA1c for the next 3 months (GMI) is 7.8%. -Reviewing the CGM trends, it appears that his sugars increase significantly after the first meal of his day, which is dinner when he works -he usually grabs this when driving to work and sugars are very high following the meal.  He also has occasional higher blood sugars between 1 and 5 AM and after 9 AM.  However, around these times, occasionally sugars are lower, sometimes in the lower than 54. -Upon questioning, he is taking the insulin after he eats when the sugars are in the target range.  He apparently waits until sugars are in the 200s to actually take the insulin to avoid dropping too low.  The problem is that in this case, he has a high postprandial peak and, when insulin finally get into the system, he has large, abrupt  decrease in blood sugars.  This is most likely the cause of his hypoglycemic episodes.  Because of this, his Lantus was greatly reduced in the past.  At this visit, we discussed about the importance of taking Humalog 15 minutes before the meals even if the sugars are in the target range.  He can take it at the start of the meal if the sugars are lower than 70s.  I feel that the timing of taking the insulin in his case is almost as important as the dose.  Until he takes it correctly, I do not think we can adjust his Humalog dose.  I did advise him to vary the dose depending on the size and consistency of his meals, between 8 and 16 units. -Regarding Lantus, I feel that he may be able to take less Humalog if he can use a higher dose.  I advised him to increase this slightly, between 6 to 8 units.  He prefers to take it at midnight both when he works and when he does not. -We also discussed about correcting both high blood sugars and low blood sugars.  I strongly advised him to not check with his glucometer, but his glucometer when he expects rapid changes in his blood sugars due to the lack time between the sugars in the interstitial fluid and the blood for 15 to 30 minutes. -We discussed about the insulins that are injected at the start of  the meal and I wrote [place] and Lyumjev down for him to see if these are covered by his insurance. - I suggested to:  Patient Instructions  Please use: - Lantus 6-8 units at midnight - Humalog 8-16 units before meals - 15 min before you start eating!   Please return in 3-4 months.   - check sugars at different times of the day - check 4x a day, rotating checks - discussed about CBG targets for treatment: 80-130 mg/dL before meals and <334 mg/dL after meals; target DHW8S <7%. - given foot care handout  - given instructions for hypoglycemia management "15-15 rule" - advised for yearly eye exams  - Return to clinic in 3 mo    2. HL - Reviewed latest lipid panel from  06/2021: LDL above our target of less than 55 due to history of cardiovascular disease: Recent Labs       Lab Results  Component Value Date    CHOL 172 06/22/2021    HDL 75.40 06/22/2021    LDLCALC 75 06/22/2021    TRIG 109.0 06/22/2021    CHOLHDL 2 06/22/2021    - Continues Lipitor 20 mg daily without side effects.   - Total time spent for the visit: 40 min, in precharting, reviewing Dr. George Hugh last note, obtaining medical information from the chart and from the pt, reviewing his  previous labs, evaluations, and treatments, reviewing his symptoms, counseling him about his diabetes (please see the discussed topics above), and developing a plan to further treat it and avoid further hypo and hyperglycemia.              Carlus Pavlov, MD PhD [facility]   BP Readings from Last 3 Encounters:  09/19/21 (!) 142/72  06/22/21 140/82  05/25/21 (!) 144/70     HPI   Prior to Admission medications   Medication Sig Start Date End Date Taking? Authorizing Provider  amLODipine (NORVASC) 5 MG tablet Take 1 tablet (5 mg total) by mouth daily. 06/22/21 06/17/22 Yes SagardiaEilleen Kempf, MD  aspirin EC 81 MG tablet Take 81 mg by mouth daily.   Yes [provider]  atorvastatin (LIPITOR) 20 MG tablet Take 1 tablet (20 mg total) by mouth daily. 06/22/21 06/17/22 Yes Terrie Haring, Eilleen Kempf, MD  Continuous Blood Gluc Sensor (DEXCOM G6 SENSOR) MISC 1 Device by Other route See admin instructions. Change every 10 days. 03/16/21  Yes Romero Belling, MD  Continuous Blood Gluc Transmit (DEXCOM G6 TRANSMITTER) MISC Use one transmitter once every 90 days to transmit blood sugars. 03/16/21  Yes Romero Belling, MD  doxycycline (VIBRAMYCIN) 100 MG capsule Take 1 capsule by mouth twice daily 12/28/20  Yes Eldred Manges, MD  Glucagon 3 MG/DOSE POWD Place 3 mg into the nose once as needed for up to 1 dose. 09/19/21  Yes Carlus Pavlov, MD  insulin glargine (LANTUS SOLOSTAR) 100 UNIT/ML Solostar Pen  Inject 4 Units into the skin daily. 05/25/21  Yes Romero Belling, MD  insulin lispro (HUMALOG KWIKPEN) 100 UNIT/ML KwikPen Inject under skin 8-16 units before meals 3x a day 09/19/21  Yes Carlus Pavlov, MD  Insulin Pen Needle 32G X 6 MM MISC 1 each by Does not apply route in the morning, at noon, and at bedtime. E10.52 06/05/19  Yes Romero Belling, MD  lisinopril (ZESTRIL) 10 MG tablet Take 1 tablet by mouth once daily 07/12/21  Yes Bryanda Mikel, Eilleen Kempf, MD  Multiple Vitamin (MULTIVITAMIN WITH MINERALS) TABS tablet Take 1 tablet by mouth daily.  Yes [provider]  SSD 1 % cream APPLY  CREAM EXTERNALLY TO AFFECTED AREA ONCE DAILY 09/26/21  Yes Eldred Manges, MD  nitroGLYCERIN (NITRODUR - DOSED IN MG/24 HR) 0.2 mg/hr patch APPLY 1 PATCH TOPICALLY ONCE DAILY Patient not taking: Reported on 09/28/2021 02/11/20   Persons, [place], PA  pantoprazole (PROTONIX) 40 MG tablet Take 1 tablet (40 mg total) by mouth daily. 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, with long-term current use of insulin (HCC)    Anemia 01/06/2015    Past Medical History:  Diagnosis Date   Complication of anesthesia    anesthesia complication 01/18/16; pt unaware what happened    Dehiscence of amputation stump (HCC)    right   Diabetes mellitus without complication (HCC)    Type 1   Heart murmur    "years ago"   Hypertension    "years ago"   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, with long-term current use of insulin (HCC)     Past Surgical  History:  Procedure Laterality Date   AMPUTATION Left 01/07/2015   Procedure: Left Foot 1st Ray Amputation;  Surgeon: Nadara Mustard, MD;  Location: [facility] OR;  Service: Orthopedics;  Laterality: Left;   AMPUTATION Left 07/15/2015   Procedure: Left Transmetatarsal Amputation;  Surgeon: Nadara Mustard, MD;  Location: [facility] OR;  Service: Orthopedics;  Laterality: Left;   AMPUTATION Right 12/30/2015   Procedure: 5th Ray Amputation Right Foot;  Surgeon: Nadara Mustard, MD;  Location: [facility] OR;  Service: Orthopedics;  Laterality: Right;   AMPUTATION Right 01/15/2016   Procedure: GUILLOTINE AMPUTATION  ABOVE THE ANKLE AMPUTATION;  Surgeon: Eldred Manges, MD;  Location: [facility] OR;  Service: Orthopedics;  Laterality: Right;   AMPUTATION Right 01/18/2016   Procedure: AMPUTATION BELOW KNEE;  Surgeon: Eldred Manges, MD;  Location: [facility] OR;  Service: Orthopedics;  Laterality: Right;   STUMP REVISION Right 03/21/2016   Procedure: Right Below Knee Amputation Stump Revision, WOUND VAC application;  Surgeon: Eldred Manges, MD;  Location: [facility] OR;  Service: Orthopedics;  Laterality: Right;    Social History   Socioeconomic History   Marital status: Married    Spouse name: Not on file   Number of children: Not on file   Years of education: Not on file   Highest education level: Not on file  Occupational History   Not on file  Tobacco Use   Smoking status: Never   Smokeless tobacco: Never  Substance and Sexual Activity   Alcohol use: No   Drug use: No   Sexual activity: Not on file  Other Topics Concern   Not on file  Social History Narrative   Not on file   Social Determinants of Health   Financial Resource Strain: Not on file  Food Insecurity: Not on file  Transportation Needs: Not on file  Physical Activity: Not on file  Stress: Not on file  Social Connections: Not on file  Intimate Partner Violence: Not on file    Family History  Problem Relation Age of Onset   Hypertension Mother    Diabetes Mother     Cancer Mother    Prostate cancer Father      Review of Systems  Constitutional: Negative.  Negative for chills and fever.  HENT: Negative.  Negative for congestion and sore throat.   Respiratory: Negative.  Negative for cough and shortness of breath.   Cardiovascular: Negative.  Negative for chest pain and palpitations.  Gastrointestinal:  Negative for abdominal pain, diarrhea, nausea and vomiting.  Genitourinary: Negative.  Negative for dysuria and hematuria.  Musculoskeletal: Negative.   Skin: Negative.  Negative for rash.  Neurological:  Negative for dizziness and headaches.  All other systems reviewed and are negative.  Today's Vitals   09/28/21 0848  BP: 128/64  Pulse: 88  Temp: 98.2 F (36.8 C)  TempSrc: Oral  SpO2: 98%  Weight: 184 lb (83.5 kg)  Height: 6' (1.829 m)   Body mass index is 24.95 kg/m. Wt Readings from Last 3 Encounters:  09/28/21 184 lb (83.5 kg)  09/19/21 184 lb (83.5 kg)  06/22/21 190 lb 4 oz (86.3 kg)     Physical Exam Vitals reviewed.  Constitutional:      Appearance: Normal appearance.  HENT:     Head: Normocephalic.     Mouth/Throat:     Mouth: Mucous membranes are moist.     Pharynx: Oropharynx is clear.  Eyes:     Extraocular Movements: Extraocular movements intact.     Pupils: Pupils are equal, round, and reactive to light.  Cardiovascular:     Rate and Rhythm: Normal rate and regular rhythm.     Pulses: Normal pulses.     Heart sounds: Normal heart sounds.  Pulmonary:     Effort: Pulmonary effort is normal.     Breath sounds: Normal breath sounds.  Musculoskeletal:     Cervical back: No tenderness.  Lymphadenopathy:     Cervical: No cervical adenopathy.  Skin:    General: Skin is warm and dry.     Capillary Refill: Capillary refill takes less than 2 seconds.  Neurological:     General: No focal deficit present.     Mental Status: He is alert and oriented to person, place, and time.  Psychiatric:        Mood and Affect:  Mood normal.        Behavior: Behavior normal.      ASSESSMENT & PLAN: A total of 42 minutes was spent with the patient and counseling/coordination of care regarding preparing for this visit, review of most recent office visit notes, review of multiple chronic medical problems and their management, review of all medications, review of most recent blood work results, cardiovascular risks associated with hypertension and diabetes, education on nutrition, prognosis, documentation and need for follow-up.  Problem List Items Addressed This Visit       Cardiovascular and Mediastinum   Hypertension associated with diabetes (HCC) - Primary    Well-controlled hypertension. Current pain amlodipine 5 mg and lisinopril 10 mg daily. Cardiovascular risks associated with hypertension discussed. Diet and nutrition discussed. Follow-up in 6 months. Diabetes management by endocrinologist. Recent office visit notes reviewed. Lab Results  Component Value Date   HGBA1C 8.0 (A) 09/19/2021           Endocrine   Dyslipidemia associated with type 2 diabetes mellitus (HCC)    Stable.  Diet and nutrition discussed. Normal lipid profile done last June. Continue atorvastatin 20 mg daily. The 10-year ASCVD  risk score (Arnett DK, et al., 2019) is: 16.3%   Values used to calculate the score:     Age: 50 years     Sex: Male     Is Non-Hispanic African American: Yes     Diabetic: Yes     Tobacco smoker: No     Systolic Blood Pressure: 128 mmHg     Is BP treated: Yes     HDL Cholesterol: 75.4 mg/dL     Total Cholesterol: 172 mg/dL       Other Visit Diagnoses     Need for vaccination       Relevant Orders   Zoster Recombinant (Shingrix ) (Completed)   Flu Vaccine QUAD 6+ mos PF IM (Fluarix Quad PF) (Completed)   Colon cancer screening       Relevant Orders   Cologuard        Patient Instructions  Hypertension, Adult High blood pressure (hypertension) is when the force of blood pumping  through the arteries is too strong. The arteries are the blood vessels that carry blood from the heart throughout the body. Hypertension forces the heart to work harder to pump blood and may cause arteries to become narrow or stiff. Untreated or uncontrolled hypertension can lead to a heart attack, heart failure, a stroke, kidney disease, and other problems. A blood pressure reading consists of a higher number over a lower number. Ideally, your blood pressure should be below 120/80. The first ("top") number is called the systolic pressure. It is a measure of the pressure in your arteries as your heart beats. The second ("bottom") number is called the diastolic pressure. It is a measure of the pressure in your arteries as the heart relaxes. What are the causes? The exact cause of this condition is not known. There are some conditions that result in high blood pressure. What increases the risk? Certain factors may make you more likely to develop high blood pressure. Some of these risk factors are under your control, including: Smoking. Not getting enough exercise or physical activity. Being overweight. Having too much fat, sugar, calories, or salt (sodium) in your diet. Drinking too much alcohol. Other risk factors include: Having a personal history of heart disease, diabetes, high cholesterol, or kidney disease. Stress. Having a family history of high blood pressure and high cholesterol. Having obstructive sleep apnea. Age. The risk increases with age. What are the signs or symptoms? High blood pressure may not cause symptoms. Very high blood pressure (hypertensive crisis) may cause: Headache. Fast or irregular heartbeats (palpitations). Shortness of breath. Nosebleed. Nausea and vomiting. Vision changes. Severe chest pain, dizziness, and seizures. How is this diagnosed? This condition is diagnosed by measuring your blood pressure while you are seated, with your arm resting on a flat  surface, your legs uncrossed, and your feet flat on the floor. The cuff of the blood pressure monitor will be placed directly against the skin of your upper arm at the level of your heart. Blood pressure should be measured at least twice using the same arm. Certain conditions can cause a difference in blood pressure between your right and left arms. If you have a high blood pressure reading during one visit or you have normal blood pressure with other risk factors, you may be asked to: Return on a different day to have your blood pressure checked again. Monitor your blood pressure at home for 1 week or longer. If you are diagnosed with hypertension, you may have other blood or imaging  tests to help your health care provider understand your overall risk for other conditions. How is this treated? This condition is treated by making healthy lifestyle changes, such as eating healthy foods, exercising more, and reducing your alcohol intake. You may be referred for counseling on a healthy diet and physical activity. Your health care provider may prescribe medicine if lifestyle changes are not enough to get your blood pressure under control and if: Your systolic blood pressure is above 130. Your diastolic blood pressure is above 80. Your personal target blood pressure may vary depending on your medical conditions, your age, and other factors. Follow these instructions at home: Eating and drinking  Eat a diet that is high in fiber and potassium, and low in sodium, added sugar, and fat. An example of this eating plan is called the DASH diet. DASH stands for Dietary Approaches to Stop Hypertension. To eat this way: Eat plenty of fresh fruits and vegetables. Try to fill one half of your plate at each meal with fruits and vegetables. Eat whole grains, such as whole-wheat pasta, brown rice, or whole-grain bread. Fill about one fourth of your plate with whole grains. Eat or drink low-fat dairy products, such as  skim milk or low-fat yogurt. Avoid fatty cuts of meat, processed or cured meats, and poultry with skin. Fill about one fourth of your plate with lean proteins, such as fish, chicken without skin, beans, eggs, or tofu. Avoid pre-made and processed foods. These tend to be higher in sodium, added sugar, and fat. Reduce your daily sodium intake. Many people with hypertension should eat less than 1,500 mg of sodium a day. Do not drink alcohol if: Your health care provider tells you not to drink. You are pregnant, may be pregnant, or are planning to become pregnant. If you drink alcohol: Limit how much you have to: 0-1 drink a day for women. 0-2 drinks a day for men. Know how much alcohol is in your drink. In the [place] one drink equals one 12 oz bottle of beer (355 mL), one 5 oz glass of wine (148 mL), or one 1 oz glass of hard liquor (44 mL). Lifestyle  Work with your health care provider to maintain a healthy body weight or to lose weight. Ask what an ideal weight is for you. Get at least 30 minutes of exercise that causes your heart to beat faster (aerobic exercise) most days of the week. Activities may include walking, swimming, or biking. Include exercise to strengthen your muscles (resistance exercise), such as Pilates or lifting weights, as part of your weekly exercise routine. Try to do these types of exercises for 30 minutes at least 3 days a week. Do not use any products that contain nicotine or tobacco. These products include cigarettes, chewing tobacco, and vaping devices, such as e-cigarettes. If you need help quitting, ask your health care provider. Monitor your blood pressure at home as told by your health care provider. Keep all follow-up visits. This is important. Medicines Take over-the-counter and prescription medicines only as told by your health care provider. Follow directions carefully. Blood pressure medicines must be taken as prescribed. Do not skip doses of blood pressure  medicine. Doing this puts you at risk for problems and can make the medicine less effective. Ask your health care provider about side effects or reactions to medicines that you should watch for. Contact a health care provider if you: Think you are having a reaction to a medicine you are taking. Have headaches that  keep coming back (recurring). Feel dizzy. Have swelling in your ankles. Have trouble with your vision. Get help right away if you: Develop a severe headache or confusion. Have unusual weakness or numbness. Feel faint. Have severe pain in your chest or abdomen. Vomit repeatedly. Have trouble breathing. These symptoms may be an emergency. Get help right away. Call 911. Do not wait to see if the symptoms will go away. Do not drive yourself to the hospital. Summary Hypertension is when the force of blood pumping through your arteries is too strong. If this condition is not controlled, it may put you at risk for serious complications. Your personal target blood pressure may vary depending on your medical conditions, your age, and other factors. For most people, a normal blood pressure is less than 120/80. Hypertension is treated with lifestyle changes, medicines, or a combination of both. Lifestyle changes include losing weight, eating a healthy, low-sodium diet, exercising more, and limiting alcohol. This information is not intended to replace advice given to you by your health care provider. Make sure you discuss any questions you have with your health care provider. Document Revised: 11/15/2020 Document Reviewed: 11/15/2020 Elsevier Patient Education  2023 Elsevier Inc.    Edwina BarthMiguel Marcele Kosta, MD [facility]

## 2021-09-28 NOTE — Assessment & Plan Note (Signed)
Stable.  Diet and nutrition discussed. Normal lipid profile done last June. Continue atorvastatin 20 mg daily. The 10-year ASCVD risk score (Arnett DK, et al., 2019) is: 16.3%   Values used to calculate the score:     Age: 56 years     Sex: Male     Is Non-Hispanic African American: Yes     Diabetic: Yes     Tobacco smoker: No     Systolic Blood Pressure: 128 mmHg     Is BP treated: Yes     HDL Cholesterol: 75.4 mg/dL     Total Cholesterol: 172 mg/dL

## 2021-10-18 ENCOUNTER — Other Ambulatory Visit: Payer: Self-pay | Admitting: Emergency Medicine

## 2021-10-18 DIAGNOSIS — Z1211 Encounter for screening for malignant neoplasm of colon: Secondary | ICD-10-CM

## 2021-10-18 LAB — COLOGUARD: COLOGUARD: POSITIVE — AB

## 2021-12-12 LAB — HM DIABETES EYE EXAM

## 2022-01-10 ENCOUNTER — Other Ambulatory Visit: Payer: Self-pay | Admitting: Emergency Medicine

## 2022-01-10 DIAGNOSIS — I10 Essential (primary) hypertension: Secondary | ICD-10-CM

## 2022-01-18 ENCOUNTER — Ambulatory Visit: Payer: BC Managed Care – PPO | Admitting: Internal Medicine

## 2022-01-22 ENCOUNTER — Other Ambulatory Visit: Payer: Self-pay | Admitting: Orthopaedic Surgery

## 2022-03-09 ENCOUNTER — Encounter: Payer: Self-pay | Admitting: Internal Medicine

## 2022-03-09 ENCOUNTER — Ambulatory Visit: Payer: BC Managed Care – PPO | Admitting: Internal Medicine

## 2022-03-09 VITALS — BP 140/78 | HR 80 | Ht 72.0 in | Wt 172.6 lb

## 2022-03-09 DIAGNOSIS — E010 Iodine-deficiency related diffuse (endemic) goiter: Secondary | ICD-10-CM

## 2022-03-09 DIAGNOSIS — E785 Hyperlipidemia, unspecified: Secondary | ICD-10-CM | POA: Diagnosis not present

## 2022-03-09 DIAGNOSIS — E1052 Type 1 diabetes mellitus with diabetic peripheral angiopathy with gangrene: Secondary | ICD-10-CM

## 2022-03-09 MED ORDER — DEXCOM G7 SENSOR MISC: 3.0000 | 4 refills | Status: DC

## 2022-03-09 NOTE — Progress Notes (Signed)
Patient ID: Gordon Walker, male   DOB: 1965-03-26, 57 y.o.   MRN: [id number]  HPI: Gordon Walker is a 57 y.o.-year-old male, returning for follow-up for DM1, dx in 1989, insulin-dependent since dx, uncontrolled, with complications (PAD, CKD, DR, bilateral leg amputations, PN). Pt. previously saw Dr. Loanne Drilling, but last visit with me 6 months ago.  Interim history: No increased urination, blurry vision, nausea, chest pain. He had a viral infection last month >> decreased appetite >> lost 12 lbs.  Reviewed HbA1c: Lab Results  Component Value Date   HGBA1C 8.0 (A) 09/19/2021   HGBA1C 7.4 (A) 05/25/2021   HGBA1C 9.1 (A) 03/16/2021   HGBA1C 7.4 (A) 10/11/2020   HGBA1C 7.2 (A) 06/06/2020   HGBA1C 6.6 (A) 12/08/2019   HGBA1C 7.2 (A) 09/07/2019   HGBA1C 5.7 (A) 06/05/2019   HGBA1C 6.1 (A) 04/06/2019   HGBA1C 6.2 (A) 02/02/2019   At last visit he was on: - Lantus 4-6 units at midnight - Humalog (16)-(16)-(8-12) units 3x a day usually after he starts eating Review of Dr. Cordelia Pen note, he refused an insulin pump in the past.  I advised him to change to: - Lantus 6-8 >> 8 units at midnight (work) - Humalog 8-16 >> 8 units before meals, 4-5 units before a snack  Pt checks his sugars >4x a day with his Dexcom CGM and they are:  Previously:   Lowest sugar was 55 >> 50s; he has hypoglycemia awareness at 70.  Highest sugar was >350 >> 400s.  Glucometer: Relion  Pt's meals are: - Breakfast (5 pm): fast food (on the way to work) or sandwich -  - Lunch: take out or home cooked - Dinner: take out or home cooked - Snacks: at night  - + CKD, last BUN/creatinine:  Lab Results  Component Value Date   BUN 20 06/22/2021   BUN 28 (A) 09/17/2018   CREATININE 1.18 06/22/2021   CREATININE 1.2 09/17/2018  He is on lisinopril 10 mg daily.  - + HL; last set of lipids: Lab Results  Component Value Date   CHOL 172 06/22/2021   HDL 75.40 06/22/2021   LDLCALC 75 06/22/2021   TRIG 109.0  06/22/2021   CHOLHDL 2 06/22/2021  He is on Lipitor 20 mg daily  - last eye exam was in 11/2021: + DR (severe), + glaucoma. He had laser Sx 2015, 2016.  - Last foot exam 10/11/2020. He had R BKA and L transmetatarsal amputation.   He also has a history of HTN.  No results found for: "TSH"  He works 6 PM to 6 AM 2-3 times a week.  ROS: + see HPI  Past Medical History:  Diagnosis Date   Complication of anesthesia    anesthesia complication [id number]; pt unaware what happened    Dehiscence of amputation stump ([place])    right   Diabetes mellitus without complication (HCC)    Type 1   Heart murmur    "years ago"   Hypertension    "years ago"   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, with long-term current use of insulin ([place])    Past Surgical History:  Procedure Laterality Date   AMPUTATION Left 01/07/2015   Procedure: Left Foot 1st Ray Amputation;  Surgeon: Newt Minion, MD;  Location: [place];  Service: Orthopedics;  Laterality: Left;   AMPUTATION Left 07/15/2015   Procedure: Left Transmetatarsal Amputation;  Surgeon: Newt Minion, MD;  Location: [place];  Service: Orthopedics;  Laterality:  Left;   AMPUTATION Right 12/30/2015   Procedure: 5th Ray Amputation Right Foot;  Surgeon: Newt Minion, MD;  Location: [place];  Service: Orthopedics;  Laterality: Right;   AMPUTATION Right 01/15/2016   Procedure: GUILLOTINE AMPUTATION  ABOVE THE ANKLE AMPUTATION;  Surgeon: Marybelle Killings, MD;  Location: [place];  Service: Orthopedics;  Laterality: Right;   AMPUTATION Right 01/18/2016   Procedure: AMPUTATION BELOW KNEE;  Surgeon: Marybelle Killings, MD;  Location: [place];  Service: Orthopedics;  Laterality: Right;   STUMP REVISION Right 03/21/2016   Procedure: Right Below Knee Amputation Stump Revision, WOUND VAC application;  Surgeon: Marybelle Killings, MD;  Location: [place];  Service: Orthopedics;  Laterality: Right;   Social History   Socioeconomic History   Marital  status: Married    Spouse name: Not on file   Number of children: Not on file   Years of education: Not on file   Highest education level: Not on file  Occupational History   Not on file  Tobacco Use   Smoking status: Never   Smokeless tobacco: Never  Substance and Sexual Activity   Alcohol use: No   Drug use: No   Sexual activity: Not on file  Other Topics Concern   Not on file  Social History Narrative   Not on file   Social Determinants of Health   Financial Resource Strain: Not on file  Food Insecurity: Not on file  Transportation Needs: Not on file  Physical Activity: Not on file  Stress: Not on file  Social Connections: Not on file  Intimate Partner Violence: Not on file   Current Outpatient Medications on File Prior to Visit  Medication Sig Dispense Refill   amLODipine (NORVASC) 5 MG tablet Take 1 tablet (5 mg total) by mouth daily. 90 tablet 3   aspirin EC 81 MG tablet Take 81 mg by mouth daily.     atorvastatin (LIPITOR) 20 MG tablet Take 1 tablet (20 mg total) by mouth daily. 90 tablet 3   Continuous Blood Gluc Sensor (DEXCOM G6 SENSOR) MISC 1 Device by Other route See admin instructions. Change every 10 days. 9 each 3   Continuous Blood Gluc Transmit (DEXCOM G6 TRANSMITTER) MISC Use one transmitter once every 90 days to transmit blood sugars. 1 each 3   doxycycline (VIBRAMYCIN) 100 MG capsule Take 1 capsule by mouth twice daily 40 capsule 0   Glucagon 3 MG/DOSE POWD Place 3 mg into the nose once as needed for up to 1 dose. 1 each 11   insulin glargine (LANTUS SOLOSTAR) 100 UNIT/ML Solostar Pen Inject 4 Units into the skin daily. 15 mL PRN   insulin lispro (HUMALOG KWIKPEN) 100 UNIT/ML KwikPen Inject under skin 8-16 units before meals 3x a day 60 mL 3   Insulin Pen Needle 32G X 6 MM MISC 1 each by Does not apply route in the morning, at noon, and at bedtime. E10.52 270 each 3   lisinopril (ZESTRIL) 10 MG tablet Take 1 tablet by mouth once daily 90 tablet 0    Multiple Vitamin (MULTIVITAMIN WITH MINERALS) TABS tablet Take 1 tablet by mouth daily.     nitroGLYCERIN (NITRODUR - DOSED IN MG/24 HR) 0.2 mg/hr patch APPLY 1 PATCH TOPICALLY ONCE DAILY (Patient not taking: Reported on 09/28/2021) 30 patch 0   pantoprazole (PROTONIX) 40 MG tablet Take 1 tablet (40 mg total) by mouth daily. (Patient not taking: Reported on 06/22/2021) 30 tablet 3   SSD 1 % cream  APPLY  CREAM EXTERNALLY TO AFFECTED AREA ONCE DAILY 50 g 0   No current facility-administered medications on file prior to visit.   No Known Allergies Family History  Problem Relation Age of Onset   Hypertension Mother    Diabetes Mother    Cancer Mother    Prostate cancer Father    PE: BP (!) 140/78 (BP Location: Left Arm, Patient Position: Sitting, Cuff Size: Normal)   Pulse 80   Ht 6' (1.829 m)   Wt 172 lb 9.6 oz (78.3 kg)   SpO2 99%   BMI 23.41 kg/m  Wt Readings from Last 3 Encounters:  03/09/22 172 lb 9.6 oz (78.3 kg)  09/28/21 184 lb (83.5 kg)  09/19/21 184 lb (83.5 kg)   Constitutional: normal weight, in NAD Eyes: no exophthalmos ENT: + L>R thyromegaly, no cervical lymphadenopathy Cardiovascular: RRR, No MRG Respiratory: CTA B Musculoskeletal: + B leg amputations: R AKA, L transmetatarsal amputation Skin: no rashes Neurological: no tremor with outstretched hands  ASSESSMENT: 1. DM1, uncontrolled, with complications - PAD - s/p leg amputations - R BKA 2017, L TMT amputations 2016 - PN - CKD - DR  2. HL  3.  Thyromegaly  PLAN:  1. Patient with longstanding, uncontrolled, type 1 diabetes, on basal/bolus antidiabetic regimen with still poor control.  At last visit HbA1c was higher, at 8.0%, and sugars were very fluctuating.  We adjusted his regimen at that time.  I did advise him to inject different doses of Humalog depending on the size and consistency of his meals.  We increased his Lantus dose slightly. CGM interpretation: -At today's visit, we reviewed his CGM  downloads: It appears that 50% of values are in target range (goal >70%), while 39% are higher than 180 (goal <25%), and 11% are lower than 70 (goal <4%).  The calculated average blood sugar is 162.  The projected HbA1c for the next 3 months ([facility]) is 7.2%. 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  Also, he is very vague about how much insulin he injects).  Per his report, he is trying to inject insulin 15 minutes before each meal.  Since his sugars are dropping mostly after breakfast in the morning, I advised him to reduce the dose of insulin before this.  If sugars are increasing in the evening but this may be a consequence of sugars dropping too low between 9 AM and 12 PM.  For now, I advised him to continue the same dose of insulin before lunch and dinner and to reduce the amount of insulin before a snack to avoid further drops in blood pressure.  He is also correcting with 3 to 4 units of insulin and I advised him to only correct with 1 to 2 units.  For now, we will continue the same dose of Lantus.  I did suggest Tresiba, which is a better insulin for shift workers, but he would not want to switch to this. -I suggested a referral to the diabetes educator to go over the diet and also more advised about insulin adjustments. - I suggested to:  Patient Instructions  Please use: - Lantus 8 units at midnight - Humalog - 15 min before you start eating 4-6 units before b'fast 8-12 units before lunch and dinner 3-4 units before a snack 1-2 units for correction  Please schedule an appt with Antonieta Iba  to go over the sugar patterns and insulin doses, as well  as the diet. [phone].  Please return in 3-4 months.  - we checked his HbA1c: 6.9% (Lower) - advised to check sugars at different times of the day - 4x a day, rotating check times - advised for yearly eye exams >> he is UTD - return to clinic in 3-4 months  2.  HL -Reviewed the latest lipid panel from 06/2021: LDL above our target of less than 55 due to history of cardiovascular disease: Lab Results  Component Value Date   CHOL 172 06/22/2021   HDL 75.40 06/22/2021   LDLCALC 75 06/22/2021   TRIG 109.0 06/22/2021   CHOLHDL 2 06/22/2021  -He continues only on 20 mg daily without side effects  3. Thyromegaly -Detected on palpation today.  His left lobe is more prominent than the right -Reviewing the chart, he has not had thyroid investigation until now -Will check a thyroid ultrasound -At next visit, we will check the rest of his labs along with a TSH   Philemon Kingdom, MD PhD [facility] Endocrinology

## 2022-03-09 NOTE — Patient Instructions (Addendum)
Please use: - Lantus 8 units at midnight - Humalog - 15 min before you start eating 4-6 units before b'fast 8-12 units before lunch and dinner 3-4 units before a snack 1-2 units for correction  Please schedule an appt with Gordon Walker  to go over the sugar patterns and insulin doses, as well as the diet. [phone].  Please return in 3-4 months.

## 2022-03-12 ENCOUNTER — Other Ambulatory Visit: Payer: Self-pay

## 2022-03-12 MED ORDER — LANTUS SOLOSTAR 100 UNIT/ML ~~LOC~~ SOPN: PEN_INJECTOR | SUBCUTANEOUS | 99 refills | Status: DC

## 2022-03-16 ENCOUNTER — Other Ambulatory Visit: Payer: Self-pay

## 2022-03-16 MED ORDER — INSULIN LISPRO (1 UNIT DIAL) 100 UNIT/ML (KWIKPEN): PEN_INJECTOR | SUBCUTANEOUS | 3 refills | Status: DC

## 2022-03-28 ENCOUNTER — Ambulatory Visit: Payer: BC Managed Care – PPO | Admitting: Nutrition

## 2022-04-05 ENCOUNTER — Encounter: Payer: Self-pay | Admitting: Emergency Medicine

## 2022-04-05 ENCOUNTER — Ambulatory Visit: Payer: BC Managed Care – PPO | Admitting: Emergency Medicine

## 2022-04-05 VITALS — BP 128/76 | HR 68 | Temp 98.2°F | Ht 72.0 in | Wt 167.5 lb

## 2022-04-05 DIAGNOSIS — I152 Hypertension secondary to endocrine disorders: Secondary | ICD-10-CM | POA: Diagnosis not present

## 2022-04-05 DIAGNOSIS — E1169 Type 2 diabetes mellitus with other specified complication: Secondary | ICD-10-CM

## 2022-04-05 DIAGNOSIS — E1159 Type 2 diabetes mellitus with other circulatory complications: Secondary | ICD-10-CM | POA: Diagnosis not present

## 2022-04-05 DIAGNOSIS — E785 Hyperlipidemia, unspecified: Secondary | ICD-10-CM | POA: Diagnosis not present

## 2022-04-05 LAB — POCT GLYCOSYLATED HEMOGLOBIN (HGB A1C): Hemoglobin A1C: 6.7 % — AB (ref 4.0–5.6)

## 2022-04-05 NOTE — Assessment & Plan Note (Signed)
Stable.  Diet and nutrition discussed. Continue atorvastatin 20 mg daily. The 10-year ASCVD risk score (Arnett DK, et al., 2019) is: 16.3%   Values used to calculate the score:     Age: 57 years     Sex: Male     Is Non-Hispanic African American: Yes     Diabetic: Yes     Tobacco smoker: No     Systolic Blood Pressure: 0000000 mmHg     Is BP treated: Yes     HDL Cholesterol: 75.4 mg/dL     Total Cholesterol: 172 mg/dL

## 2022-04-05 NOTE — Assessment & Plan Note (Signed)
Well-controlled hypertension Continue lisinopril 10 mg daily and amlodipine 5 mg daily BP Readings from Last 3 Encounters:  04/05/22 128/76  03/09/22 (!) 140/78  09/28/21 128/64  Well-controlled diabetes with hemoglobin A1c of 6.7 Insulin-dependent.  Sees endocrinologist on a regular basis Last office visit notes reviewed. Diabetic medications handled by their office. Cardiovascular risk associated with hypertension and diabetes discussed Diet and nutrition discussed. Follow-up in 6 months.

## 2022-04-05 NOTE — Progress Notes (Signed)
Gordon Walker 57 y.o.   Chief Complaint  Patient presents with   Medical Management of Chronic Issues    25mth f/u appt, no concerns     HISTORY OF PRESENT ILLNESS: This is a 57y.o. male A1A here for follow-up of chronic medical problems diabetes and hypertension in particular Sees endocrinologist on a regular basis.  Last hemoglobin A1c 6.9. Recently lost his mother.  Still grieving. No other complaints or medical concerns today. Most recent endocrinologist office visit assessment and plan as follows:  ASSESSMENT: 1. DM1, uncontrolled, with complications - PAD - s/p leg amputations - R BKA 2017, L TMT amputations 2016 - PN - CKD - DR   2. HL   3.  Thyromegaly   PLAN:  1. Patient with longstanding, uncontrolled, type 1 diabetes, on basal/bolus antidiabetic regimen with still poor control.  At last visit HbA1c was higher, at 8.0%, and sugars were very fluctuating.  We adjusted his regimen at that time.  I did advise him to inject different doses of Humalog depending on the size and consistency of his meals.  We increased his Lantus dose slightly. CGM interpretation: -At today's visit, we reviewed his CGM downloads: It appears that 50% of values are in target range (goal >70%), while 39% are higher than 180 (goal <25%), and 11% are lower than 70 (goal <4%).  The calculated average blood sugar is 162.  The projected HbA1c for the next 3 months (GMI) is 7.2%. 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  Also, he is very vague about how much insulin he injects).  Per his report, he is trying to inject insulin 15 minutes before each meal.  Since his sugars are dropping mostly after breakfast in the morning, I advised him to reduce the dose of insulin before this.  If sugars are increasing in the evening but this may be a consequence of sugars dropping too low between 9 AM and 12 PM.  For now, I advised him to  continue the same dose of insulin before lunch and dinner and to reduce the amount of insulin before a snack to avoid further drops in blood pressure.  He is also correcting with 3 to 4 units of insulin and I advised him to only correct with 1 to 2 units.  For now, we will continue the same dose of Lantus.  I did suggest Tresiba, which is a better insulin for shift workers, but he would not want to switch to this. -I suggested a referral to the diabetes educator to go over the diet and also more advised about insulin adjustments. - I suggested to:  Patient Instructions  Please use: - Lantus 8 units at midnight - Humalog - 15 min before you start eating 4-6 units before b'fast 8-12 units before lunch and dinner 3-4 units before a snack 1-2 units for correction   Please schedule an appt with LAntonieta Iba to go over the sugar patterns and insulin doses, as well as the diet. [phone]   Please return in 3-4 months.   - we checked his HbA1c: 6.9% (Lower) - advised to check sugars at different times of the day - 4x a day, rotating check times - advised for yearly eye exams >> he is UTD - return to clinic in 3-4 months   2. HL -Reviewed the latest lipid panel from 06/2021: LDL above our target of less than 55 due to history of  cardiovascular disease: Recent Labs       Lab Results  Component Value Date    CHOL 172 06/22/2021    HDL 75.40 06/22/2021    LDLCALC 75 06/22/2021    TRIG 109.0 06/22/2021    CHOLHDL 2 06/22/2021    -He continues only on 20 mg daily without side effects   3. Thyromegaly -Detected on palpation today.  His left lobe is more prominent than the right -Reviewing the chart, he has not had thyroid investigation until now -Will check a thyroid ultrasound -At next visit, we will check the rest of his labs along with a TSH              Philemon Kingdom, MD PhD [facility] HPI   Prior to Admission medications   Medication Sig Start Date End Date  Taking? Authorizing Provider  amLODipine (NORVASC) 5 MG tablet Take 1 tablet (5 mg total) by mouth daily. 06/22/21 06/17/22 Yes SagardiaInes Bloomer, MD  aspirin EC 81 MG tablet Take 81 mg by mouth daily.   Yes [provider]  atorvastatin (LIPITOR) 20 MG tablet Take 1 tablet (20 mg total) by mouth daily. 06/22/21 06/17/22 Yes Fredna Stricker, Ines Bloomer, MD  Continuous Blood Gluc Sensor (DEXCOM G7 SENSOR) MISC 3 each by Does not apply route every 30 (thirty) days. Apply 1 sensor every 10 days 03/09/22  Yes Philemon Kingdom, MD  Glucagon 3 MG/DOSE POWD Place 3 mg into the nose once as needed for up to 1 dose. 09/19/21  Yes Philemon Kingdom, MD  insulin glargine (LANTUS SOLOSTAR) 100 UNIT/ML Solostar Pen 6-8 units at midnight 03/12/22  Yes Philemon Kingdom, MD  insulin lispro (HUMALOG KWIKPEN) 100 UNIT/ML KwikPen 4-6 units before b'fast 8-12 units before lunch and dinner 3-4 units before a snack 1-2 units for correction 03/16/22  Yes Philemon Kingdom, MD  Insulin Pen Needle 32G X 6 MM MISC 1 each by Does not apply route in the morning, at noon, and at bedtime. E10.52 06/05/19  Yes Renato Shin, MD  lisinopril (ZESTRIL) 10 MG tablet Take 1 tablet by mouth once daily 01/10/22  Yes Suri Tafolla, Ines Bloomer, MD  Multiple Vitamin (MULTIVITAMIN WITH MINERALS) TABS tablet Take 1 tablet by mouth daily.   Yes [provider]  SSD 1 % cream APPLY  CREAM EXTERNALLY TO AFFECTED AREA ONCE DAILY 09/26/21  Yes Marybelle Killings, MD  nitroGLYCERIN (NITRODUR - DOSED IN MG/24 HR) 0.2 mg/hr patch APPLY 1 PATCH TOPICALLY ONCE DAILY Patient not taking: Reported on 09/28/2021 02/11/20   Persons, Bevely Palmer, PA  pantoprazole (PROTONIX) 40 MG tablet Take 1 tablet (40 mg total) by mouth daily. Patient not taking: Reported on 06/22/2021 10/05/16   Mendel Corning, MD    No Known Allergies  Patient Active Problem List   Diagnosis Date Noted   Hyperlipidemia 09/19/2021   Hypertension associated with diabetes ([place]) 10/03/2016    Sludge in gallbladder    Status post below knee amputation of right lower extremity ([place]) 01/20/2016   Dyslipidemia associated with type 2 diabetes mellitus ([place])    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, with long-term current use of insulin ([place])    Anemia 01/06/2015    Past Medical History:  Diagnosis Date   Complication of anesthesia    anesthesia complication [id number]; pt unaware what happened    Dehiscence of amputation stump ([place])    right   Diabetes mellitus without complication (HCC)    Type 1  Heart murmur    "years ago"   Hypertension    "years ago"   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, with long-term current use of insulin [facility])     Past Surgical History:  Procedure Laterality Date   AMPUTATION Left 01/07/2015   Procedure: Left Foot 1st Ray Amputation;  Surgeon: Newt Minion, MD;  Location: [place];  Service: Orthopedics;  Laterality: Left;   AMPUTATION Left 07/15/2015   Procedure: Left Transmetatarsal Amputation;  Surgeon: Newt Minion, MD;  Location: [place];  Service: Orthopedics;  Laterality: Left;   AMPUTATION Right 12/30/2015   Procedure: 5th Ray Amputation Right Foot;  Surgeon: Newt Minion, MD;  Location: [place];  Service: Orthopedics;  Laterality: Right;   AMPUTATION Right 01/15/2016   Procedure: GUILLOTINE AMPUTATION  ABOVE THE ANKLE AMPUTATION;  Surgeon: Marybelle Killings, MD;  Location: [place];  Service: Orthopedics;  Laterality: Right;   AMPUTATION Right 01/18/2016   Procedure: AMPUTATION BELOW KNEE;  Surgeon: Marybelle Killings, MD;  Location: [place];  Service: Orthopedics;  Laterality: Right;   STUMP REVISION Right 03/21/2016   Procedure: Right Below Knee Amputation Stump Revision, WOUND VAC application;  Surgeon: Marybelle Killings, MD;  Location: [place];  Service: Orthopedics;  Laterality: Right;    Social History   Socioeconomic History   Marital status: Married    Spouse name: Not on file    Number of children: Not on file   Years of education: Not on file   Highest education level: Not on file  Occupational History   Not on file  Tobacco Use   Smoking status: Never   Smokeless tobacco: Never  Substance and Sexual Activity   Alcohol use: No   Drug use: No   Sexual activity: Not on file  Other Topics Concern   Not on file  Social History Narrative   Not on file   Social Determinants of Health   Financial Resource Strain: Not on file  Food Insecurity: Not on file  Transportation Needs: Not on file  Physical Activity: Not on file  Stress: Not on file  Social Connections: Not on file  Intimate Partner Violence: Not on file    Family History  Problem Relation Age of Onset   Hypertension Mother    Diabetes Mother    Cancer Mother    Prostate cancer Father      Review of Systems  Constitutional: Negative.  Negative for chills and fever.  HENT: Negative.  Negative for congestion and sore throat.   Respiratory: Negative.  Negative for cough and shortness of breath.   Cardiovascular: Negative.  Negative for chest pain and palpitations.  Gastrointestinal:  Negative for abdominal pain, diarrhea, nausea and vomiting.  Genitourinary: Negative.  Negative for dysuria and hematuria.  Skin: Negative.  Negative for rash.  Neurological: Negative.  Negative for dizziness and headaches.  All other systems reviewed and are negative.   Vitals:   04/05/22 1016  BP: 128/76  Pulse: 68  Temp: 98.2 F (36.8 C)  SpO2: 99%    Physical Exam Vitals reviewed.  Constitutional:      Appearance: Normal appearance.  HENT:     Head: Normocephalic.     Mouth/Throat:     Mouth: Mucous membranes are moist.     Pharynx: Oropharynx is clear.  Eyes:     Extraocular Movements: Extraocular movements intact.     Pupils: Pupils are equal, round, and reactive to light.  Cardiovascular:  Rate and Rhythm: Normal rate and regular rhythm.     Heart sounds: Normal heart sounds.   Pulmonary:     Effort: Pulmonary effort is normal.     Breath sounds: Normal breath sounds.  Abdominal:     Palpations: Abdomen is soft.     Tenderness: There is no abdominal tenderness.  Musculoskeletal:     Cervical back: No tenderness.  Lymphadenopathy:     Cervical: No cervical adenopathy.  Skin:    General: Skin is warm and dry.  Neurological:     General: No focal deficit present.     Mental Status: He is alert and oriented to person, place, and time.  Psychiatric:        Mood and Affect: Mood normal.        Behavior: Behavior normal.    Results for orders placed or performed in visit on 04/05/22 (from the past 24 hour(s))  POCT HgB A1C     Status: Abnormal   Collection Time: 04/05/22 10:33 AM  Result Value Ref Range   Hemoglobin A1C 6.7 (A) 4.0 - 5.6 %   HbA1c POC (<> result, manual entry)     HbA1c, POC (prediabetic range)     HbA1c, POC (controlled diabetic range)       ASSESSMENT & PLAN: A total of 42 minutes was spent with the patient and counseling/coordination of care regarding preparing for this visit, review of most recent office visit notes, review of most recent endocrinologist office visit notes, review of multiple chronic medical conditions and their management, review of all medications, cardiovascular risks associated with diabetes and hypertension, education on nutrition, prognosis, documentation and need for follow-up.  Problem List Items Addressed This Visit       Cardiovascular and Mediastinum   Hypertension associated with diabetes ([place]) - Primary    Well-controlled hypertension Continue lisinopril 10 mg daily and amlodipine 5 mg daily BP Readings from Last 3 Encounters:  04/05/22 128/76  03/09/22 (!) 140/78  09/28/21 128/64  Well-controlled diabetes with hemoglobin A1c of 6.7 Insulin-dependent.  Sees endocrinologist on a regular basis Last office visit notes reviewed. Diabetic medications handled by their office. Cardiovascular risk  associated with hypertension and diabetes discussed Diet and nutrition discussed. Follow-up in 6 months.       Relevant Orders   POCT HgB A1C (Completed)     Endocrine   Dyslipidemia associated with type 2 diabetes mellitus ([place])    Stable.  Diet and nutrition discussed. Continue atorvastatin 20 mg daily. The 10-year ASCVD risk score (Arnett DK, et al., 2019) is: 16.3%   Values used to calculate the score:     Age: 48 years     Sex: Male     Is Non-Hispanic African American: Yes     Diabetic: Yes     Tobacco smoker: No     Systolic Blood Pressure: 0000000 mmHg     Is BP treated: Yes     HDL Cholesterol: 75.4 mg/dL     Total Cholesterol: 172 mg/dL       Patient Instructions  Hypertension, Adult High blood pressure (hypertension) is when the force of blood pumping through the arteries is too strong. The arteries are the blood vessels that carry blood from the heart throughout the body. Hypertension forces the heart to work harder to pump blood and may cause arteries to become narrow or stiff. Untreated or uncontrolled hypertension can lead to a heart attack, heart failure, a stroke, kidney disease, and other problems. A  blood pressure reading consists of a higher number over a lower number. Ideally, your blood pressure should be below 120/80. The first ("top") number is called the systolic pressure. It is a measure of the pressure in your arteries as your heart beats. The second ("bottom") number is called the diastolic pressure. It is a measure of the pressure in your arteries as the heart relaxes. What are the causes? The exact cause of this condition is not known. There are some conditions that result in high blood pressure. What increases the risk? Certain factors may make you more likely to develop high blood pressure. Some of these risk factors are under your control, including: Smoking. Not getting enough exercise or physical activity. Being overweight. Having too much fat,  sugar, calories, or salt (sodium) in your diet. Drinking too much alcohol. Other risk factors include: Having a personal history of heart disease, diabetes, high cholesterol, or kidney disease. Stress. Having a family history of high blood pressure and high cholesterol. Having obstructive sleep apnea. Age. The risk increases with age. What are the signs or symptoms? High blood pressure may not cause symptoms. Very high blood pressure (hypertensive crisis) may cause: Headache. Fast or irregular heartbeats (palpitations). Shortness of breath. Nosebleed. Nausea and vomiting. Vision changes. Severe chest pain, dizziness, and seizures. How is this diagnosed? This condition is diagnosed by measuring your blood pressure while you are seated, with your arm resting on a flat surface, your legs uncrossed, and your feet flat on the floor. The cuff of the blood pressure monitor will be placed directly against the skin of your upper arm at the level of your heart. Blood pressure should be measured at least twice using the same arm. Certain conditions can cause a difference in blood pressure between your right and left arms. If you have a high blood pressure reading during one visit or you have normal blood pressure with other risk factors, you may be asked to: Return on a different day to have your blood pressure checked again. Monitor your blood pressure at home for 1 week or longer. If you are diagnosed with hypertension, you may have other blood or imaging tests to help your health care provider understand your overall risk for other conditions. How is this treated? This condition is treated by making healthy lifestyle changes, such as eating healthy foods, exercising more, and reducing your alcohol intake. You may be referred for counseling on a healthy diet and physical activity. Your health care provider may prescribe medicine if lifestyle changes are not enough to get your blood pressure under  control and if: Your systolic blood pressure is above 130. Your diastolic blood pressure is above 80. Your personal target blood pressure may vary depending on your medical conditions, your age, and other factors. Follow these instructions at home: Eating and drinking  Eat a diet that is high in fiber and potassium, and low in sodium, added sugar, and fat. An example of this eating plan is called the DASH diet. DASH stands for Dietary Approaches to Stop Hypertension. To eat this way: Eat plenty of fresh fruits and vegetables. Try to fill one half of your plate at each meal with fruits and vegetables. Eat whole grains, such as whole-wheat pasta, brown rice, or whole-grain bread. Fill about one fourth of your plate with whole grains. Eat or drink low-fat dairy products, such as skim milk or low-fat yogurt. Avoid fatty cuts of meat, processed or cured meats, and poultry with skin. Fill about one  fourth of your plate with lean proteins, such as fish, chicken without skin, beans, eggs, or tofu. Avoid pre-made and processed foods. These tend to be higher in sodium, added sugar, and fat. Reduce your daily sodium intake. Many people with hypertension should eat less than 1,500 mg of sodium a day. Do not drink alcohol if: Your health care provider tells you not to drink. You are pregnant, may be pregnant, or are planning to become pregnant. If you drink alcohol: Limit how much you have to: 0-1 drink a day for women. 0-2 drinks a day for men. Know how much alcohol is in your drink. In the [place] one drink equals one 12 oz bottle of beer (355 mL), one 5 oz glass of wine (148 mL), or one 1 oz glass of hard liquor (44 mL). Lifestyle  Work with your health care provider to maintain a healthy body weight or to lose weight. Ask what an ideal weight is for you. Get at least 30 minutes of exercise that causes your heart to beat faster (aerobic exercise) most days of the week. Activities may include walking,  swimming, or biking. Include exercise to strengthen your muscles (resistance exercise), such as Pilates or lifting weights, as part of your weekly exercise routine. Try to do these types of exercises for 30 minutes at least 3 days a week. Do not use any products that contain nicotine or tobacco. These products include cigarettes, chewing tobacco, and vaping devices, such as e-cigarettes. If you need help quitting, ask your health care provider. Monitor your blood pressure at home as told by your health care provider. Keep all follow-up visits. This is important. Medicines Take over-the-counter and prescription medicines only as told by your health care provider. Follow directions carefully. Blood pressure medicines must be taken as prescribed. Do not skip doses of blood pressure medicine. Doing this puts you at risk for problems and can make the medicine less effective. Ask your health care provider about side effects or reactions to medicines that you should watch for. Contact a health care provider if you: Think you are having a reaction to a medicine you are taking. Have headaches that keep coming back (recurring). Feel dizzy. Have swelling in your ankles. Have trouble with your vision. Get help right away if you: Develop a severe headache or confusion. Have unusual weakness or numbness. Feel faint. Have severe pain in your chest or abdomen. Vomit repeatedly. Have trouble breathing. These symptoms may be an emergency. Get help right away. Call 911. Do not wait to see if the symptoms will go away. Do not drive yourself to the hospital. Summary Hypertension is when the force of blood pumping through your arteries is too strong. If this condition is not controlled, it may put you at risk for serious complications. Your personal target blood pressure may vary depending on your medical conditions, your age, and other factors. For most people, a normal blood pressure is less than  120/80. Hypertension is treated with lifestyle changes, medicines, or a combination of both. Lifestyle changes include losing weight, eating a healthy, low-sodium diet, exercising more, and limiting alcohol. This information is not intended to replace advice given to you by your health care provider. Make sure you discuss any questions you have with your health care provider. Document Revised: 11/15/2020 Document Reviewed: 11/15/2020 Elsevier Patient Education  [place], MD [facility]

## 2022-04-05 NOTE — Patient Instructions (Signed)
Hypertension, Adult High blood pressure (hypertension) is when the force of blood pumping through the arteries is too strong. The arteries are the blood vessels that carry blood from the heart throughout the body. Hypertension forces the heart to work harder to pump blood and may cause arteries to become narrow or stiff. Untreated or uncontrolled hypertension can lead to a heart attack, heart failure, a stroke, kidney disease, and other problems. A blood pressure reading consists of a higher number over a lower number. Ideally, your blood pressure should be below 120/80. The first ("top") number is called the systolic pressure. It is a measure of the pressure in your arteries as your heart beats. The second ("bottom") number is called the diastolic pressure. It is a measure of the pressure in your arteries as the heart relaxes. What are the causes? The exact cause of this condition is not known. There are some conditions that result in high blood pressure. What increases the risk? Certain factors may make you more likely to develop high blood pressure. Some of these risk factors are under your control, including: Smoking. Not getting enough exercise or physical activity. Being overweight. Having too much fat, sugar, calories, or salt (sodium) in your diet. Drinking too much alcohol. Other risk factors include: Having a personal history of heart disease, diabetes, high cholesterol, or kidney disease. Stress. Having a family history of high blood pressure and high cholesterol. Having obstructive sleep apnea. Age. The risk increases with age. What are the signs or symptoms? High blood pressure may not cause symptoms. Very high blood pressure (hypertensive crisis) may cause: Headache. Fast or irregular heartbeats (palpitations). Shortness of breath. Nosebleed. Nausea and vomiting. Vision changes. Severe chest pain, dizziness, and seizures. How is this diagnosed? This condition is diagnosed by  measuring your blood pressure while you are seated, with your arm resting on a flat surface, your legs uncrossed, and your feet flat on the floor. The cuff of the blood pressure monitor will be placed directly against the skin of your upper arm at the level of your heart. Blood pressure should be measured at least twice using the same arm. Certain conditions can cause a difference in blood pressure between your right and left arms. If you have a high blood pressure reading during one visit or you have normal blood pressure with other risk factors, you may be asked to: Return on a different day to have your blood pressure checked again. Monitor your blood pressure at home for 1 week or longer. If you are diagnosed with hypertension, you may have other blood or imaging tests to help your health care provider understand your overall risk for other conditions. How is this treated? This condition is treated by making healthy lifestyle changes, such as eating healthy foods, exercising more, and reducing your alcohol intake. You may be referred for counseling on a healthy diet and physical activity. Your health care provider may prescribe medicine if lifestyle changes are not enough to get your blood pressure under control and if: Your systolic blood pressure is above 130. Your diastolic blood pressure is above 80. Your personal target blood pressure may vary depending on your medical conditions, your age, and other factors. Follow these instructions at home: Eating and drinking  Eat a diet that is high in fiber and potassium, and low in sodium, added sugar, and fat. An example of this eating plan is called the DASH diet. DASH stands for Dietary Approaches to Stop Hypertension. To eat this way: Eat   plenty of fresh fruits and vegetables. Try to fill one half of your plate at each meal with fruits and vegetables. Eat whole grains, such as whole-wheat pasta, brown rice, or whole-grain bread. Fill about one  fourth of your plate with whole grains. Eat or drink low-fat dairy products, such as skim milk or low-fat yogurt. Avoid fatty cuts of meat, processed or cured meats, and poultry with skin. Fill about one fourth of your plate with lean proteins, such as fish, chicken without skin, beans, eggs, or tofu. Avoid pre-made and processed foods. These tend to be higher in sodium, added sugar, and fat. Reduce your daily sodium intake. Many people with hypertension should eat less than 1,500 mg of sodium a day. Do not drink alcohol if: Your health care provider tells you not to drink. You are pregnant, may be pregnant, or are planning to become pregnant. If you drink alcohol: Limit how much you have to: 0-1 drink a day for women. 0-2 drinks a day for men. Know how much alcohol is in your drink. In the [place] one drink equals one 12 oz bottle of beer (355 mL), one 5 oz glass of wine (148 mL), or one 1 oz glass of hard liquor (44 mL). Lifestyle  Work with your health care provider to maintain a healthy body weight or to lose weight. Ask what an ideal weight is for you. Get at least 30 minutes of exercise that causes your heart to beat faster (aerobic exercise) most days of the week. Activities may include walking, swimming, or biking. Include exercise to strengthen your muscles (resistance exercise), such as Pilates or lifting weights, as part of your weekly exercise routine. Try to do these types of exercises for 30 minutes at least 3 days a week. Do not use any products that contain nicotine or tobacco. These products include cigarettes, chewing tobacco, and vaping devices, such as e-cigarettes. If you need help quitting, ask your health care provider. Monitor your blood pressure at home as told by your health care provider. Keep all follow-up visits. This is important. Medicines Take over-the-counter and prescription medicines only as told by your health care provider. Follow directions carefully. Blood  pressure medicines must be taken as prescribed. Do not skip doses of blood pressure medicine. Doing this puts you at risk for problems and can make the medicine less effective. Ask your health care provider about side effects or reactions to medicines that you should watch for. Contact a health care provider if you: Think you are having a reaction to a medicine you are taking. Have headaches that keep coming back (recurring). Feel dizzy. Have swelling in your ankles. Have trouble with your vision. Get help right away if you: Develop a severe headache or confusion. Have unusual weakness or numbness. Feel faint. Have severe pain in your chest or abdomen. Vomit repeatedly. Have trouble breathing. These symptoms may be an emergency. Get help right away. Call 911. Do not wait to see if the symptoms will go away. Do not drive yourself to the hospital. Summary Hypertension is when the force of blood pumping through your arteries is too strong. If this condition is not controlled, it may put you at risk for serious complications. Your personal target blood pressure may vary depending on your medical conditions, your age, and other factors. For most people, a normal blood pressure is less than 120/80. Hypertension is treated with lifestyle changes, medicines, or a combination of both. Lifestyle changes include losing weight, eating a healthy,   low-sodium diet, exercising more, and limiting alcohol. This information is not intended to replace advice given to you by your health care provider. Make sure you discuss any questions you have with your health care provider. Document Revised: 11/15/2020 Document Reviewed: 11/15/2020 [employer] Patient Education  2023 [employer]  

## 2022-04-13 ENCOUNTER — Other Ambulatory Visit: Payer: Self-pay | Admitting: Emergency Medicine

## 2022-04-13 DIAGNOSIS — I10 Essential (primary) hypertension: Secondary | ICD-10-CM

## 2022-06-08 ENCOUNTER — Other Ambulatory Visit: Payer: Self-pay | Admitting: Emergency Medicine

## 2022-06-08 DIAGNOSIS — I10 Essential (primary) hypertension: Secondary | ICD-10-CM

## 2022-06-08 DIAGNOSIS — E1052 Type 1 diabetes mellitus with diabetic peripheral angiopathy with gangrene: Secondary | ICD-10-CM

## 2022-06-29 ENCOUNTER — Telehealth: Payer: Self-pay | Admitting: Internal Medicine

## 2022-06-29 ENCOUNTER — Encounter: Payer: Self-pay | Admitting: Internal Medicine

## 2022-06-29 MED ORDER — DEXCOM G7 SENSOR MISC: 3.0000 | 1 refills | Status: DC

## 2022-06-29 NOTE — Addendum Note (Signed)
Addended by: Kenyon Ana on: 06/29/2022 01:13 PM   Modules accepted: Orders

## 2022-06-29 NOTE — Telephone Encounter (Signed)
Rx sent 

## 2022-06-29 NOTE — Telephone Encounter (Signed)
MEDICATION: Dexcom G7 Sensor Continuous Blood Gluc Sensor (DEXCOM G7 SENSOR) MISC  PHARMACY:    [id number] - , [place] - [address] (Ph: [phone])    HAS THE PATIENT CONT ACTED THEIR PHARMACY?  yes  IS THIS A 90 DAY SUPPLY : yes  IS PATIENT OUT OF MEDICATION: no  IF NOT; HOW MUCH IS LEFT: 1  LAST APPOINTMENT DATE: @8 /29/2023  NEXT APPOINTMENT DATE:@6 /25/2024  DO WE HAVE YOUR PERMISSION TO LEAVE A DETAILED MESSAGE?:yes  OTHER COMMENTS:    **Let patient know to contact pharmacy at the end of the day to make sure medication is ready. **  ** Please notify patient to allow 48-72 hours to process**  **Encourage patient to contact the pharmacy for refills or they can request refills through [facility]**

## 2022-07-03 ENCOUNTER — Telehealth: Payer: Self-pay

## 2022-07-03 ENCOUNTER — Other Ambulatory Visit (HOSPITAL_COMMUNITY): Payer: Self-pay

## 2022-07-03 NOTE — Telephone Encounter (Signed)
Patient Advocate Encounter   Received notification from [facility] that prior authorization is required for Dexcom G7 sensor  Submitted: 07/03/22 Key [id number]  Status is pending

## 2022-07-06 NOTE — Telephone Encounter (Signed)
Pharmacy Patient Advocate Encounter  Prior Authorization has been APPROVED  Effective through 07/03/2023

## 2022-07-17 ENCOUNTER — Ambulatory Visit: Payer: BC Managed Care – PPO | Admitting: Internal Medicine

## 2022-09-06 ENCOUNTER — Encounter (INDEPENDENT_AMBULATORY_CARE_PROVIDER_SITE_OTHER): Payer: Self-pay

## 2022-09-18 ENCOUNTER — Other Ambulatory Visit: Payer: Self-pay | Admitting: Emergency Medicine

## 2022-09-18 DIAGNOSIS — I10 Essential (primary) hypertension: Secondary | ICD-10-CM

## 2022-09-18 DIAGNOSIS — E1052 Type 1 diabetes mellitus with diabetic peripheral angiopathy with gangrene: Secondary | ICD-10-CM

## 2022-09-20 ENCOUNTER — Ambulatory Visit (INDEPENDENT_AMBULATORY_CARE_PROVIDER_SITE_OTHER): Payer: 59 | Admitting: Internal Medicine

## 2022-09-20 ENCOUNTER — Encounter: Payer: Self-pay | Admitting: Internal Medicine

## 2022-09-20 VITALS — BP 120/70 | HR 81 | Ht 72.0 in | Wt 184.0 lb

## 2022-09-20 DIAGNOSIS — Z794 Long term (current) use of insulin: Secondary | ICD-10-CM | POA: Diagnosis not present

## 2022-09-20 DIAGNOSIS — E010 Iodine-deficiency related diffuse (endemic) goiter: Secondary | ICD-10-CM | POA: Diagnosis not present

## 2022-09-20 DIAGNOSIS — E1052 Type 1 diabetes mellitus with diabetic peripheral angiopathy with gangrene: Secondary | ICD-10-CM

## 2022-09-20 DIAGNOSIS — E785 Hyperlipidemia, unspecified: Secondary | ICD-10-CM

## 2022-09-20 LAB — POCT GLYCOSYLATED HEMOGLOBIN (HGB A1C): Hemoglobin A1C: 7.1 % — AB (ref 4.0–5.6)

## 2022-09-20 MED ORDER — INSULIN PEN NEEDLE 32G X 4 MM MISC: 3 refills | Status: DC

## 2022-09-20 MED ORDER — INSULIN LISPRO (1 UNIT DIAL) 100 UNIT/ML (KWIKPEN): PEN_INJECTOR | SUBCUTANEOUS | 3 refills | Status: DC

## 2022-09-20 NOTE — Progress Notes (Signed)
Patient ID: Gordon Walker, male   DOB: [birth year], 57 y.o.   MRN: [id number]  HPI: Gordon Walker is a 57 y.o.-year-old male, returning for follow-up for DM1, dx in 1989, insulin-dependent since dx, uncontrolled, with complications (PAD, CKD, DR, bilateral leg amputations, PN). Pt. previously saw Dr. Everardo All, but last visit with me 6 months ago.  Interim history: No increased urination, blurry vision, nausea, chest pain.  Reviewed HbA1c: Lab Results  Component Value Date   HGBA1C 6.7 (A) 04/05/2022   HGBA1C 8.0 (A) 09/19/2021   HGBA1C 7.4 (A) 05/25/2021   HGBA1C 9.1 (A) 03/16/2021   HGBA1C 7.4 (A) 10/11/2020   HGBA1C 7.2 (A) 06/06/2020   HGBA1C 6.6 (A) 12/08/2019   HGBA1C 7.2 (A) 09/07/2019   HGBA1C 5.7 (A) 06/05/2019   HGBA1C 6.1 (A) 04/06/2019   He was previously on: - Lantus 4-6 units at midnight - Humalog (16)-(16)-(8-12) units 3x a day usually after he starts eating Review of Dr. George Hugh note, he refused an insulin pump in the past.  I advised him to change to: - Lantus 6-8 >> 8 >> 6-8 units at midnight (work) - Humalog 8-16 >> 8 units before meals, 4-5 units before a snack >> 15 min before you start eating 4-6 units before b'fast 8-12 units before lunch and dinner 3-4 units before a snack 1-2 >> but actually using 3-5 units for correction  Pt checks his sugars >4x a day with his Dexcom CGM and they are:   Previously:  Previously:   Lowest sugar was 55 >> 50s >> 40s; he has hypoglycemia awareness at 70.  Highest sugar was >350 >> 400s >> 400s.  Glucometer: Relion  Pt's meals are: - Breakfast (5 pm): fast food (on the way to work) or sandwich -  - Lunch: take out or home cooked - Dinner: take out or home cooked - Snacks: at night  - + CKD, last BUN/creatinine:  Lab Results  Component Value Date   BUN 20 06/22/2021   BUN 28 (A) 09/17/2018   CREATININE 1.18 06/22/2021   CREATININE 1.2 09/17/2018  No results found for: "MICRALBCREAT" He is on  lisinopril 10 mg daily.  - + HL; last set of lipids: Lab Results  Component Value Date   CHOL 172 06/22/2021   HDL 75.40 06/22/2021   LDLCALC 75 06/22/2021   TRIG 109.0 06/22/2021   CHOLHDL 2 06/22/2021  He is on Lipitor 20 mg daily  - last eye exam was in 11/2021: + DR (severe), + glaucoma. He had laser Sx 2015, 2016.  - Last foot exam 10/11/2020. He had R BKA and L transmetatarsal amputation.   He also has a history of HTN.  No results found for: "TSH"  He works 6 PM to 6 AM 2-3 times a week.  ROS: + see HPI  Past Medical History:  Diagnosis Date   Complication of anesthesia    anesthesia complication 01/18/16; pt unaware what happened    Dehiscence of amputation stump (HCC)    right   Diabetes mellitus without complication (HCC)    Type 1   Heart murmur    "years ago"   Hypertension    "years ago"   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, with long-term current use of insulin (HCC)    Past Surgical History:  Procedure Laterality Date   AMPUTATION Left 01/07/2015   Procedure: Left Foot 1st Ray Amputation;  Surgeon: Nadara Mustard, MD;  Location: [facility] OR;  Service: Orthopedics;  Laterality: Left;   AMPUTATION Left 07/15/2015   Procedure: Left Transmetatarsal Amputation;  Surgeon: Nadara Mustard, MD;  Location: [facility] OR;  Service: Orthopedics;  Laterality: Left;   AMPUTATION Right 12/30/2015   Procedure: 5th Ray Amputation Right Foot;  Surgeon: Nadara Mustard, MD;  Location: [facility] OR;  Service: Orthopedics;  Laterality: Right;   AMPUTATION Right 01/15/2016   Procedure: GUILLOTINE AMPUTATION  ABOVE THE ANKLE AMPUTATION;  Surgeon: Eldred Manges, MD;  Location: [facility] OR;  Service: Orthopedics;  Laterality: Right;   AMPUTATION Right 01/18/2016   Procedure: AMPUTATION BELOW KNEE;  Surgeon: Eldred Manges, MD;  Location: [facility] OR;  Service: Orthopedics;  Laterality: Right;   STUMP REVISION Right 03/21/2016   Procedure: Right Below Knee Amputation Stump  Revision, WOUND VAC application;  Surgeon: Eldred Manges, MD;  Location: [facility] OR;  Service: Orthopedics;  Laterality: Right;   Social History   Socioeconomic History   Marital status: Married    Spouse name: Not on file   Number of children: Not on file   Years of education: Not on file   Highest education level: Not on file  Occupational History   Not on file  Tobacco Use   Smoking status: Never   Smokeless tobacco: Never  Substance and Sexual Activity   Alcohol use: No   Drug use: No   Sexual activity: Not on file  Other Topics Concern   Not on file  Social History Narrative   Not on file   Social Determinants of Health   Financial Resource Strain: Not on file  Food Insecurity: Not on file  Transportation Needs: Not on file  Physical Activity: Not on file  Stress: Not on file  Social Connections: Not on file  Intimate Partner Violence: Not on file   Current Outpatient Medications on File Prior to Visit  Medication Sig Dispense Refill   amLODipine (NORVASC) 5 MG tablet Take 1 tablet by mouth once daily 90 tablet 3   aspirin EC 81 MG tablet Take 81 mg by mouth daily.     atorvastatin (LIPITOR) 20 MG tablet Take 1 tablet by mouth once daily 90 tablet 3   Continuous Glucose Sensor (DEXCOM G7 SENSOR) MISC 3 each by Does not apply route every 30 (thirty) days. Apply 1 sensor every 10 days 9 each 1   Glucagon 3 MG/DOSE POWD Place 3 mg into the nose once as needed for up to 1 dose. 1 each 11   insulin glargine (LANTUS SOLOSTAR) 100 UNIT/ML Solostar Pen 6-8 units at midnight 15 mL PRN   insulin lispro (HUMALOG KWIKPEN) 100 UNIT/ML KwikPen 4-6 units before b'fast 8-12 units before lunch and dinner 3-4 units before a snack 1-2 units for correction 60 mL 3   Insulin Pen Needle 32G X 6 MM MISC 1 each by Does not apply route in the morning, at noon, and at bedtime. E10.52 270 each 3   lisinopril (ZESTRIL) 10 MG tablet Take 1 tablet by mouth once daily 90 tablet 3   Multiple Vitamin  (MULTIVITAMIN WITH MINERALS) TABS tablet Take 1 tablet by mouth daily.     nitroGLYCERIN (NITRODUR - DOSED IN MG/24 HR) 0.2 mg/hr patch APPLY 1 PATCH TOPICALLY ONCE DAILY (Patient not taking: Reported on 09/28/2021) 30 patch 0   pantoprazole (PROTONIX) 40 MG tablet Take 1 tablet (40 mg total) by mouth daily. (Patient not taking: Reported on 06/22/2021) 30 tablet 3   SSD 1 % cream APPLY  CREAM EXTERNALLY TO AFFECTED AREA  ONCE DAILY 50 g 0   No current facility-administered medications on file prior to visit.   No Known Allergies Family History  Problem Relation Age of Onset   Hypertension Mother    Diabetes Mother    Cancer Mother    Prostate cancer Father    PE: There were no vitals taken for this visit. Wt Readings from Last 3 Encounters:  04/05/22 167 lb 8 oz (76 kg)  03/09/22 172 lb 9.6 oz (78.3 kg)  09/28/21 184 lb (83.5 kg)   Constitutional: normal weight, in NAD Eyes: no exophthalmos ENT: + L>R thyromegaly, no cervical lymphadenopathy Cardiovascular: RRR, No MRG Respiratory: CTA B Musculoskeletal: + B leg amputations: R AKA, L transmetatarsal amputation Skin: no rashes Neurological: no tremor with outstretched hands  ASSESSMENT: 1. DM1, uncontrolled, with complications - PAD - s/p leg amputations - R BKA 2017, L TMT amputations 2016 - PN - CKD - DR  2. HL  3.  Thyromegaly  PLAN:  1. Patient with longstanding uncontrolled, type 1 diabetes, basal/bolus insulin regimen, with improving control.  When I first saw the patient, his HbA1c was 8.0%.  After adjusting his insulin regimen, and HbA1c decreased to 6.9%.  Latest HbA1c obtained 5 months ago was low, at 6.7%. 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  He was also very vague about how much insulin he was injecting.  He was trying to inject 15 minutes before each meal.  Since sugars were dropping mostly after breakfast, I advised him to reduce the dose of  insulin before this meal.  Sugars were increasing in the evening, but this could have been a consequence of the previous low blood sugars between 9 AM and 12 PM.  Therefore, I did not advise him to change insulin before lunch and dinner.  He was correcting hyperglycemia with 3 to 4 units of insulin and I advised him to only correct with 1 to 2 units.  We did not change the Lantus dose.  I did suggest Vascepa, which is a better insulin for shift workers, but he preferred to stay on the Lantus.  I also suggested a referral to the nutritionist/diabetes education to go over the diet also more advised about insulin adjustments.  He did not have this appointment yet as she was very busy since last visit after the death of his mother. CGM interpretation: -At today's visit, we reviewed his CGM downloads: It appears that 47% of values are in target range (goal >70%), while 37% are higher than 180 (goal <25%), and 16% are lower than 70 (goal <4%).  The calculated average blood sugar is 159.  The projected HbA1c for the next 3 months (GMI) is 7.1%. -Reviewing the CGM trends, sugars are very fluctuating, with more low blood sugars in the last 2 weeks compared to the previous 2 weeks, mostly in the second half of the night and also between approximately 10 AM and 7 PM.  Upon questioning, he is using a higher dose of Humalog for correction, instead of using 1 to 2 units, he is using 3 to 5 units.  We discussed about adjusting the dose down.  Also, we discussed about taking a lower dose of insulin before breakfast with less carbs, like this morning, decreasing the dose of Humalog before lunch since sugars can be low after this meal, but increasing the dose before dinner, since sugars are usually high after this meal, prompting him to go  in and correct in the middle of the night.  He does mention that he takes them insulin 15 minutes before meals.  He is taking 6 to 8 units of Lantus in the middle of the night, which we will  continue. -I discussed with him about trying an insulin pump but he would not want to use this. - I suggested to:  Patient Instructions  Please use: - Lantus 8 units at midnight - Humalog - 15 min before you start eating 4-6 units before b'fast 6-8 units before lunch 10-12 units before dinner 3-4 units before a snack 1-2 units for correction  Please schedule an appt with Oran Rein  to go over the sugar patterns and insulin doses, as well as the diet. [phone].  Please return in 3-4 months.  - we checked his HbA1c: 7.1% (higher) - advised to check sugars at different times of the day - 4x a day, rotating check times - advised for yearly eye exams >> he is UTD - will check an ACR today - return to clinic in 3-4 months  2. HL -Reviewed latest lipid panel from 06/2021: LDL above our target of less than 55 due to history of cardiovascular disease, otherwise fractions at goal: Lab Results  Component Value Date   CHOL 172 06/22/2021   HDL 75.40 06/22/2021   LDLCALC 75 06/22/2021   TRIG 109.0 06/22/2021   CHOLHDL 2 06/22/2021  -He is not on a statin... -He is due for another lipid panel -he has an appointment coming up with PCP next month  3. Thyromegaly -This was detected on palpation at last visit: Left thyroid lobe was more prominent than the right. -I ordered a thyroid ultrasound for him but he did not have this yet -Will check this now   Carlus Pavlov, MD PhD [facility]

## 2022-09-20 NOTE — Patient Instructions (Addendum)
Please use: - Lantus 8 units at midnight - Humalog - 15 min before you start eating 4-6 units before b'fast 6-8 units before lunch 10-12 units before dinner 3-4 units before a snack 1-2 units for correction  Please schedule an appt with Oran Rein  to go over the sugar patterns and insulin doses, as well as the diet. [phone].  Please return in 3-4 months.

## 2022-09-21 LAB — MICROALBUMIN / CREATININE URINE RATIO
Creatinine,U: 191.5 mg/dL
Microalb Creat Ratio: 2.1 mg/g (ref 0.0–30.0)
Microalb, Ur: 4 mg/dL — ABNORMAL HIGH (ref 0.0–1.9)

## 2022-09-21 NOTE — Addendum Note (Signed)
Addended by: Pollie Meyer on: 09/21/2022 01:26 PM   Modules accepted: Orders

## 2022-09-23 ENCOUNTER — Other Ambulatory Visit: Payer: Self-pay | Admitting: Internal Medicine

## 2022-09-26 ENCOUNTER — Other Ambulatory Visit: Payer: Self-pay | Admitting: Internal Medicine

## 2022-09-26 MED ORDER — FIASP FLEXTOUCH 100 UNIT/ML ~~LOC~~ SOPN: PEN_INJECTOR | SUBCUTANEOUS | 3 refills | Status: DC

## 2022-09-26 NOTE — Telephone Encounter (Signed)
Insurance does not cover Humalog, ok to switch to Novolog?

## 2022-10-10 ENCOUNTER — Ambulatory Visit: Payer: BC Managed Care – PPO | Admitting: Emergency Medicine

## 2022-10-15 ENCOUNTER — Ambulatory Visit (INDEPENDENT_AMBULATORY_CARE_PROVIDER_SITE_OTHER): Payer: 59 | Admitting: Emergency Medicine

## 2022-10-15 VITALS — BP 132/86 | HR 81 | Temp 98.6°F | Ht 72.0 in | Wt 184.4 lb

## 2022-10-15 DIAGNOSIS — E785 Hyperlipidemia, unspecified: Secondary | ICD-10-CM

## 2022-10-15 DIAGNOSIS — Z1211 Encounter for screening for malignant neoplasm of colon: Secondary | ICD-10-CM

## 2022-10-15 DIAGNOSIS — E1169 Type 2 diabetes mellitus with other specified complication: Secondary | ICD-10-CM

## 2022-10-15 DIAGNOSIS — I152 Hypertension secondary to endocrine disorders: Secondary | ICD-10-CM | POA: Diagnosis not present

## 2022-10-15 DIAGNOSIS — R195 Other fecal abnormalities: Secondary | ICD-10-CM | POA: Insufficient documentation

## 2022-10-15 DIAGNOSIS — Z794 Long term (current) use of insulin: Secondary | ICD-10-CM

## 2022-10-15 DIAGNOSIS — E1159 Type 2 diabetes mellitus with other circulatory complications: Secondary | ICD-10-CM

## 2022-10-15 DIAGNOSIS — Z23 Encounter for immunization: Secondary | ICD-10-CM | POA: Diagnosis not present

## 2022-10-15 DIAGNOSIS — Z89511 Acquired absence of right leg below knee: Secondary | ICD-10-CM

## 2022-10-15 LAB — COMPREHENSIVE METABOLIC PANEL
ALT: 31 U/L (ref 0–53)
AST: 33 U/L (ref 0–37)
Albumin: 3.9 g/dL (ref 3.5–5.2)
Alkaline Phosphatase: 89 U/L (ref 39–117)
BUN: 17 mg/dL (ref 6–23)
CO2: 27 mEq/L (ref 19–32)
Calcium: 9.5 mg/dL (ref 8.4–10.5)
Chloride: 104 mEq/L (ref 96–112)
Creatinine, Ser: 1.17 mg/dL (ref 0.40–1.50)
GFR: 70.39 mL/min (ref >60.00)
Glucose, Bld: 54 mg/dL — ABNORMAL LOW (ref 70–99)
Potassium: 3.5 mEq/L (ref 3.5–5.1)
Sodium: 140 mEq/L (ref 135–145)
Total Bilirubin: 0.6 mg/dL (ref 0.2–1.2)
Total Protein: 7.6 g/dL (ref 6.0–8.3)

## 2022-10-15 LAB — LIPID PANEL
Cholesterol: 123 mg/dL (ref 0–200)
HDL: 68.1 mg/dL (ref >39.00)
LDL Cholesterol: 44 mg/dL (ref 0–99)
NonHDL: 54.51
Total CHOL/HDL Ratio: 2
Triglycerides: 54 mg/dL (ref 0.0–149.0)
VLDL: 10.8 mg/dL (ref 0.0–40.0)

## 2022-10-15 NOTE — Assessment & Plan Note (Signed)
BP Readings from Last 3 Encounters:  10/15/22 132/86  09/20/22 120/70  04/05/22 128/76  Well-controlled hypertension Continue amlodipine 5 mg and lisinopril 10 mg daily Well-controlled diabetes Continue insulin regimen as per endocrinologist's recommendation Diet and nutrition discussed Cardiovascular risks associated with hypertension and diabetes discussed Blood work done today Follow-up in 6 months

## 2022-10-15 NOTE — Assessment & Plan Note (Signed)
Chronic stable conditions Presently on atorvastatin 20 mg daily Lipid profile done today Diet and nutrition discussed The 10-year ASCVD risk score (Arnett DK, et al., 2019) is: 17.9%   Values used to calculate the score:     Age: 57 years     Sex: Male     Is Non-Hispanic African American: Yes     Diabetic: Yes     Tobacco smoker: No     Systolic Blood Pressure: 132 mmHg     Is BP treated: Yes     HDL Cholesterol: 75.4 mg/dL     Total Cholesterol: 172 mg/dL

## 2022-10-15 NOTE — Patient Instructions (Signed)

## 2022-10-15 NOTE — Progress Notes (Signed)
Gordon Walker 57 y.o.   Chief Complaint  Patient presents with   Medical Management of Chronic Issues    f/u appt, no concerns     HISTORY OF PRESENT ILLNESS: This is a 57 y.o. male here for 24-month follow-up of diabetes, hypertension, and dyslipidemia Overall doing well.  Has no complaints or medical concerns today. Lab Results  Component Value Date   HGBA1C 7.1 (A) 09/20/2022   BP Readings from Last 3 Encounters:  09/20/22 120/70  04/05/22 128/76  03/09/22 (!) 140/78   Wt Readings from Last 3 Encounters:  10/15/22 184 lb 6 oz (83.6 kg)  09/20/22 184 lb (83.5 kg)  04/05/22 167 lb 8 oz (76 kg)     Most recent endocrinologist office visit assessment and plan as follows: ASSESSMENT: 1. DM1, uncontrolled, with complications - PAD - s/p leg amputations - R BKA 2017, L TMT amputations 2016 - PN - CKD - DR   2. HL   3.  Thyromegaly   PLAN:  1. Patient with longstanding uncontrolled, type 1 diabetes, basal/bolus insulin regimen, with improving control.  When I first saw the patient, his HbA1c was 8.0%.  After adjusting his insulin regimen, and HbA1c decreased to 6.9%.  Latest HbA1c obtained 5 months ago was low, at 6.7%. 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  He was also very vague about how much insulin he was injecting.  He was trying to inject 15 minutes before each meal.  Since sugars were dropping mostly after breakfast, I advised him to reduce the dose of insulin before this meal.  Sugars were increasing in the evening, but this could have been a consequence of the previous low blood sugars between 9 AM and 12 PM.  Therefore, I did not advise him to change insulin before lunch and dinner.  He was correcting hyperglycemia with 3 to 4 units of insulin and I advised him to only correct with 1 to 2 units.  We did not change the Lantus dose.  I did suggest Vascepa, which is a better insulin for shift  workers, but he preferred to stay on the Lantus.  I also suggested a referral to the nutritionist/diabetes education to go over the diet also more advised about insulin adjustments.  He did not have this appointment yet as she was very busy since last visit after the death of his mother. CGM interpretation: -At today's visit, we reviewed his CGM downloads: It appears that 47% of values are in target range (goal >70%), while 37% are higher than 180 (goal <25%), and 16% are lower than 70 (goal <4%).  The calculated average blood sugar is 159.  The projected HbA1c for the next 3 months (GMI) is 7.1%. -Reviewing the CGM trends, sugars are very fluctuating, with more low blood sugars in the last 2 weeks compared to the previous 2 weeks, mostly in the second half of the night and also between approximately 10 AM and 7 PM.  Upon questioning, he is using a higher dose of Humalog for correction, instead of using 1 to 2 units, he is using 3 to 5 units.  We discussed about adjusting the dose down.  Also, we discussed about taking a lower dose of insulin before breakfast with less carbs, like this morning, decreasing the dose of Humalog before lunch since sugars can be low after this meal, but increasing the dose before dinner, since sugars are usually high after this  meal, prompting him to go in and correct in the middle of the night.  He does mention that he takes them insulin 15 minutes before meals.  He is taking 6 to 8 units of Lantus in the middle of the night, which we will continue. -I discussed with him about trying an insulin pump but he would not want to use this. - I suggested to:  Patient Instructions  Please use: - Lantus 8 units at midnight - Humalog - 15 min before you start eating 4-6 units before b'fast 6-8 units before lunch 10-12 units before dinner 3-4 units before a snack 1-2 units for correction   Please schedule an appt with Oran Rein  to go over the sugar patterns and insulin doses, as  well as the diet. [phone].   Please return in 3-4 months.   - we checked his HbA1c: 7.1% (higher) - advised to check sugars at different times of the day - 4x a day, rotating check times - advised for yearly eye exams >> he is UTD - will check an ACR today - return to clinic in 3-4 months   2. HL -Reviewed latest lipid panel from 06/2021: LDL above our target of less than 55 due to history of cardiovascular disease, otherwise fractions at goal: Recent Labs       Lab Results  Component Value Date    CHOL 172 06/22/2021    HDL 75.40 06/22/2021    LDLCALC 75 06/22/2021    TRIG 109.0 06/22/2021    CHOLHDL 2 06/22/2021    -He is not on a statin... -He is due for another lipid panel -he has an appointment coming up with PCP next month   3. Thyromegaly -This was detected on palpation at last visit: Left thyroid lobe was more prominent than the right. -I ordered a thyroid ultrasound for him but he did not have this yet -Will check this now              Carlus Pavlov, MD PhD [facility]  HPI   Prior to Admission medications   Medication Sig Start Date End Date Taking? Authorizing Provider  amLODipine (NORVASC) 5 MG tablet Take 1 tablet by mouth once daily 09/18/22  Yes [place], Eilleen Kempf, MD  aspirin EC 81 MG tablet Take 81 mg by mouth daily.   Yes [provider]  atorvastatin (LIPITOR) 20 MG tablet Take 1 tablet by mouth once daily 09/18/22  Yes Cartier Washko, Eilleen Kempf, MD  Continuous Glucose Sensor (DEXCOM G7 SENSOR) MISC 3 each by Does not apply route every 30 (thirty) days. Apply 1 sensor every 10 days 06/29/22  Yes Carlus Pavlov, MD  Glucagon 3 MG/DOSE POWD Place 3 mg into the nose once as needed for up to 1 dose. 09/19/21  Yes Carlus Pavlov, MD  insulin glargine (LANTUS SOLOSTAR) 100 UNIT/ML Solostar Pen 6-8 units at midnight 03/12/22  Yes Carlus Pavlov, MD  insulin lispro (HUMALOG KWIKPEN) 100 UNIT/ML KwikPen Inject up to 30 units under  skin in a day as advised 09/20/22  Yes Carlus Pavlov, MD  Insulin Pen Needle 32G X 4 MM MISC Use 4x a day 09/20/22  Yes Carlus Pavlov, MD  lisinopril (ZESTRIL) 10 MG tablet Take 1 tablet by mouth once daily 04/13/22  Yes Kara Melching, Eilleen Kempf, MD  Multiple Vitamin (MULTIVITAMIN WITH MINERALS) TABS tablet Take 1 tablet by mouth daily.   Yes [provider]  nitroGLYCERIN (NITRODUR - DOSED IN MG/24 HR) 0.2 mg/hr patch APPLY 1  PATCH TOPICALLY ONCE DAILY 02/11/20  Yes Persons, [place], PA  SSD 1 % cream APPLY  CREAM EXTERNALLY TO AFFECTED AREA ONCE DAILY 09/26/21  Yes Eldred Manges, MD  insulin aspart (FIASP FLEXTOUCH) 100 UNIT/ML FlexTouch Pen Inject up to 30 units a day, as advised.  Please inject right before each meal. 09/26/22   Carlus Pavlov, MD  pantoprazole (PROTONIX) 40 MG tablet Take 1 tablet (40 mg total) by mouth daily. Patient not taking: Reported on 10/15/2022 10/05/16   Cathren Harsh, MD    No Known Allergies  Patient Active Problem List   Diagnosis Date Noted   Hyperlipidemia 09/19/2021   Hypertension associated with diabetes (HCC) 10/03/2016   Sludge in gallbladder    Status post below knee amputation of right lower extremity (HCC) 01/20/2016   Dyslipidemia associated with type 2 diabetes mellitus (HCC)    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, with long-term current use of insulin (HCC)    Anemia 01/06/2015    Past Medical History:  Diagnosis Date   Complication of anesthesia    anesthesia complication 01/18/16; pt unaware what happened    Dehiscence of amputation stump (HCC)    right   Diabetes mellitus without complication (HCC)    Type 1   Heart murmur    "years ago"   Hypertension    "years ago"   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, with long-term current use of insulin (HCC)     Past Surgical History:  Procedure Laterality Date   AMPUTATION Left 01/07/2015    Procedure: Left Foot 1st Ray Amputation;  Surgeon: Nadara Mustard, MD;  Location: [facility] OR;  Service: Orthopedics;  Laterality: Left;   AMPUTATION Left 07/15/2015   Procedure: Left Transmetatarsal Amputation;  Surgeon: Nadara Mustard, MD;  Location: [facility] OR;  Service: Orthopedics;  Laterality: Left;   AMPUTATION Right 12/30/2015   Procedure: 5th Ray Amputation Right Foot;  Surgeon: Nadara Mustard, MD;  Location: [facility] OR;  Service: Orthopedics;  Laterality: Right;   AMPUTATION Right 01/15/2016   Procedure: GUILLOTINE AMPUTATION  ABOVE THE ANKLE AMPUTATION;  Surgeon: Eldred Manges, MD;  Location: [facility] OR;  Service: Orthopedics;  Laterality: Right;   AMPUTATION Right 01/18/2016   Procedure: AMPUTATION BELOW KNEE;  Surgeon: Eldred Manges, MD;  Location: [facility] OR;  Service: Orthopedics;  Laterality: Right;   STUMP REVISION Right 03/21/2016   Procedure: Right Below Knee Amputation Stump Revision, WOUND VAC application;  Surgeon: Eldred Manges, MD;  Location: [facility] OR;  Service: Orthopedics;  Laterality: Right;    Social History   Socioeconomic History   Marital status: Married    Spouse name: Not on file   Number of children: Not on file   Years of education: Not on file   Highest education level: Master's degree (e.g., MA, MS, MEng, MEd, MSW, MBA)  Occupational History   Not on file  Tobacco Use   Smoking status: Never   Smokeless tobacco: Never  Substance and Sexual Activity   Alcohol use: No   Drug use: No   Sexual activity: Not on file  Other Topics Concern   Not on file  Social History Narrative   Not on file   Social Determinants of Health   Financial Resource Strain: Low Risk  (10/15/2022)   Overall Financial Resource Strain (CARDIA)    Difficulty of Paying Living Expenses: Not very hard  Food Insecurity: No Food Insecurity (10/15/2022)  Hunger Vital Sign    Worried About Running Out of Food in the Last Year: Never true    Ran Out of Food in the Last Year: Never true  Transportation Needs: No  Transportation Needs (10/15/2022)   PRAPARE - Administrator, Civil Service (Medical): No    Lack of Transportation (Non-Medical): No  Physical Activity: Insufficiently Active (10/15/2022)   Exercise Vital Sign    Days of Exercise per Week: 2 days    Minutes of Exercise per Session: 30 min  Stress: No Stress Concern Present (10/15/2022)   [employer] of Occupational Health - Occupational Stress Questionnaire    Feeling of Stress : Only a little  Social Connections: Unknown (10/15/2022)   Social Connection and Isolation Panel [NHANES]    Frequency of Communication with Friends and Family: Three times a week    Frequency of Social Gatherings with Friends and Family: Twice a week    Attends Religious Services: Patient declined    Database administrator or Organizations: No    Attends Engineer, structural: Not on file    Marital Status: Married  Catering manager Violence: Not on file    Family History  Problem Relation Age of Onset   Hypertension Mother    Diabetes Mother    Cancer Mother    Prostate cancer Father      Review of Systems  Constitutional: Negative.  Negative for chills and fever.  HENT: Negative.  Negative for congestion and sore throat.   Respiratory: Negative.  Negative for cough and shortness of breath.   Cardiovascular: Negative.  Negative for chest pain and palpitations.  Gastrointestinal:  Negative for abdominal pain, diarrhea, nausea and vomiting.  Genitourinary: Negative.  Negative for dysuria and hematuria.  Skin: Negative.  Negative for rash.  Neurological: Negative.  Negative for dizziness and headaches.  All other systems reviewed and are negative.   Today's Vitals   10/15/22 0808  BP: 132/86  Pulse: 81  SpO2: 99%  Weight: 184 lb 6 oz (83.6 kg)  Height: 6' (1.829 m)   Body mass index is 25.01 kg/m.   Physical Exam Vitals reviewed.  Constitutional:      Appearance: Normal appearance.  HENT:     Head:  Normocephalic.  Eyes:     Extraocular Movements: Extraocular movements intact.     Pupils: Pupils are equal, round, and reactive to light.  Cardiovascular:     Rate and Rhythm: Normal rate and regular rhythm.     Pulses: Normal pulses.     Heart sounds: Normal heart sounds.  Pulmonary:     Effort: Pulmonary effort is normal.     Breath sounds: Normal breath sounds.  Abdominal:     Palpations: Abdomen is soft.     Tenderness: There is no abdominal tenderness.  Musculoskeletal:     Cervical back: No tenderness.  Lymphadenopathy:     Cervical: No cervical adenopathy.  Skin:    General: Skin is warm and dry.     Capillary Refill: Capillary refill takes less than 2 seconds.  Neurological:     General: No focal deficit present.     Mental Status: He is alert and oriented to person, place, and time.  Psychiatric:        Mood and Affect: Mood normal.        Behavior: Behavior normal.      ASSESSMENT & PLAN: A total of 45 minutes was spent with the patient and counseling/coordination of  care regarding preparing for this visit, review of most recent office visit notes, review of most recent endocrinologist office visit notes, review of multiple chronic medical conditions under management, review of most recent blood work results, review of all medications, education and nutrition, cardiovascular risks associated with diabetes and hypertension, review of health maintenance items and need for colon cancer screening with colonoscopy in the face of a positive Cologuard test 1 year ago, prognosis, documentation, and need for follow-up  Problem List Items Addressed This Visit       Cardiovascular and Mediastinum   Hypertension associated with diabetes (HCC) - Primary    BP Readings from Last 3 Encounters:  10/15/22 132/86  09/20/22 120/70  04/05/22 128/76  Well-controlled hypertension Continue amlodipine 5 mg and lisinopril 10 mg daily Well-controlled diabetes Continue insulin regimen  as per endocrinologist's recommendation Diet and nutrition discussed Cardiovascular risks associated with hypertension and diabetes discussed Blood work done today Follow-up in 6 months       Relevant Orders   Comprehensive metabolic panel   Lipid panel     Endocrine   Dyslipidemia associated with type 2 diabetes mellitus (HCC)    Chronic stable conditions Presently on atorvastatin 20 mg daily Lipid profile done today Diet and nutrition discussed The 10-year ASCVD risk score (Arnett DK, et al., 2019) is: 17.9%   Values used to calculate the score:     Age: 38 years     Sex: Male     Is Non-Hispanic African American: Yes     Diabetic: Yes     Tobacco smoker: No     Systolic Blood Pressure: 132 mmHg     Is BP treated: Yes     HDL Cholesterol: 75.4 mg/dL     Total Cholesterol: 172 mg/dL         Other   Status post below knee amputation of right lower extremity (HCC)   Other Visit Diagnoses     Need for vaccination       Relevant Orders   Flu vaccine trivalent PF, 6mos and older(Flulaval,Afluria,Fluarix,Fluzone)   Positive colorectal cancer screening using Cologuard test       Relevant Orders   Ambulatory referral to Gastroenterology   Screening for colon cancer       Relevant Orders   Ambulatory referral to Gastroenterology        Patient Instructions  Diabetes Mellitus and Nutrition, Adult When you have diabetes, or diabetes mellitus, it is very important to have healthy eating habits because your blood sugar (glucose) levels are greatly affected by what you eat and drink. Eating healthy foods in the right amounts, at about the same times every day, can help you: Manage your blood glucose. Lower your risk of heart disease. Improve your blood pressure. Reach or maintain a healthy weight. What can affect my meal plan? Every person with diabetes is different, and each person has different needs for a meal plan. Your health care provider may recommend that you  work with a dietitian to make a meal plan that is best for you. Your meal plan may vary depending on factors such as: The calories you need. The medicines you take. Your weight. Your blood glucose, blood pressure, and cholesterol levels. Your activity level. Other health conditions you have, such as heart or kidney disease. How do carbohydrates affect me? Carbohydrates, also called carbs, affect your blood glucose level more than any other type of food. Eating carbs raises the amount of glucose in your blood. It  is important to know how many carbs you can safely have in each meal. This is different for every person. Your dietitian can help you calculate how many carbs you should have at each meal and for each snack. How does alcohol affect me? Alcohol can cause a decrease in blood glucose (hypoglycemia), especially if you use insulin or take certain diabetes medicines by mouth. Hypoglycemia can be a life-threatening condition. Symptoms of hypoglycemia, such as sleepiness, dizziness, and confusion, are similar to symptoms of having too much alcohol. Do not drink alcohol if: Your health care provider tells you not to drink. You are pregnant, may be pregnant, or are planning to become pregnant. If you drink alcohol: Limit how much you have to: 0-1 drink a day for women. 0-2 drinks a day for men. Know how much alcohol is in your drink. In the [place] one drink equals one 12 oz bottle of beer (355 mL), one 5 oz glass of wine (148 mL), or one 1 oz glass of hard liquor (44 mL). Keep yourself hydrated with water, diet soda, or unsweetened iced tea. Keep in mind that regular soda, juice, and other mixers may contain a lot of sugar and must be counted as carbs. What are tips for following this plan?  Reading food labels Start by checking the serving size on the Nutrition Facts label of packaged foods and drinks. The number of calories and the amount of carbs, fats, and other nutrients listed on the  label are based on one serving of the item. Many items contain more than one serving per package. Check the total grams (g) of carbs in one serving. Check the number of grams of saturated fats and trans fats in one serving. Choose foods that have a low amount or none of these fats. Check the number of milligrams (mg) of salt (sodium) in one serving. Most people should limit total sodium intake to less than 2,300 mg per day. Always check the nutrition information of foods labeled as "low-fat" or "nonfat." These foods may be higher in added sugar or refined carbs and should be avoided. Talk to your dietitian to identify your daily goals for nutrients listed on the label. Shopping Avoid buying canned, pre-made, or processed foods. These foods tend to be high in fat, sodium, and added sugar. Shop around the outside edge of the grocery store. This is where you will most often find fresh fruits and vegetables, bulk grains, fresh meats, and fresh dairy products. Cooking Use low-heat cooking methods, such as baking, instead of high-heat cooking methods, such as deep frying. Cook using healthy oils, such as olive, canola, or sunflower oil. Avoid cooking with butter, cream, or high-fat meats. Meal planning Eat meals and snacks regularly, preferably at the same times every day. Avoid going long periods of time without eating. Eat foods that are high in fiber, such as fresh fruits, vegetables, beans, and whole grains. Eat 4-6 oz (112-168 g) of lean protein each day, such as lean meat, chicken, fish, eggs, or tofu. One ounce (oz) (28 g) of lean protein is equal to: 1 oz (28 g) of meat, chicken, or fish. 1 egg.  cup (62 g) of tofu. Eat some foods each day that contain healthy fats, such as avocado, nuts, seeds, and fish. What foods should I eat? Fruits Berries. Apples. Oranges. Peaches. Apricots. Plums. Grapes. Mangoes. Papayas. Pomegranates. Kiwi. Cherries. Vegetables Leafy greens, including lettuce,  spinach, kale, chard, collard greens, mustard greens, and cabbage. Beets. Cauliflower. Broccoli. Carrots. [place]  beans. Tomatoes. Peppers. Onions. Cucumbers. Brussels sprouts. Grains Whole grains, such as whole-wheat or whole-grain bread, crackers, tortillas, cereal, and pasta. Unsweetened oatmeal. Quinoa. Brown or wild rice. Meats and other proteins Seafood. Poultry without skin. Lean cuts of poultry and beef. Tofu. Nuts. Seeds. Dairy Low-fat or fat-free dairy products such as milk, yogurt, and cheese. The items listed above may not be a complete list of foods and beverages you can eat and drink. Contact a dietitian for more information. What foods should I avoid? Fruits Fruits canned with syrup. Vegetables Canned vegetables. Frozen vegetables with butter or cream sauce. Grains Refined white flour and flour products such as bread, pasta, snack foods, and cereals. Avoid all processed foods. Meats and other proteins Fatty cuts of meat. Poultry with skin. Breaded or fried meats. Processed meat. Avoid saturated fats. Dairy Full-fat yogurt, cheese, or milk. Beverages Sweetened drinks, such as soda or iced tea. The items listed above may not be a complete list of foods and beverages you should avoid. Contact a dietitian for more information. Questions to ask a health care provider Do I need to meet with a certified diabetes care and education specialist? Do I need to meet with a dietitian? What number can I call if I have questions? When are the best times to check my blood glucose? Where to find more information: American Diabetes Association: diabetes.org Academy of Nutrition and Dietetics: eatright.[employer] of Diabetes and Digestive and Kidney Diseases: StageSync.si Association of Diabetes Care & Education Specialists: diabeteseducator.org Summary It is important to have healthy eating habits because your blood sugar (glucose) levels are greatly affected by what you eat  and drink. It is important to use alcohol carefully. A healthy meal plan will help you manage your blood glucose and lower your risk of heart disease. Your health care provider may recommend that you work with a dietitian to make a meal plan that is best for you. This information is not intended to replace advice given to you by your health care provider. Make sure you discuss any questions you have with your health care provider. Document Revised: 08/12/2019 Document Reviewed: 08/12/2019 Elsevier Patient Education  2024 Elsevier Inc.     Edwina Barth, MD [facility]

## 2022-11-30 ENCOUNTER — Other Ambulatory Visit: Payer: Self-pay | Admitting: Internal Medicine

## 2022-11-30 MED ORDER — INSULIN PEN NEEDLE 32G X 4 MM MISC: 3 refills | Status: AC

## 2022-12-14 ENCOUNTER — Telehealth: Payer: Self-pay

## 2022-12-14 MED ORDER — BASAGLAR KWIKPEN 100 UNIT/ML ~~LOC~~ SOPN: 8.0000 [IU] | PEN_INJECTOR | Freq: Every day | SUBCUTANEOUS | 1 refills | Status: DC

## 2022-12-14 NOTE — Telephone Encounter (Signed)
Patient called and states that his insurance is not covering Lantus and needed [place] to send in [place] for him.   Rx has been sent.   Requested Prescriptions   Signed Prescriptions Disp Refills   Insulin Glargine (BASAGLAR KWIKPEN) 100 UNIT/ML 15 mL 1    Sig: Inject 8-10 Units into the skin daily.    Authorizing Provider: Carlus Pavlov    Ordering User: Pollie Meyer

## 2023-01-21 ENCOUNTER — Ambulatory Visit: Payer: 59 | Admitting: Internal Medicine

## 2023-02-12 ENCOUNTER — Telehealth: Payer: Self-pay

## 2023-02-12 MED ORDER — NOVOLOG FLEXPEN 100 UNIT/ML ~~LOC~~ SOPN: 5.0000 [IU] | PEN_INJECTOR | Freq: Four times a day (QID) | SUBCUTANEOUS | 3 refills | Status: DC

## 2023-02-12 NOTE — Telephone Encounter (Signed)
Pt and pharmacy called requesting a refill of Novolog due to insurance coverage.   Requested Prescriptions   Signed Prescriptions Disp Refills   insulin aspart (NOVOLOG FLEXPEN) 100 UNIT/ML FlexPen 30 mL 3    Sig: Inject 5-8 Units into the skin in the morning, at noon, in the evening, and at bedtime.    Authorizing Provider: Carlus Pavlov    Ordering User: Pollie Meyer

## 2023-02-15 ENCOUNTER — Other Ambulatory Visit: Payer: Self-pay

## 2023-03-07 ENCOUNTER — Ambulatory Visit: Payer: 59 | Admitting: Internal Medicine

## 2023-03-14 ENCOUNTER — Ambulatory Visit: Payer: 59 | Admitting: Internal Medicine

## 2023-03-24 ENCOUNTER — Other Ambulatory Visit: Payer: Self-pay | Admitting: Emergency Medicine

## 2023-03-24 DIAGNOSIS — I10 Essential (primary) hypertension: Secondary | ICD-10-CM

## 2023-03-29 ENCOUNTER — Other Ambulatory Visit: Payer: Self-pay | Admitting: Internal Medicine

## 2023-04-22 ENCOUNTER — Telehealth: Payer: Self-pay

## 2023-04-22 MED ORDER — BASAGLAR KWIKPEN 100 UNIT/ML ~~LOC~~ SOPN: 8.0000 [IU] | PEN_INJECTOR | Freq: Every day | SUBCUTANEOUS | 1 refills | Status: DC

## 2023-04-22 NOTE — Telephone Encounter (Signed)
 Requested Prescriptions   Signed Prescriptions Disp Refills   Insulin Glargine (BASAGLAR KWIKPEN) 100 UNIT/ML 15 mL 1    Sig: Inject 8-10 Units into the skin daily.    Authorizing Provider: Carlus Pavlov    Ordering User: Pollie Meyer

## 2023-04-25 ENCOUNTER — Ambulatory Visit: Payer: 59 | Admitting: Internal Medicine

## 2023-05-14 ENCOUNTER — Ambulatory Visit: Admitting: Internal Medicine

## 2023-05-14 NOTE — Progress Notes (Deleted)
 Patient ID: Gordon Walker, male   DOB: [birth year], 58 y.o.   MRN: [id number]  HPI: Gordon Walker is a 58 y.o.-year-old male, returning for follow-up for DM1, dx in 1989, insulin -dependent since dx, uncontrolled, with complications (PAD, CKD, DR, bilateral leg amputations, PN). Pt. previously saw Dr. Washington Hacker, but last visit with me 8 months ago.  Interim history: No increased urination, blurry vision, nausea, chest pain.  Reviewed HbA1c: Lab Results  Component Value Date   HGBA1C 7.1 (A) 09/20/2022   HGBA1C 6.7 (A) 04/05/2022   HGBA1C 8.0 (A) 09/19/2021   HGBA1C 7.4 (A) 05/25/2021   HGBA1C 9.1 (A) 03/16/2021   HGBA1C 7.4 (A) 10/11/2020   HGBA1C 7.2 (A) 06/06/2020   HGBA1C 6.6 (A) 12/08/2019   HGBA1C 7.2 (A) 09/07/2019   HGBA1C 5.7 (A) 06/05/2019   He was previously on: - Lantus  4-6 units at midnight - Humalog  (16)-(16)-(8-12) units 3x a day usually after he starts eating Review of Dr. Jacqualyn Mates note, he refused an insulin  pump in the past.  I advised him to change to: - Lantus  6-8 >> 8 >> 6-8 units at midnight (work) - Humalog  8-16 >> 8 units before meals, 4-5 units before a snack >> 15 min before you start eating 4-6 units before b'fast 6-8 units before lunch 10-12 units before dinner 3-4 units before a snack 1-2 units for correction  Pt checks his sugars >4x a day with his Dexcom CGM and they are:  Previously:  Previously:   Lowest sugar was 50s >> 40s; he has hypoglycemia awareness at 70.  Highest sugar was >350 >> 400s >> 400s.  Glucometer: Relion  Pt's meals are: - Breakfast (5 pm): fast food (on the way to work) or sandwich -  - Lunch: take out or home cooked - Dinner: take out or home cooked - Snacks: at night  - + CKD, last BUN/creatinine:  Lab Results  Component Value Date   BUN 17 10/15/2022   BUN 20 06/22/2021   CREATININE 1.17 10/15/2022   CREATININE 1.18 06/22/2021   Lab Results  Component Value Date   MICRALBCREAT 2.1 09/20/2022  He  is on lisinopril  10 mg daily.  - + HL; last set of lipids: Lab Results  Component Value Date   CHOL 123 10/15/2022   HDL 68.10 10/15/2022   LDLCALC 44 10/15/2022   TRIG 54.0 10/15/2022   CHOLHDL 2 10/15/2022  He is on Lipitor 20 mg daily  - last eye exam was in 11/2021: + DR (severe), + glaucoma. He had laser Sx 2015, 2016.  - Last foot exam 10/11/2020. He had R BKA and L transmetatarsal amputation.  No further foot exam needed.  He also has a history of HTN.  No results found for: "TSH"  He works 6 PM to 6 AM 2-3 times a week.  ROS: + see HPI  Past Medical History:  Diagnosis Date   Complication of anesthesia    anesthesia complication 01/18/16; pt unaware what happened    Dehiscence of amputation stump (HCC)    right   Diabetes mellitus without complication (HCC)    Type 1   Heart murmur    "years ago"   Hypertension    "years ago"   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, with long-term current use of insulin  [facility])    Past Surgical History:  Procedure Laterality Date   AMPUTATION Left 01/07/2015   Procedure: Left Foot 1st Ray Amputation;  Surgeon: Timothy Ford, MD;  Location: [facility] OR;  Service: Orthopedics;  Laterality: Left;   AMPUTATION Left 07/15/2015   Procedure: Left Transmetatarsal Amputation;  Surgeon: Timothy Ford, MD;  Location: [facility] OR;  Service: Orthopedics;  Laterality: Left;   AMPUTATION Right 12/30/2015   Procedure: 5th Ray Amputation Right Foot;  Surgeon: Timothy Ford, MD;  Location[facility];  Service: Orthopedics;  Laterality: Right;   AMPUTATION Right 01/15/2016   Procedure: GUILLOTINE AMPUTATION  ABOVE THE ANKLE AMPUTATION;  Surgeon: Adah Acron, MD;  Location: [facility] OR;  Service: Orthopedics;  Laterality: Right;   AMPUTATION Right 01/18/2016   Procedure: AMPUTATION BELOW KNEE;  Surgeon: Adah Acron, MD;  Location: [facility] OR;  Service: Orthopedics;  Laterality: Right;   STUMP REVISION Right 03/21/2016   Procedure: Right  Below Knee Amputation Stump Revision, WOUND VAC application;  Surgeon: Adah Acron, MD;  Location: [facility] OR;  Service: Orthopedics;  Laterality: Right;   Social History   Socioeconomic History   Marital status: Married    Spouse name: Not on file   Number of children: Not on file   Years of education: Not on file   Highest education level: Master's degree (e.g., MA, MS, MEng, MEd, MSW, MBA)  Occupational History   Not on file  Tobacco Use   Smoking status: Never   Smokeless tobacco: Never  Substance and Sexual Activity   Alcohol use: No   Drug use: No   Sexual activity: Not on file  Other Topics Concern   Not on file  Social History Narrative   Not on file   Social Drivers of Health   Financial Resource Strain: Low Risk  (10/15/2022)   Overall Financial Resource Strain (CARDIA)    Difficulty of Paying Living Expenses: Not very hard  Food Insecurity: No Food Insecurity (10/15/2022)   Hunger Vital Sign    Worried About Running Out of Food in the Last Year: Never true    Ran Out of Food in the Last Year: Never true  Transportation Needs: No Transportation Needs (10/15/2022)   PRAPARE - Administrator, Civil Service (Medical): No    Lack of Transportation (Non-Medical): No  Physical Activity: Insufficiently Active (10/15/2022)   Exercise Vital Sign    Days of Exercise per Week: 2 days    Minutes of Exercise per Session: 30 min  Stress: No Stress Concern Present (10/15/2022)   [employer] of Occupational Health - Occupational Stress Questionnaire    Feeling of Stress : Only a little  Social Connections: Unknown (10/15/2022)   Social Connection and Isolation Panel [NHANES]    Frequency of Communication with Friends and Family: Three times a week    Frequency of Social Gatherings with Friends and Family: Twice a week    Attends Religious Services: Patient declined    Database administrator or Organizations: No    Attends Engineer, structural: Not on file     Marital Status: Married  Catering manager Violence: Not on file   Current Outpatient Medications on File Prior to Visit  Medication Sig Dispense Refill   amLODipine  (NORVASC ) 5 MG tablet Take 1 tablet by mouth once daily 90 tablet 3   aspirin  EC 81 MG tablet Take 81 mg by mouth daily.     atorvastatin  (LIPITOR) 20 MG tablet Take 1 tablet by mouth once daily 90 tablet 3   Continuous Glucose Sensor (DEXCOM G7 SENSOR) MISC APPLY 1 SENSOR TO CHECK BLOOD GLUCOSE EVERY 10 DAYS 9 each 3  Glucagon  3 MG/DOSE POWD Place 3 mg into the nose once as needed for up to 1 dose. 1 each 11   insulin  aspart (FIASP  FLEXTOUCH) 100 UNIT/ML FlexTouch Pen Inject up to 30 units a day, as advised.  Please inject right before each meal. 30 mL 3   insulin  aspart (NOVOLOG  FLEXPEN) 100 UNIT/ML FlexPen Inject 5-8 Units into the skin in the morning, at noon, in the evening, and at bedtime. 30 mL 3   Insulin  Glargine (BASAGLAR  KWIKPEN) 100 UNIT/ML Inject 8-10 Units into the skin daily. 15 mL 1   insulin  lispro (HUMALOG  KWIKPEN) 100 UNIT/ML KwikPen Inject up to 30 units under skin in a day as advised 30 mL 3   Insulin  Pen Needle 32G X 4 MM MISC Use 4x a day - Techlite pen needles 400 each 3   lisinopril  (ZESTRIL ) 10 MG tablet Take 1 tablet by mouth once daily 90 tablet 0   Multiple Vitamin (MULTIVITAMIN WITH MINERALS) TABS tablet Take 1 tablet by mouth daily.     nitroGLYCERIN  (NITRODUR - DOSED IN MG/24 HR) 0.2 mg/hr patch APPLY 1 PATCH TOPICALLY ONCE DAILY 30 patch 0   pantoprazole  (PROTONIX ) 40 MG tablet Take 1 tablet (40 mg total) by mouth daily. (Patient not taking: Reported on 10/15/2022) 30 tablet 3   SSD 1 % cream APPLY  CREAM EXTERNALLY TO AFFECTED AREA ONCE DAILY 50 g 0   No current facility-administered medications on file prior to visit.   No Known Allergies Family History  Problem Relation Age of Onset   Hypertension Mother    Diabetes Mother    Cancer Mother    Prostate cancer Father    PE: There were  no vitals taken for this visit. Wt Readings from Last 3 Encounters:  10/15/22 184 lb 6 oz (83.6 kg)  09/20/22 184 lb (83.5 kg)  04/05/22 167 lb 8 oz (76 kg)   Constitutional: normal weight, in NAD Eyes: no exophthalmos ENT: + L>R thyromegaly, no cervical lymphadenopathy Cardiovascular: RRR, No MRG Respiratory: CTA B Musculoskeletal: + B leg amputations: R AKA, L transmetatarsal amputation Skin: no rashes Neurological: no tremor with outstretched hands  ASSESSMENT: 1. DM1, uncontrolled, with complications - PAD - s/p leg amputations - R BKA 2017, L TMT amputations 2016 - PN - CKD - DR  2. HL  3.  Thyromegaly  PLAN:  1. Patient with longstanding uncontrolled, type 1 diabetes on basal/bolus insulin  regimen, with improved control at the last visits, and an HbA1c of 7.1% in 08/2022.  At that time, we adjusted his Humalog  insulin  doses as the sugars were quite fluctuating, with more lows in the 2 weeks prior to the last visit, mostly in the second half of the night and also between approximately 10 AM and 7 PM.  At that time, we again discussed about trying an insulin  pump but he did not want to try this.  I also previously discussed with him about a referral to the nutritionist/diabetes educator to go over the diet and also for more advice about insulin  adjustments but he did not have this appointment.  I referred him again at last visit, but he again did not go for an appointment. CGM interpretation: -At today's visit, we reviewed his CGM downloads: It appears that *** of values are in target range (goal >70%), while *** are higher than 180 (goal <25%), and *** are lower than 70 (goal <4%).  The calculated average blood sugar is ***.  The projected HbA1c for the next  3 months (GMI) is ***. -Reviewing the CGM trends, ***  - I suggested to:  Patient Instructions  Please use: - Lantus  8 units at midnight - Humalog  - 15 min before you start eating 4-6 units before b'fast 6-8 units  before lunch 10-12 units before dinner 3-4 units before a snack 1-2 units for correction  Please schedule an appt with Cydne Doyne  to go over the sugar patterns and insulin  doses, as well as the diet. [phone].  Please return in 3-4 months.  - we checked his HbA1c: 7%  - advised to check sugars at different times of the day - 4x a day, rotating check times - advised for yearly eye exams >> he is UTD - return to clinic in 3-4 months  2. HL - Latest lipid panel reviewed from 09/2022: All fractions at goal: Lab Results  Component Value Date   CHOL 123 10/15/2022   HDL 68.10 10/15/2022   LDLCALC 44 10/15/2022   TRIG 54.0 10/15/2022   CHOLHDL 2 10/15/2022  - He is on Lipitor 20 mg daily.  3. Thyromegaly -This was detected on palpation at last visit: Left thyroid lobe was more prominent than the right. - At last visit, I ordered another thyroid ultrasound but he still did not have this yet   Emilie Harden, MD PhD [facility] Endocrinology

## 2023-05-16 ENCOUNTER — Ambulatory Visit: Admitting: Internal Medicine

## 2023-05-16 NOTE — Progress Notes (Deleted)
 Patient ID: Gordon Walker, male   DOB: January 13, 1966, 58 y.o.   MRN: [id number] This note was precharted 05/14/2023.  HPI: Gordon Walker is a 58 y.o.-year-old male, returning for follow-up for DM1, dx in 1989, insulin -dependent since dx, uncontrolled, with complications (PAD, CKD, DR, bilateral leg amputations, PN). Pt. previously saw Dr. Washington Hacker, but last visit with me 8 months ago.  Interim history: No increased urination, blurry vision, nausea, chest pain.  Reviewed HbA1c: Lab Results  Component Value Date   HGBA1C 7.1 (A) 09/20/2022   HGBA1C 6.7 (A) 04/05/2022   HGBA1C 8.0 (A) 09/19/2021   HGBA1C 7.4 (A) 05/25/2021   HGBA1C 9.1 (A) 03/16/2021   HGBA1C 7.4 (A) 10/11/2020   HGBA1C 7.2 (A) 06/06/2020   HGBA1C 6.6 (A) 12/08/2019   HGBA1C 7.2 (A) 09/07/2019   HGBA1C 5.7 (A) 06/05/2019   He was previously on: - Lantus  4-6 units at midnight - Humalog  (16)-(16)-(8-12) units 3x a day usually after he starts eating Review of Dr. Jacqualyn Mates note, he refused an insulin  pump in the past.  I advised him to change to: - Lantus  6-8 >> 8 >> 6-8 units at midnight (work) - Humalog  8-16 >> 8 units before meals, 4-5 units before a snack >> 15 min before you start eating 4-6 units before b'fast 6-8 units before lunch 10-12 units before dinner 3-4 units before a snack 1-2 units for correction  Pt checks his sugars >4x a day with his Dexcom CGM and they are:  Previously:  Previously:   Lowest sugar was 50s >> 40s; he has hypoglycemia awareness at 70.  Highest sugar was >350 >> 400s >> 400s.  Glucometer: Relion  Pt's meals are: - Breakfast (5 pm): fast food (on the way to work) or sandwich -  - Lunch: take out or home cooked - Dinner: take out or home cooked - Snacks: at night  - + CKD, last BUN/creatinine:  Lab Results  Component Value Date   BUN 17 10/15/2022   BUN 20 06/22/2021   CREATININE 1.17 10/15/2022   CREATININE 1.18 06/22/2021   Lab Results  Component Value Date    MICRALBCREAT 2.1 09/20/2022  He is on lisinopril  10 mg daily.  - + HL; last set of lipids: Lab Results  Component Value Date   CHOL 123 10/15/2022   HDL 68.10 10/15/2022   LDLCALC 44 10/15/2022   TRIG 54.0 10/15/2022   CHOLHDL 2 10/15/2022  He is on Lipitor 20 mg daily  - last eye exam was in 11/2021: + DR (severe), + glaucoma. He had laser Sx 2015, 2016.  - Last foot exam 10/11/2020. He had R BKA and L transmetatarsal amputation.  No further foot exam needed.  He also has a history of HTN.  No results found for: "TSH"  He works 6 PM to 6 AM 2-3 times a week.  ROS: + see HPI  Past Medical History:  Diagnosis Date   Complication of anesthesia    anesthesia complication 01/18/16; pt unaware what happened    Dehiscence of amputation stump (HCC)    right   Diabetes mellitus without complication (HCC)    Type 1   Heart murmur    "years ago"   Hypertension    "years ago"   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, with long-term current use of insulin  [facility])    Past Surgical History:  Procedure Laterality Date   AMPUTATION Left 01/07/2015   Procedure: Left Foot 1st Ray Amputation;  Surgeon:  Timothy Ford, MD;  Location: [facility] OR;  Service: Orthopedics;  Laterality: Left;   AMPUTATION Left 07/15/2015   Procedure: Left Transmetatarsal Amputation;  Surgeon: Timothy Ford, MD;  Location: [facility] OR;  Service: Orthopedics;  Laterality: Left;   AMPUTATION Right 12/30/2015   Procedure: 5th Ray Amputation Right Foot;  Surgeon: Timothy Ford, MD;  Location: [facility] OR;  Service: Orthopedics;  Laterality: Right;   AMPUTATION Right 01/15/2016   Procedure: GUILLOTINE AMPUTATION  ABOVE THE ANKLE AMPUTATION;  Surgeon: Adah Acron, MD;  Location: [facility] OR;  Service: Orthopedics;  Laterality: Right;   AMPUTATION Right 01/18/2016   Procedure: AMPUTATION BELOW KNEE;  Surgeon: Adah Acron, MD;  Location: [facility] OR;  Service: Orthopedics;  Laterality: Right;   STUMP REVISION  Right 03/21/2016   Procedure: Right Below Knee Amputation Stump Revision, WOUND VAC application;  Surgeon: Adah Acron, MD;  Location: [facility] OR;  Service: Orthopedics;  Laterality: Right;   Social History   Socioeconomic History   Marital status: Married    Spouse name: Not on file   Number of children: Not on file   Years of education: Not on file   Highest education level: Master's degree (e.g., MA, MS, MEng, MEd, MSW, MBA)  Occupational History   Not on file  Tobacco Use   Smoking status: Never   Smokeless tobacco: Never  Substance and Sexual Activity   Alcohol use: No   Drug use: No   Sexual activity: Not on file  Other Topics Concern   Not on file  Social History Narrative   Not on file   Social Drivers of Health   Financial Resource Strain: Low Risk  (10/15/2022)   Overall Financial Resource Strain (CARDIA)    Difficulty of Paying Living Expenses: Not very hard  Food Insecurity: No Food Insecurity (10/15/2022)   Hunger Vital Sign    Worried About Running Out of Food in the Last Year: Never true    Ran Out of Food in the Last Year: Never true  Transportation Needs: No Transportation Needs (10/15/2022)   PRAPARE - Administrator, Civil Service (Medical): No    Lack of Transportation (Non-Medical): No  Physical Activity: Insufficiently Active (10/15/2022)   Exercise Vital Sign    Days of Exercise per Week: 2 days    Minutes of Exercise per Session: 30 min  Stress: No Stress Concern Present (10/15/2022)   [employer] of Occupational Health - Occupational Stress Questionnaire    Feeling of Stress : Only a little  Social Connections: Unknown (10/15/2022)   Social Connection and Isolation Panel [NHANES]    Frequency of Communication with Friends and Family: Three times a week    Frequency of Social Gatherings with Friends and Family: Twice a week    Attends Religious Services: Patient declined    Database administrator or Organizations: No    Attends Museum/gallery exhibitions officer: Not on file    Marital Status: Married  Catering manager Violence: Not on file   Current Outpatient Medications on File Prior to Visit  Medication Sig Dispense Refill   amLODipine  (NORVASC ) 5 MG tablet Take 1 tablet by mouth once daily 90 tablet 3   aspirin  EC 81 MG tablet Take 81 mg by mouth daily.     atorvastatin  (LIPITOR) 20 MG tablet Take 1 tablet by mouth once daily 90 tablet 3   Continuous Glucose Sensor (DEXCOM G7 SENSOR) MISC APPLY 1 SENSOR TO CHECK BLOOD GLUCOSE EVERY  10 DAYS 9 each 3   Glucagon  3 MG/DOSE POWD Place 3 mg into the nose once as needed for up to 1 dose. 1 each 11   insulin  aspart (FIASP  FLEXTOUCH) 100 UNIT/ML FlexTouch Pen Inject up to 30 units a day, as advised.  Please inject right before each meal. 30 mL 3   insulin  aspart (NOVOLOG  FLEXPEN) 100 UNIT/ML FlexPen Inject 5-8 Units into the skin in the morning, at noon, in the evening, and at bedtime. 30 mL 3   Insulin  Glargine (BASAGLAR  KWIKPEN) 100 UNIT/ML Inject 8-10 Units into the skin daily. 15 mL 1   insulin  lispro (HUMALOG  KWIKPEN) 100 UNIT/ML KwikPen Inject up to 30 units under skin in a day as advised 30 mL 3   Insulin  Pen Needle 32G X 4 MM MISC Use 4x a day - Techlite pen needles 400 each 3   lisinopril  (ZESTRIL ) 10 MG tablet Take 1 tablet by mouth once daily 90 tablet 0   Multiple Vitamin (MULTIVITAMIN WITH MINERALS) TABS tablet Take 1 tablet by mouth daily.     nitroGLYCERIN  (NITRODUR - DOSED IN MG/24 HR) 0.2 mg/hr patch APPLY 1 PATCH TOPICALLY ONCE DAILY 30 patch 0   pantoprazole  (PROTONIX ) 40 MG tablet Take 1 tablet (40 mg total) by mouth daily. (Patient not taking: Reported on 10/15/2022) 30 tablet 3   SSD 1 % cream APPLY  CREAM EXTERNALLY TO AFFECTED AREA ONCE DAILY 50 g 0   No current facility-administered medications on file prior to visit.   No Known Allergies Family History  Problem Relation Age of Onset   Hypertension Mother    Diabetes Mother    Cancer Mother    Prostate  cancer Father    PE: There were no vitals taken for this visit. Wt Readings from Last 3 Encounters:  10/15/22 184 lb 6 oz (83.6 kg)  09/20/22 184 lb (83.5 kg)  04/05/22 167 lb 8 oz (76 kg)   Constitutional: normal weight, in NAD Eyes: no exophthalmos ENT: + L>R thyromegaly, no cervical lymphadenopathy Cardiovascular: RRR, No MRG Respiratory: CTA B Musculoskeletal: + B leg amputations: R AKA, L transmetatarsal amputation Skin: no rashes Neurological: no tremor with outstretched hands  ASSESSMENT: 1. DM1, uncontrolled, with complications - PAD - s/p leg amputations - R BKA 2017, L TMT amputations 2016 - PN - CKD - DR  2. HL  3.  Thyromegaly  PLAN:  1. Patient with longstanding uncontrolled, type 1 diabetes on basal/bolus insulin  regimen, with improved control at the last visits, and an HbA1c of 7.1% in 08/2022.  At that time, we adjusted his Humalog  insulin  doses as the sugars were quite fluctuating, with more lows in the 2 weeks prior to the last visit, mostly in the second half of the night and also between approximately 10 AM and 7 PM.  At that time, we again discussed about trying an insulin  pump but he did not want to try this.  I also previously discussed with him about a referral to the nutritionist/diabetes educator to go over the diet and also for more advice about insulin  adjustments but he did not have this appointment.  I referred him again at last visit, but he again did not go for an appointment. CGM interpretation: -At today's visit, we reviewed his CGM downloads: It appears that *** of values are in target range (goal >70%), while *** are higher than 180 (goal <25%), and *** are lower than 70 (goal <4%).  The calculated average blood sugar is ***.  The projected HbA1c for the next 3 months (GMI) is ***. -Reviewing the CGM trends, ***  - I suggested to:  Patient Instructions  Please use: - Lantus  8 units at midnight - Humalog  - 15 min before you start eating 4-6  units before b'fast 6-8 units before lunch 10-12 units before dinner 3-4 units before a snack 1-2 units for correction  Please schedule an appt with Cydne Doyne  to go over the sugar patterns and insulin  doses, as well as the diet. [phone].  Please return in 3-4 months.  - we checked his HbA1c: 7%  - advised to check sugars at different times of the day - 4x a day, rotating check times - advised for yearly eye exams >> he is UTD - return to clinic in 3-4 months  2. HL - Latest lipid panel reviewed from 09/2022: All fractions at goal: Lab Results  Component Value Date   CHOL 123 10/15/2022   HDL 68.10 10/15/2022   LDLCALC 44 10/15/2022   TRIG 54.0 10/15/2022   CHOLHDL 2 10/15/2022  - He is on Lipitor 20 mg daily.  3. Thyromegaly -This was detected on palpation at last visit: Left thyroid lobe was more prominent than the right. - At last visit, I ordered another thyroid ultrasound but he still did not have this yet   Emilie Harden, MD PhD [facility] Endocrinology

## 2023-06-21 ENCOUNTER — Other Ambulatory Visit: Payer: Self-pay | Admitting: Emergency Medicine

## 2023-06-21 DIAGNOSIS — I10 Essential (primary) hypertension: Secondary | ICD-10-CM

## 2023-08-08 ENCOUNTER — Encounter: Payer: Self-pay | Admitting: Internal Medicine

## 2023-08-08 ENCOUNTER — Ambulatory Visit (INDEPENDENT_AMBULATORY_CARE_PROVIDER_SITE_OTHER): Admitting: Internal Medicine

## 2023-08-08 VITALS — BP 138/80 | HR 77 | Ht 72.0 in | Wt 179.4 lb

## 2023-08-08 DIAGNOSIS — E010 Iodine-deficiency related diffuse (endemic) goiter: Secondary | ICD-10-CM

## 2023-08-08 DIAGNOSIS — E1052 Type 1 diabetes mellitus with diabetic peripheral angiopathy with gangrene: Secondary | ICD-10-CM | POA: Diagnosis not present

## 2023-08-08 DIAGNOSIS — E785 Hyperlipidemia, unspecified: Secondary | ICD-10-CM

## 2023-08-08 LAB — POCT GLYCOSYLATED HEMOGLOBIN (HGB A1C): Hemoglobin A1C: 8.9 % — AB (ref 4.0–5.6)

## 2023-08-08 MED ORDER — GLUCAGON 3 MG/DOSE NA POWD: 3.0000 mg | Freq: Once | NASAL | 11 refills | Status: AC | PRN

## 2023-08-08 MED ORDER — BASAGLAR KWIKPEN 100 UNIT/ML ~~LOC~~ SOPN: 12.0000 [IU] | PEN_INJECTOR | Freq: Every day | SUBCUTANEOUS | 3 refills | Status: DC

## 2023-08-08 MED ORDER — NOVOLOG FLEXPEN 100 UNIT/ML ~~LOC~~ SOPN: 5.0000 [IU] | PEN_INJECTOR | Freq: Four times a day (QID) | SUBCUTANEOUS | 3 refills | Status: DC

## 2023-08-08 NOTE — Progress Notes (Signed)
 Patient ID: Gordon Walker, male   DOB: 04/08/1965, 58 y.o.   MRN: [id number]  HPI: Gordon Walker is a 58 y.o.-year-old male, returning for follow-up for DM1, dx in 1989, insulin -dependent since dx, uncontrolled, with complications (PAD, CKD, DR, bilateral leg amputations, PN). Pt. previously saw Dr. Kassie, but last visit with me 11 months ago.  Interim history: No increased urination, blurry vision, nausea, chest pain. He works 3 pm to 2 am M-F, Sat 1 pm to 9 pm.  Reviewed HbA1c: Lab Results  Component Value Date   HGBA1C 7.1 (A) 09/20/2022   HGBA1C 6.7 (A) 04/05/2022   HGBA1C 8.0 (A) 09/19/2021   HGBA1C 7.4 (A) 05/25/2021   HGBA1C 9.1 (A) 03/16/2021   HGBA1C 7.4 (A) 10/11/2020   HGBA1C 7.2 (A) 06/06/2020   HGBA1C 6.6 (A) 12/08/2019   HGBA1C 7.2 (A) 09/07/2019   HGBA1C 5.7 (A) 06/05/2019   He was previously on: - Lantus  4-6 units at midnight - Humalog  (16)-(16)-(8-12) units 3x a day usually after he starts eating Review of Dr. Laymond note, he refused an insulin  pump in the past.  Currently on: - Lantus  6-8 >> 8 >> 6-8 units at midnight (work) -now Basaglar  - Humalog  8-16 >> 8 units before meals, 4-5 units before a snack >> 15 min before meals - now NovoLog  4-6 units before b'fast 6-8 units before lunch 10-12 units before dinner 3-4 units before a snack 1-2 units for correction  Pt checks his sugars >4x a day with his Dexcom CGM and they are:  Prev.:   Previously:  Lowest sugar was 50s >> 40s; he has hypoglycemia awareness at 70.  Highest sugar was  400s >> 400s.  Glucometer: Relion  Pt's meals are: - Breakfast (5 pm): fast food (on the way to work) or sandwich  - Lunch: take out or home cooked - Dinner: take out or home cooked - Snacks: at night  - + CKD, last BUN/creatinine:  Lab Results  Component Value Date   BUN 17 10/15/2022   BUN 20 06/22/2021   CREATININE 1.17 10/15/2022   CREATININE 1.18 06/22/2021  No results found for:  MICRALBCREAT He is on lisinopril  10 mg daily.  - + HL; last set of lipids: Lab Results  Component Value Date   CHOL 123 10/15/2022   HDL 68.10 10/15/2022   LDLCALC 44 10/15/2022   TRIG 54.0 10/15/2022   CHOLHDL 2 10/15/2022  He is on Lipitor 20 mg daily  - last eye exam was in 11/2021: + DR (severe), + glaucoma. He had laser Sx 2015, 2016.  - Last foot exam 10/11/2020. He had R BKA and L transmetatarsal amputation.   He also has a history of HTN.  No results found for: TSH  ROS: + see HPI  Past Medical History:  Diagnosis Date   Complication of anesthesia    anesthesia complication 01/18/16; pt unaware what happened    Dehiscence of amputation stump (HCC)    right   Diabetes mellitus without complication (HCC)    Type 1   Heart murmur    years ago   Hypertension    years ago   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, with long-term current use of insulin  [facility])    Past Surgical History:  Procedure Laterality Date   AMPUTATION Left 01/07/2015   Procedure: Left Foot 1st Ray Amputation;  Surgeon: Jerona Harden GAILS, MD;  Location: [facility] OR;  Service: Orthopedics;  Laterality: Left;   AMPUTATION Left 07/15/2015  Procedure: Left Transmetatarsal Amputation;  Surgeon: Jerona LULLA Sage, MD;  Location: [facility] OR;  Service: Orthopedics;  Laterality: Left;   AMPUTATION Right 12/30/2015   Procedure: 5th Ray Amputation Right Foot;  Surgeon: Jerona LULLA Sage, MD;  Location: [facility] OR;  Service: Orthopedics;  Laterality: Right;   AMPUTATION Right 01/15/2016   Procedure: GUILLOTINE AMPUTATION  ABOVE THE ANKLE AMPUTATION;  Surgeon: Oneil JAYSON Herald, MD;  Location: [facility] OR;  Service: Orthopedics;  Laterality: Right;   AMPUTATION Right 01/18/2016   Procedure: AMPUTATION BELOW KNEE;  Surgeon: Oneil JAYSON Herald, MD;  Location: [facility] OR;  Service: Orthopedics;  Laterality: Right;   STUMP REVISION Right 03/21/2016   Procedure: Right Below Knee Amputation Stump Revision, WOUND VAC  application;  Surgeon: Oneil JAYSON Herald, MD;  Location: [facility] OR;  Service: Orthopedics;  Laterality: Right;   Social History   Socioeconomic History   Marital status: Married    Spouse name: Not on file   Number of children: Not on file   Years of education: Not on file   Highest education level: Master's degree (e.g., MA, MS, MEng, MEd, MSW, MBA)  Occupational History   Not on file  Tobacco Use   Smoking status: Never   Smokeless tobacco: Never  Substance and Sexual Activity   Alcohol use: No   Drug use: No   Sexual activity: Not on file  Other Topics Concern   Not on file  Social History Narrative   Not on file   Social Drivers of Health   Financial Resource Strain: Low Risk  (10/15/2022)   Overall Financial Resource Strain (CARDIA)    Difficulty of Paying Living Expenses: Not very hard  Food Insecurity: No Food Insecurity (10/15/2022)   Hunger Vital Sign    Worried About Running Out of Food in the Last Year: Never true    Ran Out of Food in the Last Year: Never true  Transportation Needs: No Transportation Needs (10/15/2022)   PRAPARE - Administrator, Civil Service (Medical): No    Lack of Transportation (Non-Medical): No  Physical Activity: Insufficiently Active (10/15/2022)   Exercise Vital Sign    Days of Exercise per Week: 2 days    Minutes of Exercise per Session: 30 min  Stress: No Stress Concern Present (10/15/2022)   [employer] of Occupational Health - Occupational Stress Questionnaire    Feeling of Stress : Only a little  Social Connections: Unknown (10/15/2022)   Social Connection and Isolation Panel    Frequency of Communication with Friends and Family: Three times a week    Frequency of Social Gatherings with Friends and Family: Twice a week    Attends Religious Services: Patient declined    Database administrator or Organizations: No    Attends Engineer, structural: Not on file    Marital Status: Married  Catering manager Violence:  Not on file   Current Outpatient Medications on File Prior to Visit  Medication Sig Dispense Refill   amLODipine  (NORVASC ) 5 MG tablet Take 1 tablet by mouth once daily 90 tablet 3   aspirin  EC 81 MG tablet Take 81 mg by mouth daily.     atorvastatin  (LIPITOR) 20 MG tablet Take 1 tablet by mouth once daily 90 tablet 3   Continuous Glucose Sensor (DEXCOM G7 SENSOR) MISC APPLY 1 SENSOR TO CHECK BLOOD GLUCOSE EVERY 10 DAYS 9 each 3   Glucagon  3 MG/DOSE POWD Place 3 mg into the nose once as needed for up  to 1 dose. 1 each 11   insulin  aspart (FIASP  FLEXTOUCH) 100 UNIT/ML FlexTouch Pen Inject up to 30 units a day, as advised.  Please inject right before each meal. 30 mL 3   insulin  aspart (NOVOLOG  FLEXPEN) 100 UNIT/ML FlexPen Inject 5-8 Units into the skin in the morning, at noon, in the evening, and at bedtime. 30 mL 3   Insulin  Glargine (BASAGLAR  KWIKPEN) 100 UNIT/ML Inject 8-10 Units into the skin daily. 15 mL 1   insulin  lispro (HUMALOG  KWIKPEN) 100 UNIT/ML KwikPen Inject up to 30 units under skin in a day as advised 30 mL 3   Insulin  Pen Needle 32G X 4 MM MISC Use 4x a day - Techlite pen needles 400 each 3   lisinopril  (ZESTRIL ) 10 MG tablet Take 1 tablet by mouth once daily 90 tablet 0   Multiple Vitamin (MULTIVITAMIN WITH MINERALS) TABS tablet Take 1 tablet by mouth daily.     nitroGLYCERIN  (NITRODUR - DOSED IN MG/24 HR) 0.2 mg/hr patch APPLY 1 PATCH TOPICALLY ONCE DAILY 30 patch 0   pantoprazole  (PROTONIX ) 40 MG tablet Take 1 tablet (40 mg total) by mouth daily. (Patient not taking: Reported on 10/15/2022) 30 tablet 3   SSD 1 % cream APPLY  CREAM EXTERNALLY TO AFFECTED AREA ONCE DAILY 50 g 0   No current facility-administered medications on file prior to visit.   No Known Allergies Family History  Problem Relation Age of Onset   Hypertension Mother    Diabetes Mother    Cancer Mother    Prostate cancer Father    PE: There were no vitals taken for this visit. Wt Readings from Last 3  Encounters:  10/15/22 184 lb 6 oz (83.6 kg)  09/20/22 184 lb (83.5 kg)  04/05/22 167 lb 8 oz (76 kg)   Constitutional: normal weight, in NAD Eyes: no exophthalmos ENT: + L>R thyromegaly, no cervical lymphadenopathy Cardiovascular: RRR, No MRG Respiratory: CTA B Musculoskeletal: + B leg amputations: R AKA, L transmetatarsal amputation Skin: no rashes Neurological: no tremor with outstretched hands  ASSESSMENT: 1. DM1, uncontrolled, with complications - PAD - s/p leg amputations - R BKA 2017, L TMT amputations 2016 - PN - CKD - DR  2. HL  3.  Thyromegaly  PLAN:  1. Patient with longstanding, uncontrolled, type 1 diabetes, on basal-bolus insulin  regimen, returning after a long absence of 11 months.  At last visit, HbA1c was higher, at 7.1, increased from 6.7%. -At last visit, sugars were very fluctuating, with more lows in the 2 weeks prior to our appointment compared to the previous 2 weeks, mostly in the second half of the night and also for the majority of the day.  Upon questioning, he was using a higher dose of Humalog  for correction, so we discussed about reducing this.  We also reduced his dose of Humalog  before breakfast with less carbs, and increasing the Humalog  before lunch since sugars could be low after this meal.  I did recommend to increase the dose before dinner as sugars are high after this meal prompting him to go and correct in the middle of the night.  We did not change the Lantus  dose.  We again discussed about possibly trying again an insulin  pump but he declined.  I also recommended a referral to nutrition/diabetes education but he did not go. CGM interpretation: -At today's visit, we reviewed his CGM downloads: It appears that 50% of values are in target range (goal >70%), while 47% are higher than  180 (goal <25%), and 3% are lower than 70 (goal <4%).  The calculated average blood sugar is 183.  The projected HbA1c for the next 3 months (GMI) is 7.7%. -Reviewing  the CGM trends, sugars are higher but he also does not have as many and as severe leows compared to last OV. Sugars are increasing during the day, almost entirely elevated after lunch and also, with most sugars being above target after dinner. He still has some lows midday and also early morning.  He mentions that he is working slightly different hours than before, but still working a late shift.  He is planning to change this in the next few months. -For now, we discussed about possibly increasing the Lantus  dose since sugars are elevated, but I also advised him to move it midday, to avoid further drops in blood sugars overnight.  Also, he appears to need more insulin  before lunch but for now, we will continue the rest of the regimen. - I suggested to:  Patient Instructions  Please increase: - Basaglar  12 units at waking up - Novolog  - 15 min before you start eating 4-6 units before b'fast 10-12 units before lunch 10-12 units before dinner 3-4 units before a snack 1-2 units for correction   Please return in 3-4 months.  - we checked his HbA1c: 8.9% (higher) - advised to check sugars at different times of the day - 4x a day, rotating check times - advised for yearly eye exams >> he is due - return to clinic in 3-4 months  2. HL - Latest lipid panel showed fractions at goal: Lab Results  Component Value Date   CHOL 123 10/15/2022   HDL 68.10 10/15/2022   LDLCALC 44 10/15/2022   TRIG 54.0 10/15/2022   CHOLHDL 2 10/15/2022  - He is on Lipitor 20 mg daily.  No side effects. - will repeat this today  3. Thyromegaly -This was detected on palpation in 02/2022: Left thyroid lobe was more prominent than the right. - At last visit, I ordered another ultrasound but he still did not have this checked as he mentions that he did not have time.  I asked him whether he would be amenable to have this done now, but he declines.  Will continue to keep an eye on his thyroid clinically.  Orders Placed  This Encounter  Procedures   Microalbumin / creatinine urine ratio   Lipid Panel w/reflex Direct LDL   Comprehensive metabolic panel with GFR   TSH   POCT glycosylated hemoglobin (Hb A1C)    Lela Fendt, MD PhD [facility] Endocrinology

## 2023-08-08 NOTE — Patient Instructions (Addendum)
 Please increase: - Basaglar  12 units at waking up - Novolog  - 15 min before you start eating 4-6 units before b'fast 10-12 units before lunch 10-12 units before dinner 3-4 units before a snack 1-2 units for correction   Please return in 3-4 months.

## 2023-08-09 ENCOUNTER — Ambulatory Visit: Payer: Self-pay | Admitting: Internal Medicine

## 2023-08-09 LAB — LIPID PANEL W/REFLEX DIRECT LDL
Cholesterol: 132 mg/dL (ref <200)
HDL: 73 mg/dL (ref >=40)
LDL Cholesterol (Calc): 38 mg/dL
Non-HDL Cholesterol (Calc): 59 mg/dL (ref <130)
Total CHOL/HDL Ratio: 1.8 (calc) (ref <5.0)
Triglycerides: 124 mg/dL (ref <150)

## 2023-08-09 LAB — MICROALBUMIN / CREATININE URINE RATIO
Creatinine, Urine: 152 mg/dL (ref 20–320)
Microalb Creat Ratio: 33 mg/g{creat} — ABNORMAL HIGH (ref <30)
Microalb, Ur: 5 mg/dL

## 2023-08-09 LAB — COMPREHENSIVE METABOLIC PANEL WITH GFR
AG Ratio: 1.3 (calc) (ref 1.0–2.5)
ALT: 27 U/L (ref 9–46)
AST: 28 U/L (ref 10–35)
Albumin: 3.9 g/dL (ref 3.6–5.1)
Alkaline phosphatase (APISO): 114 U/L (ref 35–144)
BUN: 17 mg/dL (ref 7–25)
CO2: 31 mmol/L (ref 20–32)
Calcium: 9.4 mg/dL (ref 8.6–10.3)
Chloride: 103 mmol/L (ref 98–110)
Creat: 1.15 mg/dL (ref 0.70–1.30)
Globulin: 2.9 g/dL (ref 1.9–3.7)
Glucose, Bld: 169 mg/dL — ABNORMAL HIGH (ref 65–139)
Potassium: 4.4 mmol/L (ref 3.5–5.3)
Sodium: 139 mmol/L (ref 135–146)
Total Bilirubin: 0.4 mg/dL (ref 0.2–1.2)
Total Protein: 6.8 g/dL (ref 6.1–8.1)
eGFR: 75 mL/min/1.73m2 (ref >=60)

## 2023-08-09 LAB — TSH: TSH: 2.03 m[IU]/L (ref 0.40–4.50)

## 2023-09-21 ENCOUNTER — Other Ambulatory Visit: Payer: Self-pay | Admitting: Emergency Medicine

## 2023-09-21 DIAGNOSIS — I10 Essential (primary) hypertension: Secondary | ICD-10-CM

## 2023-09-21 DIAGNOSIS — E1052 Type 1 diabetes mellitus with diabetic peripheral angiopathy with gangrene: Secondary | ICD-10-CM

## 2023-11-26 ENCOUNTER — Encounter: Payer: Self-pay | Admitting: Emergency Medicine

## 2023-12-04 ENCOUNTER — Other Ambulatory Visit: Payer: Self-pay

## 2023-12-04 MED ORDER — DEXCOM G7 SENSOR MISC: 3 refills | Status: AC

## 2023-12-04 MED ORDER — NOVOLOG FLEXPEN 100 UNIT/ML ~~LOC~~ SOPN: 5.0000 [IU] | PEN_INJECTOR | Freq: Four times a day (QID) | SUBCUTANEOUS | 3 refills | Status: DC

## 2023-12-06 ENCOUNTER — Telehealth: Payer: Self-pay

## 2023-12-06 NOTE — Telephone Encounter (Signed)
 Pt needs a PA for Dexcom G7 and Novolog 

## 2023-12-09 ENCOUNTER — Other Ambulatory Visit (HOSPITAL_COMMUNITY): Payer: Self-pay

## 2023-12-09 ENCOUNTER — Ambulatory Visit: Admitting: Internal Medicine

## 2023-12-09 ENCOUNTER — Telehealth: Payer: Self-pay

## 2023-12-09 NOTE — Telephone Encounter (Signed)
 Pharmacy Patient Advocate Encounter   Received notification from Pt Calls Messages that prior authorization for Novolog  is required/requested.   Insurance verification completed.   The patient is insured through [employer].   Per test claim:  Insulin  Lispro (generic humalog ) is preferred by the insurance.  If suggested medication is appropriate, Please send in a new RX and discontinue this one. If not, please advise as to why it's not appropriate so that we may request a Prior Authorization. Please note, some preferred medications may still require a PA.  If the suggested medications have not been trialed and there are no contraindications to their use, the PA will not be submitted, as it will not be approved.

## 2023-12-09 NOTE — Telephone Encounter (Signed)
 Pharmacy Patient Advocate Encounter   Received notification from Pt Calls Messages that prior authorization for Dexcom G7 sensor is required/requested.   Insurance verification completed.   The patient is insured through [employer].   Per test claim: PA required; PA submitted to above mentioned insurance via Latent Key/confirmation #/EOC [id number] Status is pending

## 2023-12-10 ENCOUNTER — Ambulatory Visit (INDEPENDENT_AMBULATORY_CARE_PROVIDER_SITE_OTHER): Payer: Self-pay | Admitting: Emergency Medicine

## 2023-12-10 ENCOUNTER — Encounter: Payer: Self-pay | Admitting: Emergency Medicine

## 2023-12-10 VITALS — BP 140/80 | HR 90 | Temp 98.1°F | Ht 72.0 in | Wt 182.0 lb

## 2023-12-10 DIAGNOSIS — Z23 Encounter for immunization: Secondary | ICD-10-CM

## 2023-12-10 DIAGNOSIS — Z0000 Encounter for general adult medical examination without abnormal findings: Secondary | ICD-10-CM

## 2023-12-10 DIAGNOSIS — I152 Hypertension secondary to endocrine disorders: Secondary | ICD-10-CM | POA: Diagnosis not present

## 2023-12-10 DIAGNOSIS — I10 Essential (primary) hypertension: Secondary | ICD-10-CM

## 2023-12-10 DIAGNOSIS — E785 Hyperlipidemia, unspecified: Secondary | ICD-10-CM

## 2023-12-10 DIAGNOSIS — E1052 Type 1 diabetes mellitus with diabetic peripheral angiopathy with gangrene: Secondary | ICD-10-CM | POA: Diagnosis not present

## 2023-12-10 DIAGNOSIS — Z0001 Encounter for general adult medical examination with abnormal findings: Secondary | ICD-10-CM

## 2023-12-10 DIAGNOSIS — E1169 Type 2 diabetes mellitus with other specified complication: Secondary | ICD-10-CM

## 2023-12-10 MED ORDER — AMLODIPINE BESYLATE 5 MG PO TABS: 5.0000 mg | ORAL_TABLET | Freq: Every day | ORAL | 3 refills | Status: AC

## 2023-12-10 MED ORDER — INSULIN LISPRO (1 UNIT DIAL) 100 UNIT/ML (KWIKPEN): 8.0000 [IU] | PEN_INJECTOR | Freq: Three times a day (TID) | SUBCUTANEOUS | 5 refills | Status: DC

## 2023-12-10 MED ORDER — LISINOPRIL 20 MG PO TABS
20.0000 mg | ORAL_TABLET | Freq: Every day | ORAL | 3 refills | Status: AC
Start: 2023-12-10 — End: ?

## 2023-12-10 MED ORDER — ATORVASTATIN CALCIUM 20 MG PO TABS: 20.0000 mg | ORAL_TABLET | Freq: Every day | ORAL | 3 refills | Status: AC

## 2023-12-10 NOTE — Patient Instructions (Signed)
 Health Maintenance, Male  Adopting a healthy lifestyle and getting preventive care are important in promoting health and wellness. Ask your health care provider about:  The right schedule for you to have regular tests and exams.  Things you can do on your own to prevent diseases and keep yourself healthy.  What should I know about diet, weight, and exercise?  Eat a healthy diet    Eat a diet that includes plenty of vegetables, fruits, low-fat dairy products, and lean protein.  Do not eat a lot of foods that are high in solid fats, added sugars, or sodium.  Maintain a healthy weight  Body mass index (BMI) is a measurement that can be used to identify possible weight problems. It estimates body fat based on height and weight. Your health care provider can help determine your BMI and help you achieve or maintain a healthy weight.  Get regular exercise  Get regular exercise. This is one of the most important things you can do for your health. Most adults should:  Exercise for at least 150 minutes each week. The exercise should increase your heart rate and make you sweat (moderate-intensity exercise).  Do strengthening exercises at least twice a week. This is in addition to the moderate-intensity exercise.  Spend less time sitting. Even light physical activity can be beneficial.  Watch cholesterol and blood lipids  Have your blood tested for lipids and cholesterol at 56 years of age, then have this test every 5 years.  You may need to have your cholesterol levels checked more often if:  Your lipid or cholesterol levels are high.  You are older than 56 years of age.  You are at high risk for heart disease.  What should I know about cancer screening?  Many types of cancers can be detected early and may often be prevented. Depending on your health history and family history, you may need to have cancer screening at various ages. This may include screening for:  Colorectal cancer.  Prostate cancer.  Skin cancer.  Lung  cancer.  What should I know about heart disease, diabetes, and high blood pressure?  Blood pressure and heart disease  High blood pressure causes heart disease and increases the risk of stroke. This is more likely to develop in people who have high blood pressure readings or are overweight.  Talk with your health care provider about your target blood pressure readings.  Have your blood pressure checked:  Every 3-5 years if you are 24-52 years of age.  Every year if you are 3 years old or older.  If you are between the ages of 60 and 72 and are a current or former smoker, ask your health care provider if you should have a one-time screening for abdominal aortic aneurysm (AAA).  Diabetes  Have regular diabetes screenings. This checks your fasting blood sugar level. Have the screening done:  Once every three years after age 66 if you are at a normal weight and have a low risk for diabetes.  More often and at a younger age if you are overweight or have a high risk for diabetes.  What should I know about preventing infection?  Hepatitis B  If you have a higher risk for hepatitis B, you should be screened for this virus. Talk with your health care provider to find out if you are at risk for hepatitis B infection.  Hepatitis C  Blood testing is recommended for:  Everyone born from [birth year] through 1963.  Anyone  with known risk factors for hepatitis C.  Sexually transmitted infections (STIs)  You should be screened each year for STIs, including gonorrhea and chlamydia, if:  You are sexually active and are younger than 56 years of age.  You are older than 56 years of age and your health care provider tells you that you are at risk for this type of infection.  Your sexual activity has changed since you were last screened, and you are at increased risk for chlamydia or gonorrhea. Ask your health care provider if you are at risk.  Ask your health care provider about whether you are at high risk for HIV. Your health care provider  may recommend a prescription medicine to help prevent HIV infection. If you choose to take medicine to prevent HIV, you should first get tested for HIV. You should then be tested every 3 months for as long as you are taking the medicine.  Follow these instructions at home:  Alcohol use  Do not drink alcohol if your health care provider tells you not to drink.  If you drink alcohol:  Limit how much you have to 0-2 drinks a day.  Know how much alcohol is in your drink. In the [place] one drink equals one 12 oz bottle of beer (355 mL), one 5 oz glass of wine (148 mL), or one 1 oz glass of hard liquor (44 mL).  Lifestyle  Do not use any products that contain nicotine or tobacco. These products include cigarettes, chewing tobacco, and vaping devices, such as e-cigarettes. If you need help quitting, ask your health care provider.  Do not use street drugs.  Do not share needles.  Ask your health care provider for help if you need support or information about quitting drugs.  General instructions  Schedule regular health, dental, and eye exams.  Stay current with your vaccines.  Tell your health care provider if:  You often feel depressed.  You have ever been abused or do not feel safe at home.  Summary  Adopting a healthy lifestyle and getting preventive care are important in promoting health and wellness.  Follow your health care provider's instructions about healthy diet, exercising, and getting tested or screened for diseases.  Follow your health care provider's instructions on monitoring your cholesterol and blood pressure.  This information is not intended to replace advice given to you by your health care provider. Make sure you discuss any questions you have with your health care provider.  Document Revised: 05/30/2020 Document Reviewed: 05/30/2020  Elsevier Patient Education  2024 [employer].

## 2023-12-10 NOTE — Assessment & Plan Note (Signed)
 BP Readings from Last 3 Encounters:  12/10/23 (!) 140/80  08/08/23 138/80  10/15/22 132/86  Elevated blood pressure readings in the office and at home Recommend to continue amlodipine  5 mg daily and increase lisinopril  to 20 mg daily Lab Results  Component Value Date   HGBA1C 8.9 (A) 08/08/2023  Uncontrolled diabetes Presently on insulin  glargine 12 units daily and Premeal insulin  lispro 8 to 10 units as per sliding scale Follows up with endocrinologist on a regular basis Type 1 diabetes diagnosed at age 27 Diet and nutrition discussed Cardiovascular risks associated with hypertension and diabetes discussed

## 2023-12-10 NOTE — Assessment & Plan Note (Signed)
 Continue diabetes medications as per endocrinologist advised Continues atorvastatin  20 mg daily Diet and nutrition discussed Cardiovascular risks associated with diabetes and dyslipidemia discussed

## 2023-12-10 NOTE — Progress Notes (Signed)
 Gordon Walker 58 y.o.   Chief Complaint  Patient presents with   Annual Exam    HISTORY OF PRESENT ILLNESS: This is a 58 y.o. male here for annual exam and follow-up of chronic medical conditions including hypertension and diabetes. Sees endocrinologist on a regular basis.  Type 1 diabetes. Compliant with medications. Has no complaints or medical concerns today.  HPI   Prior to Admission medications   Medication Sig Start Date End Date Taking? Authorizing Provider  lisinopril  (ZESTRIL ) 20 MG tablet Take 1 tablet (20 mg total) by mouth daily. 12/10/23  Yes Praise Dolecki, Emil Schanz, MD  amLODipine  (NORVASC ) 5 MG tablet Take 1 tablet (5 mg total) by mouth daily. 12/10/23   Purcell Emil Schanz, MD  aspirin  EC 81 MG tablet Take 81 mg by mouth daily.    [provider]  atorvastatin  (LIPITOR) 20 MG tablet Take 1 tablet (20 mg total) by mouth daily. 12/10/23   Purcell Emil Schanz, MD  Continuous Glucose Sensor (DEXCOM G7 SENSOR) MISC Use to check glucose continuously, change sensor every 10 days 12/04/23   Trixie File, MD  Glucagon  3 MG/DOSE POWD Place 3 mg into the nose once as needed for up to 1 dose. 08/08/23   Trixie File, MD  Insulin  Glargine (BASAGLAR  KWIKPEN) 100 UNIT/ML Inject 12 Units into the skin daily. 08/08/23   Trixie File, MD  insulin  lispro (HUMALOG ) 100 UNIT/ML KwikPen Inject 8-10 Units into the skin 3 (three) times daily. 12/10/23   Trixie File, MD  Insulin  Pen Needle 32G X 4 MM MISC Use 4x a day - Techlite pen needles 11/30/22   Trixie File, MD  Multiple Vitamin (MULTIVITAMIN WITH MINERALS) TABS tablet Take 1 tablet by mouth daily.    [provider]  nitroGLYCERIN  (NITRODUR - DOSED IN MG/24 HR) 0.2 mg/hr patch APPLY 1 PATCH TOPICALLY ONCE DAILY 02/11/20   Persons, Ronal Dragon, PA  pantoprazole  (PROTONIX ) 40 MG tablet Take 1 tablet (40 mg total) by mouth daily. 10/05/16   Rai, Nydia POUR, MD  SSD 1 % cream APPLY  CREAM  EXTERNALLY TO AFFECTED AREA ONCE DAILY 09/26/21   Barbarann Oneil BROCKS, MD    No Known Allergies  Patient Active Problem List   Diagnosis Date Noted   Positive colorectal cancer screening using Cologuard test 10/15/2022   Hyperlipidemia 09/19/2021   Hypertension associated with diabetes (HCC) 10/03/2016   Sludge in gallbladder    Status post below knee amputation of right lower extremity (HCC) 01/20/2016   Dyslipidemia associated with type 2 diabetes mellitus (HCC)    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, with long-term current use of insulin  (HCC)    Anemia 01/06/2015    Past Medical History:  Diagnosis Date   Complication of anesthesia    anesthesia complication 01/18/16; pt unaware what happened    Dehiscence of amputation stump (HCC)    right   Diabetes mellitus without complication (HCC)    Type 1   Heart murmur    years ago   Hypertension    years ago   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, with long-term current use of insulin  [facility])     Past Surgical History:  Procedure Laterality Date   AMPUTATION Left 01/07/2015   Procedure: Left Foot 1st Ray Amputation;  Surgeon: Jerona Harden GAILS, MD;  Location: [facility] OR;  Service: Orthopedics;  Laterality: Left;   AMPUTATION Left 07/15/2015   Procedure: Left Transmetatarsal Amputation;  Surgeon: Jerona GAILS  Harden, MD;  Location: [facility] OR;  Service: Orthopedics;  Laterality: Left;   AMPUTATION Right 12/30/2015   Procedure: 5th Ray Amputation Right Foot;  Surgeon: Jerona LULLA Harden, MD;  Location: [facility] OR;  Service: Orthopedics;  Laterality: Right;   AMPUTATION Right 01/15/2016   Procedure: GUILLOTINE AMPUTATION  ABOVE THE ANKLE AMPUTATION;  Surgeon: Oneil JAYSON Herald, MD;  Location: [facility] OR;  Service: Orthopedics;  Laterality: Right;   AMPUTATION Right 01/18/2016   Procedure: AMPUTATION BELOW KNEE;  Surgeon: Oneil JAYSON Herald, MD;  Location: [facility] OR;  Service: Orthopedics;  Laterality: Right;    STUMP REVISION Right 03/21/2016   Procedure: Right Below Knee Amputation Stump Revision, WOUND VAC application;  Surgeon: Oneil JAYSON Herald, MD;  Location: [facility] OR;  Service: Orthopedics;  Laterality: Right;    Social History   Socioeconomic History   Marital status: Married    Spouse name: Not on file   Number of children: Not on file   Years of education: Not on file   Highest education level: Master's degree (e.g., MA, MS, MEng, MEd, MSW, MBA)  Occupational History   Not on file  Tobacco Use   Smoking status: Never   Smokeless tobacco: Never  Substance and Sexual Activity   Alcohol use: No   Drug use: No   Sexual activity: Not on file  Other Topics Concern   Not on file  Social History Narrative   Not on file   Social Drivers of Health   Financial Resource Strain: Low Risk  (10/15/2022)   Overall Financial Resource Strain (CARDIA)    Difficulty of Paying Living Expenses: Not very hard  Food Insecurity: No Food Insecurity (10/15/2022)   Hunger Vital Sign    Worried About Running Out of Food in the Last Year: Never true    Ran Out of Food in the Last Year: Never true  Transportation Needs: No Transportation Needs (10/15/2022)   PRAPARE - Administrator, Civil Service (Medical): No    Lack of Transportation (Non-Medical): No  Physical Activity: Insufficiently Active (10/15/2022)   Exercise Vital Sign    Days of Exercise per Week: 2 days    Minutes of Exercise per Session: 30 min  Stress: No Stress Concern Present (10/15/2022)   [employer] of Occupational Health - Occupational Stress Questionnaire    Feeling of Stress : Only a little  Social Connections: Unknown (10/15/2022)   Social Connection and Isolation Panel    Frequency of Communication with Friends and Family: Three times a week    Frequency of Social Gatherings with Friends and Family: Twice a week    Attends Religious Services: Patient declined    Database Administrator or Organizations: No    Attends  Engineer, Structural: Not on file    Marital Status: Married  Catering Manager Violence: Not on file    Family History  Problem Relation Age of Onset   Hypertension Mother    Diabetes Mother    Cancer Mother    Prostate cancer Father      Review of Systems  Constitutional: Negative.  Negative for chills and fever.  HENT: Negative.  Negative for congestion and sore throat.   Respiratory: Negative.  Negative for cough and shortness of breath.   Cardiovascular: Negative.  Negative for chest pain and palpitations.  Gastrointestinal:  Negative for abdominal pain, diarrhea, nausea and vomiting.  Genitourinary: Negative.  Negative for dysuria and hematuria.  Skin: Negative.  Negative for rash.  Neurological:  Negative for dizziness and headaches.  All other systems reviewed and are negative.   Vitals:   12/10/23 1402  BP: (!) 140/80  Pulse: 90  Temp: 98.1 F (36.7 C)  SpO2: 97%    Physical Exam Vitals reviewed.  Constitutional:      Appearance: Normal appearance.  HENT:     Head: Normocephalic.     Right Ear: Tympanic membrane, ear canal and external ear normal.     Left Ear: Tympanic membrane, ear canal and external ear normal.     Mouth/Throat:     Mouth: Mucous membranes are moist.     Pharynx: Oropharynx is clear.  Eyes:     Extraocular Movements: Extraocular movements intact.     Conjunctiva/sclera: Conjunctivae normal.     Pupils: Pupils are equal, round, and reactive to light.  Cardiovascular:     Rate and Rhythm: Normal rate and regular rhythm.     Pulses: Normal pulses.     Heart sounds: Normal heart sounds.  Pulmonary:     Effort: Pulmonary effort is normal.     Breath sounds: Normal breath sounds.  Abdominal:     Palpations: Abdomen is soft.     Tenderness: There is no abdominal tenderness.  Musculoskeletal:     Cervical back: No tenderness.  Lymphadenopathy:     Cervical: No cervical adenopathy.  Skin:    General: Skin is warm and dry.      Capillary Refill: Capillary refill takes less than 2 seconds.  Neurological:     General: No focal deficit present.     Mental Status: He is alert and oriented to person, place, and time.  Psychiatric:        Mood and Affect: Mood normal.        Behavior: Behavior normal.      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, with long-term current use of insulin  (HCC)   Relevant Medications   amLODipine  (NORVASC ) 5 MG tablet   atorvastatin  (LIPITOR) 20 MG tablet   lisinopril  (ZESTRIL ) 20 MG tablet   Hypertension associated with diabetes (HCC)   BP Readings from Last 3 Encounters:  12/10/23 (!) 140/80  08/08/23 138/80  10/15/22 132/86  Elevated blood pressure readings in the office and at home Recommend to continue amlodipine  5 mg daily and increase lisinopril  to 20 mg daily Lab Results  Component Value Date   HGBA1C 8.9 (A) 08/08/2023  Uncontrolled diabetes Presently on insulin  glargine 12 units daily and Premeal insulin  lispro 8 to 10 units as per sliding scale Follows up with endocrinologist on a regular basis Type 1 diabetes diagnosed at age 18 Diet and nutrition discussed Cardiovascular risks associated with hypertension and diabetes discussed        Relevant Medications   amLODipine  (NORVASC ) 5 MG tablet   atorvastatin  (LIPITOR) 20 MG tablet   lisinopril  (ZESTRIL ) 20 MG tablet     Endocrine   Dyslipidemia associated with type 2 diabetes mellitus (HCC)   Continue diabetes medications as per endocrinologist advised Continues atorvastatin  20 mg daily Diet and nutrition discussed Cardiovascular risks associated with diabetes and dyslipidemia discussed      Relevant Medications   atorvastatin  (LIPITOR) 20 MG tablet   lisinopril  (ZESTRIL ) 20 MG tablet   Other Visit Diagnoses       Encounter for general adult medical examination with  abnormal findings    -  Primary     Essential hypertension       Relevant Medications   amLODipine  (NORVASC ) 5 MG tablet   atorvastatin  (LIPITOR) 20 MG tablet   lisinopril  (ZESTRIL ) 20 MG tablet     Flu vaccine need       Relevant Orders   Flu vaccine trivalent PF, 6mos and older(Flulaval,Afluria,Fluarix,Fluzone) (Completed)      Modifiable risk factors discussed with patient. Anticipatory guidance according to age provided. The following topics were also discussed: Social Determinants of Health Smoking.  Non-smoker Diet and nutrition Benefits of exercise Cancer screening and review of most recent colonoscopy report Vaccinations review and recommendations Cardiovascular risk assessment The 10-year ASCVD risk score (Arnett DK, et al., 2019) is: 19.4%   Values used to calculate the score:     Age: 29 years     Clincally relevant sex: Male     Is Non-Hispanic African American: Yes     Diabetic: Yes     Tobacco smoker: No     Systolic Blood Pressure: 140 mmHg     Is BP treated: Yes     HDL Cholesterol: 73 mg/dL     Total Cholesterol: 132 mg/dL Review of multiple chronic medical conditions under management Review of all medications and changes made Mental health including depression and anxiety Fall and accident prevention  Patient Instructions  Health Maintenance, Male Adopting a healthy lifestyle and getting preventive care are important in promoting health and wellness. Ask your health care provider about: The right schedule for you to have regular tests and exams. Things you can do on your own to prevent diseases and keep yourself healthy. What should I know about diet, weight, and exercise? Eat a healthy diet  Eat a diet that includes plenty of vegetables, fruits, low-fat dairy products, and lean protein. Do not eat a lot of foods that are high in solid fats, added sugars, or sodium. Maintain a healthy weight Body mass index (BMI) is a measurement that can be used to  identify possible weight problems. It estimates body fat based on height and weight. Your health care provider can help determine your BMI and help you achieve or maintain a healthy weight. Get regular exercise Get regular exercise. This is one of the most important things you can do for your health. Most adults should: Exercise for at least 150 minutes each week. The exercise should increase your heart rate and make you sweat (moderate-intensity exercise). Do strengthening exercises at least twice a week. This is in addition to the moderate-intensity exercise. Spend less time sitting. Even light physical activity can be beneficial. Watch cholesterol and blood lipids Have your blood tested for lipids and cholesterol at 56 years of age, then have this test every 5 years. You may need to have your cholesterol levels checked more often if: Your lipid or cholesterol levels are high. You are older than 56 years of age. You are at high risk for heart disease. What should I know about cancer screening? Many types of cancers can be detected early and may often be prevented. Depending on your health history and family history, you may need to have cancer screening at various ages. This may include screening for: Colorectal cancer. Prostate cancer. Skin cancer. Lung cancer. What should I know about heart disease, diabetes, and high blood pressure? Blood pressure and heart disease High blood pressure causes heart disease and increases the risk of stroke. This is more likely to develop in people who have high blood pressure readings  or are overweight. Talk with your health care provider about your target blood pressure readings. Have your blood pressure checked: Every 3-5 years if you are 21-28 years of age. Every year if you are 51 years old or older. If you are between the ages of 42 and 97 and are a current or former smoker, ask your health care provider if you should have a one-time screening for  abdominal aortic aneurysm (AAA). Diabetes Have regular diabetes screenings. This checks your fasting blood sugar level. Have the screening done: Once every three years after age 20 if you are at a normal weight and have a low risk for diabetes. More often and at a younger age if you are overweight or have a high risk for diabetes. What should I know about preventing infection? Hepatitis B If you have a higher risk for hepatitis B, you should be screened for this virus. Talk with your health care provider to find out if you are at risk for hepatitis B infection. Hepatitis C Blood testing is recommended for: Everyone born from [birth year] through 1963. Anyone with known risk factors for hepatitis C. Sexually transmitted infections (STIs) You should be screened each year for STIs, including gonorrhea and chlamydia, if: You are sexually active and are younger than 56 years of age. You are older than 56 years of age and your health care provider tells you that you are at risk for this type of infection. Your sexual activity has changed since you were last screened, and you are at increased risk for chlamydia or gonorrhea. Ask your health care provider if you are at risk. Ask your health care provider about whether you are at high risk for HIV. Your health care provider may recommend a prescription medicine to help prevent HIV infection. If you choose to take medicine to prevent HIV, you should first get tested for HIV. You should then be tested every 3 months for as long as you are taking the medicine. Follow these instructions at home: Alcohol use Do not drink alcohol if your health care provider tells you not to drink. If you drink alcohol: Limit how much you have to 0-2 drinks a day. Know how much alcohol is in your drink. In the [place] one drink equals one 12 oz bottle of beer (355 mL), one 5 oz glass of wine (148 mL), or one 1 oz glass of hard liquor (44 mL). Lifestyle Do not use any products that  contain nicotine or tobacco. These products include cigarettes, chewing tobacco, and vaping devices, such as e-cigarettes. If you need help quitting, ask your health care provider. Do not use street drugs. Do not share needles. Ask your health care provider for help if you need support or information about quitting drugs. General instructions Schedule regular health, dental, and eye exams. Stay current with your vaccines. Tell your health care provider if: You often feel depressed. You have ever been abused or do not feel safe at home. Summary Adopting a healthy lifestyle and getting preventive care are important in promoting health and wellness. Follow your health care provider's instructions about healthy diet, exercising, and getting tested or screened for diseases. Follow your health care provider's instructions on monitoring your cholesterol and blood pressure. This information is not intended to replace advice given to you by your health care provider. Make sure you discuss any questions you have with your health care provider. Document Revised: 05/30/2020 Document Reviewed: 05/30/2020 Elsevier Patient Education  2024 [employer].  Emil Schaumann, MD [facility]

## 2023-12-10 NOTE — Telephone Encounter (Signed)
 Pt has been notified and rx for humalog  has been sent.

## 2023-12-10 NOTE — Addendum Note (Signed)
 Addended by: CLEOTILDE ROLIN RAMAN on: 12/10/2023 10:19 AM   Modules accepted: Orders

## 2023-12-17 ENCOUNTER — Other Ambulatory Visit: Payer: Self-pay | Admitting: Emergency Medicine

## 2023-12-17 DIAGNOSIS — I10 Essential (primary) hypertension: Secondary | ICD-10-CM

## 2023-12-24 ENCOUNTER — Ambulatory Visit: Admitting: Internal Medicine

## 2024-01-01 ENCOUNTER — Ambulatory Visit: Admitting: Internal Medicine

## 2024-01-29 ENCOUNTER — Encounter: Payer: Self-pay | Admitting: Pharmacy Technician

## 2024-01-29 ENCOUNTER — Other Ambulatory Visit (HOSPITAL_COMMUNITY): Payer: Self-pay

## 2024-01-29 NOTE — Telephone Encounter (Signed)
"  error  "

## 2024-02-19 ENCOUNTER — Ambulatory Visit: Admitting: Internal Medicine

## 2024-02-20 ENCOUNTER — Encounter: Payer: Self-pay | Admitting: Internal Medicine

## 2024-02-20 ENCOUNTER — Ambulatory Visit: Admitting: Internal Medicine

## 2024-02-20 VITALS — BP 120/70 | HR 89 | Ht 72.0 in | Wt 183.0 lb

## 2024-02-20 DIAGNOSIS — E785 Hyperlipidemia, unspecified: Secondary | ICD-10-CM | POA: Diagnosis not present

## 2024-02-20 DIAGNOSIS — E1052 Type 1 diabetes mellitus with diabetic peripheral angiopathy with gangrene: Secondary | ICD-10-CM

## 2024-02-20 DIAGNOSIS — E010 Iodine-deficiency related diffuse (endemic) goiter: Secondary | ICD-10-CM | POA: Diagnosis not present

## 2024-02-20 LAB — POCT GLYCOSYLATED HEMOGLOBIN (HGB A1C): Hemoglobin A1C: 10.7 % — AB (ref 4.0–5.6)

## 2024-02-20 MED ORDER — BASAGLAR KWIKPEN 100 UNIT/ML ~~LOC~~ SOPN: 25.0000 [IU] | PEN_INJECTOR | Freq: Every day | SUBCUTANEOUS | 3 refills | Status: AC

## 2024-02-20 MED ORDER — NOVOLOG FLEXPEN 100 UNIT/ML ~~LOC~~ SOPN: 10.0000 [IU] | PEN_INJECTOR | Freq: Three times a day (TID) | SUBCUTANEOUS | 3 refills | Status: AC

## 2024-02-20 NOTE — Patient Instructions (Addendum)
 Please increase: - Basaglar  15 units at night and 10 units in am - Novolog  - 15 min before you start eating 10-12 units before b'fast 10-12 units before lunch 10-12 units before dinner 3-4 units before a snack 1-2 units for correction   Please return in 1.5 months.

## 2024-02-20 NOTE — Progress Notes (Signed)
 Patient ID: Gordon Walker, male   DOB: 1965-01-29, 59 y.o.   MRN: [id number]  HPI: Gordon Walker is a 59 y.o.-year-old male, returning for follow-up for DM1, dx in 1989, insulin -dependent since dx, uncontrolled, with complications (PAD, CKD, DR, bilateral leg amputations, PN). Pt. previously saw Dr. Kassie, but last visit 6 months ago.  He is not usually compliant with the recommended appointments and since last visit he had 4 cancellations/no-shows.  He also missed quite a few appointments prior to this.  Interim history: No increased urination, blurry vision, nausea, chest pain. He started a new job this year -not working nights anymore, now working 6 am to 3 pm. He had retinal surgery last mo >> was on steroids >> sugars higher.  Reviewed HbA1c: Lab Results  Component Value Date   HGBA1C 8.9 (A) 08/08/2023   HGBA1C 7.1 (A) 09/20/2022   HGBA1C 6.7 (A) 04/05/2022   HGBA1C 8.0 (A) 09/19/2021   HGBA1C 7.4 (A) 05/25/2021   HGBA1C 9.1 (A) 03/16/2021   HGBA1C 7.4 (A) 10/11/2020   HGBA1C 7.2 (A) 06/06/2020   HGBA1C 6.6 (A) 12/08/2019   HGBA1C 7.2 (A) 09/07/2019   He was previously on: - Lantus  4-6 units at midnight - Humalog  (16)-(16)-(8-12) units 3x a day usually after he starts eating Review of Dr. Laymond note, he refused an insulin  pump in the past.  At last visit he was on: - Lantus  6-8 >> 8 >> 6-8 units at midnight (work) -now Basaglar  - Humalog  8-16 >> 8 units before meals, 4-5 units before a snack >> 15 min before meals - now NovoLog  4-6 units before b'fast 6-8 units before lunch 10-12 units before dinner 3-4 units before a snack 1-2 units for correction  I advised him to change to: - Basaglar  12 units at waking up >> now at 11 pm - Novolog  - 15 min before you start eating 4-6 >> taking 8-10 units before b'fast  10-12 >> taking 9 units before lunch 10-12 >> taking 9 units before dinner 3-4 units before a snack 1-2 units for correction  Pt checks his sugars  >4x a day with his Dexcom CGM and they are: - am: 136, 170-230s - 2h after b'fast: n/c - lunch: 220-270 - 2h after lunch: n/c - dinner: 200-265 - 2h after dinner: 190-205 - bedtime: n/c  Previously:  Prev.:   Lowest sugar was 50s >> 40s; he has hypoglycemia awareness at 70.  Highest sugar was  400s >> 400s.  Glucometer: Relion  Pt's meals are: - Breakfast (5 pm): fast food (on the way to work) or sandwich  - Lunch: take out or home cooked - Dinner: take out or home cooked - Snacks: at night  - + CKD, last BUN/creatinine:  Lab Results  Component Value Date   BUN 17 08/08/2023   BUN 17 10/15/2022   CREATININE 1.15 08/08/2023   CREATININE 1.17 10/15/2022   Lab Results  Component Value Date   MICRALBCREAT 33 (H) 08/08/2023  He is on lisinopril  10 mg daily.  - + HL; last set of lipids: Lab Results  Component Value Date   CHOL 132 08/08/2023   HDL 73 08/08/2023   LDLCALC 38 08/08/2023   TRIG 124 08/08/2023   CHOLHDL 1.8 08/08/2023  He is on Lipitor 20 mg daily  - last eye exam was in 11/2021: + DR (severe), + glaucoma. He had laser Sx 2015, 2016.  - Last foot exam 10/11/2020. He had R BKA and L transmetatarsal amputation.  He also has a history of HTN.  Lab Results  Component Value Date   TSH 2.03 08/08/2023   ROS: + see HPI  Past Medical History:  Diagnosis Date   Complication of anesthesia    anesthesia complication 01/18/16; pt unaware what happened    Dehiscence of amputation stump (HCC)    right   Diabetes mellitus without complication (HCC)    Type 1   Heart murmur    years ago   Hypertension    years ago   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, with long-term current use of insulin  [facility])    Past Surgical History:  Procedure Laterality Date   AMPUTATION Left 01/07/2015   Procedure: Left Foot 1st Ray Amputation;  Surgeon: Jerona Harden GAILS, MD;  Location: [facility] OR;  Service: Orthopedics;  Laterality: Left;    AMPUTATION Left 07/15/2015   Procedure: Left Transmetatarsal Amputation;  Surgeon: Jerona GAILS Harden, MD;  Location: [facility] OR;  Service: Orthopedics;  Laterality: Left;   AMPUTATION Right 12/30/2015   Procedure: 5th Ray Amputation Right Foot;  Surgeon: Jerona GAILS Harden, MD;  Location: [facility] OR;  Service: Orthopedics;  Laterality: Right;   AMPUTATION Right 01/15/2016   Procedure: GUILLOTINE AMPUTATION  ABOVE THE ANKLE AMPUTATION;  Surgeon: Oneil JAYSON Herald, MD;  Location: [facility] OR;  Service: Orthopedics;  Laterality: Right;   AMPUTATION Right 01/18/2016   Procedure: AMPUTATION BELOW KNEE;  Surgeon: Oneil JAYSON Herald, MD;  Location: [facility] OR;  Service: Orthopedics;  Laterality: Right;   STUMP REVISION Right 03/21/2016   Procedure: Right Below Knee Amputation Stump Revision, WOUND VAC application;  Surgeon: Oneil JAYSON Herald, MD;  Location: [facility] OR;  Service: Orthopedics;  Laterality: Right;   Social History   Socioeconomic History   Marital status: Married    Spouse name: Not on file   Number of children: Not on file   Years of education: Not on file   Highest education level: Master's degree (e.g., MA, MS, MEng, MEd, MSW, MBA)  Occupational History   Not on file  Tobacco Use   Smoking status: Never   Smokeless tobacco: Never  Substance and Sexual Activity   Alcohol use: No   Drug use: No   Sexual activity: Not on file  Other Topics Concern   Not on file  Social History Narrative   Not on file   Social Drivers of Health   Tobacco Use: Low Risk (12/10/2023)   Patient History    Smoking Tobacco Use: Never    Smokeless Tobacco Use: Never    Passive Exposure: Not on file  Financial Resource Strain: Low Risk (10/15/2022)   Overall Financial Resource Strain (CARDIA)    Difficulty of Paying Living Expenses: Not very hard  Food Insecurity: No Food Insecurity (10/15/2022)   Hunger Vital Sign    Worried About Running Out of Food in the Last Year: Never true    Ran Out of Food in the Last Year: Never true   Transportation Needs: No Transportation Needs (10/15/2022)   PRAPARE - Administrator, Civil Service (Medical): No    Lack of Transportation (Non-Medical): No  Physical Activity: Insufficiently Active (10/15/2022)   Exercise Vital Sign    Days of Exercise per Week: 2 days    Minutes of Exercise per Session: 30 min  Stress: No Stress Concern Present (10/15/2022)   [employer] of Occupational Health - Occupational Stress Questionnaire    Feeling of Stress : Only a little  Social Connections: Unknown (10/15/2022)   Social Connection and Isolation Panel    Frequency of Communication with Friends and Family: Three times a week    Frequency of Social Gatherings with Friends and Family: Twice a week    Attends Religious Services: Patient declined    Active Member of Clubs or Organizations: No    Attends Engineer, Structural: Not on file    Marital Status: Married  Catering Manager Violence: Not on file  Depression (PHQ2-9): Low Risk (12/10/2023)   Depression (PHQ2-9)    PHQ-2 Score: 0  Alcohol Screen: Low Risk (10/15/2022)   Alcohol Screen    Last Alcohol Screening Score (AUDIT): 1  Housing: Low Risk (10/15/2022)   Housing    Last Housing Risk Score: 0  Utilities: Not on file  Health Literacy: Not on file   Current Outpatient Medications on File Prior to Visit  Medication Sig Dispense Refill   amLODipine  (NORVASC ) 5 MG tablet Take 1 tablet (5 mg total) by mouth daily. 90 tablet 3   aspirin  EC 81 MG tablet Take 81 mg by mouth daily.     atorvastatin  (LIPITOR) 20 MG tablet Take 1 tablet (20 mg total) by mouth daily. 90 tablet 3   Continuous Glucose Sensor (DEXCOM G7 SENSOR) MISC Use to check glucose continuously, change sensor every 10 days 9 each 3   Glucagon  3 MG/DOSE POWD Place 3 mg into the nose once as needed for up to 1 dose. 1 each 11   Insulin  Glargine (BASAGLAR  KWIKPEN) 100 UNIT/ML Inject 12 Units into the skin daily. 30 mL 3   insulin  lispro (HUMALOG )  100 UNIT/ML KwikPen Inject 8-10 Units into the skin 3 (three) times daily. 15 mL 5   Insulin  Pen Needle 32G X 4 MM MISC Use 4x a day - Techlite pen needles 400 each 3   lisinopril  (ZESTRIL ) 20 MG tablet Take 1 tablet (20 mg total) by mouth daily. 90 tablet 3   Multiple Vitamin (MULTIVITAMIN WITH MINERALS) TABS tablet Take 1 tablet by mouth daily.     nitroGLYCERIN  (NITRODUR - DOSED IN MG/24 HR) 0.2 mg/hr patch APPLY 1 PATCH TOPICALLY ONCE DAILY 30 patch 0   pantoprazole  (PROTONIX ) 40 MG tablet Take 1 tablet (40 mg total) by mouth daily. 30 tablet 3   SSD 1 % cream APPLY  CREAM EXTERNALLY TO AFFECTED AREA ONCE DAILY 50 g 0   No current facility-administered medications on file prior to visit.   No Known Allergies Family History  Problem Relation Age of Onset   Hypertension Mother    Diabetes Mother    Cancer Mother    Prostate cancer Father    PE: BP 120/70   Pulse 89   Ht 6' (1.829 m)   Wt 183 lb (83 kg)   SpO2 98%   BMI 24.82 kg/m  Wt Readings from Last 3 Encounters:  02/20/24 183 lb (83 kg)  12/10/23 182 lb (82.6 kg)  08/08/23 179 lb 6.4 oz (81.4 kg)   Constitutional: normal weight, in NAD Eyes: no exophthalmos ENT: + L>R thyromegaly, no cervical lymphadenopathy Cardiovascular: RRR, No MRG Respiratory: CTA B Musculoskeletal: + B leg amputations: R AKA, L transmetatarsal amputation Skin: no rashes Neurological: no tremor with outstretched hands  ASSESSMENT: 1. DM1, uncontrolled, with complications - PAD - s/p leg amputations - R BKA 2017, L TMT amputations 2016 - PN - CKD - DR  2. HL  3.  Thyromegaly  PLAN:  1. Patient with longstanding, uncontrolled, type  1 diabetes, on basal-bolus insulin  regimen, who returns at last visit after long absence of 11 months.  HbA1c was increased to 8.9%, previously 7.1%.  Reviewing the CGM trends, sugars are higher but he also did not have as many and as severe lows compared to the previous visit.  Sugars were increased during the  day, almost entirely elevated after lunch and also, most sugars above target after dinner.  He still had some lows midday and also in early morning.  He was working slightly different hours than before, but still working a late shift.  He was planning to change this.  We increased the Lantus  at that time and, since sugars were still elevated, I advised him to move it midday, to avoid further drops in the blood sugars overnight.  He appears to need more insulin  before lunch so I advised him to increase the NovoLog  before this meal. - I previously recommended a referral to nutrition/diabetes education but he did not go. - At today's visit, he is out of the sensor as he now has another insurance with a high co-pay and was not able to obtain it.  However, he does plan to start back on this.  I recommended to check into the freestyle libre 3+ sensor, also, since he previously had the Dexcom, but he would prefer to stay on the Dexcom sensor.  We gave him a sample sensor today. - Reportedly, sugars are quite high at home, mostly in the 200s with few exceptions.  She is on a disproportionate dose of basal-bolus insulin  so at today's visit I advised him to increase the dose of Basaglar  at night and also add a lower dose in the morning to cover him better during the day.  I did advise him to increase the dose gradually.  Will also increase the dose of insulin  with his meals.  - At today's visit we discussed that if he misses any more appointments, unfortunately, we will need to discharge him from the practice.  He feels that now that he is working days, he should be better able to keep the appointments. - I suggested to:  Patient Instructions  Please increase: - Basaglar  15 units at night and 10 units in am - Novolog  - 15 min before you start eating 10-12 units before b'fast 10-12 units before lunch 10-12 units before dinner 3-4 units before a snack 1-2 units for correction   Please return in 1.5 months.  -  we checked his HbA1c: 10.7% (much higher!) - advised to check sugars at different times of the day - 4x a day, rotating check times - advised for yearly eye exams >> he is UTD - at last visit, his ACR was slightly elevated, at 33, possibly in setting of uncontrolled diabetes.  We will repeat this at next visit, when sugars are hopefully improved - return to clinic in 1.5 months  2. HL - Latest lipid panel showed fractions at goal: Lab Results  Component Value Date   CHOL 132 08/08/2023   HDL 73 08/08/2023   LDLCALC 38 08/08/2023   TRIG 124 08/08/2023   CHOLHDL 1.8 08/08/2023  - He continues on Lipitor 20 mg daily without side effects  3. Thyromegaly - Detected on palpation in 02/2022.  Left lobe was more prominent than the right at that time - I subsequently ordered another ultrasound but he still did not have this checked as he mentions that he did not have time.  At last visit he declined a  new ultrasound.   - Will continue to follow him clinically for this  Lela Fendt, MD PhD [facility]

## 2024-02-25 NOTE — Telephone Encounter (Signed)
 Pharmacy Patient Advocate Encounter  Received notification from [employer] that Prior Authorization for Dexcom G7 sensor has been APPROVED from 12/09/2023 to 12/08/2024   PA #/Case ID/Reference #: [id number]

## 2024-03-31 ENCOUNTER — Ambulatory Visit: Admitting: Internal Medicine
